# Patient Record
Sex: Female | Born: 1975 | Race: White | Hispanic: No | Smoking: Former smoker
Health system: Southern US, Community
[De-identification: ages and names within clinical notes are randomized; demographics above are authoritative.]

## PROBLEM LIST (undated history)

## (undated) DIAGNOSIS — E785 Hyperlipidemia, unspecified: Secondary | ICD-10-CM

## (undated) DIAGNOSIS — I469 Cardiac arrest, cause unspecified: Secondary | ICD-10-CM

## (undated) DIAGNOSIS — F1910 Other psychoactive substance abuse, uncomplicated: Secondary | ICD-10-CM

## (undated) DIAGNOSIS — F419 Anxiety disorder, unspecified: Secondary | ICD-10-CM

## (undated) DIAGNOSIS — F319 Bipolar disorder, unspecified: Secondary | ICD-10-CM

## (undated) DIAGNOSIS — M1990 Unspecified osteoarthritis, unspecified site: Secondary | ICD-10-CM

## (undated) DIAGNOSIS — F1490 Cocaine use, unspecified, uncomplicated: Secondary | ICD-10-CM

## (undated) DIAGNOSIS — G8929 Other chronic pain: Secondary | ICD-10-CM

## (undated) DIAGNOSIS — G459 Transient cerebral ischemic attack, unspecified: Secondary | ICD-10-CM

## (undated) DIAGNOSIS — F32A Depression, unspecified: Secondary | ICD-10-CM

## (undated) DIAGNOSIS — K219 Gastro-esophageal reflux disease without esophagitis: Secondary | ICD-10-CM

## (undated) DIAGNOSIS — I10 Essential (primary) hypertension: Secondary | ICD-10-CM

## (undated) DIAGNOSIS — M419 Scoliosis, unspecified: Secondary | ICD-10-CM

## (undated) DIAGNOSIS — A498 Other bacterial infections of unspecified site: Secondary | ICD-10-CM

## (undated) DIAGNOSIS — F909 Attention-deficit hyperactivity disorder, unspecified type: Secondary | ICD-10-CM

## (undated) DIAGNOSIS — F329 Major depressive disorder, single episode, unspecified: Secondary | ICD-10-CM

## (undated) DIAGNOSIS — G43909 Migraine, unspecified, not intractable, without status migrainosus: Secondary | ICD-10-CM

## (undated) DIAGNOSIS — Z951 Presence of aortocoronary bypass graft: Secondary | ICD-10-CM

## (undated) DIAGNOSIS — U071 COVID-19: Secondary | ICD-10-CM

## (undated) DIAGNOSIS — Z22322 Carrier or suspected carrier of Methicillin resistant Staphylococcus aureus: Secondary | ICD-10-CM

## (undated) DIAGNOSIS — F603 Borderline personality disorder: Secondary | ICD-10-CM

## (undated) DIAGNOSIS — I2510 Atherosclerotic heart disease of native coronary artery without angina pectoris: Secondary | ICD-10-CM

## (undated) DIAGNOSIS — R570 Cardiogenic shock: Secondary | ICD-10-CM

## (undated) HISTORY — DX: Other chronic pain: G89.29

## (undated) HISTORY — DX: Hyperlipidemia, unspecified: E78.5

## (undated) HISTORY — PX: WISDOM TOOTH EXTRACTION: SHX21

## (undated) HISTORY — DX: Carrier or suspected carrier of methicillin resistant Staphylococcus aureus: Z22.322

## (undated) HISTORY — PX: MULTIPLE TOOTH EXTRACTIONS: SHX2053

## (undated) HISTORY — DX: Other bacterial infections of unspecified site: A49.8

## (undated) HISTORY — DX: Cocaine use, unspecified, uncomplicated: F14.90

## (undated) HISTORY — PX: NOSE SURGERY: SHX723

---

## 2000-03-16 ENCOUNTER — Emergency Department (HOSPITAL_COMMUNITY): Admission: EM | Admit: 2000-03-16 | Discharge: 2000-03-16 | Payer: Self-pay

## 2004-02-29 ENCOUNTER — Emergency Department (HOSPITAL_COMMUNITY): Admission: AD | Admit: 2004-02-29 | Discharge: 2004-02-29 | Payer: Self-pay | Admitting: Family Medicine

## 2004-05-28 ENCOUNTER — Emergency Department (HOSPITAL_COMMUNITY): Admission: EM | Admit: 2004-05-28 | Discharge: 2004-05-28 | Payer: Self-pay | Admitting: Emergency Medicine

## 2005-09-05 ENCOUNTER — Emergency Department (HOSPITAL_COMMUNITY): Admission: EM | Admit: 2005-09-05 | Discharge: 2005-09-05 | Payer: Self-pay | Admitting: *Deleted

## 2006-02-24 ENCOUNTER — Emergency Department (HOSPITAL_COMMUNITY): Admission: EM | Admit: 2006-02-24 | Discharge: 2006-02-24 | Payer: Self-pay | Admitting: Emergency Medicine

## 2006-05-24 ENCOUNTER — Emergency Department (HOSPITAL_COMMUNITY): Admission: EM | Admit: 2006-05-24 | Discharge: 2006-05-24 | Payer: Self-pay | Admitting: Emergency Medicine

## 2007-04-08 ENCOUNTER — Emergency Department (HOSPITAL_COMMUNITY): Admission: EM | Admit: 2007-04-08 | Discharge: 2007-04-08 | Payer: Self-pay | Admitting: Emergency Medicine

## 2008-06-06 ENCOUNTER — Emergency Department (HOSPITAL_COMMUNITY): Admission: EM | Admit: 2008-06-06 | Discharge: 2008-06-06 | Payer: Self-pay | Admitting: Emergency Medicine

## 2008-06-30 ENCOUNTER — Emergency Department (HOSPITAL_COMMUNITY): Admission: EM | Admit: 2008-06-30 | Discharge: 2008-07-01 | Payer: Self-pay | Admitting: Emergency Medicine

## 2008-11-19 ENCOUNTER — Emergency Department (HOSPITAL_COMMUNITY): Admission: EM | Admit: 2008-11-19 | Discharge: 2008-11-19 | Payer: Self-pay | Admitting: Emergency Medicine

## 2010-01-02 ENCOUNTER — Emergency Department (HOSPITAL_COMMUNITY): Admission: EM | Admit: 2010-01-02 | Discharge: 2010-01-02 | Payer: Self-pay | Admitting: Emergency Medicine

## 2010-03-09 ENCOUNTER — Emergency Department (HOSPITAL_COMMUNITY): Admission: EM | Admit: 2010-03-09 | Discharge: 2010-03-09 | Payer: Self-pay | Admitting: Emergency Medicine

## 2010-08-08 ENCOUNTER — Emergency Department (HOSPITAL_COMMUNITY): Admission: EM | Admit: 2010-08-08 | Discharge: 2010-08-08 | Payer: Self-pay | Admitting: Emergency Medicine

## 2010-08-18 ENCOUNTER — Emergency Department (HOSPITAL_COMMUNITY): Admission: EM | Admit: 2010-08-18 | Discharge: 2010-08-18 | Payer: Self-pay | Admitting: Emergency Medicine

## 2011-02-27 LAB — POCT PREGNANCY, URINE: Preg Test, Ur: NEGATIVE

## 2011-02-27 LAB — URINALYSIS, ROUTINE W REFLEX MICROSCOPIC
Ketones, ur: 15 mg/dL — AB
Protein, ur: NEGATIVE mg/dL
Specific Gravity, Urine: 1.022 (ref 1.005–1.030)

## 2011-02-27 LAB — URINE MICROSCOPIC-ADD ON

## 2011-03-09 LAB — URINALYSIS, ROUTINE W REFLEX MICROSCOPIC
Nitrite: NEGATIVE
Urobilinogen, UA: 0.2 mg/dL (ref 0.0–1.0)

## 2011-08-22 ENCOUNTER — Emergency Department (HOSPITAL_COMMUNITY)
Admission: EM | Admit: 2011-08-22 | Discharge: 2011-08-22 | Disposition: A | Discharge status: Home / Self Care | Payer: Self-pay | Attending: Emergency Medicine | Admitting: Emergency Medicine

## 2011-08-22 DIAGNOSIS — M545 Low back pain, unspecified: Secondary | ICD-10-CM | POA: Insufficient documentation

## 2011-08-22 DIAGNOSIS — M1990 Unspecified osteoarthritis, unspecified site: Secondary | ICD-10-CM | POA: Insufficient documentation

## 2011-08-22 DIAGNOSIS — F319 Bipolar disorder, unspecified: Secondary | ICD-10-CM | POA: Insufficient documentation

## 2011-08-22 DIAGNOSIS — S335XXA Sprain of ligaments of lumbar spine, initial encounter: Secondary | ICD-10-CM | POA: Insufficient documentation

## 2011-08-22 DIAGNOSIS — X500XXA Overexertion from strenuous movement or load, initial encounter: Secondary | ICD-10-CM | POA: Insufficient documentation

## 2011-08-22 DIAGNOSIS — Z79899 Other long term (current) drug therapy: Secondary | ICD-10-CM | POA: Insufficient documentation

## 2011-10-12 ENCOUNTER — Emergency Department (HOSPITAL_COMMUNITY): Payer: Self-pay

## 2011-10-12 ENCOUNTER — Emergency Department (HOSPITAL_COMMUNITY)
Admission: EM | Admit: 2011-10-12 | Discharge: 2011-10-12 | Disposition: A | Discharge status: Home / Self Care | Payer: Self-pay | Attending: Emergency Medicine | Admitting: Emergency Medicine

## 2011-10-12 DIAGNOSIS — F319 Bipolar disorder, unspecified: Secondary | ICD-10-CM | POA: Insufficient documentation

## 2011-10-12 DIAGNOSIS — M79609 Pain in unspecified limb: Secondary | ICD-10-CM | POA: Insufficient documentation

## 2011-10-12 DIAGNOSIS — W268XXA Contact with other sharp object(s), not elsewhere classified, initial encounter: Secondary | ICD-10-CM | POA: Insufficient documentation

## 2011-10-12 DIAGNOSIS — W01119A Fall on same level from slipping, tripping and stumbling with subsequent striking against unspecified sharp object, initial encounter: Secondary | ICD-10-CM | POA: Insufficient documentation

## 2011-10-12 DIAGNOSIS — S51809A Unspecified open wound of unspecified forearm, initial encounter: Secondary | ICD-10-CM | POA: Insufficient documentation

## 2011-10-12 DIAGNOSIS — M1990 Unspecified osteoarthritis, unspecified site: Secondary | ICD-10-CM | POA: Insufficient documentation

## 2011-10-22 ENCOUNTER — Emergency Department (HOSPITAL_COMMUNITY)
Admission: EM | Admit: 2011-10-22 | Discharge: 2011-10-22 | Disposition: A | Discharge status: Home / Self Care | Payer: Self-pay | Attending: Emergency Medicine | Admitting: Emergency Medicine

## 2011-10-22 DIAGNOSIS — F341 Dysthymic disorder: Secondary | ICD-10-CM | POA: Insufficient documentation

## 2011-10-22 DIAGNOSIS — F17200 Nicotine dependence, unspecified, uncomplicated: Secondary | ICD-10-CM | POA: Insufficient documentation

## 2011-10-22 DIAGNOSIS — M545 Low back pain, unspecified: Secondary | ICD-10-CM | POA: Insufficient documentation

## 2011-10-22 DIAGNOSIS — Z79899 Other long term (current) drug therapy: Secondary | ICD-10-CM | POA: Insufficient documentation

## 2011-10-22 DIAGNOSIS — F319 Bipolar disorder, unspecified: Secondary | ICD-10-CM | POA: Insufficient documentation

## 2011-10-22 DIAGNOSIS — Z4802 Encounter for removal of sutures: Secondary | ICD-10-CM | POA: Insufficient documentation

## 2011-10-22 DIAGNOSIS — M549 Dorsalgia, unspecified: Secondary | ICD-10-CM

## 2011-10-22 HISTORY — DX: Bipolar disorder, unspecified: F31.9

## 2011-10-22 HISTORY — DX: Anxiety disorder, unspecified: F41.9

## 2011-10-22 HISTORY — DX: Major depressive disorder, single episode, unspecified: F32.9

## 2011-10-22 HISTORY — DX: Depression, unspecified: F32.A

## 2011-10-22 MED ORDER — CYCLOBENZAPRINE HCL 10 MG PO TABS: 5.0000 mg | ORAL_TABLET | Freq: Two times a day (BID) | ORAL | Status: AC | PRN

## 2011-10-22 MED ORDER — IBUPROFEN 200 MG PO TABS
400.0000 mg | ORAL_TABLET | Freq: Once | ORAL | Status: DC
  Filled 2011-10-22: qty 2
  Filled 2011-10-22: qty 1

## 2011-10-22 MED ORDER — CYCLOBENZAPRINE HCL 10 MG PO TABS
5.0000 mg | ORAL_TABLET | Freq: Once | ORAL | Status: AC
  Administered 2011-10-22: 5 mg via ORAL
  Filled 2011-10-22: qty 1

## 2011-10-22 NOTE — ED Notes (Signed)
Here for suture removal and back pain, sts fell 1 week ago Saturday and sutures on right forearm.

## 2011-10-22 NOTE — ED Provider Notes (Signed)
History     CSN: [id number] Arrival date & time: 10/22/2011  9:43 AM   First MD Initiated Contact with Patient 10/22/11 1123      Chief Complaint  Patient presents with  . Suture / Staple Removal  . Back Pain   Complains of low nonradiating back pain onset 1.5 weeks ago after falling down steps patient was seen here for same complaint and had right forearm sutured as result of fall. Prescribe Flexeril, which she has run out of. has also been taking Tylenol . She presents today with continued low back pain and requesting suture removal from right forearm. Back pain nonradiating worse with movement no other complaint (Consider location/radiation/quality/duration/timing/severity/associated sxs/prior treatment) HPI  Past Medical History  Diagnosis Date  . Depression   . Anxiety   . Bipolar 1 disorder     History reviewed. No pertinent past surgical history.  History reviewed. No pertinent family history.  History  Substance Use Topics  . Smoking status: Current Everyday Smoker -- 1.0 packs/day    Types: Cigarettes  . Smokeless tobacco: Not on file  . Alcohol Use: Yes     once a month    OB History    Grav Para Term Preterm Abortions TAB SAB Ect Mult Living                  Review of Systems  Constitutional: Negative.   HENT: Negative.   Respiratory: Negative.   Cardiovascular: Negative.   Gastrointestinal: Negative.   Musculoskeletal: Positive for back pain.  Skin:       Laceration right forearm  Neurological: Negative.   Hematological: Negative.   Psychiatric/Behavioral: Negative.     Allergies  Review of patient's allergies indicates no known allergies.  Home Medications   Current Outpatient Rx  Name Route Sig Dispense Refill  . ARIPIPRAZOLE 10 MG PO TABS Oral Take 10 mg by mouth daily.      [name] DIAZEPAM 5 MG PO TABS Oral Take 2.5 mg by mouth 2 (two) times daily as needed. For anxiety       BP 113/80  Pulse 66  Temp(Src) 97.8 F (36.6 C) (Oral)   Resp 14  SpO2 100%  LMP 10/15/2011  Physical Exam  Nursing note and vitals reviewed. Constitutional: She appears well-developed and well-nourished.  HENT:  Head: Normocephalic and atraumatic.  Eyes: Conjunctivae are normal. Pupils are equal, round, and reactive to light.  Neck: Normal range of motion. Neck supple. No tracheal deviation present. No thyromegaly present.  Cardiovascular: Normal rate and regular rhythm.   No murmur heard. Pulmonary/Chest: Effort normal and breath sounds normal.  Abdominal: Soft. Bowel sounds are normal. She exhibits no distension. There is no tenderness.  Musculoskeletal: Normal range of motion. She exhibits no edema and no tenderness.       Entire spine nontender  Neurological: She is alert. Coordination normal.       Gait normal  Skin: Skin is warm and dry. No rash noted.       Sutured laceration right volar forearm no surrounding redness tenderness or discharge from wound.  Psychiatric: She has a normal mood and affect.    ED Course  Procedures (including critical care time)  Labs Reviewed - No data to display No results found.   No diagnosis found.    MDM  No signs of infection. Plan prescription for Flexeril Tylenol or ibuprofen for pain . Narcotic pain prescription not written due to possible interaction benzodiazepine and alcohol and potential for  abuse. Sutures to remove be removed by nurse   Dx#1 back pain #2 well-healing laceration right forearm   Doug Sou, MD 10/22/11 1134

## 2012-05-17 ENCOUNTER — Emergency Department (HOSPITAL_COMMUNITY)
Admission: EM | Admit: 2012-05-17 | Discharge: 2012-05-17 | Disposition: A | Discharge status: Home / Self Care | Payer: Self-pay | Attending: Emergency Medicine | Admitting: Emergency Medicine

## 2012-05-17 ENCOUNTER — Encounter (HOSPITAL_COMMUNITY): Payer: Self-pay | Admitting: Emergency Medicine

## 2012-05-17 DIAGNOSIS — M545 Low back pain, unspecified: Secondary | ICD-10-CM | POA: Insufficient documentation

## 2012-05-17 DIAGNOSIS — M4120 Other idiopathic scoliosis, site unspecified: Secondary | ICD-10-CM | POA: Insufficient documentation

## 2012-05-17 DIAGNOSIS — F411 Generalized anxiety disorder: Secondary | ICD-10-CM | POA: Insufficient documentation

## 2012-05-17 DIAGNOSIS — F17200 Nicotine dependence, unspecified, uncomplicated: Secondary | ICD-10-CM | POA: Insufficient documentation

## 2012-05-17 DIAGNOSIS — F319 Bipolar disorder, unspecified: Secondary | ICD-10-CM | POA: Insufficient documentation

## 2012-05-17 HISTORY — DX: Scoliosis, unspecified: M41.9

## 2012-05-17 MED ORDER — HYDROCODONE-ACETAMINOPHEN 5-325 MG PO TABS: 1.0000 | ORAL_TABLET | ORAL | Status: AC | PRN

## 2012-05-17 MED ORDER — METHOCARBAMOL 500 MG PO TABS
1000.0000 mg | ORAL_TABLET | Freq: Once | ORAL | Status: AC
  Administered 2012-05-17: 1000 mg via ORAL
  Filled 2012-05-17: qty 2

## 2012-05-17 MED ORDER — IBUPROFEN 600 MG PO TABS: 600.0000 mg | ORAL_TABLET | Freq: Four times a day (QID) | ORAL | Status: AC | PRN

## 2012-05-17 MED ORDER — IBUPROFEN 200 MG PO TABS
600.0000 mg | ORAL_TABLET | Freq: Once | ORAL | Status: AC
  Administered 2012-05-17: 600 mg via ORAL
  Filled 2012-05-17: qty 3

## 2012-05-17 MED ORDER — HYDROCODONE-ACETAMINOPHEN 5-325 MG PO TABS
1.0000 | ORAL_TABLET | Freq: Once | ORAL | Status: AC
  Administered 2012-05-17: 1 via ORAL
  Filled 2012-05-17: qty 1

## 2012-05-17 MED ORDER — METHOCARBAMOL 500 MG PO TABS: 500.0000 mg | ORAL_TABLET | Freq: Two times a day (BID) | ORAL | Status: AC

## 2012-05-17 NOTE — ED Provider Notes (Signed)
History  This chart was scribed for Christina Racer, MD by Bennett Scrape. This patient was seen in room STRE7/STRE7 and the patient's care was started at 2:28PM.  CSN: [id number]  Arrival date & time 05/17/12  1427   First MD Initiated Contact with Patient 05/17/12 1448      Chief Complaint  Patient presents with  . Back Pain    Patient is a 36 y.o. female presenting with back pain. The history is provided by the patient. No language interpreter was used.  Back Pain  This is a recurrent problem. The current episode started more than 2 days ago. The problem occurs constantly. The problem has been gradually worsening. The pain is associated with no known injury. The pain is present in the lumbar spine. The quality of the pain is described as shooting. Pertinent negatives include no fever, no headaches and no weakness.    Christina Morrison is a 36 y.o. female with a h/o depression, anxiety, bipolar disorder and scoliosis who presents to the Emergency Department complaining of 4 days of gradual onset, gradually worsening, constant lower back pain that occasionally radiates shooting pains up her back that started after she cleaned her house. She denies injury as the cause. She reports having prior episodes of similar symptoms. She reports taking Aleve at home with no improvement in her symptoms. She denies urinary symptoms. She denies loss of bladder or bowels as associated symptoms. She is a current everyday smoker and rare alcohol user.   Past Medical History  Diagnosis Date  . Depression   . Anxiety   . Bipolar 1 disorder   . Scoliosis     History reviewed. No pertinent past surgical history.  No family history on file.  History  Substance Use Topics  . Smoking status: Current Everyday Smoker -- 1.0 packs/day    Types: Cigarettes  . Smokeless tobacco: Not on file  . Alcohol Use: No     once a month     Review of Systems  Constitutional: Negative for fever and chills.    Respiratory: Negative for cough and shortness of breath.   Gastrointestinal: Negative for nausea and vomiting.  Musculoskeletal: Positive for back pain.  Neurological: Negative for weakness and headaches.    Allergies  Review of patient's allergies indicates no known allergies.  Home Medications   Current Outpatient Rx  Name Route Sig Dispense Refill  . ARIPIPRAZOLE 10 MG PO TABS Oral Take 10 mg by mouth daily.      [name] HYDROCODONE-ACETAMINOPHEN 5-325 MG PO TABS Oral Take 1 tablet by mouth every 4 (four) hours as needed for pain. 10 tablet 0  . IBUPROFEN 600 MG PO TABS Oral Take 1 tablet (600 mg total) by mouth every 6 (six) hours as needed for pain. 30 tablet 0  . METHOCARBAMOL 500 MG PO TABS Oral Take 1 tablet (500 mg total) by mouth 2 (two) times daily. 20 tablet 0    Triage Vitals: BP 123/84  Pulse 96  Temp(Src) 98 F (36.7 C) (Oral)  Resp 18  SpO2 100%  Physical Exam  Nursing note and vitals reviewed. Constitutional: She is oriented to person, place, and time. She appears well-developed and well-nourished. No distress.  HENT:  Head: Normocephalic and atraumatic.  Eyes: EOM are normal.  Neck: Neck supple. No tracheal deviation present.  Cardiovascular: Normal rate.   Pulmonary/Chest: Effort normal. No respiratory distress.  Musculoskeletal: Normal range of motion. She exhibits tenderness (mild lumbar paraspinal tenderness).  No motor deficits, normal strength  Neurological: She is alert and oriented to person, place, and time.  Skin: Skin is warm and dry.  Psychiatric: She has a normal mood and affect. Her behavior is normal.    ED Course  Procedures (including critical care time)  DIAGNOSTIC STUDIES: Oxygen Saturation is 100% on room air, normal by my interpretation.    COORDINATION OF CARE: 3:00PM-Discussed treatment plan with pt and pt agreed to plan. 3:54PM-Pt rechecked and is feeling better. Discussed discharged plan with pt and pt agreed to  plan.   Labs Reviewed - No data to display No results found.   1. Lumbar back pain       MDM  I personally performed the services described in this documentation, which was scribed in my presence. The recorded information has been reviewed and considered.        Christina Racer, MD 05/17/12 1924

## 2012-05-17 NOTE — Discharge Instructions (Signed)
Back Pain, Adult Low back pain is very common. About 1 in 5 people have back pain.The cause of low back pain is rarely dangerous. The pain often gets better over time.About half of people with a sudden onset of back pain feel better in just 2 weeks. About 8 in 10 people feel better by 6 weeks.  CAUSES Some common causes of back pain include:  Strain of the muscles or ligaments supporting the spine.   Wear and tear (degeneration) of the spinal discs.   Arthritis.   Direct injury to the back.  DIAGNOSIS Most of the time, the direct cause of low back pain is not known.However, back pain can be treated effectively even when the exact cause of the pain is unknown.Answering your caregiver's questions about your overall health and symptoms is one of the most accurate ways to make sure the cause of your pain is not dangerous. If your caregiver needs more information, he or she may order lab work or imaging tests (X-rays or MRIs).However, even if imaging tests show changes in your back, this usually does not require surgery. HOME CARE INSTRUCTIONS For many people, back pain returns.Since low back pain is rarely dangerous, it is often a condition that people can learn to manageon their own.   Remain active. It is stressful on the back to sit or stand in one place. Do not sit, drive, or stand in one place for more than 30 minutes at a time. Take short walks on level surfaces as soon as pain allows.Try to increase the length of time you walk each day.   Do not stay in bed.Resting more than 1 or 2 days can delay your recovery.   Do not avoid exercise or work.Your body is made to move.It is not dangerous to be active, even though your back may hurt.Your back will likely heal faster if you return to being active before your pain is gone.   Pay attention to your body when you bend and lift. Many people have less discomfortwhen lifting if they bend their knees, keep the load close to their  bodies,and avoid twisting. Often, the most comfortable positions are those that put less stress on your recovering back.   Find a comfortable position to sleep. Use a firm mattress and lie on your side with your knees slightly bent. If you lie on your back, put a pillow under your knees.   Only take over-the-counter or prescription medicines as directed by your caregiver. Over-the-counter medicines to reduce pain and inflammation are often the most helpful.Your caregiver may prescribe muscle relaxant drugs.These medicines help dull your pain so you can more quickly return to your normal activities and healthy exercise.   Put ice on the injured area.   Put ice in a plastic bag.   Place a towel between your skin and the bag.   Leave the ice on for 15 to 20 minutes, 3 to 4 times a day for the first 2 to 3 days. After that, ice and heat may be alternated to reduce pain and spasms.   Ask your caregiver about trying back exercises and gentle massage. This may be of some benefit.   Avoid feeling anxious or stressed.Stress increases muscle tension and can worsen back pain.It is important to recognize when you are anxious or stressed and learn ways to manage it.Exercise is a great option.  SEEK MEDICAL CARE IF:  You have pain that is not relieved with rest or medicine.   You have   pain that does not improve in 1 week.   You have new symptoms.   You are generally not feeling well.  SEEK IMMEDIATE MEDICAL CARE IF:   You have pain that radiates from your back into your legs.   You develop new bowel or bladder control problems.   You have unusual weakness or numbness in your arms or legs.   You develop nausea or vomiting.   You develop abdominal pain.   You feel faint.  Document Released: 12/01/2005 Document Revised: 11/20/2011 Document Reviewed: 04/21/2011 [employer] Patient Information 2012 [employer]. 

## 2012-05-17 NOTE — ED Notes (Signed)
Onset 4 days ago lower back pain history of back pain. Denies any trauma.  Pain currently 9/10 achy sharp pain intermittent nausea.

## 2012-05-29 ENCOUNTER — Emergency Department (HOSPITAL_COMMUNITY)
Admission: EM | Admit: 2012-05-29 | Discharge: 2012-05-30 | Disposition: A | Discharge status: Home / Self Care | Payer: Self-pay | Attending: Emergency Medicine | Admitting: Emergency Medicine

## 2012-05-29 DIAGNOSIS — F319 Bipolar disorder, unspecified: Secondary | ICD-10-CM | POA: Insufficient documentation

## 2012-05-29 DIAGNOSIS — F411 Generalized anxiety disorder: Secondary | ICD-10-CM | POA: Insufficient documentation

## 2012-05-29 DIAGNOSIS — R51 Headache: Secondary | ICD-10-CM | POA: Insufficient documentation

## 2012-05-29 DIAGNOSIS — F17200 Nicotine dependence, unspecified, uncomplicated: Secondary | ICD-10-CM | POA: Insufficient documentation

## 2012-05-30 ENCOUNTER — Encounter (HOSPITAL_COMMUNITY): Payer: Self-pay | Admitting: Emergency Medicine

## 2012-05-30 MED ORDER — NAPROXEN 500 MG PO TABS: 500.0000 mg | ORAL_TABLET | Freq: Two times a day (BID) | ORAL | Status: DC

## 2012-05-30 MED ORDER — ONDANSETRON 4 MG PO TBDP
ORAL_TABLET | ORAL | Status: AC
  Administered 2012-05-30: 4 mg
  Filled 2012-05-30: qty 1

## 2012-05-30 MED ORDER — OXYCODONE-ACETAMINOPHEN 5-325 MG PO TABS
2.0000 | ORAL_TABLET | Freq: Once | ORAL | Status: AC
  Administered 2012-05-30: 2 via ORAL
  Filled 2012-05-30: qty 2

## 2012-05-30 MED ORDER — OXYCODONE-ACETAMINOPHEN 5-325 MG PO TABS: 1.0000 | ORAL_TABLET | ORAL | Status: DC | PRN

## 2012-05-30 NOTE — ED Provider Notes (Signed)
History     CSN: [id number]  Arrival date & time 05/29/12  2345   First MD Initiated Contact with Patient 05/30/12 0044      Chief Complaint  Patient presents with  . Headache    (Consider location/radiation/quality/duration/timing/severity/associated sxs/prior treatment) HPI Comments: Patient is a 36 year old female who presents with a headache that has been ongoing for 4 days. She states it is a pressure sensation in a bandlike distribution across her forehead and bitemporal area. The pain radiates across to the back of her head and down in her neck. She has no pain with range of motion of her neck, no fevers, no changes in vision, no numbness, weakness, ataxia. She does state that the headache is intermittent, is not responding to ibuprofen at home. She does not usually get headaches. She denies coughing, fever, shortness of breath, chest pain, belly pain, dysuria though she does note having diarrhea and myalgias yesterday which resolved. Currently her headache is moderate and seems to get worse when she stands up.  Patient is a 36 y.o. female presenting with headaches. The history is provided by the patient and the spouse.  Headache     Past Medical History  Diagnosis Date  . Depression   . Anxiety   . Bipolar 1 disorder   . Scoliosis     History reviewed. No pertinent past surgical history.  History reviewed. No pertinent family history.  History  Substance Use Topics  . Smoking status: Current Everyday Smoker -- 1.0 packs/day    Types: Cigarettes  . Smokeless tobacco: Not on file  . Alcohol Use: No     once a month    OB History    Grav Para Term Preterm Abortions TAB SAB Ect Mult Living                  Review of Systems  Neurological: Positive for headaches.  All other systems reviewed and are negative.    Allergies  Review of patient's allergies indicates no known allergies.  Home Medications   Current Outpatient Rx  Name Route Sig Dispense Refill   . IBUPROFEN 200 MG PO TABS Oral Take 400 mg by mouth every 6 (six) hours as needed. Pain or fever    . NAPROXEN 500 MG PO TABS Oral Take 1 tablet (500 mg total) by mouth 2 (two) times daily with a meal. 30 tablet 0  . OXYCODONE-ACETAMINOPHEN 5-325 MG PO TABS Oral Take 1 tablet by mouth every 4 (four) hours as needed for pain. May take 2 tablets PO q 6 hours for severe pain - Do not take with Tylenol as this tablet already contains tylenol 15 tablet 0    BP 138/80  Pulse 85  Temp 98.3 F (36.8 C) (Oral)  Resp 18  SpO2 100%  LMP 04/14/2012  Physical Exam  Nursing note and vitals reviewed. Constitutional: She appears well-developed and well-nourished. No distress.  HENT:  Head: Normocephalic and atraumatic.  Mouth/Throat: Oropharynx is clear and moist. No oropharyngeal exudate.  Eyes: Conjunctivae and EOM are normal. Pupils are equal, round, and reactive to light. Right eye exhibits no discharge. Left eye exhibits no discharge. No scleral icterus.  Neck: Normal range of motion. Neck supple. No JVD present. No thyromegaly present.  Cardiovascular: Normal rate, regular rhythm, normal heart sounds and intact distal pulses.  Exam reveals no gallop and no friction rub.   No murmur heard. Pulmonary/Chest: Effort normal and breath sounds normal. No respiratory distress. She has no wheezes.  She has no rales.  Abdominal: Soft. Bowel sounds are normal. She exhibits no distension and no mass. There is no tenderness.  Musculoskeletal: Normal range of motion. She exhibits no edema and no tenderness.  Lymphadenopathy:    She has no cervical adenopathy.  Neurological: She is alert. Coordination normal.       Neurologic exam:  Speech clear, pupils equal round reactive to light, extraocular movements intact  Normal peripheral visual fields Cranial nerves III through XII normal including no facial droop Follows commands, moves all extremities x4, normal strength to bilateral upper and lower  extremities at all major muscle groups including grip Sensation normal to light touch and pinprick Coordination intact, no limb ataxia, finger-nose-finger normal Rapid alternating movements normal No pronator drift Gait normal   Skin: Skin is warm and dry. No rash noted. No erythema.  Psychiatric: She has a normal mood and affect. Her behavior is normal.    ED Course  Procedures (including critical care time)  Labs Reviewed - No data to display No results found.   1. Headache       MDM  Physical exam is benign, patient has a headache consistent with tension headache, there is no focal neurologic deficits, no fever, no stiff neck, doubt significant secondary source of headache, likely primary tension headache. Patient has declined IV and intramuscular medications but has accepted oral medications, will give anti-inflammatory, Percocet, home with NSAIDs.  Discharge Prescriptions include:  Naprosyn Percocet         Vida Roller, MD 05/30/12 [phone]

## 2012-05-30 NOTE — ED Notes (Signed)
Pt denies any questions upon discharge, pt medicated from pain prior to discharge.

## 2012-05-30 NOTE — Discharge Instructions (Signed)
Headache:  You are having a headache. No specific cause was found today for your headache. It may have been a migraine or other cause of headache. Stress, anxiety, fatigue, and depression are common triggers for headaches. Your headache today does not appear to be life-threatening or require hospitalization, but often the exact cause of headaches is not determined in the emergency department. Therefore, followup with your doctor is very important to find out what may have caused your headache, and whether or not you need any further diagnostic testing or treatment. Sometimes headaches can appear benign but then more serious symptoms can develop which should prompt an immediate reevaluation by your doctor or the emergency department.  Seek immediate medical attention if:  You develop possible problems with medications prescribed. The medications don't resolve your headache, if it recurs, or if you have multiple episodes of vomiting or can't take fluids by mouth You have a change from the usual headache. If you developed a sudden severe headache or confusion, become poorly responsive or faint, developed a fever above 100.4 or problems breathing, have a change in speech, vision, swallowing or understanding, or developed new weakness, numbness, tingling, incoordination or have a seizure.  If you don't have a family doctor to follow up with, see the follow up list below - call this morning for a follow-up appointment in the next 1-2 days.  RESOURCE GUIDE  Dental Problems  Patients with Medicaid: [facility]                     [facility] [address]                                           [address] Phone:  [phone]                                                  Phone:  [phone]  If unable to pay or uninsured, contact:  [facility] or [facility] to become qualified for the adult dental clinic.  Chronic Pain Problems Contact [facility]  [phone] Patients need to be referred by their primary care doctor.  Insufficient Money for Medicine Contact [employer]:  call "211" or [facility] 271-5999.  No Primary Care Doctor Call [facility]  832-8000 Other agencies that provide inexpensive medical care    [facility]  832-8035    [facility]  832-7272    [facility]  [phone]    Women's Clinic  832-4777    [facility]  [phone]    [facility]  272-1050  Psychological Services [facility]  [phone] [facility]  [phone] [facility]   800 853-5163 (emergency services 641-4993)  Substance Abuse Resources [facility]  336-882-2125 [facility] 336-784-9470 [facility] 336-285-9073 [facility] 336-845-3988 Residential & Outpatient Substance Abuse Program  800-659-3381  Abuse/Neglect [place] Child Abuse Hotline (336) 641-3795 [place] Child Abuse Hotline 800-378-5315 (After Hours)  Emergency Shelter [facility] (336) 271-5985  [facility] (336) 275-9566 [facility] (704) 372-4663  MRSA Hotline #:     832-7006    [place] Resources  [facility] [place]     [employer]                          [facility] [address] [place]                       [address]      [address]  [place]                                                [place]                            [place] Phone:  [phone]                                   Phone:  [phone]                 Phone:  [phone]  [facility] Phone:  342-8316  [place] Child Abuse Hotline (336) 342-1394 (336) 342-3537 (After Hours)   

## 2012-05-30 NOTE — ED Notes (Addendum)
Patient complaining of headache; reports dizziness and nausea.  Took blood pressure before coming to ED -- 141/106.  Denies light sensitivity and blurred vision.  Patient denies weakness, shortness of breath, and chest pain.

## 2012-05-31 ENCOUNTER — Emergency Department (HOSPITAL_COMMUNITY)
Admission: EM | Admit: 2012-05-31 | Discharge: 2012-06-01 | Disposition: A | Discharge status: Home / Self Care | Payer: Self-pay | Attending: Emergency Medicine | Admitting: Emergency Medicine

## 2012-05-31 ENCOUNTER — Encounter (HOSPITAL_COMMUNITY): Payer: Self-pay | Admitting: Emergency Medicine

## 2012-05-31 DIAGNOSIS — F17200 Nicotine dependence, unspecified, uncomplicated: Secondary | ICD-10-CM | POA: Insufficient documentation

## 2012-05-31 DIAGNOSIS — F319 Bipolar disorder, unspecified: Secondary | ICD-10-CM | POA: Insufficient documentation

## 2012-05-31 DIAGNOSIS — M4120 Other idiopathic scoliosis, site unspecified: Secondary | ICD-10-CM | POA: Insufficient documentation

## 2012-05-31 DIAGNOSIS — G44209 Tension-type headache, unspecified, not intractable: Secondary | ICD-10-CM | POA: Insufficient documentation

## 2012-05-31 HISTORY — DX: Migraine, unspecified, not intractable, without status migrainosus: G43.909

## 2012-05-31 NOTE — ED Notes (Signed)
PT. REPORTS PERSISTENT HEADACHE SEEN HERE 2 DAYS AGO DIAGNOSED WITH " TENSION MIGRAINE" PRESCRIBED WITH PERCOCET AND NAPROXEN WITH NO RELIEF , PT. ALSO REPORTS NAUSEA/VOMITTING .

## 2012-06-01 MED ORDER — HYDROMORPHONE HCL PF 1 MG/ML IJ SOLN
1.0000 mg | Freq: Once | INTRAMUSCULAR | Status: AC
  Administered 2012-06-01: 1 mg via INTRAMUSCULAR
  Filled 2012-06-01: qty 1

## 2012-06-01 MED ORDER — DIAZEPAM 5 MG/ML IJ SOLN
5.0000 mg | Freq: Once | INTRAMUSCULAR | Status: AC
  Administered 2012-06-01: 5 mg via INTRAMUSCULAR
  Filled 2012-06-01: qty 2

## 2012-06-01 MED ORDER — OXYCODONE-ACETAMINOPHEN 5-325 MG PO TABS: 1.0000 | ORAL_TABLET | ORAL | Status: AC | PRN

## 2012-06-01 MED ORDER — PROMETHAZINE HCL 25 MG PO TABS: 25.0000 mg | ORAL_TABLET | Freq: Four times a day (QID) | ORAL | Status: DC | PRN

## 2012-06-01 NOTE — ED Provider Notes (Signed)
History     CSN: [id number]  Arrival date & time 05/31/12  2236   First MD Initiated Contact with Patient 06/01/12 0103      Chief Complaint  Patient presents with  . Migraine    (Consider location/radiation/quality/duration/timing/severity/associated sxs/prior treatment) HPI Comments: Patient returns tonight after having been seen several days ago for headache and having been diagnosed with tension migraine headache - she reports has been taking the medication without improvement in symptoms - she states that she was offered IM medications intially when seen and she refused but she would like to try this now - states no change in the pain, denies fever, chills, neck pain, reports occipital pain with photophobia and nausea without vomiting - denies numbness, tingling, loss of control of bowels or bladder.  Patient is a 36 y.o. female presenting with migraine. The history is provided by the patient. No language interpreter was used.  Migraine This is a recurrent problem. The current episode started in the past 7 days. The problem occurs constantly. The problem has been unchanged. Associated symptoms include headaches and nausea. Pertinent negatives include no abdominal pain, anorexia, arthralgias, change in bowel habit, chest pain, chills, congestion, coughing, diaphoresis, fatigue, joint swelling, myalgias, neck pain, numbness, rash, sore throat, swollen glands, urinary symptoms, vertigo, visual change, vomiting or weakness. The symptoms are aggravated by bending. She has tried nothing for the symptoms. The treatment provided no relief.    Past Medical History  Diagnosis Date  . Depression   . Anxiety   . Bipolar 1 disorder   . Scoliosis   . Migraine     History reviewed. No pertinent past surgical history.  No family history on file.  History  Substance Use Topics  . Smoking status: Current Everyday Smoker -- 1.0 packs/day    Types: Cigarettes  . Smokeless tobacco: Not on file   . Alcohol Use: No     once a month    OB History    Grav Para Term Preterm Abortions TAB SAB Ect Mult Living                  Review of Systems  Constitutional: Negative for chills, diaphoresis and fatigue.  HENT: Negative for congestion, sore throat and neck pain.   Respiratory: Negative for cough.   Cardiovascular: Negative for chest pain.  Gastrointestinal: Positive for nausea. Negative for vomiting, abdominal pain, anorexia and change in bowel habit.  Musculoskeletal: Negative for myalgias, joint swelling and arthralgias.  Skin: Negative for rash.  Neurological: Positive for headaches. Negative for vertigo, weakness and numbness.  All other systems reviewed and are negative.    Allergies  Review of patient's allergies indicates no known allergies.  Home Medications   Current Outpatient Rx  Name Route Sig Dispense Refill  . IBUPROFEN 200 MG PO TABS Oral Take 400 mg by mouth every 6 (six) hours as needed. Pain or fever    . NAPROXEN 500 MG PO TABS Oral Take 1 tablet (500 mg total) by mouth 2 (two) times daily with a meal. 30 tablet 0  . OXYCODONE-ACETAMINOPHEN 5-325 MG PO TABS Oral Take 1 tablet by mouth every 4 (four) hours as needed for pain. May take 2 tablets PO q 6 hours for severe pain - Do not take with Tylenol as this tablet already contains tylenol 15 tablet 0    BP 130/77  Temp 98.3 F (36.8 C) (Oral)  Resp 18  SpO2 100%  LMP 05/31/2012  Physical Exam  Nursing  note and vitals reviewed. Constitutional: She is oriented to person, place, and time. She appears well-developed and well-nourished. No distress.  HENT:  Head: Normocephalic and atraumatic.  Right Ear: External ear normal.  Left Ear: External ear normal.  Nose: Nose normal.  Mouth/Throat: Oropharynx is clear and moist. No oropharyngeal exudate.       Mild occipital scalp ttp  Eyes: Conjunctivae are normal. Pupils are equal, round, and reactive to light. No scleral icterus.  Neck: Normal range  of motion. Neck supple. No spinous process tenderness and no muscular tenderness present.  Cardiovascular: Normal rate, regular rhythm and normal heart sounds.  Exam reveals no gallop and no friction rub.   No murmur heard. Pulmonary/Chest: Breath sounds normal. No respiratory distress. She has no wheezes. She has no rales. She exhibits no tenderness.  Abdominal: Soft. Bowel sounds are normal. She exhibits no distension. There is no tenderness.  Musculoskeletal: Normal range of motion. She exhibits no edema and no tenderness.  Lymphadenopathy:    She has no cervical adenopathy.  Neurological: She is alert and oriented to person, place, and time. No cranial nerve deficit. She exhibits normal muscle tone. Coordination normal.       Normal coordination, gait, strength.  Skin: Skin is warm and dry. No rash noted. No erythema. No pallor.  Psychiatric: She has a normal mood and affect. Her behavior is normal. Judgment and thought content normal.    ED Course  Procedures (including critical care time)  Labs Reviewed - No data to display No results found.   Tension headache    MDM  Patient returns with worsening tension type headache - reports complete relief of pain after dilaudid and valium - will refill pain medication and give pheneregan as well.        Izola Price [place], [place] 06/01/12 [phone]

## 2012-06-01 NOTE — ED Notes (Signed)
Pt ambulated with a steady gait;VSS; A&Ox3; no signs of distress; respirations even and unlabored; skin warm and dry; no questions at this time.  

## 2012-06-01 NOTE — ED Provider Notes (Signed)
Medical screening examination/treatment/procedure(s) were performed by non-physician practitioner and as supervising physician I was immediately available for consultation/collaboration.    Vida Roller, MD 06/01/12 [phone]

## 2012-06-01 NOTE — Discharge Instructions (Signed)
Tension Headache (Muscle Contraction Headache) Tension headache is one of the most common causes of head pain. These headaches are usually felt as a pain over the top of your head and back of your neck. Stress, anxiety, and depression are common triggers for these headaches. Tension headaches are not life-threatening and will not lead to other types of headaches. Tension headaches can often be diagnosed by taking a history from the patient and a physical exam. Sometimes, further lab and x-ray studies are used to confirm the diagnosis. Your caregiver can advise you on how to get help solving problems that cause anxiety or stress. Antidepressants can be prescribed if depression is a problem. HOME CARE INSTRUCTIONS   If testing was done, call for your results. Remember, it is your responsibility to get the results of all testing. Do not assume everything is fine because you do not hear from your caregiver.   Only take over-the-counter or prescription medicines for pain, discomfort, or fever as directed by your caregiver.   Biofeedback, massage, or other relaxation techniques may be helpful.   Ice packs or heat to the head and neck can be used. Use these three to four times per day or as needed.   Physical therapy may be a useful addition to treatment.   If headaches continue, even with therapy, you may need to think about lifestyle changes.   Avoid excessive use of pain killers, as rebound headaches can occur.  SEEK MEDICAL CARE IF:   You develop problems with medications prescribed.   You do not respond or get no relief from medications.   You have a change from the usual headache.   You develop nausea (feeling sick to your stomach) or vomiting.  SEEK IMMEDIATE MEDICAL CARE IF:   Your headache becomes severe.   You have an unexplained oral temperature above 102 F (38.9 C).   You develop a stiff neck.   You have loss of vision.   You have muscular weakness.   You have loss of  muscular control.   You develop severe symptoms different from your first symptoms.   You start losing your balance or have trouble walking.   You feel faint or pass out.  MAKE SURE YOU:   Understand these instructions.   Will watch your condition.   Will get help right away if you are not doing well or get worse.  Document Released: 12/01/2005 Document Revised: 11/20/2011 Document Reviewed: 07/20/2008 [employer] Patient Information 2012 [employer]. 

## 2012-09-19 ENCOUNTER — Emergency Department (HOSPITAL_COMMUNITY): Payer: Self-pay

## 2012-09-19 ENCOUNTER — Encounter (HOSPITAL_COMMUNITY): Payer: Self-pay | Admitting: Emergency Medicine

## 2012-09-19 ENCOUNTER — Emergency Department (HOSPITAL_COMMUNITY)
Admission: EM | Admit: 2012-09-19 | Discharge: 2012-09-19 | Disposition: A | Discharge status: Home / Self Care | Payer: Self-pay | Attending: Emergency Medicine | Admitting: Emergency Medicine

## 2012-09-19 DIAGNOSIS — F17200 Nicotine dependence, unspecified, uncomplicated: Secondary | ICD-10-CM | POA: Insufficient documentation

## 2012-09-19 DIAGNOSIS — S62306A Unspecified fracture of fifth metacarpal bone, right hand, initial encounter for closed fracture: Secondary | ICD-10-CM

## 2012-09-19 DIAGNOSIS — S62309A Unspecified fracture of unspecified metacarpal bone, initial encounter for closed fracture: Secondary | ICD-10-CM | POA: Insufficient documentation

## 2012-09-19 DIAGNOSIS — W2209XA Striking against other stationary object, initial encounter: Secondary | ICD-10-CM | POA: Insufficient documentation

## 2012-09-19 MED ORDER — OXYCODONE-ACETAMINOPHEN 5-325 MG PO TABS: 2.0000 | ORAL_TABLET | Freq: Four times a day (QID) | ORAL | Status: DC | PRN

## 2012-09-19 MED ORDER — OXYCODONE-ACETAMINOPHEN 5-325 MG PO TABS
2.0000 | ORAL_TABLET | Freq: Once | ORAL | Status: AC
  Administered 2012-09-19: 2 via ORAL
  Filled 2012-09-19: qty 2

## 2012-09-19 NOTE — ED Provider Notes (Signed)
History  This chart was scribed for Hurman Horn, MD by Erskine Emery. This patient was seen in room TR04C/TR04C and the patient's care was started at 19:44.   CSN: [id number]  Arrival date & time 09/19/12  1731   First MD Initiated Contact with Patient 09/19/12 1944      Chief Complaint  Patient presents with  . Hand Pain    right    (Consider location/radiation/quality/duration/timing/severity/associated sxs/prior treatment) The history is provided by the patient. No language interpreter was used.  Christina Morrison is a 36 y.o. female who presents to the Emergency Department complaining of right hand pain and swelling since a punching a wall 5 days ago. Pt denies any associated pain in the collar bone, shoulder, or elbow. Pt is right handed and has no h/o issues with that hand. Pt is otherwise healthy.  Pt has no orthopedic surgeon.   Past Medical History  Diagnosis Date  . Depression   . Anxiety   . Bipolar 1 disorder   . Scoliosis   . Migraine     History reviewed. No pertinent past surgical history.  No family history on file.  History  Substance Use Topics  . Smoking status: Current Every Day Smoker -- 1.0 packs/day    Types: Cigarettes  . Smokeless tobacco: Not on file  . Alcohol Use: No     once a month    OB History    Grav Para Term Preterm Abortions TAB SAB Ect Mult Living                  Review of Systems  10 Systems reviewed and are negative for acute change except as noted in the HPI.   Allergies  Review of patient's allergies indicates no known allergies.  Home Medications   Current Outpatient Rx  Name Route Sig Dispense Refill  . OXYCODONE-ACETAMINOPHEN 5-325 MG PO TABS Oral Take 2 tablets by mouth every 6 (six) hours as needed for pain. 20 tablet 0    LMP 08/17/2012  Physical Exam  Nursing note and vitals reviewed. Constitutional:       Awake, alert, nontoxic appearance.  HENT:  Head: Atraumatic.  Eyes: Right eye exhibits no  discharge. Left eye exhibits no discharge.  Neck: Neck supple.  Pulmonary/Chest: Effort normal. She exhibits no tenderness.  Abdominal: Soft. There is no tenderness. There is no rebound.  Musculoskeletal: She exhibits no tenderness.       Right arm: no tenderness to elbow, shoulder, or wrist. Right hand: CR less than 2 seconds in all digits. Full extension and only slightly decreased flexion. Isloated tenderness over 5th metacarpal with localized swelling and bruising. No tenderness in wrist over anatomic snuff box or lunate. No rotational defect.  Neurological:       Mental status and motor strength appears baseline for patient and situation.  Skin: No rash noted.  Psychiatric: She has a normal mood and affect.    ED Course  Procedures (including critical care time) DIAGNOSTIC STUDIES:  COORDINATION OF CARE: 19:44--Patient / Family / Caregiver informed of clinical course, understand medical decision-making process, and agree with plan.   Labs Reviewed - No data to display Dg Hand Complete Right  09/19/2012  *RADIOLOGY REPORT*  Clinical Data: Hand injury.  RIGHT HAND - COMPLETE 3+ VIEW  Comparison: Wrist radiographs 06/30/2008.  Findings: There is an oblique fracture of the fifth metacarpal which involves most of the diaphysis.  This is minimally displaced and shows no definite extension into  the proximal or distal articulations.  No other acute fractures are seen.  IMPRESSION: Fifth metacarpal diaphyseal fracture without definite intra- articular extension.   Original Report Authenticated By: Gerrianne Scale, M.D.      1. Fracture of fifth metacarpal bone of right hand       MDM  Patient / Family / Caregiver informed of clinical course, understand medical decision-making process, and agree with plan. I personally performed the services described in this documentation, which was scribed in my presence. The recorded information has been reviewed and considered. I doubt any other  EMC precluding discharge at this time.   Hurman Horn, MD 09/20/12 [phone]

## 2012-09-19 NOTE — Progress Notes (Signed)
Orthopedic Tech Progress Note Patient Details:  Christina Morrison 1976-04-20 [id number]  Ortho Devices Type of Ortho Device: Ulna gutter splint Ortho Device/Splint Interventions: Ordered;Application   Jennye Moccasin 09/19/2012, 8:06 PM

## 2012-09-19 NOTE — ED Notes (Signed)
Pt reports punched a wall 5 days ago. Pt reports right hand was swollen the next day. Pt presents with slight swelling to right hand, good pulses. Bruising noted to right hand pink side.

## 2013-01-30 ENCOUNTER — Emergency Department (HOSPITAL_COMMUNITY): Payer: Self-pay

## 2013-01-30 ENCOUNTER — Emergency Department (HOSPITAL_COMMUNITY)
Admission: EM | Admit: 2013-01-30 | Discharge: 2013-01-30 | Disposition: A | Discharge status: Home / Self Care | Payer: Self-pay | Attending: Emergency Medicine | Admitting: Emergency Medicine

## 2013-01-30 ENCOUNTER — Encounter (HOSPITAL_COMMUNITY): Payer: Self-pay | Admitting: Emergency Medicine

## 2013-01-30 DIAGNOSIS — S61509A Unspecified open wound of unspecified wrist, initial encounter: Secondary | ICD-10-CM | POA: Insufficient documentation

## 2013-01-30 DIAGNOSIS — Z8679 Personal history of other diseases of the circulatory system: Secondary | ICD-10-CM | POA: Insufficient documentation

## 2013-01-30 DIAGNOSIS — Y9329 Activity, other involving ice and snow: Secondary | ICD-10-CM | POA: Insufficient documentation

## 2013-01-30 DIAGNOSIS — Y92009 Unspecified place in unspecified non-institutional (private) residence as the place of occurrence of the external cause: Secondary | ICD-10-CM | POA: Insufficient documentation

## 2013-01-30 DIAGNOSIS — Z8739 Personal history of other diseases of the musculoskeletal system and connective tissue: Secondary | ICD-10-CM | POA: Insufficient documentation

## 2013-01-30 DIAGNOSIS — W19XXXA Unspecified fall, initial encounter: Secondary | ICD-10-CM

## 2013-01-30 DIAGNOSIS — M549 Dorsalgia, unspecified: Secondary | ICD-10-CM

## 2013-01-30 DIAGNOSIS — W010XXA Fall on same level from slipping, tripping and stumbling without subsequent striking against object, initial encounter: Secondary | ICD-10-CM | POA: Insufficient documentation

## 2013-01-30 DIAGNOSIS — IMO0002 Reserved for concepts with insufficient information to code with codable children: Secondary | ICD-10-CM | POA: Insufficient documentation

## 2013-01-30 DIAGNOSIS — F17200 Nicotine dependence, unspecified, uncomplicated: Secondary | ICD-10-CM | POA: Insufficient documentation

## 2013-01-30 DIAGNOSIS — Z8659 Personal history of other mental and behavioral disorders: Secondary | ICD-10-CM | POA: Insufficient documentation

## 2013-01-30 MED ORDER — METHOCARBAMOL 500 MG PO TABS: 500.0000 mg | ORAL_TABLET | Freq: Two times a day (BID) | ORAL | Status: DC

## 2013-01-30 MED ORDER — HYDROCODONE-ACETAMINOPHEN 5-325 MG PO TABS: 2.0000 | ORAL_TABLET | Freq: Four times a day (QID) | ORAL | Status: DC | PRN

## 2013-01-30 MED ORDER — OXYCODONE-ACETAMINOPHEN 5-325 MG PO TABS
2.0000 | ORAL_TABLET | Freq: Once | ORAL | Status: AC
  Administered 2013-01-30: 2 via ORAL
  Filled 2013-01-30: qty 2

## 2013-01-30 NOTE — ED Provider Notes (Signed)
History     CSN: [id number]  Arrival date & time 01/30/13  4098   First MD Initiated Contact with Patient 01/30/13 [phone]      Chief Complaint  Patient presents with  . Abrasion    (Consider location/radiation/quality/duration/timing/severity/associated sxs/prior treatment) HPI Comments: This 37 year old female, who presents emergency department with chief complaint of low back pain. Patient states that she was moving a box, and slipped and fell on the ice in her driveway. Additionally she complains of an abrasion on the left anterior wrist. Bleeding is controlled. Patient states that she had tetanus shot about a year ago. She states that most of the pain is in her back, but also wanted to have the rest looked at. Nothing makes her symptoms better or worse. She has not tried anything to alleviate her symptoms. She denies fever, headache, chest pain, shortness of breath, nausea, vomiting, diarrhea, constipation, numbness and tingling of the extremities.  The history is provided by the patient. No language interpreter was used.    Past Medical History  Diagnosis Date  . Depression   . Anxiety   . Bipolar 1 disorder   . Scoliosis   . Migraine     History reviewed. No pertinent past surgical history.  No family history on file.  History  Substance Use Topics  . Smoking status: Current Every Day Smoker -- 1.00 packs/day    Types: Cigarettes  . Smokeless tobacco: Not on file  . Alcohol Use: No     Comment: once a month    OB History   Grav Para Term Preterm Abortions TAB SAB Ect Mult Living                  Review of Systems  All other systems reviewed and are negative.    Allergies  Review of patient's allergies indicates no known allergies.  Home Medications   Current Outpatient Rx  Name  Route  Sig  Dispense  Refill  . ibuprofen (ADVIL,MOTRIN) 200 MG tablet   Oral   Take 600 mg by mouth every 6 (six) hours as needed for pain.           BP 132/85  Pulse  100  Temp(Src) 98 F (36.7 C) (Oral)  Resp 18  SpO2 98%  Physical Exam  Nursing note and vitals reviewed. Constitutional: She is oriented to person, place, and time. She appears well-developed and well-nourished.  HENT:  Head: Normocephalic and atraumatic.  Eyes: Conjunctivae and EOM are normal. Pupils are equal, round, and reactive to light.  Neck: Normal range of motion. Neck supple.  Cardiovascular: Normal rate and regular rhythm.  Exam reveals no gallop and no friction rub.   No murmur heard. Pulmonary/Chest: Effort normal and breath sounds normal. No respiratory distress. She has no wheezes. She has no rales. She exhibits no tenderness.  Abdominal: Soft. Bowel sounds are normal. She exhibits no distension and no mass. There is no tenderness. There is no rebound and no guarding.  Musculoskeletal: Normal range of motion. She exhibits no edema and no tenderness.  Lumbar spine and lumbar paraspinal muscles mildly tender to palpation, range of motion is full, but painful. No gross abnormality or deformity.  Neurological: She is alert and oriented to person, place, and time.  Skin: Skin is warm and dry.  Several shallow lacerations to the left aspect of the anterior wrist  Psychiatric: She has a normal mood and affect. Her behavior is normal. Judgment and thought content normal.  ED Course  Procedures (including critical care time)  Dg Lumbar Spine Complete  01/30/2013  *RADIOLOGY REPORT*  Clinical Data: Fall  LUMBAR SPINE - COMPLETE 4+ VIEW  Comparison: 08/18/2010  Findings: No vertebral compression deformity.  Stable alignment. Stable disc height.  Mild vascular calcifications.  No definite acute fracture.  IUD projects over the pelvis.  IMPRESSION: No acute bony pathology.   Original Report Authenticated By: Jolaine Click, M.D.       1. Fall   2. Back pain   3. Laceration       MDM  37 year old female with back pain, and wrist abrasions/lacerations. Wrist lacerations are  suspicious for cutting behavior. Will give the patient pain medicine, obtain lumbar plain films, will repair the laceration slightly with Dermabond.  8:07 AM Patient's xray is negative.  Repaired the small wrist laceration with dermabond.  Patient's tetanus is up to date.  Will discharge with a couple norco and robaxin.  Patient understands and agrees with the plan.  She is stable and ready for discharge.        Roxy Horseman, PA-C 01/30/13 0811  Roxy Horseman, PA-C 01/30/13 [phone]

## 2013-01-30 NOTE — ED Notes (Signed)
Pt transported to xray 

## 2013-01-30 NOTE — ED Notes (Signed)
Pt was moving a box prior to arrival to ED, tripped and fell bracing self with left arm - abrasion noted to anterior aspect of left forearm, bleeding controlled - site open to air at present, clean and dry.  Last tetanus shot was about a year ago.

## 2013-01-30 NOTE — ED Notes (Signed)
Dermabond placed in room

## 2013-01-30 NOTE — ED Provider Notes (Signed)
Medical screening examination/treatment/procedure(s) were performed by non-physician practitioner and as supervising physician I was immediately available for consultation/collaboration.   Nirvi Boehler M Jantz Main, MD 01/30/13 0818 

## 2013-01-30 NOTE — ED Notes (Signed)
Resident at bedside performing suture care. Dermabond applied without difficulty. Pt to be discharged. Has no further questions at the time. Vital signs stable.

## 2013-02-12 ENCOUNTER — Encounter (HOSPITAL_COMMUNITY): Payer: Self-pay | Admitting: Nurse Practitioner

## 2013-02-12 ENCOUNTER — Emergency Department (HOSPITAL_COMMUNITY)
Admission: EM | Admit: 2013-02-12 | Discharge: 2013-02-12 | Disposition: A | Discharge status: Home / Self Care | Payer: Self-pay | Attending: Emergency Medicine | Admitting: Emergency Medicine

## 2013-02-12 DIAGNOSIS — K029 Dental caries, unspecified: Secondary | ICD-10-CM | POA: Insufficient documentation

## 2013-02-12 DIAGNOSIS — F17200 Nicotine dependence, unspecified, uncomplicated: Secondary | ICD-10-CM | POA: Insufficient documentation

## 2013-02-12 DIAGNOSIS — Z8739 Personal history of other diseases of the musculoskeletal system and connective tissue: Secondary | ICD-10-CM | POA: Insufficient documentation

## 2013-02-12 DIAGNOSIS — Z79899 Other long term (current) drug therapy: Secondary | ICD-10-CM | POA: Insufficient documentation

## 2013-02-12 DIAGNOSIS — R110 Nausea: Secondary | ICD-10-CM | POA: Insufficient documentation

## 2013-02-12 DIAGNOSIS — Z8659 Personal history of other mental and behavioral disorders: Secondary | ICD-10-CM | POA: Insufficient documentation

## 2013-02-12 DIAGNOSIS — Z8669 Personal history of other diseases of the nervous system and sense organs: Secondary | ICD-10-CM | POA: Insufficient documentation

## 2013-02-12 MED ORDER — PENICILLIN V POTASSIUM 500 MG PO TABS: 500.0000 mg | ORAL_TABLET | Freq: Four times a day (QID) | ORAL | Status: DC

## 2013-02-12 MED ORDER — PENICILLIN V POTASSIUM 250 MG PO TABS
500.0000 mg | ORAL_TABLET | Freq: Once | ORAL | Status: AC
  Administered 2013-02-12: 500 mg via ORAL
  Filled 2013-02-12: qty 2

## 2013-02-12 MED ORDER — OXYCODONE-ACETAMINOPHEN 5-325 MG PO TABS: 1.0000 | ORAL_TABLET | ORAL | Status: DC | PRN

## 2013-02-12 MED ORDER — OXYCODONE-ACETAMINOPHEN 5-325 MG PO TABS
1.0000 | ORAL_TABLET | Freq: Once | ORAL | Status: AC
  Administered 2013-02-12: 1 via ORAL
  Filled 2013-02-12: qty 1

## 2013-02-12 MED ORDER — ONDANSETRON 4 MG PO TBDP
8.0000 mg | ORAL_TABLET | Freq: Once | ORAL | Status: AC
  Administered 2013-02-12: 8 mg via ORAL
  Filled 2013-02-12: qty 2

## 2013-02-12 MED ORDER — PROMETHAZINE HCL 25 MG PO TABS: 25.0000 mg | ORAL_TABLET | Freq: Four times a day (QID) | ORAL | Status: DC | PRN

## 2013-02-12 NOTE — ED Provider Notes (Signed)
History     CSN: [id number]  Arrival date & time 02/12/13  1007   First MD Initiated Contact with Patient 02/12/13 1009      Chief Complaint  Patient presents with  . Dental Pain    (Consider location/radiation/quality/duration/timing/severity/associated sxs/prior treatment) Patient is a 37 y.o. female presenting with tooth pain. The history is provided by the patient.  Dental PainThe primary symptoms include mouth pain. Primary symptoms do not include fever. The symptoms began 2 days ago. The symptoms are worsening. The symptoms occur constantly.  Additional symptoms do not include: facial swelling and trouble swallowing. Associated symptoms comments: Upper molar pain that has been broken for a long time and increasingly painful for the past 2 days. No facial swelling or fever. .    Past Medical History  Diagnosis Date  . Depression   . Anxiety   . Bipolar 1 disorder   . Scoliosis   . Migraine     History reviewed. No pertinent past surgical history.  History reviewed. No pertinent family history.  History  Substance Use Topics  . Smoking status: Current Every Day Smoker -- 1.00 packs/day    Types: Cigarettes  . Smokeless tobacco: Not on file  . Alcohol Use: No     Comment: once a month    OB History   Grav Para Term Preterm Abortions TAB SAB Ect Mult Living                  Review of Systems  Constitutional: Negative for fever.  HENT: Positive for dental problem. Negative for facial swelling and trouble swallowing.   Gastrointestinal: Positive for nausea. Negative for abdominal pain.  Musculoskeletal: Negative for myalgias.    Allergies  Review of patient's allergies indicates no known allergies.  Home Medications   Current Outpatient Rx  Name  Route  Sig  Dispense  Refill  . HYDROcodone-acetaminophen (NORCO/VICODIN) 5-325 MG per tablet   Oral   Take 2 tablets by mouth every 6 (six) hours as needed for pain.   10 tablet   0   . ibuprofen  (ADVIL,MOTRIN) 200 MG tablet   Oral   Take 600 mg by mouth every 6 (six) hours as needed for pain.         . methocarbamol (ROBAXIN) 500 MG tablet   Oral   Take 1 tablet (500 mg total) by mouth 2 (two) times daily.   20 tablet   0     BP 142/95  Pulse 89  Temp(Src) 97.1 F (36.2 C) (Oral)  Resp 18  SpO2 100%  LMP 01/29/2013  Physical Exam  Constitutional: She is oriented to person, place, and time. She appears well-developed and well-nourished. No distress.  HENT:  Generally good dentition with upper left first molar broken with central decay.   Neck: Normal range of motion.  Pulmonary/Chest: Effort normal.  Abdominal: There is no tenderness.  Lymphadenopathy:    She has no cervical adenopathy.  Neurological: She is alert and oriented to person, place, and time.  Skin: Skin is warm and dry.    ED Course  Procedures (including critical care time)  Labs Reviewed - No data to display No results found.   No diagnosis found.  1. Dental pain  MDM  Will treat for possibility of dental infection causing increased pain.        Arnoldo Hooker, PA-C 02/12/13 1034

## 2013-02-12 NOTE — ED Provider Notes (Signed)
Medical screening examination/treatment/procedure(s) were performed by non-physician practitioner and as supervising physician I was immediately available for consultation/collaboration.   Laray Anger, DO 02/12/13 2053

## 2013-02-12 NOTE — ED Notes (Signed)
Pt reports L upper toothache for past days, unable to afford dental care.

## 2013-03-05 ENCOUNTER — Emergency Department (HOSPITAL_COMMUNITY): Payer: Self-pay

## 2013-03-05 ENCOUNTER — Emergency Department (HOSPITAL_COMMUNITY)
Admission: EM | Admit: 2013-03-05 | Discharge: 2013-03-05 | Disposition: A | Discharge status: Home / Self Care | Payer: Self-pay | Attending: Emergency Medicine | Admitting: Emergency Medicine

## 2013-03-05 ENCOUNTER — Encounter (HOSPITAL_COMMUNITY): Payer: Self-pay | Admitting: Adult Health

## 2013-03-05 DIAGNOSIS — Z8739 Personal history of other diseases of the musculoskeletal system and connective tissue: Secondary | ICD-10-CM | POA: Insufficient documentation

## 2013-03-05 DIAGNOSIS — G43909 Migraine, unspecified, not intractable, without status migrainosus: Secondary | ICD-10-CM | POA: Insufficient documentation

## 2013-03-05 DIAGNOSIS — F17200 Nicotine dependence, unspecified, uncomplicated: Secondary | ICD-10-CM | POA: Insufficient documentation

## 2013-03-05 DIAGNOSIS — Z8659 Personal history of other mental and behavioral disorders: Secondary | ICD-10-CM | POA: Insufficient documentation

## 2013-03-05 LAB — BASIC METABOLIC PANEL
BUN: 8 mg/dL (ref 6–23)
Calcium: 9.4 mg/dL (ref 8.4–10.5)
Creatinine, Ser: 0.59 mg/dL (ref 0.50–1.10)
GFR calc Af Amer: >90 mL/min (ref >90)

## 2013-03-05 LAB — ETHANOL: Alcohol, Ethyl (B): 164 mg/dL — ABNORMAL HIGH (ref 0–11)

## 2013-03-05 MED ORDER — IBUPROFEN 800 MG PO TABS
800.0000 mg | ORAL_TABLET | Freq: Once | ORAL | Status: AC
  Administered 2013-03-05: 800 mg via ORAL
  Filled 2013-03-05: qty 1

## 2013-03-05 MED ORDER — DIPHENHYDRAMINE HCL 25 MG PO CAPS: 25.0000 mg | ORAL_CAPSULE | Freq: Four times a day (QID) | ORAL | Status: DC | PRN

## 2013-03-05 MED ORDER — PROCHLORPERAZINE MALEATE 10 MG PO TABS: 10.0000 mg | ORAL_TABLET | Freq: Two times a day (BID) | ORAL | Status: DC | PRN

## 2013-03-05 MED ORDER — DIPHENHYDRAMINE HCL 25 MG PO CAPS
25.0000 mg | ORAL_CAPSULE | Freq: Once | ORAL | Status: AC
  Administered 2013-03-05: 25 mg via ORAL
  Filled 2013-03-05 (×2): qty 1

## 2013-03-05 MED ORDER — IBUPROFEN 400 MG PO TABS: 400.0000 mg | ORAL_TABLET | Freq: Four times a day (QID) | ORAL | Status: DC | PRN

## 2013-03-05 MED ORDER — HALOPERIDOL 5 MG PO TABS
5.0000 mg | ORAL_TABLET | Freq: Once | ORAL | Status: AC
  Administered 2013-03-05: 5 mg via ORAL
  Filled 2013-03-05: qty 1

## 2013-03-05 MED ORDER — PROCHLORPERAZINE MALEATE 10 MG PO TABS
10.0000 mg | ORAL_TABLET | Freq: Once | ORAL | Status: AC
  Administered 2013-03-05: 10 mg via ORAL
  Filled 2013-03-05: qty 1

## 2013-03-05 NOTE — ED Provider Notes (Signed)
History     CSN: [id number]  Arrival date & time 03/05/13  0055   First MD Initiated Contact with Patient 03/05/13 0107      Chief Complaint  Patient presents with  . Headache    (Consider location/radiation/quality/duration/timing/severity/associated sxs/prior treatment) HPI Christina Morrison is a 37 y.o. female presenting with headache. Patient has a history of migraines, but she's only had them a couple times before, she also has a pertinent medical history of anxiety, depression, bipolar 1. Patient says the headache started gradually, he came on yesterday, it is behind the left eye feels like her "left eye is going to pop out", her pain is severe, she has nausea is had mild vomiting, photophobia and phonophobia. Patient said yesterday she felt like her left side of her face was drooping, she says she has some paresthesias around her left eye and left aspect of her cheek.  She denies any other paresthesias, denies weakness or ataxia, denies any falls. She denies any antecedent trauma. Denies any chest pain, shortness of breath, diarrhea. No abdominal pain, no shortness of breath, no recent illnesses. No ankle swelling, no arthralgias or myalgias. Patient says she is under an increased amount of stress but will not specify what from, she did recently move. She denies any suicidal or homicidal intentions. She says that she can handle the stress right now. Patient denies any alcohol or illicit drug use.   Past Medical History  Diagnosis Date  . Depression   . Anxiety   . Bipolar 1 disorder   . Scoliosis   . Migraine     History reviewed. No pertinent past surgical history.  History reviewed. No pertinent family history.  History  Substance Use Topics  . Smoking status: Current Every Day Smoker -- 1.00 packs/day    Types: Cigarettes  . Smokeless tobacco: Not on file  . Alcohol Use: No     Comment: once a month    OB History   Grav Para Term Preterm Abortions TAB SAB Ect Mult Living                   Review of Systems At least 10pt or greater review of systems completed and are negative except where specified in the HPI.  Allergies  Review of patient's allergies indicates no known allergies.  Home Medications   Current Outpatient Rx  Name  Route  Sig  Dispense  Refill  . HYDROcodone-acetaminophen (NORCO/VICODIN) 5-325 MG per tablet   Oral   Take 2 tablets by mouth every 6 (six) hours as needed for pain.   10 tablet   0   . ibuprofen (ADVIL,MOTRIN) 200 MG tablet   Oral   Take 600 mg by mouth every 6 (six) hours as needed for pain.         . methocarbamol (ROBAXIN) 500 MG tablet   Oral   Take 1 tablet (500 mg total) by mouth 2 (two) times daily.   20 tablet   0   . oxyCODONE-acetaminophen (PERCOCET/ROXICET) 5-325 MG per tablet   Oral   Take 1 tablet by mouth every 4 (four) hours as needed for pain.   15 tablet   0   . penicillin v potassium (VEETID) 500 MG tablet   Oral   Take 1 tablet (500 mg total) by mouth 4 (four) times daily.   40 tablet   0   . promethazine (PHENERGAN) 25 MG tablet   Oral   Take 1 tablet (25 mg total)  by mouth every 6 (six) hours as needed for nausea.   10 tablet   0     BP 149/97  Pulse 104  Temp(Src) 98.7 F (37.1 C) (Oral)  Resp 16  SpO2 99%  Physical Exam  Nursing notes reviewed.  Electronic medical record reviewed. VITAL SIGNS:   Filed Vitals:   03/05/13 0104  BP: 149/97  Pulse: 104  Temp: 98.7 F (37.1 C)  TempSrc: Oral  Resp: 16  SpO2: 99%   CONSTITUTIONAL: Awake, oriented, appears non-toxic HENT: Atraumatic, normocephalic, oral mucosa pink and moist, airway patent. Nares patent without drainage. External ears normal, TMs clear bilaterally-no vesicles in the ears on the tip of the nose or in the ear canals. EYES: Conjunctiva clear, EOMI, PERRLA NECK: Trachea midline, non-tender, supple CARDIOVASCULAR: Normal heart rate, Normal rhythm, No murmurs, rubs, gallops PULMONARY/CHEST: Clear to  auscultation, no rhonchi, wheezes, or rales. Symmetrical breath sounds. Non-tender. ABDOMINAL: Non-distended, soft, non-tender - no rebound or guarding.  BS normal. NEUROLOGIC:Facial sensation equal to light touch bilaterally.  Good muscle bulk in the masseter muscle and good lateral movement of the jaw.  Facial expressions equal and good strength with smile/frown and puffed cheeks.  Hearing grossly intact to finger rub test.  Uvula, tongue are midline with no deviation. Symmetrical palate elevation.  Trapezius and SCM muscles are 5/5 strength bilaterally.  No dysmetria with rapid alternating movements, finger to nose testing or heel to shin testing. 2+ reflexes bilaterally patellar, biceps and brachial radialis. No clonus.  Strength is 5/5 in upper extremity flexors and extensors bilaterally. No gross sensory deficits. EXTREMITIES: No clubbing, cyanosis, or edema SKIN: Warm, Dry, No erythema, No rash  ED Course  Procedures (including critical care time)  Labs Reviewed  ETHANOL - Abnormal; Notable for the following:    Alcohol, Ethyl (B) 164 (*)    All other components within normal limits  BASIC METABOLIC PANEL   Ct Head Wo Contrast  03/05/2013  *RADIOLOGY REPORT*  Clinical Data: 37 year old female with severe headache.  CT HEAD WITHOUT CONTRAST  Technique:  Contiguous axial images were obtained from the base of the skull through the vertex without contrast.  Comparison: 08/08/2010 CT  Findings: No intracranial abnormalities are identified, including mass lesion or mass effect, hydrocephalus, extra-axial fluid collection, midline shift, hemorrhage, or acute infarction.  The visualized bony calvarium is unremarkable.  IMPRESSION: Unremarkable noncontrast head CT   Original Report Authenticated By: Harmon Pier, M.D.      1. Migraine headache       MDM  Christina Morrison is a 37 y.o. female  presenting with migraine spectrum headache most likely, did not come on suddenly, no focal neurologic  deficits, normal mentation and level of consciousness-subarachnoid hemorrhage is very low my differential diagnosis at this point, but given that she's had a change in her headache pattern will obtain CT of the head. Will treat patient with migraine cocktail. Patient is moderately slurring her words, she smells like she's been drinking alcohol-will check an alcohol level as well.  ETOH positive, suggests cluster H/A or migraine.  Pt responded well to medication in the ED.  Pt asked multiple times for pain medicine - I gave her medicine that had worked in the ED for migraine HA.  Doubt SAH, CNS infection.  Suspect cluster/migraine HA.  Discussed smoking cessation, EtOH cessation.  Pt understands - she says smoking (during her ER stay) made her HA worse.  LP not indicated at this time.   I explained the diagnosis  and have given explicit precautions to return to the ER including worsening headache or any other new or worsening symptoms. The patient understands and accepts the medical plan as it's been dictated and I have answered their questions. Discharge instructions concerning home care and prescriptions have been given.  The patient is STABLE and is discharged to home in good condition.          Jones Skene, MD 03/05/13 1710

## 2013-03-05 NOTE — ED Notes (Signed)
MD at bedside. 

## 2013-03-05 NOTE — ED Notes (Signed)
Presents with headache that began yesterday associated with left sided facial droop per pt and left sided facial numbness. Pain is behind left eye and described as "it feels like my eye is going to pop out" pt sensitive to light and sound. Reports nausea. Alert and oriented and answers all questions appropriately. No droop or drift.

## 2013-03-05 NOTE — ED Notes (Signed)
Patient transported to CT 

## 2013-03-05 NOTE — ED Notes (Signed)
Pt. Walked out to have a cigarette. Pt. 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 She verbalized understanding.

## 2013-04-29 ENCOUNTER — Encounter (HOSPITAL_COMMUNITY): Payer: Self-pay | Admitting: *Deleted

## 2013-04-29 DIAGNOSIS — Z8739 Personal history of other diseases of the musculoskeletal system and connective tissue: Secondary | ICD-10-CM | POA: Insufficient documentation

## 2013-04-29 DIAGNOSIS — G43909 Migraine, unspecified, not intractable, without status migrainosus: Secondary | ICD-10-CM | POA: Insufficient documentation

## 2013-04-29 DIAGNOSIS — Z8659 Personal history of other mental and behavioral disorders: Secondary | ICD-10-CM | POA: Insufficient documentation

## 2013-04-29 DIAGNOSIS — H53149 Visual discomfort, unspecified: Secondary | ICD-10-CM | POA: Insufficient documentation

## 2013-04-29 DIAGNOSIS — F17200 Nicotine dependence, unspecified, uncomplicated: Secondary | ICD-10-CM | POA: Insufficient documentation

## 2013-04-29 DIAGNOSIS — R110 Nausea: Secondary | ICD-10-CM | POA: Insufficient documentation

## 2013-04-29 MED ORDER — OXYCODONE-ACETAMINOPHEN 5-325 MG PO TABS
1.0000 | ORAL_TABLET | Freq: Once | ORAL | Status: AC
  Administered 2013-04-29: 1 via ORAL
  Filled 2013-04-29: qty 1

## 2013-04-29 MED ORDER — ONDANSETRON 4 MG PO TBDP
4.0000 mg | ORAL_TABLET | Freq: Once | ORAL | Status: AC
  Administered 2013-04-29: 4 mg via ORAL
  Filled 2013-04-29: qty 1

## 2013-04-29 NOTE — ED Notes (Signed)
Pt complaining of nausea, pt still complaining of 8/10 HA

## 2013-04-29 NOTE — ED Notes (Addendum)
Pt states that she has a hx of tension HA. Pt states that his HA has been going on for 2 days and getting worse. Pt states that she feels like a band is wrapped around her head. Pt nauseated. Pt states that she tried Ibuprofen with no relief. Pt alert and oriented, able to follow commands and move all extremities. Pt took ibuprofen at 14:00 today.

## 2013-04-30 ENCOUNTER — Emergency Department (HOSPITAL_COMMUNITY)
Admission: EM | Admit: 2013-04-30 | Discharge: 2013-04-30 | Disposition: A | Discharge status: Home / Self Care | Payer: Self-pay | Attending: Emergency Medicine | Admitting: Emergency Medicine

## 2013-04-30 DIAGNOSIS — G43909 Migraine, unspecified, not intractable, without status migrainosus: Secondary | ICD-10-CM

## 2013-04-30 MED ORDER — CYCLOBENZAPRINE HCL 10 MG PO TABS: 10.0000 mg | ORAL_TABLET | Freq: Three times a day (TID) | ORAL | Status: DC | PRN

## 2013-04-30 MED ORDER — NAPROXEN 500 MG PO TABS: 500.0000 mg | ORAL_TABLET | Freq: Two times a day (BID) | ORAL | Status: DC

## 2013-04-30 MED ORDER — ONDANSETRON 4 MG PO TBDP
4.0000 mg | ORAL_TABLET | Freq: Once | ORAL | Status: AC
Start: 2013-04-30 — End: 2013-04-30
  Administered 2013-04-30: 4 mg via ORAL
  Filled 2013-04-30: qty 1

## 2013-04-30 MED ORDER — HYDROMORPHONE HCL PF 2 MG/ML IJ SOLN: 2.0000 mg | Freq: Once | INTRAMUSCULAR | Status: DC

## 2013-04-30 MED ORDER — HYDROMORPHONE HCL PF 2 MG/ML IJ SOLN
2.0000 mg | Freq: Once | INTRAMUSCULAR | Status: AC
  Administered 2013-04-30: 2 mg via INTRAMUSCULAR
  Filled 2013-04-30: qty 1

## 2013-04-30 NOTE — ED Notes (Signed)
Pt stated that she has a tension headache x 2 days. Nausea and Vomiting due to pain. Pt does not take anything at home for pain. Sensitivity to light and sound. Complaining of mild dizziness. No other neurological deficits.

## 2013-04-30 NOTE — ED Provider Notes (Signed)
Medical screening examination/treatment/procedure(s) were performed by non-physician practitioner and as supervising physician I was immediately available for consultation/collaboration.   Gwyneth Sprout, MD 04/30/13 [phone]

## 2013-04-30 NOTE — ED Provider Notes (Signed)
History     CSN: [id number]  Arrival date & time 04/29/13  2035   First MD Initiated Contact with Patient 04/30/13 0147      Chief Complaint  Patient presents with  . Headache    (Consider location/radiation/quality/duration/timing/severity/associated sxs/prior treatment) Patient is a 37 y.o. female presenting with headaches. The history is provided by the patient.  Headache Pain location:  Frontal Quality:  Dull Radiates to:  Does not radiate Severity currently:  7/10 Onset quality:  Gradual Duration:  2 days Timing:  Intermittent Progression:  Waxing and waning Chronicity:  Recurrent Similar to prior headaches: yes   Context: activity, bright light, emotional stress and loud noise   Relieved by:  Nothing Worsened by:  Light, sound and activity Ineffective treatments:  NSAIDs and acetaminophen Associated symptoms: nausea and photophobia   Associated symptoms: no abdominal pain, no back pain, no blurred vision, no diarrhea, no dizziness, no ear pain, no pain, no fatigue, no fever, no focal weakness, no hearing loss, no loss of balance, no myalgias, no near-syncope, no neck pain, no neck stiffness, no numbness, no paresthesias, no sinus pressure, no sore throat, no syncope, no URI, no vomiting and no weakness   Risk factors: no family hx of SAH     Past Medical History  Diagnosis Date  . Depression   . Anxiety   . Bipolar 1 disorder   . Scoliosis   . Migraine     History reviewed. No pertinent past surgical history.  History reviewed. No pertinent family history.  History  Substance Use Topics  . Smoking status: Current Every Day Smoker -- 1.00 packs/day    Types: Cigarettes  . Smokeless tobacco: Not on file  . Alcohol Use: No     Comment: once a month    OB History   Grav Para Term Preterm Abortions TAB SAB Ect Mult Living                  Review of Systems  Constitutional: Negative for fever and fatigue.  HENT: Negative for hearing loss, ear pain,  sore throat, neck pain, neck stiffness and sinus pressure.   Eyes: Positive for photophobia. Negative for blurred vision and pain.  Cardiovascular: Negative for syncope and near-syncope.  Gastrointestinal: Positive for nausea. Negative for vomiting, abdominal pain and diarrhea.  Musculoskeletal: Negative for myalgias and back pain.  Neurological: Positive for headaches. Negative for dizziness, focal weakness, numbness, paresthesias and loss of balance.  All other systems reviewed and are negative.    Allergies  Review of patient's allergies indicates no known allergies.  Home Medications   Current Outpatient Rx  Name  Route  Sig  Dispense  Refill  . ibuprofen (ADVIL,MOTRIN) 200 MG tablet   Oral   Take 600 mg by mouth every 6 (six) hours as needed for pain or headache.            BP 122/92  Pulse 84  Temp(Src) 97.9 F (36.6 C) (Oral)  Resp 16  SpO2 99%  Physical Exam  Nursing note and vitals reviewed. Constitutional: She is oriented to person, place, and time. She appears well-developed and well-nourished. She appears distressed.  HENT:  Head: Normocephalic and atraumatic.  Eyes: Conjunctivae and EOM are normal. Pupils are equal, round, and reactive to light. No scleral icterus.  Neck: Normal range of motion.  Neck supple with no nuchal rigidity, full pain free ROM. No carotid bruit  Cardiovascular: Normal rate, regular rhythm, normal heart sounds and intact distal  pulses.   Pulmonary/Chest: Effort normal and breath sounds normal. No respiratory distress.  Musculoskeletal: Normal range of motion.  Neurological: She is alert and oriented to person, place, and time. She has normal strength.  CN III-XII intact, good coordination, normal gait, strength 5/5 bilaterally, intact distal sensation.  Skin: Skin is warm and dry.  No rash, non diaphoretic    ED Course  Procedures (including critical care time)  Labs Reviewed - No data to display No results found.   No  diagnosis found.    MDM  Pt HA treated and improved while in ED.  Presentation is like pts typical HA and non concerning for [facility], ICH, Meningitis, or temporal arteritis. Pt is afebrile with no focal neuro deficits, nuchal rigidity, or change in vision. Pt is to follow up with PCP to discuss prophylactic medication. Pt verbalizes understanding and is agreeable with plan to dc.          Jaci Carrel, [place] 04/30/13 [phone]

## 2014-02-14 ENCOUNTER — Emergency Department (HOSPITAL_COMMUNITY)
Admission: EM | Admit: 2014-02-14 | Discharge: 2014-02-14 | Disposition: A | Discharge status: Home / Self Care | Payer: Self-pay | Attending: Emergency Medicine | Admitting: Emergency Medicine

## 2014-02-14 ENCOUNTER — Encounter (HOSPITAL_COMMUNITY): Payer: Self-pay | Admitting: Emergency Medicine

## 2014-02-14 DIAGNOSIS — S61209A Unspecified open wound of unspecified finger without damage to nail, initial encounter: Secondary | ICD-10-CM | POA: Insufficient documentation

## 2014-02-14 DIAGNOSIS — W268XXA Contact with other sharp object(s), not elsewhere classified, initial encounter: Secondary | ICD-10-CM | POA: Insufficient documentation

## 2014-02-14 DIAGNOSIS — Y929 Unspecified place or not applicable: Secondary | ICD-10-CM | POA: Insufficient documentation

## 2014-02-14 DIAGNOSIS — Y9389 Activity, other specified: Secondary | ICD-10-CM | POA: Insufficient documentation

## 2014-02-14 DIAGNOSIS — S61218A Laceration without foreign body of other finger without damage to nail, initial encounter: Secondary | ICD-10-CM

## 2014-02-14 DIAGNOSIS — F17200 Nicotine dependence, unspecified, uncomplicated: Secondary | ICD-10-CM | POA: Insufficient documentation

## 2014-02-14 DIAGNOSIS — Z8739 Personal history of other diseases of the musculoskeletal system and connective tissue: Secondary | ICD-10-CM | POA: Insufficient documentation

## 2014-02-14 DIAGNOSIS — Z8679 Personal history of other diseases of the circulatory system: Secondary | ICD-10-CM | POA: Insufficient documentation

## 2014-02-14 DIAGNOSIS — Z8659 Personal history of other mental and behavioral disorders: Secondary | ICD-10-CM | POA: Insufficient documentation

## 2014-02-14 MED ORDER — IBUPROFEN 200 MG PO TABS
600.0000 mg | ORAL_TABLET | Freq: Once | ORAL | Status: AC
  Administered 2014-02-14: 600 mg via ORAL
  Filled 2014-02-14: qty 3

## 2014-02-14 MED ORDER — TETANUS-DIPHTH-ACELL PERTUSSIS 5-2.5-18.5 LF-MCG/0.5 IM SUSP
0.5000 mL | Freq: Once | INTRAMUSCULAR | Status: DC
Start: 2014-02-14 — End: 2014-02-14

## 2014-02-14 MED ORDER — BUPIVACAINE HCL (PF) 0.5 % IJ SOLN
10.0000 mL | Freq: Once | INTRAMUSCULAR | Status: AC
  Administered 2014-02-14: 10 mL
  Filled 2014-02-14: qty 30

## 2014-02-14 NOTE — Discharge Instructions (Signed)
Keep the sutures clean and dry.  Your sutures should be removed in 10-14 days.  This can be done through your primary care physician or urgent care
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
## 2014-02-14 NOTE — ED Notes (Signed)
Pt went to the restroom but declined to provide a urine sample.
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
## 2014-02-14 NOTE — ED Provider Notes (Signed)
Medical screening examination/treatment/procedure(s) were performed by non-physician practitioner and as supervising physician I was immediately available for consultation/collaboration.   EKG Interpretation None        Junius ArgyleForrest S Devlynn Knoff, MD 02/14/14 [id number] [id number]

## 2014-10-04 ENCOUNTER — Emergency Department (HOSPITAL_COMMUNITY)
Admission: EM | Admit: 2014-10-04 | Discharge: 2014-10-04 | Disposition: A | Discharge status: Home / Self Care | Payer: Self-pay | Attending: Emergency Medicine | Admitting: Emergency Medicine

## 2014-10-04 ENCOUNTER — Encounter (HOSPITAL_COMMUNITY): Payer: Self-pay | Admitting: Emergency Medicine

## 2014-10-04 DIAGNOSIS — M5440 Lumbago with sciatica, unspecified side: Secondary | ICD-10-CM

## 2014-10-04 DIAGNOSIS — Z8669 Personal history of other diseases of the nervous system and sense organs: Secondary | ICD-10-CM | POA: Insufficient documentation

## 2014-10-04 DIAGNOSIS — M419 Scoliosis, unspecified: Secondary | ICD-10-CM | POA: Insufficient documentation

## 2014-10-04 DIAGNOSIS — M545 Low back pain, unspecified: Secondary | ICD-10-CM

## 2014-10-04 DIAGNOSIS — Z720 Tobacco use: Secondary | ICD-10-CM | POA: Insufficient documentation

## 2014-10-04 DIAGNOSIS — M542 Cervicalgia: Secondary | ICD-10-CM | POA: Insufficient documentation

## 2014-10-04 DIAGNOSIS — Z8659 Personal history of other mental and behavioral disorders: Secondary | ICD-10-CM | POA: Insufficient documentation

## 2014-10-04 MED ORDER — IBUPROFEN 600 MG PO TABS: 600.0000 mg | ORAL_TABLET | Freq: Three times a day (TID) | ORAL | Status: DC

## 2014-10-04 MED ORDER — HYDROCODONE-ACETAMINOPHEN 5-325 MG PO TABS: 1.0000 | ORAL_TABLET | ORAL | Status: DC | PRN

## 2014-10-04 NOTE — Discharge Instructions (Signed)
Call for a follow up appointment with a Family or Primary Care Provider.  Call a back specialist for further evaluation of your chronic back and neck pain. Return if Symptoms worsen.   Take medication as prescribed.  Do not operate heavy machinery, drink alcohol while taking narcotic pain medication.   Emergency Department Resource Guide 1) Find a Doctor and Pay Out of Pocket Although you won't have to find out who is covered by your insurance plan, it is a good idea to ask around and get recommendations. You will then need to call the office and see if the doctor you have chosen will accept you as a new patient and what types of options they offer for patients who are self-pay. Some doctors offer discounts or will set up payment plans for their patients who do not have insurance, but you will need to ask so you aren't surprised when you get to your appointment.  2) Contact Your Local Health Department Not all health departments have doctors that can see patients for sick visits, but many do, so it is worth a call to see if yours does. If you don't know where your local health department is, you can check in your phone book. The CDC also has a tool to help you locate your state's health department, and many state websites also have listings of all of their local health departments.  3) Find a Walk-in Clinic If your illness is not likely to be very severe or complicated, you may want to try a walk in clinic. These are popping up all over the country in pharmacies, drugstores, and shopping centers. They're usually staffed by nurse practitioners or physician assistants that have been trained to treat common illnesses and complaints. They're usually fairly quick and inexpensive. However, if you have serious medical issues or chronic medical problems, these are probably not your best option.  No Primary Care Doctor: - Call Health Connect at  [phone] - they can help you locate a primary care doctor that   accepts your insurance, provides certain services, etc. - Physician Referral Service- [phone]  Chronic Pain Problems: Organization         Address  Phone   Notes  [facility]  [phone] Patients need to be referred by their primary care doctor.   Medication Assistance: Organization         Address  Phone   Notes  [facility] [address] [place], [place] [address] [phone] --Must be a resident of [facility] -- Must have NO insurance coverage whatsoever (no Medicaid/ Medicare, etc.) -- The pt. MUST have a primary care doctor that directs their care regularly and follows them in the community   [facility]ist  [phone]   [employer]  [phone]    Agencies that provide inexpensive medical care: Organization         Address  Phone   Notes  [facility]  [phone]   [facility]    [phone]   [facility] [address] [place], [place] [address] [phone]   [facility] [address], [place] [phone]   [facility]    [phone]   [facility]    [phone]   [facility]  [address], [place] Phone:  [phone], Fax:  [phone] Hours of Operation:  9 am - 6 pm, M-F.  Also accepts Medicaid/Medicare and self-pay.  °[facility] ° [address], [place] Phone: [phone], Fax: [phone]. Hours of Operation:  8:30 am - 5:30 pm, M-F.  Also accepts Medicaid and self-pay.  °[facility] [place] [address], [place] Phone: [phone]) [phone]   °[facility] [address], [place], [place] ([phone], Ext. 123 Mondays & Thursdays: 7-9 AM.  First 15 patients are seen on a first come, first serve basis. °  ° °Medicaid-accepting [place] Providers: ° °Organization          Address  Phone   Notes  °[facility] [address], [place] [phone] Also accepts self-pay patients.  °[facility] [address], [place] ° ([phone]) [phone]   °[facility] [address], [place] [phone]) [phone]   °[facility] [address] [place] [address], [place] ([phone]) [phone]   °Veita Bland [address], [place]  ° ([phone]) [phone] Only accepts [place] Access Medicaid patients after they have their name applied to their card.  ° °Self-Pay (no insurance) in [place]: ° °Organization         Address  Phone   Notes  °Sickle Cell Patients, [facility] [address], [place] ([phone]) [phone]   °[facility] [address], [place] ([phone]) [phone]   °[facility] [place] ° [address], [place] [phone]) [phone]   °[facility]/Dr. Osei-Bonsu ° [address] [place] [address], [place] or [address], [place] [phone]) [phone] Phone number for both [place] and [place] locations is the same.  °[facility] [address], [place] ([phone]) [phone]   °[facility] [address] [place] [address], [place] or [address] (336) [phone] °(336) [phone]   °[facility] [address], [place] (336) [phone], phone; ([phone], fax Sees patients 1st and 3rd Saturday of every month.  Must not qualify for public or private insurance (i.e. Medicaid, Medicare, [place] Health Choice, Veterans' Benefits) • Household income should be no more than 200% of the poverty level •The clinic cannot treat you if you are pregnant or think you are pregnant • Sexually transmitted diseases are not treated at the clinic.  ° ° °Dental Care: °Organization         Address  Phone  Notes  °[place] [facility] [address],  [place] ([phone]) [phone] Accepts children up to age 21 who are enrolled in Medicaid or [place] Health Choice; pregnant women with a Medicaid card; and children who have applied for Medicaid or [place] Health Choice, but were declined, whose parents can pay a reduced fee at time of service.  °[place] [facility] [place]  [address], [place] ([phone]) [phone] Accepts children up to age 21 who are enrolled in Medicaid or [place] Health Choice; pregnant women with a Medicaid card; and children who have applied for Medicaid or  Health Choice, but were declined, whose parents can pay a reduced fee at time of service.  °[facility] ° [address], [place] ([phone]) [phone] Patients are seen by appointment only. Walk-ins are not accepted. [facility] will see patients 18 years   of age and older. °Monday - Tuesday (8am-5pm) °Most Wednesdays (8:30-5pm) °$30 per visit, cash only  °[facility] ° [address], [place] [phone]) [phone] Patients are seen by appointment only. Walk-ins are not accepted. [facility] will see patients 18 years of age and older. °One Wednesday Evening (Monthly: Volunteer Based).  $30 per visit, cash only  °[facility]  ([phone]) [phone] for adults; Children under age 4, call Graduate Pediatric Dentistry at [phone]. Children aged 4-14, please call [phone]) [phone] to request a pediatric application. ° Dental services are provided in all areas of dental care including fillings, crowns and bridges, complete and partial dentures, implants, gum treatment, root canals, and extractions. Preventive care is also provided. Treatment is provided to both adults and children. °Patients are selected via a lottery and there is often a waiting list. °  °[facility] [address], °[place] ° ([phone]) [phone] [email or web address] °  °[facility] [address], [place], [place]  (336)[phone], Ext. 123 Second and Fourth Thursday of each month, opens at 6:30 AM; Clinic ends at 9 AM.  Patients are seen on a first-come first-served basis, and a limited number are seen during each clinic.  ° °[facility] ° [address], [place], [place] ([phone]) [phone]   Eligibility Requirements °You must have lived in [place], [place], or [place] counties for at least the last three months. °  You cannot be eligible for state or federal sponsored healthcare insurance, including Veterans Administration, Medicaid, or Medicare. °  You generally cannot be eligible for healthcare insurance through your employer.  °  How to apply: °Eligibility screenings are held every Tuesday and Wednesday afternoon from 1:00 pm until 4:00 pm. You do not need an appointment for the interview!  °[facility] [address], [place], [place] [phone]   °[facility]  [phone]   °[facility]  [phone]   °[facility]  336-570-6415   ° °Behavioral Health Resources in the Community: °Intensive Outpatient Programs °Organization         Address  Phone  Notes  °[facility] [address], [place], [place] [phone]   °[facility] [address], [place], [place] 336-832-9800   °[facility] [address], [place], [place] ° 336-882-2125   °[facility] [address],  °[place], [place] 1-800-853-5163 or 336-641-4981   °Substance Abuse Resources °Organization         Address  Phone  Notes  °[facility]  336-882-2125   °[facility]  336-784-9470   °[facility]  336-285-9073   °[facility]  336-845-3988   °Residential & Outpatient Substance Abuse Program  1-800-659-3381   °Psychological Services °Organization         Address  Phone  Notes  °[facility]  336- 832-9600   °[facility]  336- 378-7881    °[facility] [address], [place] 1-800-853-5163 or 336-641-4981   ° °Mobile Crisis Teams °Organization         Address  Phone  Notes  °[facility], Mobile Crisis Care Unit  1-877-626-1772   °Assertive °[facility] ° [address] [place], [place] 336-834-9664   °Sharon DeEsch [address] °[place]  336-554-5454   ° °Self-Help/Support Groups °Organization           Address  Phone             Notes  [place] [facility] [place] - variety of support groups  [place] Call for more information  Narcotics Anonymous (NA), [facility] [address], [employer] [place]  2 meetings at this location   Special educational needs teacher         Address  Phone  Notes  [facility] [place],    [place]  1-(224)335-7018   [facility]  [address] [place] [address], [place], [place]   [place], [place] [phone] Admissions: 8am-3pm M-F  Incentives Substance [place] [address]    [place], [place] [phone]   [facility] [address] [place], [place], [place]   [facility] [address]  [place], [place]   [facility] - Intensive Outpatient [address], [place], [place]   [facility] ([place] [address]  [place], [place] 1-360-145-2736 or 585-331-6832   [facility] ([facility]) [address] [place], [place] Accepts Medicaid  [facility] [address]  [place] 1-226-409-9984 Substance Abuse/Addiction Treatment   [facility]         Address  Phone  Notes  [facility]  7097276150   Domenic Schwab, PhD [address] [place]lis [place], [place]   (731) 107-6134 or (505) [phone]   [place], [place] ([phone]   [facility] [address], [place], [place] 540-398-4394 Insurance/Medicaid/sponsorship through [facility] and Families [address] [place]                                    [place], [place] [phone] [place] [address], [place] [phone]    Dr. Adele Schilder  [phone]   [facility] Dept. 1) [address] [place] 2) [place] 3)  [address], [place] [phone]  [phone]   [place] [phone] or [phone] (After Hours)

## 2014-10-04 NOTE — ED Provider Notes (Signed)
CSN: [id number]     Arrival date & time 10/04/14  [id number] History   This chart was scribed for non-physician practitioner, Clabe SealLauren M Mayce Noyes, PA-C, working with Derwood KaplanAnkit Nanavati, MD by Charline BillsEssence Howell, ED Scribe. This patient was seen in room TR05C/TR05C and the patient's care was started at 9:17 AM.   Chief Complaint  Patient presents with  . Back Pain   HPI Comments: Christina Morrison is a 38 y.o. Female, with a h/o scoliosis, who presents to the Emergency Department complaining of gradually worsening lower back pain that radiates into buttocks 3 days ago. She also reports neck pain onset 3 days ago. She denies injury. Pain is exacerbated with moving and lifting. Pt reports h/o similar pain. She has not seen a back specialist in the past. Pt has tried Vicodin without relief.   The history is provided by the patient. No language interpreter was used.   Past Medical History  Diagnosis Date  . Depression   . Anxiety   . Bipolar 1 disorder   . Scoliosis   . Migraine    History reviewed. No pertinent past surgical history. History reviewed. No pertinent family history. History  Substance Use Topics  . Smoking status: Current Every Day Smoker -- 1.00 packs/day    Types: Cigarettes  . Smokeless tobacco: Not on file  . Alcohol Use: No     Comment: once a month   OB History   Grav Para Term Preterm Abortions TAB SAB Ect Mult Living                 Review of Systems  Constitutional: Negative for fever and chills.  Gastrointestinal: Negative for nausea, abdominal pain and diarrhea.  Genitourinary: Negative for dysuria, urgency and flank pain.  Musculoskeletal: Positive for back pain and neck pain.  Skin: Negative for color change and rash.  Neurological: Negative for weakness and numbness.  All other systems reviewed and are negative.  Allergies  Review of patient's allergies indicates no known allergies.  Home Medications   Prior to Admission medications   Medication Sig Start Date End  Date Taking? Authorizing Provider  ibuprofen (ADVIL,MOTRIN) 200 MG tablet Take 600 mg by mouth every 6 (six) hours as needed for pain or headache.     Historical Provider, MD   Triage Vitals: BP 119/80  Pulse 87  Temp(Src) 98.3 F (36.8 C)  Resp 18  SpO2 98% Physical Exam  Nursing note and vitals reviewed. Constitutional: She is oriented to person, place, and time. She appears well-developed and well-nourished. No distress.  HENT:  Head: Normocephalic and atraumatic.  Eyes: Conjunctivae and EOM are normal.  Neck: Normal range of motion. Neck supple. No spinous process tenderness and no muscular tenderness present. Normal range of motion present.  Cardiovascular: Normal rate and regular rhythm.   Pulses:      Radial pulses are 2+ on the right side, and 2+ on the left side.  Pulmonary/Chest: Effort normal. No respiratory distress.  Musculoskeletal: Normal range of motion.       Lumbar back: She exhibits tenderness.       Back:  No midline C-spine, T-spine, or L-spine tenderness with no step-offs, crepitus, or deformities noted. Normal and equal grip strength bilaterally. Normal sensation and strength to bilateral lower extremities.   Neurological: She is alert and oriented to person, place, and time.  Skin: Skin is warm and dry.  Psychiatric: She has a normal mood and affect. Her behavior is normal.   ED Course  Procedures (including critical care time) DIAGNOSTIC STUDIES: Oxygen Saturation is 98% on RA, normal by my interpretation.    COORDINATION OF CARE: 9:20 AM-Discussed treatment plan which includes medication for pain with pt at bedside and pt agreed to plan.   Labs Review Labs Reviewed - No data to display  Imaging Review No results found.   EKG Interpretation None      MDM   Final diagnoses:  Low back pain with radiation, unspecified laterality  Neck pain   Patient with back pain, and neck pain, history of chronic back and neck pain.  No neurological  deficits and normal neuro exam. No loss of bowel or bladder control.  No concern for cauda equina.  No fever, night sweats, weight loss, h/o cancer, IVDU.  RICE protocol and pain medicine indicated and discussed with patient.  Meds given in ED:  Medications - No data to display  New Prescriptions   HYDROCODONE-ACETAMINOPHEN (NORCO/VICODIN) 5-325 MG PER TABLET    Take 1 tablet by mouth every 4 (four) hours as needed for moderate pain or severe pain.   IBUPROFEN (ADVIL,MOTRIN) 600 MG TABLET    Take 1 tablet (600 mg total) by mouth 3 (three) times daily.   I personally performed the services described in this documentation, which was scribed in my presence. The recorded information has been reviewed and is accurate.    Mellody DrownLauren Shelley Cocke, PA-C 10/04/14 [phone]

## 2014-10-04 NOTE — ED Notes (Signed)
Pt complaining of back pain radiating into buttock, legs and upward into cervical spine. sts hx of same but not this bad. sts taking tramadol and Vicodin without relief.

## 2014-10-05 NOTE — ED Provider Notes (Signed)
Medical screening examination/treatment/procedure(s) were performed by non-physician practitioner and as supervising physician I was immediately available for consultation/collaboration.   EKG Interpretation None       Derwood KaplanAnkit Amier Hoyt, MD 10/05/14 [phone]

## 2015-01-22 ENCOUNTER — Encounter (HOSPITAL_COMMUNITY): Payer: Self-pay | Admitting: Emergency Medicine

## 2015-01-22 ENCOUNTER — Emergency Department (HOSPITAL_COMMUNITY)
Admission: EM | Admit: 2015-01-22 | Discharge: 2015-01-22 | Disposition: A | Discharge status: Home / Self Care | Payer: Self-pay | Attending: Emergency Medicine | Admitting: Emergency Medicine

## 2015-01-22 ENCOUNTER — Emergency Department (HOSPITAL_COMMUNITY): Payer: Self-pay

## 2015-01-22 DIAGNOSIS — Z79899 Other long term (current) drug therapy: Secondary | ICD-10-CM | POA: Insufficient documentation

## 2015-01-22 DIAGNOSIS — Z8739 Personal history of other diseases of the musculoskeletal system and connective tissue: Secondary | ICD-10-CM | POA: Insufficient documentation

## 2015-01-22 DIAGNOSIS — Z8679 Personal history of other diseases of the circulatory system: Secondary | ICD-10-CM | POA: Insufficient documentation

## 2015-01-22 DIAGNOSIS — F419 Anxiety disorder, unspecified: Secondary | ICD-10-CM | POA: Insufficient documentation

## 2015-01-22 DIAGNOSIS — Z3202 Encounter for pregnancy test, result negative: Secondary | ICD-10-CM | POA: Insufficient documentation

## 2015-01-22 DIAGNOSIS — R0602 Shortness of breath: Secondary | ICD-10-CM | POA: Insufficient documentation

## 2015-01-22 DIAGNOSIS — R079 Chest pain, unspecified: Secondary | ICD-10-CM | POA: Insufficient documentation

## 2015-01-22 DIAGNOSIS — R05 Cough: Secondary | ICD-10-CM | POA: Insufficient documentation

## 2015-01-22 DIAGNOSIS — Z791 Long term (current) use of non-steroidal anti-inflammatories (NSAID): Secondary | ICD-10-CM | POA: Insufficient documentation

## 2015-01-22 DIAGNOSIS — R110 Nausea: Secondary | ICD-10-CM | POA: Insufficient documentation

## 2015-01-22 DIAGNOSIS — Z720 Tobacco use: Secondary | ICD-10-CM | POA: Insufficient documentation

## 2015-01-22 LAB — URINALYSIS, ROUTINE W REFLEX MICROSCOPIC
Bilirubin Urine: NEGATIVE
Glucose, UA: NEGATIVE mg/dL
Hgb urine dipstick: NEGATIVE
Ketones, ur: NEGATIVE mg/dL
NITRITE: NEGATIVE
PH: 7 (ref 5.0–8.0)
PROTEIN: NEGATIVE mg/dL
SPECIFIC GRAVITY, URINE: 1.025 (ref 1.005–1.030)
Urobilinogen, UA: 1 mg/dL (ref 0.0–1.0)

## 2015-01-22 LAB — TROPONIN I
Troponin I: <0.03 ng/mL (ref <0.031)
Troponin I: <0.03 ng/mL (ref <0.031)

## 2015-01-22 LAB — CBC WITH DIFFERENTIAL/PLATELET
Basophils Absolute: 0 10*3/uL (ref 0.0–0.1)
Basophils Relative: 0 % (ref 0–1)
Eosinophils Absolute: 0.1 10*3/uL (ref 0.0–0.7)
Eosinophils Relative: 1 % (ref 0–5)
HCT: 36.8 % (ref 36.0–46.0)
HEMOGLOBIN: 12.7 g/dL (ref 12.0–15.0)
LYMPHS PCT: 24 % (ref 12–46)
Lymphs Abs: 1.4 10*3/uL (ref 0.7–4.0)
MCH: 31.7 pg (ref 26.0–34.0)
MCHC: 34.5 g/dL (ref 30.0–36.0)
MCV: 91.8 fL (ref 78.0–100.0)
MONO ABS: 0.4 10*3/uL (ref 0.1–1.0)
Monocytes Relative: 7 % (ref 3–12)
Neutro Abs: 4.1 10*3/uL (ref 1.7–7.7)
Neutrophils Relative %: 68 % (ref 43–77)
Platelets: 205 10*3/uL (ref 150–400)
RBC: 4.01 MIL/uL (ref 3.87–5.11)
RDW: 12.1 % (ref 11.5–15.5)
WBC: 6 10*3/uL (ref 4.0–10.5)

## 2015-01-22 LAB — COMPREHENSIVE METABOLIC PANEL
ALBUMIN: 3.7 g/dL (ref 3.5–5.2)
ALK PHOS: 88 U/L (ref 39–117)
ALT: 24 U/L (ref 0–35)
AST: 22 U/L (ref 0–37)
Anion gap: 7 (ref 5–15)
BUN: 11 mg/dL (ref 6–23)
CO2: 22 mmol/L (ref 19–32)
Calcium: 8.5 mg/dL (ref 8.4–10.5)
Chloride: 106 mmol/L (ref 96–112)
Creatinine, Ser: 0.71 mg/dL (ref 0.50–1.10)
GFR calc Af Amer: >90 mL/min (ref >90)
GFR calc non Af Amer: >90 mL/min (ref >90)
Glucose, Bld: 96 mg/dL (ref 70–99)
Potassium: 4.3 mmol/L (ref 3.5–5.1)
Sodium: 135 mmol/L (ref 135–145)
TOTAL PROTEIN: 6.1 g/dL (ref 6.0–8.3)
Total Bilirubin: 0.6 mg/dL (ref 0.3–1.2)

## 2015-01-22 LAB — URINE MICROSCOPIC-ADD ON

## 2015-01-22 LAB — POC URINE PREG, ED: Preg Test, Ur: NEGATIVE

## 2015-01-22 MED ORDER — OXYCODONE-ACETAMINOPHEN 5-325 MG PO TABS
1.0000 | ORAL_TABLET | Freq: Once | ORAL | Status: AC
  Administered 2015-01-22: 1 via ORAL
  Filled 2015-01-22: qty 1

## 2015-01-22 MED ORDER — SODIUM CHLORIDE 0.9 % IV SOLN
INTRAVENOUS | Status: DC
  Administered 2015-01-22: 20 mL/h via INTRAVENOUS

## 2015-01-22 MED ORDER — LORAZEPAM 1 MG PO TABS
1.0000 mg | ORAL_TABLET | Freq: Once | ORAL | Status: AC
  Administered 2015-01-22: 1 mg via ORAL
  Filled 2015-01-22: qty 1

## 2015-01-22 MED ORDER — CEPHALEXIN 500 MG PO CAPS: 500.0000 mg | ORAL_CAPSULE | Freq: Four times a day (QID) | ORAL | Status: DC

## 2015-01-22 NOTE — Discharge Instructions (Signed)

## 2015-01-22 NOTE — ED Notes (Signed)
Per EMS: c/o chest pressure started around 1200 today, radiating to left arm and neck, with sob and nausea.  Hx of anxiety and bipolar.  Pain 9/10, given 2 ntg and 324 asa, pain is now 2/10.  12 lead unremarkable per EMS>

## 2015-01-22 NOTE — ED Notes (Signed)
Patient asking for pain medicine. MD made aware. Patient given water to give urine sample.

## 2015-01-22 NOTE — ED Notes (Signed)
MD Gentry at the bedside.  

## 2015-01-22 NOTE — ED Notes (Signed)
Patient returned from X-ray 

## 2015-01-22 NOTE — ED Provider Notes (Signed)
CSN: [id number]     Arrival date & time 01/22/15  1419 History   First MD Initiated Contact with Patient 01/22/15 1457     Chief Complaint  Patient presents with  . Chest Pain  . Shortness of Breath     (Consider location/radiation/quality/duration/timing/severity/associated sxs/prior Treatment) Patient is a 39 y.o. female presenting with chest pain.  Chest Pain Pain location:  Substernal area Pain quality: pressure   Pain radiates to:  L arm Pain severity:  Moderate Onset quality:  Sudden Duration:  4 hours Timing:  Constant Progression:  Unchanged Chronicity:  New Context: at rest and stress (emotional)   Relieved by:  Nothing Worsened by:  Exertion and deep breathing Associated symptoms: anxiety, cough (chronically, no change) and nausea   Associated symptoms: no abdominal pain, no fatigue, no fever, not vomiting and no weakness     Past Medical History  Diagnosis Date  . Depression   . Anxiety   . Bipolar 1 disorder   . Scoliosis   . Migraine    History reviewed. No pertinent past surgical history. Family History  Problem Relation Age of Onset  . Mental retardation Mother   . Hypertension Mother   . Hyperlipidemia Mother   . Heart disease Mother   . Depression Mother   . Hypertension Father   . Hyperlipidemia Father   . Heart disease Father   . Mental retardation Father   . Diabetes Father    History  Substance Use Topics  . Smoking status: Current Every Day Smoker -- 1.00 packs/day    Types: Cigarettes  . Smokeless tobacco: Not on file  . Alcohol Use: No     Comment: once a month   OB History    No data available     Review of Systems  Constitutional: Negative for fever and fatigue.  Respiratory: Positive for cough (chronically, no change).   Cardiovascular: Positive for chest pain.  Gastrointestinal: Positive for nausea. Negative for vomiting and abdominal pain.  Neurological: Negative for weakness.  All other systems reviewed and are  negative.     Allergies  Review of patient's allergies indicates no known allergies.  Home Medications   Prior to Admission medications   Medication Sig Start Date End Date Taking? Authorizing Provider  guaiFENesin (MUCINEX) 600 MG 12 hr tablet Take 600 mg by mouth 2 (two) times daily as needed for to loosen phlegm.   Yes Historical Provider, MD  loratadine (CLARITIN) 10 MG tablet Take 10 mg by mouth daily as needed for allergies.   Yes Historical Provider, MD  cephALEXin (KEFLEX) 500 MG capsule Take 1 capsule (500 mg total) by mouth 4 (four) times daily. Patient not taking: Reported on 01/24/2015 01/22/15   Mirian Mo, MD  HYDROcodone-acetaminophen (NORCO/VICODIN) 5-325 MG per tablet Take 1 tablet by mouth every 4 (four) hours as needed for moderate pain or severe pain. Patient not taking: Reported on 01/24/2015 10/04/14   Mellody Drown, PA-C  ibuprofen (ADVIL,MOTRIN) 600 MG tablet Take 1 tablet (600 mg total) by mouth 3 (three) times daily. 10/04/14   Lauren Parker, PA-C   BP 91/56 mmHg  Pulse 80  Temp(Src) 98.9 F (37.2 C) (Oral)  Resp 19  Ht  (1.549 m)  Wt 138 lb (62.596 kg)  BMI 26.09 kg/m2  SpO2 100% Physical Exam  Constitutional: She is oriented to person, place, and time. She appears well-developed and well-nourished.  HENT:  Head: Normocephalic and atraumatic.  Right Ear: External ear normal.  Left Ear:  External ear normal.  Eyes: Conjunctivae and EOM are normal. Pupils are equal, round, and reactive to light.  Neck: Normal range of motion. Neck supple.  Cardiovascular: Normal rate, regular rhythm, normal heart sounds and intact distal pulses.   Pulmonary/Chest: Effort normal and breath sounds normal.  Abdominal: Soft. Bowel sounds are normal. There is no tenderness.  Musculoskeletal: Normal range of motion.  Neurological: She is alert and oriented to person, place, and time.  Skin: Skin is warm and dry.  Vitals reviewed.   ED Course  Procedures (including  critical care time) Labs Review Labs Reviewed  URINALYSIS, ROUTINE W REFLEX MICROSCOPIC - Abnormal; Notable for the following:    APPearance HAZY (*)    Leukocytes, UA SMALL (*)    All other components within normal limits  URINE MICROSCOPIC-ADD ON - Abnormal; Notable for the following:    Squamous Epithelial / LPF MANY (*)    Bacteria, UA FEW (*)    All other components within normal limits  CBC WITH DIFFERENTIAL/PLATELET  COMPREHENSIVE METABOLIC PANEL  TROPONIN I  TROPONIN I  POC URINE PREG, ED    Imaging Review Dg Chest 2 View  01/22/2015   CLINICAL DATA:  Mid chest pain radiating into the throat for 1 day.  EXAM: CHEST  2 VIEW  COMPARISON:  Single view of the chest 08/08/2010  FINDINGS: The lungs are clear. Heart size is normal. No pneumothorax or pleural effusion. Convex left lumbar scoliosis is incompletely visualized.  IMPRESSION: No acute disease.   Electronically Signed   By: Drusilla Kannerhomas  Dalessio M.D.   On: 01/22/2015 15:20     EKG Interpretation   Date/Time:  Monday January 22 2015 14:21:25 EST Ventricular Rate:  66 PR Interval:  118 QRS Duration: 84 QT Interval:  396 QTC Calculation: 415 R Axis:   70 Text Interpretation:  Normal sinus rhythm Normal ECG No old tracing to  compare Confirmed by Mirian MoGentry, Matthew [phone]([id number]) on 01/22/2015 2:58:29 PM      MDM   Final diagnoses:  Chest pain, unspecified chest pain type  Anxiety    39 y.o. female with pertinent PMH of bipolar 1, anxiety presents with chest pain as above.  Symptoms atypical.  Physical exam benign.  Wu unremarkable, including delta troponin.  Likely anxiety, however pt was given strict return precautions, voiced understanding, and will fu with cardiology.  I have reviewed all laboratory and imaging studies if ordered as above  1. Chest pain, unspecified chest pain type   2. Chest pain   3. Anxiety         Mirian MoMatthew Gentry, MD 01/24/15 [phone]

## 2015-01-24 ENCOUNTER — Ambulatory Visit: Payer: Self-pay | Attending: Internal Medicine | Admitting: Internal Medicine

## 2015-01-24 ENCOUNTER — Encounter: Payer: Self-pay | Admitting: Internal Medicine

## 2015-01-24 VITALS — BP 115/82 | HR 86 | Temp 98.6°F | Resp 16 | Ht 61.0 in | Wt 147.0 lb

## 2015-01-24 DIAGNOSIS — G8929 Other chronic pain: Secondary | ICD-10-CM | POA: Insufficient documentation

## 2015-01-24 DIAGNOSIS — M545 Low back pain, unspecified: Secondary | ICD-10-CM

## 2015-01-24 DIAGNOSIS — Z720 Tobacco use: Secondary | ICD-10-CM

## 2015-01-24 DIAGNOSIS — F418 Other specified anxiety disorders: Secondary | ICD-10-CM

## 2015-01-24 DIAGNOSIS — Z23 Encounter for immunization: Secondary | ICD-10-CM

## 2015-01-24 DIAGNOSIS — F329 Major depressive disorder, single episode, unspecified: Secondary | ICD-10-CM | POA: Insufficient documentation

## 2015-01-24 DIAGNOSIS — F17210 Nicotine dependence, cigarettes, uncomplicated: Secondary | ICD-10-CM | POA: Insufficient documentation

## 2015-01-24 DIAGNOSIS — Z833 Family history of diabetes mellitus: Secondary | ICD-10-CM

## 2015-01-24 DIAGNOSIS — Z975 Presence of (intrauterine) contraceptive device: Secondary | ICD-10-CM

## 2015-01-24 DIAGNOSIS — F419 Anxiety disorder, unspecified: Secondary | ICD-10-CM

## 2015-01-24 DIAGNOSIS — Z139 Encounter for screening, unspecified: Secondary | ICD-10-CM

## 2015-01-24 DIAGNOSIS — F17200 Nicotine dependence, unspecified, uncomplicated: Secondary | ICD-10-CM

## 2015-01-24 DIAGNOSIS — F32A Depression, unspecified: Secondary | ICD-10-CM

## 2015-01-24 DIAGNOSIS — Z791 Long term (current) use of non-steroidal anti-inflammatories (NSAID): Secondary | ICD-10-CM | POA: Insufficient documentation

## 2015-01-24 DIAGNOSIS — R0981 Nasal congestion: Secondary | ICD-10-CM

## 2015-01-24 LAB — CBC WITH DIFFERENTIAL/PLATELET
BASOS PCT: 0 % (ref 0–1)
Basophils Absolute: 0 10*3/uL (ref 0.0–0.1)
EOS PCT: 1 % (ref 0–5)
Eosinophils Absolute: 0.1 10*3/uL (ref 0.0–0.7)
HCT: 37.8 % (ref 36.0–46.0)
Hemoglobin: 12.7 g/dL (ref 12.0–15.0)
Lymphocytes Relative: 25 % (ref 12–46)
Lymphs Abs: 1.7 10*3/uL (ref 0.7–4.0)
MCH: 31.1 pg (ref 26.0–34.0)
MCHC: 33.6 g/dL (ref 30.0–36.0)
MCV: 92.6 fL (ref 78.0–100.0)
MONO ABS: 0.4 10*3/uL (ref 0.1–1.0)
MONOS PCT: 6 % (ref 3–12)
MPV: 9.5 fL (ref 8.6–12.4)
NEUTROS PCT: 68 % (ref 43–77)
Neutro Abs: 4.5 10*3/uL (ref 1.7–7.7)
Platelets: 249 10*3/uL (ref 150–400)
RBC: 4.08 MIL/uL (ref 3.87–5.11)
RDW: 13 % (ref 11.5–15.5)
WBC: 6.6 10*3/uL (ref 4.0–10.5)

## 2015-01-24 LAB — COMPLETE METABOLIC PANEL WITH GFR
ALBUMIN: 3.9 g/dL (ref 3.5–5.2)
ALK PHOS: 83 U/L (ref 39–117)
ALT: 25 U/L (ref 0–35)
AST: 20 U/L (ref 0–37)
BUN: 8 mg/dL (ref 6–23)
CALCIUM: 9 mg/dL (ref 8.4–10.5)
CHLORIDE: 106 meq/L (ref 96–112)
CO2: 25 meq/L (ref 19–32)
Creat: 0.67 mg/dL (ref 0.50–1.10)
GLUCOSE: 88 mg/dL (ref 70–99)
POTASSIUM: 4.3 meq/L (ref 3.5–5.3)
Sodium: 141 mEq/L (ref 135–145)
TOTAL PROTEIN: 6.3 g/dL (ref 6.0–8.3)
Total Bilirubin: 0.4 mg/dL (ref 0.2–1.2)

## 2015-01-24 MED ORDER — BUPROPION HCL ER (SR) 150 MG PO TB12: 150.0000 mg | ORAL_TABLET | Freq: Two times a day (BID) | ORAL | Status: DC

## 2015-01-24 MED ORDER — FLUTICASONE PROPIONATE 50 MCG/ACT NA SUSP: 2.0000 | Freq: Every day | NASAL | Status: DC

## 2015-01-24 MED ORDER — LORATADINE 10 MG PO TABS: 10.0000 mg | ORAL_TABLET | Freq: Every day | ORAL | Status: DC | PRN

## 2015-01-24 MED ORDER — TRAMADOL HCL 50 MG PO TABS: 50.0000 mg | ORAL_TABLET | Freq: Three times a day (TID) | ORAL | Status: DC | PRN

## 2015-01-24 MED ORDER — GABAPENTIN 300 MG PO CAPS: 300.0000 mg | ORAL_CAPSULE | Freq: Three times a day (TID) | ORAL | Status: DC

## 2015-01-24 NOTE — Progress Notes (Signed)
Patient here to establish care Patient has history of lower back pain Pap smear several years old -normal as far as patient knows Patient declines flu shot Patient has h/o of bipolar 1, depression, anxiety and [facility] but has quit going  Because the Depakote made her gain wait Patient asking for medication to help control anxiety

## 2015-01-24 NOTE — Progress Notes (Signed)
Patient Demographics  Christina Morrison, is a 39 y.o. female  SWF:[id number]  [id number]  DOB - 1976-10-14  CC:  Chief Complaint  Patient presents with  . Establish Care       HPI: Christina Morrison is a 39 y.o. female here today to establish medical care.patient has history ofanxiety/depression, as per patient she was following up with the psychiatrist in the past and has tried different medications including Depakote which as per patient she stopped taking it because she gained weight but recently have been having symptoms of anxiety as well as depression, she is requesting some medication to help her with the symptoms, she also smokes cigarettes, advised patient to quit smoking, she's willing to try Wellbutrin which will help her to quit smoking and help her with her depression symptoms, she denies any SI or HI, she's also complaining of chronic lower back pain, as per patient she takes ibuprofen which does not help her with the symptoms. She also has lot of environmental allergy symptoms and is requesting prescription for Claritin. Patient has No headache, No chest pain, No abdominal pain - No Nausea, No new weakness tingling or numbness, No Cough - SOB.  No Known Allergies Past Medical History  Diagnosis Date  . Depression   . Anxiety   . Bipolar 1 disorder   . Scoliosis   . Migraine    Current Outpatient Prescriptions on File Prior to Visit  Medication Sig Dispense Refill  . guaiFENesin (MUCINEX) 600 MG 12 hr tablet Take 600 mg by mouth 2 (two) times daily as needed for to loosen phlegm.    [name] ibuprofen (ADVIL,MOTRIN) 600 MG tablet Take 1 tablet (600 mg total) by mouth 3 (three) times daily. 30 tablet 0  . cephALEXin (KEFLEX) 500 MG capsule Take 1 capsule (500 mg total) by mouth 4 (four) times daily. (Patient not taking: Reported on 01/24/2015) 28 capsule 0  . HYDROcodone-acetaminophen (NORCO/VICODIN) 5-325 MG per tablet Take 1 tablet by mouth every 4 (four) hours as needed for  moderate pain or severe pain. (Patient not taking: Reported on 01/24/2015) 10 tablet 0   No current facility-administered medications on file prior to visit.   Family History  Problem Relation Age of Onset  . Mental retardation Mother   . Hypertension Mother   . Hyperlipidemia Mother   . Heart disease Mother   . Depression Mother   . Hypertension Father   . Hyperlipidemia Father   . Heart disease Father   . Mental retardation Father   . Diabetes Father   . Cancer Maternal Aunt   . Stroke Maternal Grandmother    History   Social History  . Marital Status: Divorced    Spouse Name: N/A  . Number of Children: N/A  . Years of Education: N/A   Occupational History  . Not on file.   Social History Main Topics  . Smoking status: Current Every Day Smoker -- 1.00 packs/day    Types: Cigarettes  . Smokeless tobacco: Not on file  . Alcohol Use: No     Comment: once a month  . Drug Use: No  . Sexual Activity: Not on file   Other Topics Concern  . Not on file   Social History Narrative    Review of Systems: Constitutional: Negative for fever, chills, diaphoresis, activity change, appetite change and fatigue. HENT: Negative for ear pain, nosebleeds, congestion, facial swelling, rhinorrhea, neck pain, neck stiffness and ear discharge.  Eyes: Negative for pain, discharge, redness,  itching and visual disturbance. Respiratory: Negative for cough, choking, chest tightness, shortness of breath, wheezing and stridor.  Cardiovascular: Negative for chest pain, palpitations and leg swelling. Gastrointestinal: Negative for abdominal distention. Genitourinary: Negative for dysuria, urgency, frequency, hematuria, flank pain, decreased urine volume, difficulty urinating and dyspareunia.  Musculoskeletal: Negative for back pain, joint swelling, arthralgia and gait problem. Neurological: Negative for dizziness, tremors, seizures, syncope, facial asymmetry, speech difficulty, weakness,  light-headedness, numbness and headaches.  Hematological: Negative for adenopathy. Does not bruise/bleed easily. Psychiatric/Behavioral: Negative for hallucinations, behavioral problems, confusion, dysphoric mood, decreased concentration and agitation.    Objective:   Filed Vitals:   01/24/15 1452  BP: 115/82  Pulse: 86  Temp: 98.6 F (37 C)  Resp: 16    Physical Exam: Constitutional: Patient appears well-developed and well-nourished. No distress. HENT: Normocephalic, atraumatic, External right and left ear normal. Oropharynx is clear and moist.  Eyes: Conjunctivae and EOM are normal. PERRLA, no scleral icterus. Neck: Normal ROM. Neck supple. No JVD. No tracheal deviation. No thyromegaly. CVS: RRR, S1/S2 +, no murmurs, no gallops, no carotid bruit.  Pulmonary: Effort and breath sounds normal, no stridor, rhonchi, wheezes, rales.  Abdominal: Soft. BS +, no distension, tenderness, rebound or guarding.  Musculoskeletal: Normal range of motion. No edema and no tenderness.  Lymphadenopathy: No lymphadenopathy noted, cervical, inguinal or axillary Neuro: Alert. Normal reflexes, muscle tone coordination. No cranial nerve deficit. Skin: Skin is warm and dry. No rash noted. Not diaphoretic. No erythema. No pallor. Psychiatric: Normal mood and affect. Behavior, judgment, thought content normal.  Lab Results  Component Value Date   WBC 6.0 01/22/2015   HGB 12.7 01/22/2015   HCT 36.8 01/22/2015   MCV 91.8 01/22/2015   PLT 205 01/22/2015   Lab Results  Component Value Date   CREATININE 0.71 01/22/2015   BUN 11 01/22/2015   NA 135 01/22/2015   K 4.3 01/22/2015   CL 106 01/22/2015   CO2 22 01/22/2015    No results found for: HGBA1C Lipid Panel  No results found for: CHOL, TRIG, HDL, CHOLHDL, VLDL, LDLCALC     Assessment and plan:   1. Chronic lower back pain Advised patient to apply heating pad. - traMADol (ULTRAM) 50 MG tablet; Take 1 tablet (50 mg total) by mouth every 8  (eight) hours as needed for moderate pain.  Dispense: 30 tablet; Refill: 0 - gabapentin (NEURONTIN) 300 MG capsule; Take 1 capsule (300 mg total) by mouth 3 (three) times daily.  Dispense: 90 capsule; Refill: 3  2. Family history of diabetes mellitus (DM)  - Hemoglobin A1c  3. Stuffy nose  - loratadine (CLARITIN) 10 MG tablet; Take 1 tablet (10 mg total) by mouth daily as needed for allergies.  Dispense: 30 tablet; Refill: 3  4. IUD (intrauterine device) in place Patient has IUD for several years has not followed up with GYN. - Ambulatory referral to Gynecology  5. Anxiety and depression  - buPROPion (WELLBUTRIN SR) 150 MG 12 hr tablet; Take 1 tablet (150 mg total) by mouth 2 (two) times daily.  Dispense: 60 tablet; Refill: 3  6. Tobacco use disorder  - buPROPion (WELLBUTRIN SR) 150 MG 12 hr tablet; Take 1 tablet (150 mg total) by mouth 2 (two) times daily.  Dispense: 60 tablet; Refill: 3  7. Screening Ordered baseline blood work. - CBC with Differential/Platelet - COMPLETE METABOLIC PANEL WITH GFR - TSH - Vit D  25 hydroxy (rtn osteoporosis monitoring)   Health Maintenance  -Pap Smear: referred to  GYN  -Vaccinations:   Pneumovax today   Return in about 3 months (around 04/24/2015) for back pain, anxiety.   Doris CheadleADVANI, Rhylin Venters, MD

## 2015-01-24 NOTE — Progress Notes (Deleted)
Patient Demographics  Christina Morrison, is a 39 y.o. female  ZOX:[id number]  [id number]  DOB - 1976-11-05  CC:  Chief Complaint  Patient presents with  . Establish Care       HPI: Christina Morrison is a 39 y.o. female here today to establish medical care. Patient has No headache, No chest pain, No abdominal pain - No Nausea, No new weakness tingling or numbness, No Cough - SOB.  No Known Allergies Past Medical History  Diagnosis Date  . Depression   . Anxiety   . Bipolar 1 disorder   . Scoliosis   . Migraine    Current Outpatient Prescriptions on File Prior to Visit  Medication Sig Dispense Refill  . guaiFENesin (MUCINEX) 600 MG 12 hr tablet Take 600 mg by mouth 2 (two) times daily as needed for to loosen phlegm.    [name] ibuprofen (ADVIL,MOTRIN) 600 MG tablet Take 1 tablet (600 mg total) by mouth 3 (three) times daily. 30 tablet 0  . cephALEXin (KEFLEX) 500 MG capsule Take 1 capsule (500 mg total) by mouth 4 (four) times daily. (Patient not taking: Reported on 01/24/2015) 28 capsule 0  . HYDROcodone-acetaminophen (NORCO/VICODIN) 5-325 MG per tablet Take 1 tablet by mouth every 4 (four) hours as needed for moderate pain or severe pain. (Patient not taking: Reported on 01/24/2015) 10 tablet 0   No current facility-administered medications on file prior to visit.   Family History  Problem Relation Age of Onset  . Mental retardation Mother   . Hypertension Mother   . Hyperlipidemia Mother   . Heart disease Mother   . Depression Mother   . Hypertension Father   . Hyperlipidemia Father   . Heart disease Father   . Mental retardation Father   . Diabetes Father   . Cancer Maternal Aunt   . Stroke Maternal Grandmother    History   Social History  . Marital Status: Divorced    Spouse Name: N/A  . Number of Children: N/A  . Years of Education: N/A   Occupational History  . Not on file.   Social History Main Topics  . Smoking status: Current Every Day Smoker -- 1.00  packs/day    Types: Cigarettes  . Smokeless tobacco: Not on file  . Alcohol Use: No     Comment: once a month  . Drug Use: No  . Sexual Activity: Not on file   Other Topics Concern  . Not on file   Social History Narrative    Review of Systems: Constitutional: Negative for fever, chills, diaphoresis, activity change, appetite change and fatigue. HENT: Negative for ear pain, nosebleeds, congestion, facial swelling, rhinorrhea, neck pain, neck stiffness and ear discharge.  Eyes: Negative for pain, discharge, redness, itching and visual disturbance. Respiratory: Negative for cough, choking, chest tightness, shortness of breath, wheezing and stridor.  Cardiovascular: Negative for chest pain, palpitations and leg swelling. Gastrointestinal: Negative for abdominal distention. Genitourinary: Negative for dysuria, urgency, frequency, hematuria, flank pain, decreased urine volume, difficulty urinating and dyspareunia.  Musculoskeletal: Negative for back pain, joint swelling, arthralgia and gait problem. Neurological: Negative for dizziness, tremors, seizures, syncope, facial asymmetry, speech difficulty, weakness, light-headedness, numbness and headaches.  Hematological: Negative for adenopathy. Does not bruise/bleed easily. Psychiatric/Behavioral: Negative for hallucinations, behavioral problems, confusion, dysphoric mood, decreased concentration and agitation.    Objective:   Filed Vitals:   01/24/15 1452  BP: 115/82  Pulse: 86  Temp: 98.6 F (37 C)  Resp: 16  Physical Exam: Constitutional: Patient appears well-developed and well-nourished. No distress. HENT: Normocephalic, atraumatic, External right and left ear normal. Oropharynx is clear and moist.  Eyes: Conjunctivae and EOM are normal. PERRLA, no scleral icterus. Neck: Normal ROM. Neck supple. No JVD. No tracheal deviation. No thyromegaly. CVS: RRR, S1/S2 +, no murmurs, no gallops, no carotid bruit.  Pulmonary: Effort and  breath sounds normal, no stridor, rhonchi, wheezes, rales.  Abdominal: Soft. BS +, no distension, tenderness, rebound or guarding.  Musculoskeletal: Normal range of motion. No edema and no tenderness.  Neuro: Alert. Normal reflexes, muscle tone coordination. No cranial nerve deficit. Skin: Skin is warm and dry. No rash noted. Not diaphoretic. No erythema. No pallor. Psychiatric: Normal mood and affect. Behavior, judgment, thought content normal.  Lab Results  Component Value Date   WBC 6.0 01/22/2015   HGB 12.7 01/22/2015   HCT 36.8 01/22/2015   MCV 91.8 01/22/2015   PLT 205 01/22/2015   Lab Results  Component Value Date   CREATININE 0.71 01/22/2015   BUN 11 01/22/2015   NA 135 01/22/2015   K 4.3 01/22/2015   CL 106 01/22/2015   CO2 22 01/22/2015    No results found for: HGBA1C Lipid Panel  No results found for: CHOL, TRIG, HDL, CHOLHDL, VLDL, LDLCALC     Assessment and plan:   1. Chronic lower back pain *** - traMADol (ULTRAM) 50 MG tablet; Take 1 tablet (50 mg total) by mouth every 8 (eight) hours as needed for moderate pain.  Dispense: 30 tablet; Refill: 0 - gabapentin (NEURONTIN) 300 MG capsule; Take 1 capsule (300 mg total) by mouth 3 (three) times daily.  Dispense: 90 capsule; Refill: 3  2. Family history of diabetes mellitus (DM) ***  3. Stuffy nose *** - loratadine (CLARITIN) 10 MG tablet; Take 1 tablet (10 mg total) by mouth daily as needed for allergies.  Dispense: 30 tablet; Refill: 3  4. IUD (intrauterine device) in place *** - Ambulatory referral to Gynecology  5. Anxiety and depression *** - buPROPion (WELLBUTRIN SR) 150 MG 12 hr tablet; Take 1 tablet (150 mg total) by mouth 2 (two) times daily.  Dispense: 60 tablet; Refill: 3  6. Tobacco use disorder *** - buPROPion (WELLBUTRIN SR) 150 MG 12 hr tablet; Take 1 tablet (150 mg total) by mouth 2 (two) times daily.  Dispense: 60 tablet; Refill: 3       Health Maintenance  -Pap Smear: referred to  GYN  -Vaccinations:  -pneumova today     Return in about 3 months (around 04/24/2015) for back pain.    The patient was given clear instructions to go to ER or return to medical center if symptoms don't improve, worsen or new problems develop. The patient verbalized understanding. The patient was told to call to get lab results if they haven't heard anything in the next week.     Doris CheadleADVANI, Ioannis Schuh, MD

## 2015-01-25 ENCOUNTER — Telehealth: Payer: Self-pay

## 2015-01-25 ENCOUNTER — Encounter: Payer: Self-pay | Admitting: Obstetrics & Gynecology

## 2015-01-25 LAB — HEMOGLOBIN A1C
Hgb A1c MFr Bld: 4.9 % (ref <5.7)
Mean Plasma Glucose: 94 mg/dL (ref <117)

## 2015-01-25 LAB — TSH: TSH: 0.568 u[IU]/mL (ref 0.350–4.500)

## 2015-01-25 LAB — VITAMIN D 25 HYDROXY (VIT D DEFICIENCY, FRACTURES): Vit D, 25-Hydroxy: 9 ng/mL — ABNORMAL LOW (ref 30–100)

## 2015-01-25 MED ORDER — VITAMIN D (ERGOCALCIFEROL) 1.25 MG (50000 UNIT) PO CAPS: 50000.0000 [IU] | ORAL_CAPSULE | ORAL | Status: DC

## 2015-01-25 NOTE — Telephone Encounter (Signed)
Patient is aware of her lab results [facility]  

## 2015-01-25 NOTE — Telephone Encounter (Signed)
-----   Message from Doris Cheadleeepak Advani, MD sent at 01/25/2015  9:19 AM EST ----- Blood work reviewed, noticed low vitamin D, call patient advise to start ergocalciferol 50,000 units once a week for the duration of  12 weeks.

## 2015-02-28 ENCOUNTER — Telehealth: Payer: Self-pay | Admitting: General Practice

## 2015-02-28 NOTE — Telephone Encounter (Signed)
Patient requesting med refill for traMADol (ULTRAM) 50 MG tablet  Please assist

## 2015-03-01 ENCOUNTER — Emergency Department (HOSPITAL_COMMUNITY)
Admission: EM | Admit: 2015-03-01 | Discharge: 2015-03-01 | Discharge status: Against Medical Advice | Payer: Self-pay | Attending: Emergency Medicine | Admitting: Emergency Medicine

## 2015-03-01 ENCOUNTER — Ambulatory Visit: Payer: Self-pay | Admitting: Obstetrics & Gynecology

## 2015-03-01 ENCOUNTER — Telehealth: Payer: Self-pay | Admitting: Internal Medicine

## 2015-03-01 ENCOUNTER — Encounter (HOSPITAL_COMMUNITY): Payer: Self-pay | Admitting: Emergency Medicine

## 2015-03-01 DIAGNOSIS — R079 Chest pain, unspecified: Secondary | ICD-10-CM | POA: Insufficient documentation

## 2015-03-01 DIAGNOSIS — Z720 Tobacco use: Secondary | ICD-10-CM | POA: Insufficient documentation

## 2015-03-01 DIAGNOSIS — F419 Anxiety disorder, unspecified: Secondary | ICD-10-CM | POA: Insufficient documentation

## 2015-03-01 LAB — BASIC METABOLIC PANEL
Anion gap: 11 (ref 5–15)
BUN: 8 mg/dL (ref 6–23)
CALCIUM: 9 mg/dL (ref 8.4–10.5)
CHLORIDE: 106 mmol/L (ref 96–112)
CO2: 21 mmol/L (ref 19–32)
Creatinine, Ser: 0.92 mg/dL (ref 0.50–1.10)
GFR calc non Af Amer: 78 mL/min — ABNORMAL LOW (ref >90)
Glucose, Bld: 104 mg/dL — ABNORMAL HIGH (ref 70–99)
POTASSIUM: 4.1 mmol/L (ref 3.5–5.1)
Sodium: 138 mmol/L (ref 135–145)

## 2015-03-01 LAB — CBC
HCT: 38 % (ref 36.0–46.0)
Hemoglobin: 13.2 g/dL (ref 12.0–15.0)
MCH: 31 pg (ref 26.0–34.0)
MCHC: 34.7 g/dL (ref 30.0–36.0)
MCV: 89.2 fL (ref 78.0–100.0)
Platelets: 246 K/uL (ref 150–400)
RBC: 4.26 MIL/uL (ref 3.87–5.11)
RDW: 12 % (ref 11.5–15.5)
WBC: 7.2 K/uL (ref 4.0–10.5)

## 2015-03-01 LAB — I-STAT TROPONIN, ED: Troponin i, poc: 0.01 ng/mL (ref 0.00–0.08)

## 2015-03-01 NOTE — Telephone Encounter (Signed)
Patient is calling to request a med refill for Tramadol, please f/u with pt.

## 2015-03-01 NOTE — ED Notes (Signed)
Pt arrives crying, red faced, c/o chest pain and stressors at home. Attributes pain from anxiety. EKG done at triage. VSS.

## 2015-03-20 ENCOUNTER — Inpatient Hospital Stay (HOSPITAL_COMMUNITY)
Admission: EM | Admit: 2015-03-20 | Discharge: 2015-03-23 | DRG: 249 | Disposition: A | Discharge status: Home / Self Care | Payer: Self-pay | Attending: Cardiovascular Disease | Admitting: Cardiovascular Disease

## 2015-03-20 ENCOUNTER — Encounter (HOSPITAL_COMMUNITY): Payer: Self-pay | Admitting: *Deleted

## 2015-03-20 ENCOUNTER — Emergency Department (HOSPITAL_COMMUNITY): Payer: Self-pay

## 2015-03-20 DIAGNOSIS — F319 Bipolar disorder, unspecified: Secondary | ICD-10-CM | POA: Diagnosis present

## 2015-03-20 DIAGNOSIS — F329 Major depressive disorder, single episode, unspecified: Secondary | ICD-10-CM | POA: Diagnosis present

## 2015-03-20 DIAGNOSIS — I249 Acute ischemic heart disease, unspecified: Secondary | ICD-10-CM | POA: Diagnosis present

## 2015-03-20 DIAGNOSIS — Z79899 Other long term (current) drug therapy: Secondary | ICD-10-CM

## 2015-03-20 DIAGNOSIS — I214 Non-ST elevation (NSTEMI) myocardial infarction: Principal | ICD-10-CM | POA: Diagnosis present

## 2015-03-20 DIAGNOSIS — R112 Nausea with vomiting, unspecified: Secondary | ICD-10-CM | POA: Diagnosis present

## 2015-03-20 DIAGNOSIS — Z955 Presence of coronary angioplasty implant and graft: Secondary | ICD-10-CM

## 2015-03-20 DIAGNOSIS — Z791 Long term (current) use of non-steroidal anti-inflammatories (NSAID): Secondary | ICD-10-CM

## 2015-03-20 DIAGNOSIS — M419 Scoliosis, unspecified: Secondary | ICD-10-CM | POA: Diagnosis present

## 2015-03-20 DIAGNOSIS — G8929 Other chronic pain: Secondary | ICD-10-CM | POA: Diagnosis present

## 2015-03-20 DIAGNOSIS — F419 Anxiety disorder, unspecified: Secondary | ICD-10-CM | POA: Diagnosis present

## 2015-03-20 DIAGNOSIS — I200 Unstable angina: Secondary | ICD-10-CM

## 2015-03-20 DIAGNOSIS — F1410 Cocaine abuse, uncomplicated: Secondary | ICD-10-CM

## 2015-03-20 DIAGNOSIS — M545 Low back pain: Secondary | ICD-10-CM

## 2015-03-20 DIAGNOSIS — Z975 Presence of (intrauterine) contraceptive device: Secondary | ICD-10-CM

## 2015-03-20 DIAGNOSIS — Z9861 Coronary angioplasty status: Secondary | ICD-10-CM

## 2015-03-20 DIAGNOSIS — F17210 Nicotine dependence, cigarettes, uncomplicated: Secondary | ICD-10-CM | POA: Diagnosis present

## 2015-03-20 DIAGNOSIS — I2510 Atherosclerotic heart disease of native coronary artery without angina pectoris: Secondary | ICD-10-CM | POA: Diagnosis present

## 2015-03-20 DIAGNOSIS — Z8249 Family history of ischemic heart disease and other diseases of the circulatory system: Secondary | ICD-10-CM

## 2015-03-20 DIAGNOSIS — F17200 Nicotine dependence, unspecified, uncomplicated: Secondary | ICD-10-CM | POA: Diagnosis present

## 2015-03-20 HISTORY — DX: Unspecified osteoarthritis, unspecified site: M19.90

## 2015-03-20 HISTORY — DX: Other psychoactive substance abuse, uncomplicated: F19.10

## 2015-03-20 HISTORY — DX: Atherosclerotic heart disease of native coronary artery without angina pectoris: I25.10

## 2015-03-20 HISTORY — DX: Transient cerebral ischemic attack, unspecified: G45.9

## 2015-03-20 LAB — CBC WITH DIFFERENTIAL/PLATELET
BASOS PCT: 0 % (ref 0–1)
Basophils Absolute: 0 10*3/uL (ref 0.0–0.1)
EOS PCT: 0 % (ref 0–5)
Eosinophils Absolute: 0 10*3/uL (ref 0.0–0.7)
HCT: 39.9 % (ref 36.0–46.0)
HEMOGLOBIN: 14 g/dL (ref 12.0–15.0)
Lymphocytes Relative: 16 % (ref 12–46)
Lymphs Abs: 1.5 10*3/uL (ref 0.7–4.0)
MCH: 31 pg (ref 26.0–34.0)
MCHC: 35.1 g/dL (ref 30.0–36.0)
MCV: 88.3 fL (ref 78.0–100.0)
MONO ABS: 0.5 10*3/uL (ref 0.1–1.0)
MONOS PCT: 5 % (ref 3–12)
Neutro Abs: 7.2 10*3/uL (ref 1.7–7.7)
Neutrophils Relative %: 78 % — ABNORMAL HIGH (ref 43–77)
Platelets: 253 10*3/uL (ref 150–400)
RBC: 4.52 MIL/uL (ref 3.87–5.11)
RDW: 12 % (ref 11.5–15.5)
WBC: 9.2 10*3/uL (ref 4.0–10.5)

## 2015-03-20 LAB — COMPREHENSIVE METABOLIC PANEL
ALK PHOS: 114 U/L (ref 39–117)
ALT: 35 U/L (ref 0–35)
ANION GAP: 10 (ref 5–15)
AST: 32 U/L (ref 0–37)
Albumin: 4.5 g/dL (ref 3.5–5.2)
BILIRUBIN TOTAL: 0.5 mg/dL (ref 0.3–1.2)
BUN: 9 mg/dL (ref 6–23)
CHLORIDE: 107 mmol/L (ref 96–112)
CO2: 17 mmol/L — AB (ref 19–32)
Calcium: 8.8 mg/dL (ref 8.4–10.5)
Creatinine, Ser: 0.67 mg/dL (ref 0.50–1.10)
GLUCOSE: 109 mg/dL — AB (ref 70–99)
POTASSIUM: 4.5 mmol/L (ref 3.5–5.1)
SODIUM: 134 mmol/L — AB (ref 135–145)
Total Protein: 7.6 g/dL (ref 6.0–8.3)

## 2015-03-20 LAB — I-STAT TROPONIN, ED
Troponin i, poc: 0.01 ng/mL (ref 0.00–0.08)
Troponin i, poc: 0.3 ng/mL (ref 0.00–0.08)

## 2015-03-20 LAB — D-DIMER, QUANTITATIVE (NOT AT ARMC)

## 2015-03-20 LAB — LIPASE, BLOOD: LIPASE: 25 U/L (ref 11–59)

## 2015-03-20 LAB — TROPONIN I: Troponin I: 0.62 ng/mL (ref <0.031)

## 2015-03-20 LAB — PROTIME-INR
INR: 1.05 (ref 0.00–1.49)
Prothrombin Time: 13.8 seconds (ref 11.6–15.2)

## 2015-03-20 LAB — APTT: aPTT: 149 seconds — ABNORMAL HIGH (ref 24–37)

## 2015-03-20 MED ORDER — GI COCKTAIL ~~LOC~~
30.0000 mL | Freq: Once | ORAL | Status: AC
  Administered 2015-03-20: 30 mL via ORAL
  Filled 2015-03-20: qty 30

## 2015-03-20 MED ORDER — ACETAMINOPHEN 325 MG PO TABS: 650.0000 mg | ORAL_TABLET | ORAL | Status: DC | PRN

## 2015-03-20 MED ORDER — NITROGLYCERIN 0.4 MG SL SUBL
0.4000 mg | SUBLINGUAL_TABLET | SUBLINGUAL | Status: DC | PRN
  Administered 2015-03-22 (×3): 0.4 mg via SUBLINGUAL
  Filled 2015-03-20 (×2): qty 1

## 2015-03-20 MED ORDER — ASPIRIN 81 MG PO CHEW
324.0000 mg | CHEWABLE_TABLET | Freq: Once | ORAL | Status: AC
Start: 2015-03-20 — End: 2015-03-20
  Administered 2015-03-20: 324 mg via ORAL
  Filled 2015-03-20: qty 4

## 2015-03-20 MED ORDER — ALPRAZOLAM 0.25 MG PO TABS
0.2500 mg | ORAL_TABLET | Freq: Two times a day (BID) | ORAL | Status: DC | PRN
  Administered 2015-03-21 – 2015-03-23 (×5): 0.25 mg via ORAL
  Filled 2015-03-20 (×5): qty 1

## 2015-03-20 MED ORDER — ATORVASTATIN CALCIUM 80 MG PO TABS
80.0000 mg | ORAL_TABLET | Freq: Every day | ORAL | Status: DC
  Administered 2015-03-21 – 2015-03-23 (×4): 80 mg via ORAL
  Filled 2015-03-20 (×4): qty 1

## 2015-03-20 MED ORDER — HEPARIN (PORCINE) IN NACL 100-0.45 UNIT/ML-% IJ SOLN
700.0000 [IU]/h | INTRAMUSCULAR | Status: DC
  Administered 2015-03-20: 700 [IU]/h via INTRAVENOUS
  Filled 2015-03-20: qty 250

## 2015-03-20 MED ORDER — METOPROLOL TARTRATE 25 MG PO TABS
25.0000 mg | ORAL_TABLET | Freq: Two times a day (BID) | ORAL | Status: DC
  Administered 2015-03-21 – 2015-03-22 (×3): 25 mg via ORAL
  Filled 2015-03-20 (×3): qty 1

## 2015-03-20 MED ORDER — LORAZEPAM 1 MG PO TABS
1.0000 mg | ORAL_TABLET | Freq: Once | ORAL | Status: AC
  Administered 2015-03-20: 1 mg via ORAL
  Filled 2015-03-20: qty 1

## 2015-03-20 MED ORDER — ACETAMINOPHEN 325 MG PO TABS: 650.0000 mg | ORAL_TABLET | Freq: Once | ORAL | Status: DC

## 2015-03-20 MED ORDER — MORPHINE SULFATE 4 MG/ML IJ SOLN
4.0000 mg | Freq: Once | INTRAMUSCULAR | Status: AC
Start: 2015-03-20 — End: 2015-03-20
  Administered 2015-03-20: 4 mg via INTRAVENOUS
  Filled 2015-03-20: qty 1

## 2015-03-20 MED ORDER — HEPARIN BOLUS VIA INFUSION
3700.0000 [IU] | Freq: Once | INTRAVENOUS | Status: AC
  Administered 2015-03-20: 3700 [IU] via INTRAVENOUS
  Filled 2015-03-20: qty 3700

## 2015-03-20 MED ORDER — ONDANSETRON HCL 4 MG/2ML IJ SOLN: 4.0000 mg | Freq: Four times a day (QID) | INTRAMUSCULAR | Status: DC | PRN

## 2015-03-20 MED ORDER — SODIUM CHLORIDE 0.9 % IV SOLN
INTRAVENOUS | Status: DC
Start: 2015-03-21 — End: 2015-03-21
  Administered 2015-03-21: 01:00:00 via INTRAVENOUS

## 2015-03-20 MED ORDER — ASPIRIN EC 81 MG PO TBEC
81.0000 mg | DELAYED_RELEASE_TABLET | Freq: Every day | ORAL | Status: DC
  Administered 2015-03-22 – 2015-03-23 (×2): 81 mg via ORAL
  Filled 2015-03-20 (×3): qty 1

## 2015-03-20 NOTE — ED Provider Notes (Signed)
CSN: [id number]     Arrival date & time 03/20/15  1449 History   First MD Initiated Contact with Patient 03/20/15 1507     Chief Complaint  Patient presents with  . Chest Pain     (Consider location/radiation/quality/duration/timing/severity/associated sxs/prior Treatment) HPI Christina Morrison is a 39 y.o. female with hx of Depression, anxiety, migraines, chronic back pain, presents to ED with complaint of chest pain. Pt statse this pain started at 11am and woke her up from sleep. States pain is retrosternal, sharp, "severe," radiating into the jaw and into bilateral arms. States this is the 5th time she has had this pain in the last 2 moths. She has been seen here twice for the same in the last 2 moths, first time she was worked up and instructed to follow up with wellness center. States she did, but states "they didn't do anything." Second time pt came here she did not want to wait and ended up leaving before being seen. Pt states today she reports feeling "clammy" with this pain, has shortness of breath, reports nausea and 1 episode of vomiting. Mother states "we are here to find an answer for her pain and we will not take 'i dont know' as an answer." Mother requests admission. Pt states pain has been constant since 11am this morning. Denies acid reflux symptoms. No cough or congestion. No fever. She appears anxious but denies that being the cause of her pain. I reviewed the chart, when pt followed up with [facility], pt did not mention to them she had any chest pain. Pt requesting pain medications.   Past Medical History  Diagnosis Date  . Depression   . Anxiety   . Bipolar 1 disorder   . Scoliosis   . Migraine   . Osteoarthritis    History reviewed. No pertinent past surgical history. Family History  Problem Relation Age of Onset  . Mental retardation Mother   . Hypertension Mother   . Hyperlipidemia Mother   . Heart disease Mother   . Depression Mother   . Hypertension Father    . Hyperlipidemia Father   . Heart disease Father   . Mental retardation Father   . Diabetes Father   . Cancer Maternal Aunt   . Stroke Maternal Grandmother    History  Substance Use Topics  . Smoking status: Current Every Day Smoker -- 1.00 packs/day    Types: Cigarettes  . Smokeless tobacco: Not on file  . Alcohol Use: No     Comment: once a month   OB History    No data available     Review of Systems  Constitutional: Positive for diaphoresis. Negative for fever and chills.  HENT: Negative.   Respiratory: Positive for chest tightness and shortness of breath. Negative for cough and wheezing.   Cardiovascular: Positive for chest pain. Negative for palpitations and leg swelling.  Gastrointestinal: Positive for nausea and vomiting. Negative for abdominal pain and diarrhea.  Genitourinary: Negative for dysuria, flank pain and pelvic pain.  Musculoskeletal: Negative for myalgias, arthralgias, neck pain and neck stiffness.  Skin: Negative for rash.  Neurological: Positive for dizziness and light-headedness. Negative for weakness and headaches.  All other systems reviewed and are negative.     Allergies  Review of patient's allergies indicates no known allergies.  Home Medications   Prior to Admission medications   Medication Sig Start Date End Date Taking? Authorizing Provider  buPROPion (WELLBUTRIN SR) 150 MG 12 hr tablet Take 1 tablet (  150 mg total) by mouth 2 (two) times daily. 01/24/15  Yes Deepak Advani, MD  fluticasone (FLONASE) 50 MCG/ACT nasal spray Place 2 sprays into both nostrils daily. 01/24/15  Yes Doris Cheadleeepak Advani, MD  gabapentin (NEURONTIN) 300 MG capsule Take 1 capsule (300 mg total) by mouth 3 (three) times daily. 01/24/15  Yes Doris Cheadleeepak Advani, MD  guaiFENesin (MUCINEX) 600 MG 12 hr tablet Take 600 mg by mouth 2 (two) times daily as needed for to loosen phlegm.   Yes Historical Provider, MD  ibuprofen (ADVIL,MOTRIN) 600 MG tablet Take 1 tablet (600 mg total) by  mouth 3 (three) times daily. 10/04/14  Yes Mellody DrownLauren Parker, PA-C  loratadine (CLARITIN) 10 MG tablet Take 1 tablet (10 mg total) by mouth daily as needed for allergies. 01/24/15  Yes Doris Cheadleeepak Advani, MD  Sertraline HCl (ZOLOFT PO) Take 1 tablet by mouth daily.   Yes Historical Provider, MD  HYDROcodone-acetaminophen (NORCO/VICODIN) 5-325 MG per tablet Take 1 tablet by mouth every 4 (four) hours as needed for moderate pain or severe pain. Patient not taking: Reported on 01/24/2015 10/04/14   Mellody DrownLauren Parker, PA-C   BP 153/102 mmHg  Pulse 79  Temp(Src) 98.6 F (37 C) (Oral)  Resp 18  Ht 5\' 1"  (1.549 m)  Wt 140 lb (63.504 kg)  BMI 26.47 kg/m2  SpO2 100%  LMP 02/27/2015 Physical Exam  Constitutional: She is oriented to person, place, and time. She appears well-developed and well-nourished. No distress.  HENT:  Head: Normocephalic.  Eyes: Conjunctivae are normal.  Neck: Neck supple.  Cardiovascular: Normal rate, regular rhythm and normal heart sounds.   Pulmonary/Chest: Effort normal and breath sounds normal. No respiratory distress. She has no wheezes. She has no rales. She exhibits no tenderness.  Abdominal: Soft. Bowel sounds are normal. She exhibits no distension. There is no tenderness. There is no rebound.  Musculoskeletal: She exhibits no edema.  Neurological: She is alert and oriented to person, place, and time.  Skin: Skin is warm and dry.  Psychiatric: She has a normal mood and affect. Her behavior is normal.  Nursing note and vitals reviewed.   ED Course  Procedures (including critical care time) Labs Review Labs Reviewed  CBC WITH DIFFERENTIAL/PLATELET - Abnormal; Notable for the following:    Neutrophils Relative % 78 (*)    All other components within normal limits  COMPREHENSIVE METABOLIC PANEL - Abnormal; Notable for the following:    Sodium 134 (*)    CO2 17 (*)    Glucose, Bld 109 (*)    All other components within normal limits  TROPONIN I - Abnormal; Notable for the  following:    Troponin I 0.62 (*)    All other components within normal limits  APTT - Abnormal; Notable for the following:    aPTT 149 (*)    All other components within normal limits  I-STAT TROPOININ, ED - Abnormal; Notable for the following:    Troponin i, poc 0.30 (*)    All other components within normal limits  LIPASE, BLOOD  D-DIMER, QUANTITATIVE  PROTIME-INR  HEPARIN LEVEL (UNFRACTIONATED)  CBC  URINE RAPID DRUG SCREEN (HOSP PERFORMED)  Rosezena SensorI-STAT TROPOININ, ED    Imaging Review Dg Chest 2 View  03/20/2015   CLINICAL DATA:  Acute chest pain.  EXAM: CHEST  2 VIEW  COMPARISON:  January 22, 2015.  FINDINGS: The heart size and mediastinal contours are within normal limits. Both lungs are clear. No pneumothorax or pleural effusion is noted. The visualized skeletal structures are  unremarkable.  IMPRESSION: No active cardiopulmonary disease.   Electronically Signed   By: Lupita Raider, M.D.   On: 03/20/2015 15:53     EKG Interpretation   Date/Time:  Tuesday March 20 2015 14:50:28 EDT Ventricular Rate:  73 PR Interval:  108 QRS Duration: 88 QT Interval:  382 QTC Calculation: 421 R Axis:   57 Text Interpretation:  Sinus rhythm Short PR interval No significant change  since last tracing Reconfirmed by WARD,  DO, KRISTEN [phone]) on 03/20/2015  2:57:52 PM      MDM   Final diagnoses:  NSTEMI (non-ST elevated myocardial infarction)    Patient is here with constant chest pain and started 11:00. Pain is sharp. Worsened with breathing. Reports shortness of breath. Reports dizziness diaphoresis. Reports nausea, one episode of vomiting. She [facility]. She has not any prior cardiac workup except for the labs, EKG, chest x-ray here in emergency department. Today's EKG is normal. Patient appears to be hypertensive, she is tearful, requesting pain medications. She does have family history of heart disease, father died from heart  attack at early age. Patient is a smoker. No history of hypertension, diabetes, high cholesterol. Patient's heart scores 1, low suspicion for coronary disease. I suspect her symptoms are most likely from acid reflux versus anxiety. Will get labs, including troponin, chest x-ray, d-dimer to rule out PE. Ativan, GI cocktail, aspirin ordered.  4:54 PM Pt feeling better after ativan and GI cocktail. Labs all normal. CXR normal. Discussed with cardiology, will get a follow up with in a week, will call tomorrow.   8:00 PM Troponin elevated, started heparin, ordered sublingual nitroglycerin. Patient just received formula and some morphine for her pain. We will contact cardiology for admission.  8:47 PM Spoke with cardiology, they will come by and see patient. Patient is refusing nitroglycerin. Stating that she will get a headache, offered her Tylenol which she refused as well. Cardiology consult pending.  Filed Vitals:   03/20/15 2100 03/20/15 2130 03/20/15 2215 03/20/15 2245  BP: 105/72 123/83 112/69 107/63  Pulse: 85 80 73 75  Temp:      TempSrc:      Resp: Height:      Weight:      SpO2: 94% 97% 99% 98%     Jaynie Crumble, PA-C 03/20/15 2323  Gilda Crease, MD 03/20/15 2325

## 2015-03-20 NOTE — Progress Notes (Signed)
ANTICOAGULATION CONSULT NOTE - Initial Consult  Pharmacy Consult for heparin Indication: chest pain/ACS  No Known Allergies  Patient Measurements: Height: 5\' 1"  (154.9 cm) Weight: 140 lb (63.504 kg) IBW/kg (Calculated) : 47.8 Heparin Dosing Weight: 60.8 kg  Vital Signs: Temp: 98.6 F (37 C) (04/05 1452) Temp Source: Oral (04/05 1452) BP: 124/72 mmHg (04/05 1945) Pulse Rate: 87 (04/05 1945)  Labs:  Recent Labs  03/20/15 1501 03/20/15 1827  HGB 14.0  --   HCT 39.9  --   PLT 253  --   CREATININE 0.67  --   TROPONINI  --  0.62*    Estimated Creatinine Clearance: 81.4 mL/min (by C-G formula based on Cr of 0.67).   Medical History: Past Medical History  Diagnosis Date  . Depression   . Anxiety   . Bipolar 1 disorder   . Scoliosis   . Migraine   . Osteoarthritis     Medications:   (Not in a hospital admission)  Assessment: [age] yoF who presents with retrosternal chest pain with radiation to arms and jaw. Pharmacy consulted to dose heparin for ACS. No anticoagulant PTA. EKG normal, troponins 0.30 and 0.62, H/H wnl, platelets 253, CrCl ~80-85 ml/min  Goal of Therapy:  Heparin level 0.3-0.7 units/ml Monitor platelets by anticoagulation protocol: Yes   Plan:  Heparin 3700 unit IV bolus, then 700 unit/hr 6-hr HL (0200) Daily HL and CBC Monitor for signs of bleeding  Conception ChancyShah, Anilah Huck D 03/20/2015,8:00 PM

## 2015-03-20 NOTE — ED Notes (Signed)
Pt states that she does not want nitro for chest pain, pain 8/10

## 2015-03-20 NOTE — ED Notes (Signed)
PA in room

## 2015-03-20 NOTE — ED Notes (Signed)
Elevated troponin of .30 reported to [place]-PA

## 2015-03-20 NOTE — H&P (Signed)
Cardiology History and Physical  PCP: Lorayne Marek, MD Cardiologist: None  History of Present Illness (and review of medical records): Christina Morrison is a 39 y.o. female who presents for evaluation of chest pain.  She has no prior hx of MI or known CAD or cardiomyopathy.  She has hx of tobacco abuse, family history of CAD ( Father passed of MI in 46s) bipolar disorder, migraines, and chronic pain.  She reports midsternal chest pain that began today at rest around 1130am.  Pain was described as heavy, pressure like, aching pain.  Pain radiated to both arms, throat and jaw.  This was associated with nausea and vomiting, along with shortness of breath and feeling clammy.  Pain was rated 12/10.  The took a zoloft and advil with no relief.  She denies any other alleviating or aggravating factors.  As pain did not go away, she called EMS and was brought to ED for further evaluation.  She had no ST elevation on ekg.  She was given ASA, Gi cocktail, NTG, and morphine with some relief.  She had initial POC troponin of 0.01, followed by 0.30. Serum trop was 0.62.  She was started on Heparin gtt and cardiology was called.  Of note patient reports similar episodes in past 2 months.  She was initially seen in ED here 01/22/15 with negative ecg and troponins.  She returned on 03/01/15, but left before being evaluated.  She had another similar episode of chest pain last week Wed, but did not report to ED.  Previous diagnostic testing for coronary artery disease includes: none. Previous history of cardiac disease includes None. Coronary artery disease risk factors include: family history of premature cardiovascular disease and smoking/ tobacco exposure.  Patient denies history of cardiomyopathy, CHF, coronary artery disease, coronary artery stent, previous M.I. and valvular disease.  Review of Systems A comprehensive review of systems was negative except for: Constitutional: positive for malaise Neurological: positive  for headaches Behavioral/Psych: positive for anxiety, bipolar, depression and tobacco use Further review of systems was otherwise negative other than stated in HPI.  Patient Active Problem List   Diagnosis Date Noted  . ACS (acute coronary syndrome) 03/20/2015  . Chronic lower back pain 01/24/2015  . Family history of diabetes mellitus (DM) 01/24/2015  . Anxiety and depression 01/24/2015  . Tobacco use disorder 01/24/2015  . IUD (intrauterine device) in place 01/24/2015   Past Medical History  Diagnosis Date  . Depression   . Anxiety   . Bipolar 1 disorder   . Scoliosis   . Migraine   . Osteoarthritis     History reviewed. No pertinent past surgical history.   (Not in a hospital admission) No Known Allergies  History  Substance Use Topics  . Smoking status: Current Every Day Smoker -- 1.00 packs/day    Types: Cigarettes  . Smokeless tobacco: Not on file  . Alcohol Use: No     Comment: once a month    Family History  Problem Relation Age of Onset  . Mental retardation Mother   . Hypertension Mother   . Hyperlipidemia Mother   . Heart disease Mother   . Depression Mother   . Hypertension Father   . Hyperlipidemia Father   . Heart disease Father   . Mental retardation Father   . Diabetes Father   . Cancer Maternal Aunt   . Stroke Maternal Grandmother      Objective:  Patient Vitals for the past 8 hrs:  BP Temp Temp src  Pulse Resp SpO2 Height Weight  03/20/15 2030 111/68 mmHg - - 88 18 100 % - -  03/20/15 2016 116/70 mmHg - - 86 17 99 % - -  03/20/15 1945 124/72 mmHg - - 87 19 99 % - -  03/20/15 1930 112/75 mmHg - - - 25 - - -  03/20/15 1915 119/70 mmHg - - - 16 - - -  03/20/15 1845 122/73 mmHg - - 87 18 100 % - -  03/20/15 1830 112/62 mmHg - - 85 18 98 % - -  03/20/15 1800 109/70 mmHg - - 75 13 99 % - -  03/20/15 1745 122/74 mmHg - - 78 13 100 % - -  03/20/15 1730 98/64 mmHg - - 71 17 100 % - -  03/20/15 1700 123/81 mmHg - - 74 15 100 % - -  03/20/15 1645  134/81 mmHg - - 78 18 100 % - -  03/20/15 1630 - - - 93 - 100 % - -  03/20/15 1615 166/90 mmHg - - - 19 - - -  03/20/15 1455 - - - - - 100 % - -  03/20/15 1452 (!) 153/102 mmHg 98.6 F (37 C) Oral 79 18 99 % _0  (1.549 m) 63.504 kg (140 lb)   General appearance: alert, cooperative, appears stated age and mild distress Head: Normocephalic, without obvious abnormality, atraumatic Eyes: conjunctivae/corneas clear. PERRL, EOM's intact. Neck: no carotid bruit, no JVD and supple, Lungs: clear to auscultation bilaterally Chest wall: no tenderness Heart: regular rate and rhythm, S1, S2 normal, no murmur, click, rub or gallop Abdomen: soft, non-tender; bowel sounds normal Extremities: extremities normal, atraumatic, no cyanosis or edema Pulses: 2+ and symmetric Neurologic: Grossly normal  Results for orders placed or performed during the hospital encounter of 03/20/15 (from the past 48 hour(s))  CBC with Differential     Status: Abnormal   Collection Time: 03/20/15  3:01 PM  Result Value Ref Range   WBC 9.2 4.0 - 10.5 K/uL   RBC 4.52 3.87 - 5.11 MIL/uL   Hemoglobin 14.0 12.0 - 15.0 g/dL   HCT 39.9 36.0 - 46.0 %   MCV 88.3 78.0 - 100.0 fL   MCH 31.0 26.0 - 34.0 pg   MCHC 35.1 30.0 - 36.0 g/dL   RDW 12.0 11.5 - 15.5 %   Platelets 253 150 - 400 K/uL   Neutrophils Relative % 78 (H) 43 - 77 %   Neutro Abs 7.2 1.7 - 7.7 K/uL   Lymphocytes Relative 16 12 - 46 %   Lymphs Abs 1.5 0.7 - 4.0 K/uL   Monocytes Relative 5 3 - 12 %   Monocytes Absolute 0.5 0.1 - 1.0 K/uL   Eosinophils Relative 0 0 - 5 %   Eosinophils Absolute 0.0 0.0 - 0.7 K/uL   Basophils Relative 0 0 - 1 %   Basophils Absolute 0.0 0.0 - 0.1 K/uL  Comprehensive metabolic panel     Status: Abnormal   Collection Time: 03/20/15  3:01 PM  Result Value Ref Range   Sodium 134 (L) 135 - 145 mmol/L   Potassium 4.5 3.5 - 5.1 mmol/L   Chloride 107 96 - 112 mmol/L   CO2 17 (L) 19 - 32 mmol/L   Glucose, Bld 109 (H) 70 - 99 mg/dL    BUN 9 6 - 23 mg/dL   Creatinine, Ser 0.67 0.50 - 1.10 mg/dL   Calcium 8.8 8.4 - 10.5 mg/dL   Total Protein 7.6 6.0 -  8.3 g/dL   Albumin 4.5 3.5 - 5.2 g/dL   AST 32 0 - 37 U/L   ALT 35 0 - 35 U/L   Alkaline Phosphatase 114 39 - 117 U/L   Total Bilirubin 0.5 0.3 - 1.2 mg/dL   GFR calc non Af Amer >90 >90 mL/min   GFR calc Af Amer >90 >90 mL/min    Comment: (NOTE) The eGFR has been calculated using the CKD EPI equation. This calculation has not been validated in all clinical situations. eGFR's persistently <90 mL/min signify possible Chronic Kidney Disease.    Anion gap 10 5 - 15  Lipase, blood     Status: None   Collection Time: 03/20/15  3:01 PM  Result Value Ref Range   Lipase 25 11 - 59 U/L  D-dimer, quantitative     Status: None   Collection Time: 03/20/15  3:01 PM  Result Value Ref Range   D-Dimer, Quant <0.27 0.00 - 0.48 ug/mL-FEU    Comment:        AT THE INHOUSE ESTABLISHED CUTOFF VALUE OF 0.48 ug/mL FEU, THIS ASSAY HAS BEEN DOCUMENTED IN THE LITERATURE TO HAVE A SENSITIVITY AND NEGATIVE PREDICTIVE VALUE OF AT LEAST 98 TO 99%.  THE TEST RESULT SHOULD BE CORRELATED WITH AN ASSESSMENT OF THE CLINICAL PROBABILITY OF DVT / VTE.   I-Stat Troponin, ED (not at [facility])     Status: None   Collection Time: 03/20/15  3:36 PM  Result Value Ref Range   Troponin i, poc 0.01 0.00 - 0.08 ng/mL   Comment 3            Comment: Due to the release kinetics of cTnI, a negative result within the first hours of the onset of symptoms does not rule out myocardial infarction with certainty. If myocardial infarction is still suspected, repeat the test at appropriate intervals.   I-Stat Troponin, ED (not at [facility])     Status: Abnormal   Collection Time: 03/20/15  6:03 PM  Result Value Ref Range   Troponin i, poc 0.30 (HH) 0.00 - 0.08 ng/mL   Comment NOTIFIED PHYSICIAN    Comment 3            Comment: Due to the release kinetics of cTnI, a negative result within the first hours of the  onset of symptoms does not rule out myocardial infarction with certainty. If myocardial infarction is still suspected, repeat the test at appropriate intervals.   Troponin I     Status: Abnormal   Collection Time: 03/20/15  6:27 PM  Result Value Ref Range   Troponin I 0.62 (HH) <0.031 ng/mL    Comment:        POSSIBLE MYOCARDIAL ISCHEMIA. SERIAL TESTING RECOMMENDED. REPEATED TO VERIFY CRITICAL RESULT CALLED TO, READ BACK BY AND VERIFIED WITH: E BRADICK,RN 1948 03/20/15 WBOND    Dg Chest 2 View  03/20/2015   CLINICAL DATA:  Acute chest pain.  EXAM: CHEST  2 VIEW  COMPARISON:  January 22, 2015.  FINDINGS: The heart size and mediastinal contours are within normal limits. Both lungs are clear. No pneumothorax or pleural effusion is noted. The visualized skeletal structures are unremarkable.  IMPRESSION: No active cardiopulmonary disease.   Electronically Signed   By: Marijo Conception, M.D.   On: 03/20/2015 15:53    ECG:   HR 73, sinus rhythm, short PR 108, no acute ischemic changes. No changes from prior ecgs.  Assessment: 52F hx of tobacco use and FHx of premature  CAD presents with midsternal chest pain lasting at least 2hrs prior to arrival, with no acute EKG changes, and troponins of 0.01, 0.30. 0.62 in ED.  Will admit to inpatient for further evaluation and management of suspect ACS/NSTEMI  ACS/NSTEMI Tobacco abuse Bipolar d/o Chronic Back Pain  Plan: 1. Cardiology Admission  2. Continuous monitoring on Telemetry. 3. Repeat ekg on admit, prn chest pain or arrythmia 4. Trend cardiac biomarkers, check lipids, hgba1c, tsh. Check UDS. 5. Medical management to include ASA,Heparin gtt, BB, Statin, Morphine prn 6. NPO, Gentle IVFs 7. Likely plan for further invasive evaluation with cardiac catheterization.  Patient agreeable to proceed.

## 2015-03-20 NOTE — ED Notes (Signed)
PA at the bedside.

## 2015-03-20 NOTE — ED Notes (Signed)
PA at bedside.

## 2015-03-20 NOTE — ED Notes (Signed)
Per EMs- pt presents c/o centralized chest pain radiating to both arms and jaw beginning at 11am.  Pt states she has has this pain before but gets worse everytime it happens.  EKG unremarkable, 4mg  Zofran given en route.  BP-170/102 P-98 R-18.  Pt a x 4, NAD.

## 2015-03-20 NOTE — ED Notes (Signed)
Pt refused Nitro again, PA went into room and explained use of Nitro, pt still refusing nitro

## 2015-03-21 ENCOUNTER — Encounter (HOSPITAL_COMMUNITY): Payer: Self-pay | Admitting: General Practice

## 2015-03-21 ENCOUNTER — Ambulatory Visit: Payer: Self-pay | Admitting: Obstetrics & Gynecology

## 2015-03-21 ENCOUNTER — Other Ambulatory Visit (HOSPITAL_COMMUNITY): Payer: Self-pay

## 2015-03-21 ENCOUNTER — Encounter (HOSPITAL_COMMUNITY)
Admission: EM | Disposition: A | Discharge status: Home / Self Care | Payer: Medicaid Other | Source: Home / Self Care | Attending: Cardiovascular Disease

## 2015-03-21 DIAGNOSIS — I2510 Atherosclerotic heart disease of native coronary artery without angina pectoris: Secondary | ICD-10-CM

## 2015-03-21 HISTORY — PX: CARDIAC CATHETERIZATION: SHX172

## 2015-03-21 HISTORY — PX: CORONARY STENT PLACEMENT: SHX1402

## 2015-03-21 HISTORY — PX: LEFT HEART CATHETERIZATION WITH CORONARY ANGIOGRAM: SHX5451

## 2015-03-21 LAB — LIPID PANEL
Cholesterol: 182 mg/dL (ref 0–200)
HDL: 40 mg/dL (ref >39)
LDL Cholesterol: 111 mg/dL — ABNORMAL HIGH (ref 0–99)
Total CHOL/HDL Ratio: 4.6 RATIO
Triglycerides: 157 mg/dL — ABNORMAL HIGH (ref <150)
VLDL: 31 mg/dL (ref 0–40)

## 2015-03-21 LAB — BASIC METABOLIC PANEL
ANION GAP: 6 (ref 5–15)
BUN: 8 mg/dL (ref 6–23)
CHLORIDE: 108 mmol/L (ref 96–112)
CO2: 24 mmol/L (ref 19–32)
Calcium: 8.4 mg/dL (ref 8.4–10.5)
Creatinine, Ser: 0.72 mg/dL (ref 0.50–1.10)
GFR calc non Af Amer: >90 mL/min (ref >90)
Glucose, Bld: 86 mg/dL (ref 70–99)
POTASSIUM: 3.9 mmol/L (ref 3.5–5.1)
Sodium: 138 mmol/L (ref 135–145)

## 2015-03-21 LAB — CBC
HCT: 37.8 % (ref 36.0–46.0)
Hemoglobin: 13 g/dL (ref 12.0–15.0)
MCH: 31 pg (ref 26.0–34.0)
MCHC: 34.4 g/dL (ref 30.0–36.0)
MCV: 90 fL (ref 78.0–100.0)
PLATELETS: 228 10*3/uL (ref 150–400)
RBC: 4.2 MIL/uL (ref 3.87–5.11)
RDW: 12.4 % (ref 11.5–15.5)
WBC: 6.6 10*3/uL (ref 4.0–10.5)

## 2015-03-21 LAB — RAPID URINE DRUG SCREEN, HOSP PERFORMED
Amphetamines: NOT DETECTED
Barbiturates: NOT DETECTED
Benzodiazepines: NOT DETECTED
Cocaine: POSITIVE — AB
Opiates: NOT DETECTED
TETRAHYDROCANNABINOL: NOT DETECTED

## 2015-03-21 LAB — PREGNANCY, URINE: Preg Test, Ur: NEGATIVE

## 2015-03-21 LAB — PROTIME-INR
INR: 1 (ref 0.00–1.49)
PROTHROMBIN TIME: 13.3 s (ref 11.6–15.2)

## 2015-03-21 LAB — TROPONIN I
TROPONIN I: 3.03 ng/mL — AB (ref <0.031)
Troponin I: 3.24 ng/mL (ref <0.031)
Troponin I: 1.86 ng/mL (ref <0.031)

## 2015-03-21 LAB — POCT ACTIVATED CLOTTING TIME: ACTIVATED CLOTTING TIME: 472 s

## 2015-03-21 LAB — APTT: aPTT: 35 seconds (ref 24–37)

## 2015-03-21 LAB — TSH: TSH: 2.944 u[IU]/mL (ref 0.350–4.500)

## 2015-03-21 SURGERY — LEFT HEART CATHETERIZATION WITH CORONARY ANGIOGRAM

## 2015-03-21 MED ORDER — SODIUM CHLORIDE 0.9 % IJ SOLN: 3.0000 mL | INTRAMUSCULAR | Status: DC | PRN

## 2015-03-21 MED ORDER — SODIUM CHLORIDE 0.9 % IV SOLN: 1.0000 mL/kg/h | INTRAVENOUS | Status: AC

## 2015-03-21 MED ORDER — SODIUM CHLORIDE 0.9 % IV SOLN: 250.0000 mL | INTRAVENOUS | Status: DC | PRN

## 2015-03-21 MED ORDER — ASPIRIN 81 MG PO CHEW: 81.0000 mg | CHEWABLE_TABLET | ORAL | Status: DC

## 2015-03-21 MED ORDER — FENTANYL CITRATE 0.05 MG/ML IJ SOLN
INTRAMUSCULAR | Status: AC
  Filled 2015-03-21: qty 2

## 2015-03-21 MED ORDER — CLOPIDOGREL BISULFATE 75 MG PO TABS
75.0000 mg | ORAL_TABLET | Freq: Every day | ORAL | Status: DC
  Administered 2015-03-22 – 2015-03-23 (×2): 75 mg via ORAL
  Filled 2015-03-21 (×3): qty 1

## 2015-03-21 MED ORDER — SODIUM CHLORIDE 0.9 % IV BOLUS (SEPSIS)
250.0000 mL | Freq: Once | INTRAVENOUS | Status: AC
  Administered 2015-03-21: 250 mL via INTRAVENOUS

## 2015-03-21 MED ORDER — SODIUM CHLORIDE 0.9 % IJ SOLN
3.0000 mL | Freq: Two times a day (BID) | INTRAMUSCULAR | Status: DC
  Administered 2015-03-21: 22:00:00 3 mL via INTRAVENOUS

## 2015-03-21 MED ORDER — HEPARIN SODIUM (PORCINE) 1000 UNIT/ML IJ SOLN
INTRAMUSCULAR | Status: AC
  Filled 2015-03-21: qty 1

## 2015-03-21 MED ORDER — VERAPAMIL HCL 2.5 MG/ML IV SOLN
INTRAVENOUS | Status: AC
  Filled 2015-03-21: qty 2

## 2015-03-21 MED ORDER — ONDANSETRON HCL 4 MG/2ML IJ SOLN
INTRAMUSCULAR | Status: AC
  Filled 2015-03-21: qty 2

## 2015-03-21 MED ORDER — CLOPIDOGREL BISULFATE 300 MG PO TABS
ORAL_TABLET | ORAL | Status: AC
  Filled 2015-03-21: qty 1

## 2015-03-21 MED ORDER — NITROGLYCERIN 1 MG/10 ML FOR IR/CATH LAB
INTRA_ARTERIAL | Status: AC
  Filled 2015-03-21: qty 10

## 2015-03-21 MED ORDER — HEPARIN (PORCINE) IN NACL 2-0.9 UNIT/ML-% IJ SOLN
INTRAMUSCULAR | Status: AC
  Filled 2015-03-21: qty 1000

## 2015-03-21 MED ORDER — MORPHINE SULFATE 2 MG/ML IJ SOLN
2.0000 mg | Freq: Once | INTRAMUSCULAR | Status: AC
  Administered 2015-03-21: 2 mg via INTRAVENOUS
  Filled 2015-03-21: qty 1

## 2015-03-21 MED ORDER — MIDAZOLAM HCL 2 MG/2ML IJ SOLN
INTRAMUSCULAR | Status: AC
  Filled 2015-03-21: qty 2

## 2015-03-21 MED ORDER — SODIUM CHLORIDE 0.9 % IJ SOLN: 3.0000 mL | Freq: Two times a day (BID) | INTRAMUSCULAR | Status: DC

## 2015-03-21 MED ORDER — OXYCODONE-ACETAMINOPHEN 5-325 MG PO TABS
1.0000 | ORAL_TABLET | ORAL | Status: DC | PRN
  Administered 2015-03-21 – 2015-03-23 (×9): 2 via ORAL
  Filled 2015-03-21 (×9): qty 2

## 2015-03-21 MED ORDER — BIVALIRUDIN 250 MG IV SOLR
INTRAVENOUS | Status: AC
  Filled 2015-03-21: qty 250

## 2015-03-21 MED ORDER — LIDOCAINE HCL (PF) 1 % IJ SOLN
INTRAMUSCULAR | Status: AC
  Filled 2015-03-21: qty 30

## 2015-03-21 NOTE — ED Notes (Addendum)
Pt has unplugged herself from monitor and is ambulating in hallway, informed pt that she is on bed rest and needs to stay in bed and on the monitor. Pt states that she has been in the room too long and she unplugged herself and wants to walk around.

## 2015-03-21 NOTE — Progress Notes (Signed)
Report called to Laura,RN by Dorothyann GibbsNeely.RN. Rt radial site remains soft with no active bleeding or hematoma noted. VSS. Patient transported to 6C04 at this time.

## 2015-03-21 NOTE — ED Notes (Signed)
Spoke with Cardiology concerning pt low BP; new orders placed; Md notified of chest pain still 7/10, no orders for pain meds given at this time; Pt will be first case for cath lab this am;

## 2015-03-21 NOTE — Interval H&P Note (Signed)
History and Physical Interval Note:  03/21/2015 9:57 AM  Christina Morrison  has presented today for surgery, with the diagnosis of non stemi  The various methods of treatment have been discussed with the patient and family. After consideration of risks, benefits and other options for treatment, the patient has consented to  Procedure(s): LEFT HEART CATHETERIZATION WITH CORONARY ANGIOGRAM (N/A) as a surgical intervention .  The patient's history has been reviewed, patient examined, no change in status, stable for surgery.  I have reviewed the patient's chart and labs.  Questions were answered to the patient's satisfaction.    Cath Lab Visit (complete for each Cath Lab visit)  Clinical Evaluation Leading to the Procedure:   ACS: Yes.    Non-ACS:    Anginal Classification: CCS IV  Anti-ischemic medical therapy: No Therapy  Non-Invasive Test Results: No non-invasive testing performed  Prior CABG: No previous CABG       Tonny BollmanMichael Laydon Martis

## 2015-03-21 NOTE — ED Notes (Signed)
Spoke with admitting MD, pt requesting pain medicine

## 2015-03-21 NOTE — Progress Notes (Signed)
Report received from Laura,RN. Rt Radial Site WNL. Patient resting at this time. Will continue to monitor.

## 2015-03-21 NOTE — Progress Notes (Signed)
UR COMPLETED  

## 2015-03-21 NOTE — ED Notes (Signed)
Lab called with critical troponin-- 3.24-- will notify cath lab.

## 2015-03-21 NOTE — CV Procedure (Signed)
Cardiac Catheterization Procedure Note  Name: Christina Morrison MRN: [id number] DOB: 1976/10/31  Procedure: Left Heart Cath, Selective Coronary Angiography, LV angiography, stenting of the first diagonal  Indication: NSTEMI. 39 year old woman who presented with non-ST elevation MI. She has had ongoing chest pain and her troponin is elevated. She tested positive for cocaine. She presents for cardiac catheterization and possible PCI.  Procedural Details:  The right wrist was prepped, draped, and anesthetized with 1% lidocaine. Using the modified Seldinger technique, a 5/6 French Slender sheath was introduced into the right radial artery. 3 mg of verapamil was administered through the sheath, weight-based unfractionated heparin was administered intravenously. Standard Judkins catheters were used for selective coronary angiography and left ventriculography. Catheter exchanges were performed over an exchange length guidewire.  PROCEDURAL FINDINGS Hemodynamics: AO 96/60 with a mean of 74 LV 96/20   Coronary angiography: Coronary dominance: right  Left mainstem: Patent with 20% ostial stenosis. The vessel divides into the LAD and left circumflex.  Left anterior descending (LAD): The LAD is patent and it wraps around the LV apex. The vessel has mild diffuse disease. There is segmental plaquing in the proximal and mid LAD with 20-30% stenosis. After administration of intracoronary nitroglycerin, LAD is a much larger vessel. There is 30-40% stenosis present in the mid LAD at the origin of a large septal perforator branch. There is also 30-40% stenosis in the proximal LAD at the origin of the first diagonal. The first diagonal branch has 40-50% ostial stenosis and 90% proximal vessel stenosis.  Left circumflex (LCx): The left circumflex is diffusely diseased. The vessel has a long segment 50% proximal left circumflex stenosis. The bifurcation of the mid left circumflex and first obtuse marginal branch  also has 50% stenosis. The OM branches are patent.  Right coronary artery (RCA): The right coronary artery is dominant. The mid RCA has 40-50% stenosis. The remainder of the vessel is smooth. The PDA and PLA branches are patent.  Left ventriculography: Left ventricular systolic function is vigorous, LVEF is estimated at 65-70%, there is no significant mitral regurgitation   PCI Note:  Following the diagnostic procedure, the decision was made to proceed with PCI of the diagonal. There was severe focal 90% stenosis worse after IC NTG. Otherwise there was diffuse plaquing but no tight stenosis. The patient was loaded with Plavix 600 mg. Weight-based bivalirudin was given for anticoagulation. Once a therapeutic ACT was achieved, a 5 [place] EBU 3.0 guide catheter was inserted.  A cougar coronary guidewire was used to cross the lesion.   The lesion was then primarily stented with a 2.75 x 12 mm Multilink Vision BMS.  The stent was postdilated with a 3.0 mm noncompliant balloon.  Following PCI, there was 0% residual stenosis and TIMI-3 flow. Final angiography confirmed an excellent result. The patient tolerated the procedure well. There were no immediate procedural complications. A TR band was used for radial hemostasis. The patient was transferred to the post catheterization recovery area for further monitoring.  PCI Data: Vessel - Diagonal 1 Percent Stenosis (pre)  90 TIMI-flow 3 Stent 2.75x12 mm Vision BMS Percent Stenosis (post) 0 TIMI-flow (post) 3  Contrast: 160  Estimated Blood Loss: minimal  Final Conclusions:   1. Severe single-vessel coronary artery disease with successful PCI using a bare-metal stent platform 2. Moderate diffuse coronary artery disease as outlined above 3. Vigorous LV systolic function   Recommendations:  Dual antiplatelet therapy with aspirin and Plavix for a minimum of 30 days, but favor one  year if tolerated since this patient presented with non-ST elevation MI. She  will need aggressive risk reduction. Substance abuse needs to be addressed.  [name] MD, [facility] 03/21/2015, 10:05 AM

## 2015-03-21 NOTE — Progress Notes (Signed)
TR BAND REMOVAL  LOCATION:    right radial  DEFLATED PER PROTOCOL:    Yes.    TIME BAND OFF / DRESSING APPLIED:    1600   SITE UPON ARRIVAL:    Level 0  SITE AFTER BAND REMOVAL:    Level 0   CIRCULATION SENSATION AND MOVEMENT:    Within Normal Limits   Yes.    COMMENTS:   Checked at 1630 with no change in assessment. Dressing dry and intact

## 2015-03-22 ENCOUNTER — Encounter (HOSPITAL_COMMUNITY): Payer: Self-pay | Admitting: Cardiovascular Disease

## 2015-03-22 DIAGNOSIS — I249 Acute ischemic heart disease, unspecified: Secondary | ICD-10-CM

## 2015-03-22 LAB — HEMOGLOBIN A1C
Hgb A1c MFr Bld: 5.2 % (ref 4.8–5.6)
Mean Plasma Glucose: 103 mg/dL

## 2015-03-22 LAB — CBC
HCT: 34 % — ABNORMAL LOW (ref 36.0–46.0)
Hemoglobin: 11.5 g/dL — ABNORMAL LOW (ref 12.0–15.0)
MCH: 30.7 pg (ref 26.0–34.0)
MCHC: 33.8 g/dL (ref 30.0–36.0)
MCV: 90.7 fL (ref 78.0–100.0)
Platelets: 183 10*3/uL (ref 150–400)
RBC: 3.75 MIL/uL — ABNORMAL LOW (ref 3.87–5.11)
RDW: 12.4 % (ref 11.5–15.5)
WBC: 6.2 10*3/uL (ref 4.0–10.5)

## 2015-03-22 LAB — LIPID PANEL
CHOL/HDL RATIO: 4.1 ratio
CHOLESTEROL: 154 mg/dL (ref 0–200)
HDL: 38 mg/dL — AB (ref >39)
LDL Cholesterol: 92 mg/dL (ref 0–99)
Triglycerides: 119 mg/dL (ref <150)
VLDL: 24 mg/dL (ref 0–40)

## 2015-03-22 LAB — BASIC METABOLIC PANEL WITH GFR
Anion gap: 4 — ABNORMAL LOW (ref 5–15)
BUN: 5 mg/dL — ABNORMAL LOW (ref 6–23)
CO2: 25 mmol/L (ref 19–32)
Calcium: 8.4 mg/dL (ref 8.4–10.5)
Chloride: 109 mmol/L (ref 96–112)
Creatinine, Ser: 0.7 mg/dL (ref 0.50–1.10)
GFR calc Af Amer: >90 mL/min
GFR calc non Af Amer: >90 mL/min
Glucose, Bld: 84 mg/dL (ref 70–99)
Potassium: 4.1 mmol/L (ref 3.5–5.1)
Sodium: 138 mmol/L (ref 135–145)

## 2015-03-22 LAB — TROPONIN I
Troponin I: 1.22 ng/mL
Troponin I: 1.34 ng/mL (ref <0.031)
Troponin I: 1.44 ng/mL (ref <0.031)

## 2015-03-22 MED ORDER — DILTIAZEM HCL ER COATED BEADS 120 MG PO TB24
120.0000 mg | ORAL_TABLET | Freq: Every day | ORAL | Status: DC
  Administered 2015-03-22 – 2015-03-23 (×2): 120 mg via ORAL
  Filled 2015-03-22 (×2): qty 1

## 2015-03-22 MED ORDER — ISOSORBIDE MONONITRATE ER 30 MG PO TB24
30.0000 mg | ORAL_TABLET | Freq: Every day | ORAL | Status: DC
  Administered 2015-03-22 – 2015-03-23 (×2): 30 mg via ORAL
  Filled 2015-03-22 (×2): qty 1

## 2015-03-22 MED FILL — Sodium Chloride IV Soln 0.9%: INTRAVENOUS | Qty: 50 | Status: AC

## 2015-03-22 NOTE — Progress Notes (Signed)
Patient has left the unit twice after being told not to do so.  In each case security was notified.  In each case she returned to room on her own with 15 minutes.

## 2015-03-22 NOTE — Progress Notes (Signed)
At 0035 pt complained of 9/10 chest pain upon waking. No radiation, sob or nausea. BP 107/55. EKG performed(NSR, no ST changes seen)  and 2 sl ntg given at 0040 and 0045. Pt stated pain now at a 2/10. BP 88/54. Dr. Shirlee LatchMclean notified of above and trop x 3 ordered. First trop at 0138 is 1.22 (previously 1.86). Pt then got oob against advice to walk around the halls.  Pt stated no chest pain or sob when returned to bed, but asking to stay an extra night in the hospital to "just rest."   BP is presently 101/62 and no c/o any chest pain since previous episode at 0035.

## 2015-03-22 NOTE — Progress Notes (Addendum)
CARDIAC REHAB PHASE I   Pt declined to walk this morning, states she is tired and had chest pain this morning and "does not want to have chest pain." Reviewed MI/stent education including anti-platelet therapy, NTG use, activity restrictions, exercise guidelines, tobacco cessation, and diet. Pt stated she "snuck out to smoke last night." Gave pt fake cigarette.  Pt verbalizes readiness to quit smoking, expressed interest in nicotine patch. [facility] but does not have [facility]. Gave pt financial assistance form. Will sent referral to [place]. Encouraged pt to walk with RN this morning, pt agrees.   [phone]   Joylene GrapesMonge, Elbridge Magowan C, RN, BSN 03/22/2015 9:14 AM

## 2015-03-22 NOTE — Progress Notes (Signed)
Subjective:  Had recurrent chest pain last night similar to admission chest pain. required 2 NTG for relief   Objective:   Vital Signs : Filed Vitals:   03/22/15 0046 03/22/15 0055 03/22/15 0500 03/22/15 0809  BP: 100/59 88/54 101/62 111/77  Pulse:   61 72  Temp:   97.7 F (36.5 C) 98.2 F (36.8 C)  TempSrc:   Oral Oral  Resp: 21 16 20 18   Height:      Weight:      SpO2:   100% 100%    Intake/Output from previous day:  Intake/Output Summary (Last 24 hours) at 03/22/15 0945 Last data filed at 03/22/15 0500  Gross per 24 hour  Intake 1512.67 ml  Output      0 ml  Net 1512.67 ml   Net:  I/O since admission:  Wt Readings from Last 3 Encounters:  03/22/15 141 lb 1.5 oz (64 kg)  01/24/15 147 lb (66.679 kg)  01/22/15 138 lb (62.596 kg)    Medications: . acetaminophen  650 mg Oral Once  . aspirin EC  81 mg Oral Daily  . atorvastatin  80 mg Oral q1800  . clopidogrel  75 mg Oral Q breakfast  . metoprolol tartrate  25 mg Oral BID  . sodium chloride  3 mL Intravenous Q12H  . sodium chloride  3 mL Intravenous Q12H       Physical Exam:   General appearance: alert, cooperative and no distress Neck: no adenopathy, no carotid bruit, no JVD, supple, symmetrical, trachea midline and thyroid not enlarged, symmetric, no tenderness/mass/nodules Lungs: clear to auscultation bilaterally Heart: regular rate and rhythm Abdomen: soft, non-tender; bowel sounds normal; no masses,  no organomegaly Extremities: no edema, redness or tenderness in the calves or thighs Pulses: 2+ and symmetric; R radial site stable Skin: Skin color, texture, turgor normal. No rashes or lesions Neurologic: Grossly normal   Rate: 64  Rhythm: normal sinus rhythm  ECG (independently read by me): NSR at 71  Lab Results:  BMP Latest Ref Rng 03/22/2015 03/21/2015 03/20/2015  Glucose 70 - 99 mg/dL 84 86 696(E109(H)  BUN 6 - 23 mg/dL 5(L) 8 9  Creatinine 9.520.50 - 1.10 mg/dL 8.410.70 3.240.72 4.010.67  Sodium 135 - 145  mmol/L 138 138 134(L)  Potassium 3.5 - 5.1 mmol/L 4.1 3.9 4.5  Chloride 96 - 112 mmol/L 109 108 107  CO2 19 - 32 mmol/L 25 24 17(L)  Calcium 8.4 - 10.5 mg/dL 8.4 8.4 8.8     CBC Latest Ref Rng 03/22/2015 03/21/2015 03/20/2015  WBC 4.0 - 10.5 K/uL 6.2 6.6 9.2  Hemoglobin 12.0 - 15.0 g/dL 11.5(L) 13.0 14.0  Hematocrit 36.0 - 46.0 % 34.0(L) 37.8 39.9  Platelets 150 - 400 K/uL 183 228 253      Recent Labs  03/21/15 1442 03/22/15 0138  TROPONINI 1.86* 1.22*    Hepatic Function Panel  Recent Labs  03/20/15 1501  PROT 7.6  ALBUMIN 4.5  AST 32  ALT 35  ALKPHOS 114  BILITOT 0.5    Recent Labs  03/21/15 0755  INR 1.00   BNP (last 3 results) No results for input(s): BNP in the last 8760 hours.  ProBNP (last 3 results) No results for input(s): PROBNP in the last 8760 hours.   Lipid Panel     Component Value Date/Time   CHOL 154 03/22/2015 0145   TRIG 119 03/22/2015 0145   HDL 38* 03/22/2015 0145   CHOLHDL 4.1 03/22/2015 0145   VLDL 24  03/22/2015 0145   LDLCALC 92 03/22/2015 0145      Imaging:  Dg Chest 2 View  03/20/2015   CLINICAL DATA:  Acute chest pain.  EXAM: CHEST  2 VIEW  COMPARISON:  January 22, 2015.  FINDINGS: The heart size and mediastinal contours are within normal limits. Both lungs are clear. No pneumothorax or pleural effusion is noted. The visualized skeletal structures are unremarkable.  IMPRESSION: No active cardiopulmonary disease.   Electronically Signed   By: Lupita Raider, M.D.   On: 03/20/2015 15:53    Cath/PCI PROCEDURAL FINDINGS Hemodynamics: AO 96/60 with a mean of 74 LV 96/20  Coronary angiography: Coronary dominance: right  Left mainstem: Patent with 20% ostial stenosis. The vessel divides into the LAD and left circumflex.  Left anterior descending (LAD): The LAD is patent and it wraps around the LV apex. The vessel has mild diffuse disease. There is segmental plaquing in the proximal and mid LAD with 20-30% stenosis.  After administration of intracoronary nitroglycerin, LAD is a much larger vessel. There is 30-40% stenosis present in the mid LAD at the origin of a large septal perforator branch. There is also 30-40% stenosis in the proximal LAD at the origin of the first diagonal. The first diagonal branch has 40-50% ostial stenosis and 90% proximal vessel stenosis.  Left circumflex (LCx): The left circumflex is diffusely diseased. The vessel has a long segment 50% proximal left circumflex stenosis. The bifurcation of the mid left circumflex and first obtuse marginal branch also has 50% stenosis. The OM branches are patent.  Right coronary artery (RCA): The right coronary artery is dominant. The mid RCA has 40-50% stenosis. The remainder of the vessel is smooth. The PDA and PLA branches are patent.  Left ventriculography: Left ventricular systolic function is vigorous, LVEF is estimated at 65-70%, there is no significant mitral regurgitation   PCI Note: Following the diagnostic procedure, the decision was made to proceed with PCI of the diagonal. There was severe focal 90% stenosis worse after IC NTG. Otherwise there was diffuse plaquing but no tight stenosis. The patient was loaded with Plavix 600 mg. Weight-based bivalirudin was given for anticoagulation. Once a therapeutic ACT was achieved, a 5 [place] EBU 3.0 guide catheter was inserted. A cougar coronary guidewire was used to cross the lesion. The lesion was then primarily stented with a 2.75 x 12 mm Multilink Vision BMS. The stent was postdilated with a 3.0 mm noncompliant balloon. Following PCI, there was 0% residual stenosis and TIMI-3 flow. Final angiography confirmed an excellent result. The patient tolerated the procedure well. There were no immediate procedural complications. A TR band was used for radial hemostasis. The patient was transferred to the post catheterization recovery area for further monitoring.  PCI Data: Vessel - Diagonal 1 Percent  Stenosis (pre) 90 TIMI-flow 3 Stent 2.75x12 mm Vision BMS Percent Stenosis (post) 0 TIMI-flow (post) 3  Contrast: 160  Estimated Blood Loss: minimal  Final Conclusions:  1. Severe single-vessel coronary artery disease with successful PCI using a bare-metal stent platform 2. Moderate diffuse coronary artery disease as outlined above 3. Vigorous LV systolic function   Recommendations:  Dual antiplatelet therapy with aspirin and Plavix for a minimum of 30 days, but favor one year if tolerated since this patient presented with non-ST elevation MI. She will need aggressive risk reduction. Substance abuse needs to be addressed.    Assessment/Plan:   Active Problems:   ACS (acute coronary syndrome)  1. NSTEMI  2. H/o cocaine  use 3. Bipolar disorder  With recurrent chest pain last night and concomitant CAD will add imdur 30 mg; will start cardizem 120 mg daily and dc  metoprolol  With potential for spasm in etiology.  Aggressive statin; consider outpatient initiation of low dose ACE-I to improve endothelial function. Will keep today.    Lennette Bihari, MD, [facility] 03/22/2015, 9:45 AM

## 2015-03-23 ENCOUNTER — Encounter (HOSPITAL_COMMUNITY): Payer: Self-pay | Admitting: Nurse Practitioner

## 2015-03-23 ENCOUNTER — Telehealth: Payer: Self-pay | Admitting: Cardiovascular Disease

## 2015-03-23 DIAGNOSIS — Z9861 Coronary angioplasty status: Secondary | ICD-10-CM

## 2015-03-23 DIAGNOSIS — F1410 Cocaine abuse, uncomplicated: Secondary | ICD-10-CM

## 2015-03-23 DIAGNOSIS — I2510 Atherosclerotic heart disease of native coronary artery without angina pectoris: Secondary | ICD-10-CM

## 2015-03-23 DIAGNOSIS — I214 Non-ST elevation (NSTEMI) myocardial infarction: Secondary | ICD-10-CM

## 2015-03-23 LAB — CBC
HEMATOCRIT: 33.2 % — AB (ref 36.0–46.0)
Hemoglobin: 11.3 g/dL — ABNORMAL LOW (ref 12.0–15.0)
MCH: 31 pg (ref 26.0–34.0)
MCHC: 34 g/dL (ref 30.0–36.0)
MCV: 91 fL (ref 78.0–100.0)
Platelets: 188 10*3/uL (ref 150–400)
RBC: 3.65 MIL/uL — ABNORMAL LOW (ref 3.87–5.11)
RDW: 12.2 % (ref 11.5–15.5)
WBC: 6.6 10*3/uL (ref 4.0–10.5)

## 2015-03-23 MED ORDER — NICOTINE 14 MG/24HR TD PT24: 14.0000 mg | MEDICATED_PATCH | Freq: Every day | TRANSDERMAL | Status: DC

## 2015-03-23 MED ORDER — NITROGLYCERIN 0.4 MG SL SUBL: 0.4000 mg | SUBLINGUAL_TABLET | SUBLINGUAL | Status: DC | PRN

## 2015-03-23 MED ORDER — CLOPIDOGREL BISULFATE 75 MG PO TABS: 75.0000 mg | ORAL_TABLET | Freq: Every day | ORAL | Status: DC

## 2015-03-23 MED ORDER — ISOSORBIDE MONONITRATE ER 30 MG PO TB24: 30.0000 mg | ORAL_TABLET | Freq: Every day | ORAL | Status: DC

## 2015-03-23 MED ORDER — ALPRAZOLAM 0.25 MG PO TABS: 0.2500 mg | ORAL_TABLET | Freq: Two times a day (BID) | ORAL | Status: DC | PRN

## 2015-03-23 MED ORDER — PRAVASTATIN SODIUM 40 MG PO TABS: 40.0000 mg | ORAL_TABLET | Freq: Every day | ORAL | Status: DC

## 2015-03-23 MED ORDER — DILTIAZEM HCL ER COATED BEADS 120 MG PO TB24: 120.0000 mg | ORAL_TABLET | Freq: Every day | ORAL | Status: DC

## 2015-03-23 MED ORDER — ASPIRIN 81 MG PO TBEC: 81.0000 mg | DELAYED_RELEASE_TABLET | Freq: Every day | ORAL | Status: AC

## 2015-03-23 NOTE — Progress Notes (Signed)
0805 Cardiac Rehab Pt denies any questions related to education information provided yesterday. She states that she has been walking in the hall and outside. Pt denies any chest pain. Beatrix FettersHughes, Tyliah Schlereth G, RN 03/23/2015 8:15 AM

## 2015-03-23 NOTE — Telephone Encounter (Signed)
7 day TOC fu -appt 03-27-15 with Thayer Ohmhris

## 2015-03-23 NOTE — Progress Notes (Signed)
Patient Name: Christina Morrison Date of Encounter: 03/23/2015     Principal Problem:   NSTEMI (non-ST elevated myocardial infarction) Active Problems:   ACS (acute coronary syndrome)   CAD (coronary artery disease)   Tobacco use disorder   Cocaine abuse   Chronic lower back pain   Anxiety and depression    SUBJECTIVE  No c/p overnight.  Has been leaving the floor.  Eager to go home.  CURRENT MEDS . acetaminophen  650 mg Oral Once  . aspirin EC  81 mg Oral Daily  . atorvastatin  80 mg Oral q1800  . clopidogrel  75 mg Oral Q breakfast  . diltiazem  120 mg Oral Daily  . isosorbide mononitrate  30 mg Oral Daily  . sodium chloride  3 mL Intravenous Q12H  . sodium chloride  3 mL Intravenous Q12H    OBJECTIVE  Filed Vitals:   03/22/15 1830 03/22/15 1930 03/22/15 2315 03/23/15 0407  BP: 122/88 114/67 105/81 82/33  Pulse:  79 72 69  Temp:   98.1 F (36.7 C) 98.1 F (36.7 C)  TempSrc:  Oral Oral Oral  Resp: 17     Height:      Weight:   151 lb 14.4 oz (68.901 kg)   SpO2:  100% 100% 98%    Intake/Output Summary (Last 24 hours) at 03/23/15 0754 Last data filed at 03/22/15 2230  Gross per 24 hour  Intake    980 ml  Output      0 ml  Net    980 ml   Filed Weights   03/20/15 1452 03/22/15 0045 03/22/15 2315  Weight: 140 lb (63.504 kg) 141 lb 1.5 oz (64 kg) 151 lb 14.4 oz (68.901 kg)    PHYSICAL EXAM  General: Pleasant, NAD. Neuro: Alert and oriented X 3. Moves all extremities spontaneously. Psych: Flat affect. HEENT:  Normal  Neck: Supple without bruits or JVD. Lungs:  Resp regular and unlabored, CTA. Heart: RRR no s3, s4, or murmurs. Abdomen: Soft, non-tender, non-distended, BS + x 4.  Extremities: No clubbing, cyanosis or edema. DP/PT/Radials 2+ and equal bilaterally.  R wrist cath site w/o bleeding/bruit/hematoma.  Accessory Clinical Findings  CBC  Recent Labs  03/20/15 1501  03/22/15 0145 03/23/15 0311  WBC 9.2  < > 6.2 6.6  NEUTROABS 7.2  --   --    --   HGB 14.0  < > 11.5* 11.3*  HCT 39.9  < > 34.0* 33.2*  MCV 88.3  < > 90.7 91.0  PLT 253  < > 183 188  < > = values in this interval not displayed. Basic Metabolic Panel  Recent Labs  03/21/15 0630 03/22/15 0145  NA 138 138  K 3.9 4.1  CL 108 109  CO2 24 25  GLUCOSE 86 84  BUN 8 5*  CREATININE 0.72 0.70  CALCIUM 8.4 8.4   Liver Function Tests  Recent Labs  03/20/15 1501  AST 32  ALT 35  ALKPHOS 114  BILITOT 0.5  PROT 7.6  ALBUMIN 4.5    Recent Labs  03/20/15 1501  LIPASE 25   Cardiac Enzymes  Recent Labs  03/22/15 0138 03/22/15 0807 03/22/15 1259  TROPONINI 1.22* 1.34* 1.44*   D-Dimer  Recent Labs  03/20/15 1501  DDIMER <0.27   Hemoglobin A1C  Recent Labs  03/21/15 0630  HGBA1C 5.2   Fasting Lipid Panel  Recent Labs  03/22/15 0145  CHOL 154  HDL 38*  LDLCALC 92  TRIG 409  CHOLHDL 4.1   Thyroid Function Tests  Recent Labs  03/21/15 0630  TSH 2.944    TELE  rsr  ECG  Rsr, 71, no acute st/t changes.  Radiology/Studies  Dg Chest 2 View  03/20/2015   CLINICAL DATA:  Acute chest pain.  EXAM: CHEST  2 VIEW  COMPARISON:  January 22, 2015.  FINDINGS: The heart size and mediastinal contours are within normal limits. Both lungs are clear. No pneumothorax or pleural effusion is noted. The visualized skeletal structures are unremarkable.  IMPRESSION: No active cardiopulmonary disease.   Electronically Signed   By: Lupita RaiderJames  Green Jr, M.D.   On: 03/20/2015 15:53    ASSESSMENT AND PLAN  1.  NSTEMI/ACS/CAD:  S/p cath and PCI/BMS to the D1.  Residual, moderate, 3VD.  Cont asa, plavix (preferably up to 1 yr), statin.  No bb in setting of cocaine usage.  CCB and nitrate added yesterday 2/2 concern for possible vasospasm in setting of cocaine.  2.  HL:  LDL 92.  LFT's wnl.  Cont statin.  She doesn't think she'll be able to afford atorvastatin.  Will switch to pravachol 40 on discharge.  3.  Polysubstance abuse (tob/cocaine):  Complete  cessation advised.  Currently smoking 1 ppd.  Says she only occasionally uses cocaine.  4.  Bipolar d/o/Anxiety:  Prev on xanax @ home for anxiety.  She has been receiving it here.  She does not have a PCP but has an orange card.  I will provide a Rx for 1 week but she will need to see primary care beyond that.  5.  Chronic LBP:  She was not on pain meds @ home but is now requesting a Rx @ d/c.  I advised that she will have to establish primary care f/u for this.  I will not provide narcotics to a narcotic abuser.  Signed, Nicolasa Duckinghristopher Berge NP   I have seen and examined the patient along with Nicolasa Duckinghristopher Berge NP .  I have reviewed the chart, notes and new data.  I agree with NP's note.  Key new complaints: a variety of musculoskeletal complaints, asking for pain meds Key examination changes: no overt CHF, arrhythmia or access site problems   PLAN: Ultimately, her prognosis rests heavily on compliance with meds, which in turn probably depends on her access to psychiatric care and social support. Enlist care manager help. Reviewed critical need for compliance with DAPT for minimum of 30 days, preferably 6-12 months.  Thurmon FairMihai Sydny Schnitzler, MD, [facility] [phone] 03/23/2015, 8:22 AM

## 2015-03-23 NOTE — Discharge Instructions (Signed)

## 2015-03-23 NOTE — Discharge Summary (Signed)
Discharge Summary   Patient ID: Christina Morrison,  MRN: [id number], DOB/AGE: [birth year] 39 y.o.  Admit date: 03/20/2015 Discharge date: 03/23/2015  Primary Care Provider: Doris CheadleADVANI, DEEPAK Primary Cardiologist: Judie PetitM. Excell Seltzerooper, MD   Discharge Diagnoses Principal Problem:   NSTEMI (non-ST elevated myocardial infarction) Active Problems:   ACS (acute coronary syndrome)   CAD (coronary artery disease)   Tobacco use disorder   Cocaine abuse   Chronic lower back pain   Anxiety and depression  Allergies No Known Allergies  Procedures  Cardiac Catheterization and Percutaneous Coronary Intervention 4.6.2016  Hemodynamics: AO 96/60 with a mean of 74 LV 96/20              Coronary angiography: Coronary dominance: right  Left mainstem: Patent with 20% ostial stenosis. The vessel divides into the LAD and left circumflex. Left anterior descending (LAD): The LAD is patent and it wraps around the LV apex. The vessel has mild diffuse disease. There is segmental plaquing in the proximal and mid LAD with 20-30% stenosis. After administration of intracoronary nitroglycerin, LAD is a much larger vessel. There is 30-40% stenosis present in the mid LAD at the origin of a large septal perforator branch. There is also 30-40% stenosis in the proximal LAD at the origin of the first diagonal. The first diagonal branch has 40-50% ostial stenosis and 90% proximal vessel stenosis.   **The first Diagonal was successfully stented using a 2.75 x 12 mm Vision bare metal stent.**  Left circumflex (LCx): The left circumflex is diffusely diseased. The vessel has a long segment 50% proximal left circumflex stenosis. The bifurcation of the mid left circumflex and first obtuse marginal branch also has 50% stenosis. The OM branches are patent. Right coronary artery (RCA): The right coronary artery is dominant. The mid RCA has 40-50% stenosis. The remainder of the vessel is smooth. The PDA and PLA branches are patent.  Left  ventriculography: Left ventricular systolic function is vigorous, LVEF is estimated at 65-70%, there is no significant mitral regurgitation  _____________   History of Present Illness  39 y/o female with a h/o polysubstance abuse and bipolar disorder.  She also has a family history of CAD.  She was in her usual state of health until the morning of 03/20/2015, when she developed severe substernal chest pain.  She called EMS and [facility] where she was treated with ASA, NTG, and morphine.  Troponin elevated to 0.62 while in the ED and she was placed on heparin and admitted to cardiology for further evaluation.  Hospital Course  Pt ruled in for NSTEMI, eventually peaking her troponin at 3.24.  Urine drug screen was positive for cocaine.  Decision was made to pursue diagnostic catheterization on 03/21/2015, and this revealed moderate multivessel CAD with severe disease in the proximal portion of the first Diagonal branch of the LAD.  This was felt to be the culprit vessel and was subsequently stented using a 2.75 x 12 mm Vision bare metal stent.  Patient tolerated procedure well but post-procedure continued to have intermittent chest pain.  ECG was non-acute.  She was placed on calcium channel blocker and nitrate therapy in the setting of recent cocaine usage and the possibility of coronary vasospasm.  Beta blocker therapy was discontinued for the same reason.  Since addition of CCB/ntg, she has had no further chest pain and has been ambulating without difficulty.  She has been counseled on the importance of complete tobacco and cocaine cessation.  She will be  discharged home today in good condition with follow-up arranged for early next week.  Discharge Vitals Blood pressure 136/66, pulse 80, temperature 98.1 F (36.7 C), temperature source Oral, resp. rate 18, height  (1.549 m), weight 151 lb 14.4 oz (68.901 kg), last menstrual period 02/27/2015, SpO2 98 %.  Filed Weights   03/20/15 1452  03/22/15 0045 03/22/15 2315  Weight: 140 lb (63.504 kg) 141 lb 1.5 oz (64 kg) 151 lb 14.4 oz (68.901 kg)    Labs  CBC  Recent Labs  03/20/15 1501  03/22/15 0145 03/23/15 0311  WBC 9.2  < > 6.2 6.6  NEUTROABS 7.2  --   --   --   HGB 14.0  < > 11.5* 11.3*  HCT 39.9  < > 34.0* 33.2*  MCV 88.3  < > 90.7 91.0  PLT 253  < > 183 188  < > = values in this interval not displayed. Basic Metabolic Panel  Recent Labs  03/21/15 0630 03/22/15 0145  NA 138 138  K 3.9 4.1  CL 108 109  CO2 24 25  GLUCOSE 86 84  BUN 8 5*  CREATININE 0.72 0.70  CALCIUM 8.4 8.4   Liver Function Tests  Recent Labs  03/20/15 1501  AST 32  ALT 35  ALKPHOS 114  BILITOT 0.5  PROT 7.6  ALBUMIN 4.5    Recent Labs  03/20/15 1501  LIPASE 25   Cardiac Enzymes  Recent Labs  03/22/15 0138 03/22/15 0807 03/22/15 1259  TROPONINI 1.22* 1.34* 1.44*   BNP Invalid input(s): POCBNP D-Dimer  Recent Labs  03/20/15 1501  DDIMER <0.27   Hemoglobin A1C  Recent Labs  03/21/15 0630  HGBA1C 5.2   Fasting Lipid Panel  Recent Labs  03/22/15 0145  CHOL 154  HDL 38*  LDLCALC 92  TRIG 161  CHOLHDL 4.1   Thyroid Function Tests  Recent Labs  03/21/15 0630  TSH 2.944    Disposition  Pt is being discharged home today in good condition.  Follow-up Plans & Appointments      Follow-up Information    Follow up with Nicolasa Ducking, NP On 03/27/2015.   Specialty:  Nurse Practitioner   Why:  10:30 AM   Contact information:   [address] [place] [address] [phone]       Follow up with Primary Care.   Why:  obtain and follow-up within 2-3 wks.      Discharge Medications    Medication List    STOP taking these medications        ibuprofen 600 MG tablet  Commonly known as:  ADVIL,MOTRIN     loratadine 10 MG tablet  Commonly known as:  CLARITIN      TAKE these medications        ALPRAZolam 0.25 MG tablet  Commonly known as:  XANAX    Take 1 tablet (0.25 mg total) by mouth 2 (two) times daily as needed for anxiety.     aspirin 81 MG EC tablet  Take 1 tablet (81 mg total) by mouth daily.     clopidogrel 75 MG tablet  Commonly known as:  PLAVIX  Take 1 tablet (75 mg total) by mouth daily with breakfast.     diltiazem 120 MG 24 hr tablet  Commonly known as:  CARDIZEM LA  Take 1 tablet (120 mg total) by mouth daily.     isosorbide mononitrate 30 MG 24 hr tablet  Commonly known as:  IMDUR  Take 1 tablet (30 mg total) by mouth daily.     nicotine 14 mg/24hr patch  Commonly known as:  NICODERM CQ  Place 1 patch (14 mg total) onto the skin daily.     nitroGLYCERIN 0.4 MG SL tablet  Commonly known as:  NITROSTAT  Place 1 tablet (0.4 mg total) under the tongue every 5 (five) minutes as needed for chest pain.     pravastatin 40 MG tablet  Commonly known as:  PRAVACHOL  Take 1 tablet (40 mg total) by mouth daily.       Outstanding Labs/Studies  Follow-up lipids/lft's in 6-8 wks.  Duration of Discharge Encounter   Greater than 30 minutes including physician time.  Signed, Nicolasa Ducking NP 03/23/2015, 8:48 AM

## 2015-03-26 NOTE — Telephone Encounter (Signed)
lmtcb

## 2015-03-27 ENCOUNTER — Encounter: Payer: Self-pay | Admitting: Nurse Practitioner

## 2015-03-27 ENCOUNTER — Ambulatory Visit (INDEPENDENT_AMBULATORY_CARE_PROVIDER_SITE_OTHER): Payer: No Typology Code available for payment source | Admitting: Nurse Practitioner

## 2015-03-27 ENCOUNTER — Ambulatory Visit: Payer: No Typology Code available for payment source | Attending: Internal Medicine

## 2015-03-27 VITALS — BP 100/64 | HR 98 | Ht 61.0 in | Wt 148.6 lb

## 2015-03-27 DIAGNOSIS — I214 Non-ST elevation (NSTEMI) myocardial infarction: Secondary | ICD-10-CM

## 2015-03-27 DIAGNOSIS — I249 Acute ischemic heart disease, unspecified: Secondary | ICD-10-CM

## 2015-03-27 DIAGNOSIS — I222 Subsequent non-ST elevation (NSTEMI) myocardial infarction: Secondary | ICD-10-CM

## 2015-03-27 DIAGNOSIS — F1910 Other psychoactive substance abuse, uncomplicated: Secondary | ICD-10-CM

## 2015-03-27 DIAGNOSIS — E785 Hyperlipidemia, unspecified: Secondary | ICD-10-CM

## 2015-03-27 DIAGNOSIS — I2510 Atherosclerotic heart disease of native coronary artery without angina pectoris: Secondary | ICD-10-CM

## 2015-03-27 MED ORDER — ALPRAZOLAM 0.25 MG PO TABS: 0.2500 mg | ORAL_TABLET | Freq: Three times a day (TID) | ORAL | Status: DC | PRN

## 2015-03-27 MED ORDER — BUPROPION HCL ER (SR) 150 MG PO TB12: 150.0000 mg | ORAL_TABLET | Freq: Two times a day (BID) | ORAL | Status: DC

## 2015-03-27 NOTE — Telephone Encounter (Signed)
Pt was seen today.

## 2015-03-27 NOTE — Progress Notes (Signed)
Patient Name: Christina CascoKelly Fung Date of Encounter: 03/27/2015  Primary Care Provider:  Doris CheadleADVANI, DEEPAK, MD Primary Cardiologist:  Judie PetitM. Excell Seltzerooper, MD   Chief Complaint  39 y/o female s/p recent NSTEMI who presents for f/u.  Past Medical History   Past Medical History  Diagnosis Date  . Depression   . Anxiety   . Bipolar 1 disorder   . Scoliosis   . Migraine   . Osteoarthritis   . Coronary artery disease     a. 03/2015 NSTEMI/PCI: LM 20ost, LAD 30-40p/m, D1 40-50ost, 90p (2.75x12 Vision BMS), LCX 50p/m, RCA 40-3741m, EF 65-70% (UDS +for cocaine).  [name]. TIA (transient ischemic attack)   . Polysubstance abuse     a. tobacco/cocaine   Past Surgical History  Procedure Laterality Date  . Coronary stent placement  03/21/2015    first diagonal  . Left heart catheterization with coronary angiogram N/A 03/21/2015    Procedure: LEFT HEART CATHETERIZATION WITH CORONARY ANGIOGRAM;  Surgeon: Tonny BollmanMichael Cooper, MD;  [facility];  Service: Cardiovascular;  Laterality: N/A;  . Cardiac catheterization  03/21/2015    Procedure: CORONARY STENT INTERVENTION;  Surgeon: Tonny BollmanMichael Cooper, MD;  [facility];  Service: Cardiovascular;;  diag bms 2.75 x 12 vision    Allergies  No Known Allergies  HPI  39 year old female with the above complex problem list. She is status post recent non-ST segment elevation myocardial infarction in the setting of cocaine usage. She was [facility] to [facility] and underwent diagnostic cardiac catheterization revealing severe proximal first diagonal disease. This was successfully stented using a vision bare-metal stent. She tolerated the procedure well and was discharged home in good condition. Since discharge, she has been feeling well without chest pain or dyspnea. She has had some fatigue but has slowly been increasing her activity. She has had a fair amount of anxiety in the setting of prior history of anxiety as well as bipolar disorder. She is in the process of obtaining  a primary care provider at [facility] but will follow-up for at least another week. She has not used cocaine since discharge. She does continue to smoke 2 cigarettes per day and says she is trying to quit. She denies PND, orthopnea, dizziness, syncope, edema, or early satiety.  Home Medications  Prior to Admission medications   Medication Sig Start Date End Date Taking? Authorizing Provider  ALPRAZolam (XANAX) 0.25 MG tablet Take 1 tablet (0.25 mg total) by mouth 2 (two) times daily as needed for anxiety. 03/23/15  Yes Ok Anishristopher R Domanik [place], NP  aspirin EC 81 MG EC tablet Take 1 tablet (81 mg total) by mouth daily. 03/23/15  Yes Ok Anishristopher R Azyriah Nevins, NP  clopidogrel (PLAVIX) 75 MG tablet Take 1 tablet (75 mg total) by mouth daily with breakfast. 03/23/15  Yes Ok Anishristopher R Usama Harkless, NP  diltiazem (CARDIZEM LA) 120 MG 24 hr tablet Take 1 tablet (120 mg total) by mouth daily. 03/23/15  Yes Ok Anishristopher R Bani Gianfrancesco, NP  isosorbide mononitrate (IMDUR) 30 MG 24 hr tablet Take 1 tablet (30 mg total) by mouth daily. 03/23/15  Yes Ok Anishristopher R Ronold [place]grove, NP  nicotine (NICODERM CQ) 14 mg/24hr patch Place 1 patch (14 mg total) onto the skin daily. 03/23/15  Yes Ok Anishristopher R Tomie Elko, NP  nitroGLYCERIN (NITROSTAT) 0.4 MG SL tablet Place 1 tablet (0.4 mg total) under the tongue every 5 (five) minutes as needed for chest pain. 03/23/15  Yes Ok Anishristopher R Verlan Grotz, NP  pravastatin (PRAVACHOL) 40 MG tablet Take 1 tablet (40  mg total) by mouth daily. 03/23/15  Yes Ok Anis, NP    Review of Systems  Overall doing well. Denies chest pain or dyspnea. She has had some fatigue.  All other systems reviewed and are otherwise negative except as noted above.  Physical Exam  VS:  BP 100/64 mmHg  Pulse 98  Ht  (1.549 m)  Wt 148 lb 9.6 oz (67.405 kg)  BMI 28.09 kg/m2  SpO2 99%  LMP 02/27/2015 , BMI Body mass index is 28.09 kg/(m^2). GEN: Well nourished, well developed, in no acute distress. HEENT: normal. Neck: Supple,  no JVD, carotid bruits, or masses. Cardiac: RRR, no murmurs, rubs, or gallops. No clubbing, cyanosis, edema.  Radials/DP/PT 2+ and equal bilaterally. Right wrist without bleeding, bruit, or hematoma. Respiratory:  Respirations regular and unlabored, clear to auscultation bilaterally. GI: Soft, nontender, nondistended, BS + x 4. MS: no deformity or atrophy. Skin: warm and dry, no rash. Neuro:  Strength and sensation are intact. Psych: Normal affect.  Accessory Clinical Findings  ECG - regular sinus rhythm, 90, no acute ST or T changes.  Assessment & Plan  1.  Non-ST segment elevation myocardial infarction, subsequent episode of care/CAD: Patient is status post recent admission for non-STEMI with catheterization revealing severe diagonal disease. This was successfully stented. LV function was normal. Urine drug screen was positive for cocaine. She remains on aspirin, Plavix, statin, nitrate, and calcium channel blocker therapy. She is not on a beta blocker secondary to prior cocaine usage. She does not think she will be partaking in cardiac rehabilitation.  2. Hyperlipidemia: LDL 92 on admission. She currently is on Pravachol 40 mg. She is not on a higher potency statin secondary to cost. She will need follow-up lipids and LFTs in approximately 8 weeks.  3. Polysubstance abuse: She continues to smoke 2 cigarettes per day. She has not used cocaine since discharge. Complete cessation advised. She cannot afford nicotine patches and is already on Wellbutrin. I recommended that she choose a day on her calendar and identify it as her quit date.  4. Depression/anxiety/bipolar disorder: She reports a fair amount of anxiety since her hospitalization. She is on Wellbutrin but only just resumed it last week. We did provide her with a prescription for Xanax for 7 days at discharge and have recommended primary care follow-up. She is working on obtaining a primary care provider. I have provided her with a  prescription for Xanax 0.25 mg 1 by mouth 3 times a day when necessary #21 with 0 refills. Any additional refills should be obtained from her primary care provider in the future.  5. Disposition: Follow-up with Dr. Excell Seltzer in 3 months or sooner if necessary.  Nicolasa Ducking, NP 03/27/2015, 11:03 AM

## 2015-03-27 NOTE — Patient Instructions (Signed)
Medication Instructions:  Given a prescription today for Xanax till patient sees Primary Care Doctor  Labwork: None  Testing/Procedures: None  Follow-Up: 3 months with Dr. Excell Seltzerooper  Any Other Special Instructions Will Be Listed Below (If Applicable).  Please try and quit smoking.

## 2015-03-28 ENCOUNTER — Ambulatory Visit: Payer: Self-pay | Admitting: Obstetrics & Gynecology

## 2015-03-28 ENCOUNTER — Other Ambulatory Visit (HOSPITAL_COMMUNITY)
Admission: RE | Admit: 2015-03-28 | Discharge: 2015-03-28 | Disposition: A | Discharge status: Home / Self Care | Payer: MEDICAID | Source: Ambulatory Visit | Attending: Obstetrics & Gynecology | Admitting: Obstetrics & Gynecology

## 2015-03-28 ENCOUNTER — Encounter: Payer: Self-pay | Admitting: Obstetrics & Gynecology

## 2015-03-28 ENCOUNTER — Ambulatory Visit (INDEPENDENT_AMBULATORY_CARE_PROVIDER_SITE_OTHER): Payer: Medicaid Other | Admitting: Obstetrics & Gynecology

## 2015-03-28 VITALS — BP 127/77 | HR 94 | Temp 97.4°F | Ht 61.0 in | Wt 149.5 lb

## 2015-03-28 DIAGNOSIS — Z975 Presence of (intrauterine) contraceptive device: Secondary | ICD-10-CM

## 2015-03-28 DIAGNOSIS — Z124 Encounter for screening for malignant neoplasm of cervix: Secondary | ICD-10-CM

## 2015-03-28 DIAGNOSIS — Z30433 Encounter for removal and reinsertion of intrauterine contraceptive device: Secondary | ICD-10-CM

## 2015-03-28 DIAGNOSIS — Z01419 Encounter for gynecological examination (general) (routine) without abnormal findings: Secondary | ICD-10-CM | POA: Diagnosis not present

## 2015-03-28 DIAGNOSIS — Z1151 Encounter for screening for human papillomavirus (HPV): Secondary | ICD-10-CM | POA: Diagnosis not present

## 2015-03-28 DIAGNOSIS — Z01411 Encounter for gynecological examination (general) (routine) with abnormal findings: Secondary | ICD-10-CM | POA: Insufficient documentation

## 2015-03-28 MED ORDER — PARAGARD INTRAUTERINE COPPER IU IUD
1.0000 | INTRAUTERINE_SYSTEM | Freq: Once | INTRAUTERINE | Status: AC
  Administered 2015-03-28: 1 via INTRAUTERINE

## 2015-03-28 NOTE — Progress Notes (Signed)
GYNECOLOGY CLINIC ANNUAL PREVENTATIVE CARE ENCOUNTER NOTE  Subjective:     Christina Morrison is a 39 y.o. [id number] female here for a routine annual gynecologic exam.  Current complaints: recent MI in the setting of cocaine use for which she was hospitalized and underwent cardiac catheterization and stenting on 03/21/15.  No current cardiac symptoms.  Also reports having an Paragard IUD in place for over 17 years; wants it removed and replaced today.  No other GYN concerns. Accompanied by her sister.   Gynecologic History Patient's last menstrual period was 03/26/2015. Contraception: IUD in place for 17 years Last Pap: 17 years ago. Results were: normal   Obstetric History OB History  Gravida Para Term Preterm AB SAB TAB Ectopic Multiple Living  0 0 0 0 0 0 2    # Outcome Date GA Lbr Len/2nd Weight Sex Delivery Anes PTL Lv  2 Term           1 Term               Past Medical History  Diagnosis Date  . Depression   . Anxiety   . Bipolar 1 disorder   . Scoliosis   . Migraine   . Osteoarthritis   . Coronary artery disease     a. 03/2015 NSTEMI/PCI: LM 20ost, LAD 30-40p/m, D1 40-50ost, 90p (2.75x12 Vision BMS), LCX 50p/m, RCA 40-42m, EF 65-70% (UDS +for cocaine).  [name] TIA (transient ischemic attack)   . Polysubstance abuse     a. tobacco/cocaine    Past Surgical History  Procedure Laterality Date  . Coronary stent placement  03/21/2015    first diagonal  . Left heart catheterization with coronary angiogram N/A 03/21/2015    Procedure: LEFT HEART CATHETERIZATION WITH CORONARY ANGIOGRAM;  Surgeon: [name], MD;  Location: [facility] CATH LAB;  Service: Cardiovascular;  Laterality: N/A;  . Cardiac catheterization  03/21/2015    Procedure: CORONARY STENT INTERVENTION;  Surgeon: [name], MD;  Location[facility] CATH LAB;  Service: Cardiovascular;;  diag bms 2.75 x 12 vision    Outpatient Encounter Prescriptions as of 03/28/2015  Medication Sig  . ALPRAZolam (XANAX) 0.25 MG tablet  Take 1 tablet (0.25 mg total) by mouth 3 (three) times daily as needed for anxiety.  [name] aspirin EC 81 MG EC tablet Take 1 tablet (81 mg total) by mouth daily.  [name] buPROPion (WELLBUTRIN SR) 150 MG 12 hr tablet Take 1 tablet (150 mg total) by mouth 2 (two) times daily.  . clopidogrel (PLAVIX) 75 MG tablet Take 1 tablet (75 mg total) by mouth daily with breakfast.  . diltiazem (CARDIZEM LA) 120 MG 24 hr tablet Take 1 tablet (120 mg total) by mouth daily.  . isosorbide mononitrate (IMDUR) 30 MG 24 hr tablet Take 1 tablet (30 mg total) by mouth daily.  . nitroGLYCERIN (NITROSTAT) 0.4 MG SL tablet Place 1 tablet (0.4 mg total) under the tongue every 5 (five) minutes as needed for chest pain.  . pravastatin (PRAVACHOL) 40 MG tablet Take 1 tablet (40 mg total) by mouth daily.  . nicotine (NICODERM CQ) 14 mg/24hr patch Place 1 patch (14 mg total) onto the skin daily. (Patient not taking: Reported on 03/28/2015)  . [DISCONTINUED] ALPRAZolam (XANAX) 0.25 MG tablet Take 1 tablet (0.25 mg total) by mouth 2 (two) times daily as needed for anxiety.  . [EXPIRED] PARAGARD INTRAUTERINE COPPER IUD 1 Device     No Known Allergies  History   Social History  .  Marital Status: Divorced    Spouse Name: N/A  . Number of Children: N/A  . Years of Education: N/A   Occupational History  . Not on file.   Social History Main Topics  . Smoking status: Current Every Day Smoker -- 1.00 packs/day    Types: Cigarettes  . Smokeless tobacco: Never Used  . Alcohol Use: No     Comment: once a month  . Drug Use: Yes    Special: Cocaine  . Sexual Activity: Not on file   Other Topics Concern  . Not on file   Social History Narrative    Family History  Problem Relation Age of Onset  . Mental retardation Mother   . Hypertension Mother   . Hyperlipidemia Mother   . Heart disease Mother   . Depression Mother   . Hypertension Father   . Hyperlipidemia Father   . Heart disease Father   . Mental retardation Father     . Diabetes Father   . Cancer Maternal Aunt   . Stroke Maternal Grandmother     The following portions of the patient's history were reviewed and updated as appropriate: allergies, current medications, past family history, past medical history, past social history, past surgical history and problem list.  Review of Systems Pertinent items are noted in HPI.   Objective:   BP 127/77 mmHg  Pulse 94  Temp(Src) 97.4 F (36.3 C) (Oral)  Ht 5\' 1"  (1.549 m)  Wt 149 lb 8 oz (67.813 kg)  BMI 28.26 kg/m2  LMP 03/26/2015 GENERAL: Well-developed, well-nourished female in no acute distress.  HEENT: Normocephalic, atraumatic. Sclerae anicteric.  NECK: Supple. Normal thyroid.  LUNGS: Clear to auscultation bilaterally.  HEART: Regular rate and rhythm. BREASTS: Symmetric in size. No masses, skin changes, nipple drainage, or lymphadenopathy. ABDOMEN: Soft, nontender, nondistended. No organomegaly. PELVIC: Normal external female genitalia. Vagina is pink and rugated.  Bloody discharge. Normal cervix contour. Pap smear obtained. Uterus is normal in size. No adnexal mass or tenderness.  EXTREMITIES: No cyanosis, clubbing, or edema, 2+ distal pulses.   IUD Removal and Reinsertion  Patient identified, informed consent performed, consent signed.   Discussed risks of irregular bleeding, cramping, infection, malpositioning or misplacement of the IUD outside the uterus which may require further procedures. Also advised to use backup contraception for one week as the risk of pregnancy is higher during the transition period of removing an IUD and replacing it with another one. Time out was performed. Speculum placed in the vagina. The strings of the IUD were grasped and pulled using ring forceps. The IUD was successfully removed in its entirety. The cervix was cleaned with Betadine x 2 and grasped anteriorly with a single tooth tenaculum.  The new Paragard IUD insertion apparatus was inserted into the uterus;   the IUD was then placed per manufacturer's recommendations. Strings trimmed to 3 cm. Tenaculum was removed, good hemostasis noted. Patient tolerated procedure well. Patient was given post-procedure instructions.  She was reminded to have backup contraception for one week during this transition period between IUDs.  Patient was also asked to check IUD strings periodically and follow up in 4 weeks for IUD check.   Assessment:   Annual gynecologic examination   Plan:   Pap done, will follow up results and manage accordingly. IUD check in 4 weeks Routine preventative health maintenance measures emphasized   Jaynie CollinsUGONNA  Sharren Schnurr, MD, FACOG Attending Obstetrician & Gynecologist Center for [facility], [facility]

## 2015-03-29 NOTE — Patient Instructions (Signed)

## 2015-03-30 LAB — CYTOLOGY - PAP

## 2015-04-03 NOTE — Progress Notes (Signed)
Quick Note:  Normal pap smear and negative high-risk HPV on 03/28/15. Continue cervical cancer screening as recommended.   Pap also showed Actinomyces; this is commonly seen in patient with IUDs in place. Patient is asymptomatic, no treatment indicated at this point. If she shows signs or symptoms of pelvic infection (pelvic mass or pain, uterine tenderness), antibiotics (PCN) should be administered followed by removal of the IUD. Will evaluate patient at follow up visit.   Christina CollinsUGONNA [name], MD, FACOG Attending Obstetrician & Gynecologist Center for [facility], [facility] ______

## 2015-04-09 ENCOUNTER — Telehealth: Payer: Self-pay | Admitting: Cardiovascular Disease

## 2015-04-09 ENCOUNTER — Other Ambulatory Visit: Payer: Self-pay | Admitting: *Deleted

## 2015-04-09 MED ORDER — ALPRAZOLAM 0.25 MG PO TABS: 0.2500 mg | ORAL_TABLET | Freq: Three times a day (TID) | ORAL | Status: DC | PRN

## 2015-04-09 NOTE — Telephone Encounter (Signed)
I contacted the [facility] and verified that they do not fill Xanax.  [facility]. Pt aware.

## 2015-04-09 NOTE — Telephone Encounter (Signed)
Patient was given an rx for xanax by Ward Givenshris Berge. She tells me that her appointment with her pcp is not until 04/16/15 and would like to see if this could be refilled until then. Please advise. Thanks, MI

## 2015-04-09 NOTE — Telephone Encounter (Signed)
I spoke with Ward Givenshris Berge NP and he will authorize another Rx for Xanax. Xanax 0.25mg  take one tablet by mouth three times a day as needed for anxiety #21 and no refills.  Per Thayer OhmChris this is the last time we will fill this medication.

## 2015-04-09 NOTE — Telephone Encounter (Signed)
New message      Pt want xanax called in to [facility] [address]  [facility] does not carry it.  Please call when it has been called in

## 2015-04-11 ENCOUNTER — Observation Stay (HOSPITAL_COMMUNITY)
Admission: EM | Admit: 2015-04-11 | Discharge: 2015-04-12 | Disposition: A | Discharge status: Home / Self Care | Payer: Self-pay | Attending: Cardiology | Admitting: Cardiology

## 2015-04-11 ENCOUNTER — Encounter (HOSPITAL_COMMUNITY): Payer: Self-pay | Admitting: Family Medicine

## 2015-04-11 ENCOUNTER — Emergency Department (HOSPITAL_COMMUNITY): Payer: Medicaid Other

## 2015-04-11 ENCOUNTER — Encounter (HOSPITAL_COMMUNITY)
Admission: EM | Disposition: A | Discharge status: Home / Self Care | Payer: Self-pay | Source: Home / Self Care | Attending: Emergency Medicine

## 2015-04-11 DIAGNOSIS — E785 Hyperlipidemia, unspecified: Secondary | ICD-10-CM | POA: Insufficient documentation

## 2015-04-11 DIAGNOSIS — Z9861 Coronary angioplasty status: Secondary | ICD-10-CM

## 2015-04-11 DIAGNOSIS — I200 Unstable angina: Secondary | ICD-10-CM | POA: Diagnosis present

## 2015-04-11 DIAGNOSIS — I2510 Atherosclerotic heart disease of native coronary artery without angina pectoris: Secondary | ICD-10-CM

## 2015-04-11 DIAGNOSIS — M1990 Unspecified osteoarthritis, unspecified site: Secondary | ICD-10-CM | POA: Insufficient documentation

## 2015-04-11 DIAGNOSIS — Z7982 Long term (current) use of aspirin: Secondary | ICD-10-CM | POA: Insufficient documentation

## 2015-04-11 DIAGNOSIS — I214 Non-ST elevation (NSTEMI) myocardial infarction: Secondary | ICD-10-CM

## 2015-04-11 DIAGNOSIS — I252 Old myocardial infarction: Secondary | ICD-10-CM | POA: Insufficient documentation

## 2015-04-11 DIAGNOSIS — Z8673 Personal history of transient ischemic attack (TIA), and cerebral infarction without residual deficits: Secondary | ICD-10-CM | POA: Insufficient documentation

## 2015-04-11 DIAGNOSIS — F1410 Cocaine abuse, uncomplicated: Secondary | ICD-10-CM | POA: Insufficient documentation

## 2015-04-11 DIAGNOSIS — G8929 Other chronic pain: Secondary | ICD-10-CM | POA: Insufficient documentation

## 2015-04-11 DIAGNOSIS — F17210 Nicotine dependence, cigarettes, uncomplicated: Secondary | ICD-10-CM | POA: Insufficient documentation

## 2015-04-11 DIAGNOSIS — F319 Bipolar disorder, unspecified: Secondary | ICD-10-CM | POA: Insufficient documentation

## 2015-04-11 DIAGNOSIS — F419 Anxiety disorder, unspecified: Secondary | ICD-10-CM | POA: Insufficient documentation

## 2015-04-11 DIAGNOSIS — M419 Scoliosis, unspecified: Secondary | ICD-10-CM | POA: Insufficient documentation

## 2015-04-11 DIAGNOSIS — I25111 Atherosclerotic heart disease of native coronary artery with angina pectoris with documented spasm: Principal | ICD-10-CM | POA: Insufficient documentation

## 2015-04-11 DIAGNOSIS — M545 Low back pain: Secondary | ICD-10-CM | POA: Insufficient documentation

## 2015-04-11 DIAGNOSIS — Z79899 Other long term (current) drug therapy: Secondary | ICD-10-CM | POA: Insufficient documentation

## 2015-04-11 DIAGNOSIS — R079 Chest pain, unspecified: Secondary | ICD-10-CM

## 2015-04-11 DIAGNOSIS — Z955 Presence of coronary angioplasty implant and graft: Secondary | ICD-10-CM | POA: Insufficient documentation

## 2015-04-11 DIAGNOSIS — R110 Nausea: Secondary | ICD-10-CM | POA: Insufficient documentation

## 2015-04-11 HISTORY — PX: LEFT HEART CATHETERIZATION WITH CORONARY ANGIOGRAM: SHX5451

## 2015-04-11 LAB — URINALYSIS, ROUTINE W REFLEX MICROSCOPIC
Bilirubin Urine: NEGATIVE
GLUCOSE, UA: NEGATIVE mg/dL
KETONES UR: NEGATIVE mg/dL
Nitrite: POSITIVE — AB
PROTEIN: NEGATIVE mg/dL
Specific Gravity, Urine: 1.023 (ref 1.005–1.030)
Urobilinogen, UA: 0.2 mg/dL (ref 0.0–1.0)
pH: 5.5 (ref 5.0–8.0)

## 2015-04-11 LAB — I-STAT TROPONIN, ED: TROPONIN I, POC: 0.01 ng/mL (ref 0.00–0.08)

## 2015-04-11 LAB — CBC WITH DIFFERENTIAL/PLATELET
Basophils Absolute: 0 10*3/uL (ref 0.0–0.1)
Basophils Relative: 0 % (ref 0–1)
EOS ABS: 0.1 10*3/uL (ref 0.0–0.7)
EOS PCT: 2 % (ref 0–5)
HCT: 33.9 % — ABNORMAL LOW (ref 36.0–46.0)
HEMOGLOBIN: 11.7 g/dL — AB (ref 12.0–15.0)
LYMPHS ABS: 1.3 10*3/uL (ref 0.7–4.0)
Lymphocytes Relative: 32 % (ref 12–46)
MCH: 31 pg (ref 26.0–34.0)
MCHC: 34.5 g/dL (ref 30.0–36.0)
MCV: 89.9 fL (ref 78.0–100.0)
MONOS PCT: 4 % (ref 3–12)
Monocytes Absolute: 0.2 10*3/uL (ref 0.1–1.0)
Neutro Abs: 2.6 10*3/uL (ref 1.7–7.7)
Neutrophils Relative %: 62 % (ref 43–77)
Platelets: 196 10*3/uL (ref 150–400)
RBC: 3.77 MIL/uL — AB (ref 3.87–5.11)
RDW: 12.4 % (ref 11.5–15.5)
WBC: 4.1 10*3/uL (ref 4.0–10.5)

## 2015-04-11 LAB — URINE MICROSCOPIC-ADD ON

## 2015-04-11 LAB — CBC
HEMATOCRIT: 34.5 % — AB (ref 36.0–46.0)
HEMOGLOBIN: 11.9 g/dL — AB (ref 12.0–15.0)
MCH: 31.2 pg (ref 26.0–34.0)
MCHC: 34.5 g/dL (ref 30.0–36.0)
MCV: 90.6 fL (ref 78.0–100.0)
PLATELETS: 182 10*3/uL (ref 150–400)
RBC: 3.81 MIL/uL — AB (ref 3.87–5.11)
RDW: 12.4 % (ref 11.5–15.5)
WBC: 4.9 10*3/uL (ref 4.0–10.5)

## 2015-04-11 LAB — PLATELET INHIBITION P2Y12: PLATELET FUNCTION P2Y12: 261 [PRU] (ref 194–418)

## 2015-04-11 LAB — PROTIME-INR
INR: 1.1 (ref 0.00–1.49)
Prothrombin Time: 14.4 seconds (ref 11.6–15.2)

## 2015-04-11 LAB — COMPREHENSIVE METABOLIC PANEL
ALK PHOS: 100 U/L (ref 39–117)
ALT: 24 U/L (ref 0–35)
AST: 20 U/L (ref 0–37)
Albumin: 3.6 g/dL (ref 3.5–5.2)
Anion gap: 10 (ref 5–15)
BILIRUBIN TOTAL: 0.4 mg/dL (ref 0.3–1.2)
BUN: 6 mg/dL (ref 6–23)
CHLORIDE: 106 mmol/L (ref 96–112)
CO2: 21 mmol/L (ref 19–32)
Calcium: 8.6 mg/dL (ref 8.4–10.5)
Creatinine, Ser: 0.72 mg/dL (ref 0.50–1.10)
GFR calc non Af Amer: >90 mL/min (ref >90)
Glucose, Bld: 133 mg/dL — ABNORMAL HIGH (ref 70–99)
POTASSIUM: 3.4 mmol/L — AB (ref 3.5–5.1)
SODIUM: 137 mmol/L (ref 135–145)
TOTAL PROTEIN: 6.3 g/dL (ref 6.0–8.3)

## 2015-04-11 LAB — APTT: aPTT: 39 seconds — ABNORMAL HIGH (ref 24–37)

## 2015-04-11 LAB — BASIC METABOLIC PANEL
Anion gap: 9 (ref 5–15)
BUN: 11 mg/dL (ref 6–23)
CHLORIDE: 111 mmol/L (ref 96–112)
CO2: 21 mmol/L (ref 19–32)
CREATININE: 0.66 mg/dL (ref 0.50–1.10)
Calcium: 8.5 mg/dL (ref 8.4–10.5)
GFR calc Af Amer: >90 mL/min (ref >90)
GFR calc non Af Amer: >90 mL/min (ref >90)
Glucose, Bld: 92 mg/dL (ref 70–99)
POTASSIUM: 3.9 mmol/L (ref 3.5–5.1)
Sodium: 141 mmol/L (ref 135–145)

## 2015-04-11 LAB — RAPID URINE DRUG SCREEN, HOSP PERFORMED
AMPHETAMINES: NOT DETECTED
BARBITURATES: NOT DETECTED
BENZODIAZEPINES: POSITIVE — AB
Cocaine: NOT DETECTED
Opiates: NOT DETECTED
Tetrahydrocannabinol: NOT DETECTED

## 2015-04-11 LAB — MAGNESIUM: MAGNESIUM: 1.7 mg/dL (ref 1.5–2.5)

## 2015-04-11 LAB — TSH: TSH: 1.129 u[IU]/mL (ref 0.350–4.500)

## 2015-04-11 LAB — POC URINE PREG, ED: Preg Test, Ur: NEGATIVE

## 2015-04-11 SURGERY — LEFT HEART CATHETERIZATION WITH CORONARY ANGIOGRAM

## 2015-04-11 MED ORDER — NITROGLYCERIN 0.4 MG SL SUBL: 0.4000 mg | SUBLINGUAL_TABLET | SUBLINGUAL | Status: DC | PRN

## 2015-04-11 MED ORDER — BUPROPION HCL ER (SR) 150 MG PO TB12
150.0000 mg | ORAL_TABLET | Freq: Two times a day (BID) | ORAL | Status: DC
  Administered 2015-04-11 – 2015-04-12 (×2): 150 mg via ORAL
  Filled 2015-04-11 (×3): qty 1

## 2015-04-11 MED ORDER — MIDAZOLAM HCL 2 MG/2ML IJ SOLN
INTRAMUSCULAR | Status: AC
  Filled 2015-04-11: qty 2

## 2015-04-11 MED ORDER — DILTIAZEM HCL ER COATED BEADS 120 MG PO TB24
120.0000 mg | ORAL_TABLET | Freq: Every day | ORAL | Status: DC
  Administered 2015-04-11 – 2015-04-12 (×2): 120 mg via ORAL
  Filled 2015-04-11 (×2): qty 1

## 2015-04-11 MED ORDER — ASPIRIN 81 MG PO CHEW: 81.0000 mg | CHEWABLE_TABLET | ORAL | Status: DC

## 2015-04-11 MED ORDER — HEPARIN (PORCINE) IN NACL 2-0.9 UNIT/ML-% IJ SOLN
INTRAMUSCULAR | Status: AC
  Filled 2015-04-11: qty 1500

## 2015-04-11 MED ORDER — ASPIRIN EC 81 MG PO TBEC
81.0000 mg | DELAYED_RELEASE_TABLET | Freq: Every day | ORAL | Status: DC
  Administered 2015-04-12: 81 mg via ORAL
  Filled 2015-04-11: qty 1

## 2015-04-11 MED ORDER — FENTANYL CITRATE (PF) 100 MCG/2ML IJ SOLN
INTRAMUSCULAR | Status: AC
  Filled 2015-04-11: qty 2

## 2015-04-11 MED ORDER — NITROGLYCERIN 2 % TD OINT
0.5000 [in_us] | TOPICAL_OINTMENT | Freq: Four times a day (QID) | TRANSDERMAL | Status: DC
  Administered 2015-04-11: 0.5 [in_us] via TOPICAL
  Filled 2015-04-11: qty 1

## 2015-04-11 MED ORDER — ASPIRIN EC 81 MG PO TBEC: 81.0000 mg | DELAYED_RELEASE_TABLET | Freq: Every day | ORAL | Status: DC

## 2015-04-11 MED ORDER — SODIUM CHLORIDE 0.9 % IJ SOLN
3.0000 mL | Freq: Two times a day (BID) | INTRAMUSCULAR | Status: DC
  Administered 2015-04-11 – 2015-04-12 (×3): 3 mL via INTRAVENOUS

## 2015-04-11 MED ORDER — SODIUM CHLORIDE 0.9 % IV SOLN
INTRAVENOUS | Status: DC
  Administered 2015-04-11: 12:00:00 via INTRAVENOUS

## 2015-04-11 MED ORDER — LIDOCAINE HCL (PF) 1 % IJ SOLN
INTRAMUSCULAR | Status: AC
  Filled 2015-04-11: qty 30

## 2015-04-11 MED ORDER — OXYCODONE-ACETAMINOPHEN 5-325 MG PO TABS
1.0000 | ORAL_TABLET | ORAL | Status: DC | PRN
  Administered 2015-04-11: 1 via ORAL
  Filled 2015-04-11: qty 1

## 2015-04-11 MED ORDER — HEPARIN SODIUM (PORCINE) 1000 UNIT/ML IJ SOLN
INTRAMUSCULAR | Status: AC
  Filled 2015-04-11: qty 1

## 2015-04-11 MED ORDER — PRAVASTATIN SODIUM 40 MG PO TABS
40.0000 mg | ORAL_TABLET | Freq: Every day | ORAL | Status: DC
  Administered 2015-04-11: 40 mg via ORAL
  Filled 2015-04-11 (×2): qty 1

## 2015-04-11 MED ORDER — ISOSORBIDE MONONITRATE ER 30 MG PO TB24
30.0000 mg | ORAL_TABLET | Freq: Every day | ORAL | Status: DC
  Administered 2015-04-11 – 2015-04-12 (×2): 30 mg via ORAL
  Filled 2015-04-11 (×3): qty 1

## 2015-04-11 MED ORDER — ALPRAZOLAM 0.25 MG PO TABS
0.2500 mg | ORAL_TABLET | Freq: Three times a day (TID) | ORAL | Status: DC | PRN
  Administered 2015-04-11 – 2015-04-12 (×3): 0.25 mg via ORAL
  Filled 2015-04-11 (×3): qty 1

## 2015-04-11 MED ORDER — MORPHINE SULFATE 2 MG/ML IJ SOLN: 2.0000 mg | INTRAMUSCULAR | Status: DC | PRN

## 2015-04-11 MED ORDER — SODIUM CHLORIDE 0.9 % IJ SOLN: 3.0000 mL | INTRAMUSCULAR | Status: DC | PRN

## 2015-04-11 MED ORDER — SODIUM CHLORIDE 0.9 % IV SOLN: INTRAVENOUS | Status: AC

## 2015-04-11 MED ORDER — ACETAMINOPHEN 325 MG PO TABS
650.0000 mg | ORAL_TABLET | ORAL | Status: DC | PRN
  Administered 2015-04-12: 650 mg via ORAL
  Filled 2015-04-11: qty 2

## 2015-04-11 MED ORDER — SODIUM CHLORIDE 0.9 % IV SOLN: 250.0000 mL | INTRAVENOUS | Status: DC | PRN

## 2015-04-11 MED ORDER — OXYCODONE-ACETAMINOPHEN 5-325 MG PO TABS
1.0000 | ORAL_TABLET | ORAL | Status: DC | PRN
  Administered 2015-04-11 – 2015-04-12 (×5): 2 via ORAL
  Filled 2015-04-11 (×5): qty 2

## 2015-04-11 MED ORDER — ONDANSETRON HCL 4 MG/2ML IJ SOLN
4.0000 mg | Freq: Once | INTRAMUSCULAR | Status: AC
Start: 2015-04-11 — End: 2015-04-11
  Administered 2015-04-11: 4 mg via INTRAVENOUS
  Filled 2015-04-11: qty 2

## 2015-04-11 MED ORDER — CLOPIDOGREL BISULFATE 75 MG PO TABS
75.0000 mg | ORAL_TABLET | Freq: Every day | ORAL | Status: DC
  Administered 2015-04-12: 75 mg via ORAL
  Filled 2015-04-11 (×2): qty 1

## 2015-04-11 MED ORDER — VERAPAMIL HCL 2.5 MG/ML IV SOLN
INTRAVENOUS | Status: AC
  Filled 2015-04-11: qty 2

## 2015-04-11 MED ORDER — CLOPIDOGREL BISULFATE 75 MG PO TABS
75.0000 mg | ORAL_TABLET | Freq: Once | ORAL | Status: AC
  Administered 2015-04-11: 75 mg via ORAL
  Filled 2015-04-11: qty 1

## 2015-04-11 MED ORDER — ONDANSETRON HCL 4 MG/2ML IJ SOLN
4.0000 mg | Freq: Four times a day (QID) | INTRAMUSCULAR | Status: DC | PRN
  Administered 2015-04-12: 4 mg via INTRAVENOUS
  Filled 2015-04-11: qty 2

## 2015-04-11 NOTE — ED Notes (Signed)
Pt back from x-ray.

## 2015-04-11 NOTE — H&P (Addendum)
Admit date: 04/11/2015 Referring Physician: Dr. Donnald Garre Primary Cardiologist: Dr.  Antony Contras complaint/reason for admission:Chest pain  HPI: Christina Morrison is a 39 y.o. female with a history of bipolar disorder, coronary artery disease, and recent NSTEMI, who presents to the ED complaining of substernal chest pain that woke her up at 5 am this morning. Patient reports she woke up at 5 AM this morning with 10 out of 10 substernal chest pain. She reports she took 2 nitroglycerin and her pain eased off and she fell back to sleep. She reports she woke up again at 7 AM with 10 out of 10 chest pain again and she called EMS. Patient reports receiving 324 mg of aspirin and 2 nitroglycerin by EMS prior to arrival. She reports her pain is currently down to an 8 out of 10 that she describes as dull and pressure. She is complaining of her pain in her substernal chest that sometimes radiates up into her throat and into her mid back. She reports pain feels similar to her recent NSTEMI, but this time her pain does not go into her jaw. She denies exertional pain, but reports her pain can fluctuate in intensity at rest. She reports shortness of breath when her pain worsens, but none currently. She did not take her morning medications this am, including her plavix. The patient denies personal or family history of DVTs or PEs. The patient denies personal or family history of blood clotting disorders such as factor V Leiden, protein C or S deficiency. The patient is not on exogenous estrogens. The patient denies fevers, cough, wheezing, vomiting, abdominal pain, or rashes. The patient complains of some dysuria starting yesterday, but denies other urinary symptoms. She currently is nauseated and just received Zofran.  She denies any cocaine use recently and said the cocaine she did 2 weeks ago was for a bachelorette party.    PMH:    Past Medical History  Diagnosis Date  . Depression   . Anxiety   . Bipolar 1 disorder   .  Scoliosis   . Migraine   . Osteoarthritis   . Coronary artery disease     a. 03/2015 NSTEMI/PCI: LM 20ost, LAD 30-40p/m, D1 40-50ost, 90p (2.75x12 Vision BMS), LCX 50p/m, RCA 40-8m, EF 65-70% (UDS +for cocaine).  [name] TIA (transient ischemic attack)   . Polysubstance abuse     a. tobacco/cocaine    PSH:    Past Surgical History  Procedure Laterality Date  . Coronary stent placement  03/21/2015    first diagonal  . Left heart catheterization with coronary angiogram N/A 03/21/2015    Procedure: LEFT HEART CATHETERIZATION WITH CORONARY ANGIOGRAM;  Surgeon: [name], MD;  Location[facility] CATH LAB;  Service: Cardiovascular;  Laterality: N/A;  . Cardiac catheterization  03/21/2015    Procedure: CORONARY STENT INTERVENTION;  Surgeon: [name], MD;  Location: [facility] CATH LAB;  Service: Cardiovascular;;  diag bms 2.75 x 12 vision    ALLERGIES:   Review of patient's allergies indicates no known allergies.  Prior to Admit Meds:   (Not in a hospital admission) Family HX:    Family History  Problem Relation Age of Onset  . Mental retardation Mother   . Hypertension Mother   . Hyperlipidemia Mother   . Heart disease Mother   . Depression Mother   . Hypertension Father   . Hyperlipidemia Father   . Heart disease Father   . Mental retardation Father   . Diabetes Father   . Cancer Maternal  Aunt   . Stroke Maternal Grandmother    Social HX:    History   Social History  . Marital Status: Divorced    Spouse Name: N/A  . Number of Children: N/A  . Years of Education: N/A   Occupational History  . Not on file.   Social History Main Topics  . Smoking status: Current Every Day Smoker -- 1.00 packs/day    Types: Cigarettes  . Smokeless tobacco: Never Used  . Alcohol Use: No     Comment: once a month  . Drug Use: Yes    Special: Cocaine  . Sexual Activity: Not on file   Other Topics Concern  . Not on file   Social History Narrative     ROS:  All 11 ROS were addressed and are  negative except what is stated in the HPI  PHYSICAL EXAM Filed Vitals:   04/11/15 1100  BP: 111/74  Pulse: 72  Temp:   Resp: 20   General: Well developed, well nourished, in no acute distress Head: Eyes PERRLA, No xanthomas.   Normal cephalic and atramatic  Lungs:   Clear bilaterally to auscultation and percussion. Heart:   HRRR S1 S2 Pulses are 2+ & equal.            No carotid bruit. No JVD.  No abdominal bruits. No femoral bruits. Abdomen: Bowel sounds are positive, abdomen soft and non-tender without masses  Extremities:   No clubbing, cyanosis or edema.  DP +1 Neuro: Alert and oriented X 3. Psych:  Good affect, responds appropriately   Labs:   Lab Results  Component Value Date   WBC 4.9 04/11/2015   HGB 11.9* 04/11/2015   HCT 34.5* 04/11/2015   MCV 90.6 04/11/2015   PLT 182 04/11/2015    Recent Labs Lab 04/11/15 0910  NA 141  K 3.9  CL 111  CO2 21  BUN 11  CREATININE 0.66  CALCIUM 8.5  GLUCOSE 92   Lab Results  Component Value Date   TROPONINI 1.44* 03/22/2015   No results found for: PTT Lab Results  Component Value Date   INR 1.00 03/21/2015   INR 1.05 03/20/2015     Lab Results  Component Value Date   CHOL 154 03/22/2015   CHOL 182 03/21/2015   Lab Results  Component Value Date   HDL 38* 03/22/2015   HDL 40 03/21/2015   Lab Results  Component Value Date   LDLCALC 92 03/22/2015   LDLCALC 111* 03/21/2015   Lab Results  Component Value Date   TRIG 119 03/22/2015   TRIG 157* 03/21/2015   Lab Results  Component Value Date   CHOLHDL 4.1 03/22/2015   CHOLHDL 4.6 03/21/2015   No results found for: LDLDIRECT    Radiology:  No results found.  EKG:  NSR with no ST changes  ASSESSMENT/PLAN:  1.  Acute chest pain c/w unstable angina.  EKG is nonischemic and initial troponin is normal.  Her CP is identical to pain 2 weeks ago at time of her NSTEMI.  She looks uncomfortable in the bed and is nauseated.  At this time would recommend taking  to the cath lab for a relook since she had a PCI 2 weeks ago to assess patency of stent.  Continue ASA/plavix/statin/long acting nitrate.  Hold on BB given history of cocaine use in the past.  Check 2D echo to assess LVF and RWMA's 2.  ASCAD with cath 4/6 with 20% ostial LM, 20-30% prox to  mid LAD, 30-40% mid LAD, 40-50% ostial and 90% prox diag s/p PCI, 50% long prox LCx and 40-50% mid RCA.  Continue ASA/plavix/statin.   3.  Polysubstance abuse - she denies any recent cocaine use. 4.  Bipolar d/o 5.  Depression 6.  Dyslipidemia on statin therapy 7.  Chronic back pain on narcotics at home.    Quintella ReichertURNER,Kaiyu Mirabal R, MD  04/11/2015  11:15 AM

## 2015-04-11 NOTE — ED Provider Notes (Signed)
CSN: [id number]     Arrival date & time 04/11/15  0825 History   First MD Initiated Contact with Patient 04/11/15 0930     Chief Complaint  Patient presents with  . Chest Pain   Christina Morrison is a 39 y.o. female with a history of bipolar disorder, coronary artery disease, and recent NSTEMI, who presents to the ED complaining of substernal chest pain that woke her up at 5 am this morning. She had a recent NSTEMI with stent placement on 03/21/15 by Dr. [name]. Patient reports she woke up at 5 AM this morning with 10 out of 10 substernal chest pain. She reports she took 2 nitroglycerin and her pain eased off and she fell back to sleep. She reports she woke up again at 7 AM with 10 out of 10 chest pain again and she called EMS. Patient reports receiving 324 mg of aspirin and 2 nitroglycerin by EMS prior to arrival. She reports her pain is currently down to an 8 out of 10 that she describes as dull and pressure. She is complaining of her pain in her substernal chest that sometimes radiates up into her throat and into her mid back. She reports pain feels similar to her recent NSTEMI, but this time her pain does not go into her jaw. She denies exertional pain, but reports her pain can fluctuate in intensity at rest. She reports shortness of breath when her pain worsens, but none currently. She did not take her morning medications this am, including her plavix. The patient denies personal or family history of DVTs or PEs. The patient denies personal or family history of blood clotting disorders such as factor V Leiden, protein C or S deficiency. The patient is not on exogenous estrogens. The patient denies fevers, cough, wheezing, vomiting, abdominal pain, or rashes. The patient complains of some dysuria starting yesterday, but denies other urinary symptoms.   (Consider location/radiation/quality/duration/timing/severity/associated sxs/prior Treatment) HPI  Past Medical History  Diagnosis Date  .  Depression   . Anxiety   . Bipolar 1 disorder   . Scoliosis   . Migraine   . Osteoarthritis   . Coronary artery disease     a. 03/2015 NSTEMI/PCI: LM 20ost, LAD 30-40p/m, D1 40-50ost, 90p (2.75x12 Vision BMS), LCX 50p/m, RCA 40-44m, EF 65-70% (UDS +for cocaine).  [name] TIA (transient ischemic attack)   . Polysubstance abuse     a. tobacco/cocaine   Past Surgical History  Procedure Laterality Date  . Coronary stent placement  03/21/2015    first diagonal  . Left heart catheterization with coronary angiogram N/A 03/21/2015    Procedure: LEFT HEART CATHETERIZATION WITH CORONARY ANGIOGRAM;  Surgeon: [name], MD;  Location: [facility] CATH LAB;  Service: Cardiovascular;  Laterality: N/A;  . Cardiac catheterization  03/21/2015    Procedure: CORONARY STENT INTERVENTION;  Surgeon: [name], MD;  Location: [facility] CATH LAB;  Service: Cardiovascular;;  diag bms 2.75 x 12 vision   Family History  Problem Relation Age of Onset  . Mental retardation Mother   . Hypertension Mother   . Hyperlipidemia Mother   . Heart disease Mother   . Depression Mother   . Hypertension Father   . Hyperlipidemia Father   . Heart disease Father   . Mental retardation Father   . Diabetes Father   . Cancer Maternal Aunt   . Stroke Maternal Grandmother    History  Substance Use Topics  . Smoking status: Current Every Day Smoker -- 1.00 packs/day  Types: Cigarettes  . Smokeless tobacco: Never Used  . Alcohol Use: No     Comment: once a month   OB History    Gravida Para Term Preterm AB TAB SAB Ectopic Multiple Living   0 0 0 0 0 0 2     Review of Systems  Constitutional: Negative for fever and chills.  HENT: Negative for congestion and sore throat.   Eyes: Negative for visual disturbance.  Respiratory: Positive for chest tightness and shortness of breath. Negative for cough and wheezing.   Cardiovascular: Positive for chest pain. Negative for palpitations and leg swelling.  Gastrointestinal:  Positive for nausea. Negative for vomiting, abdominal pain, diarrhea and blood in stool.  Genitourinary: Positive for dysuria. Negative for urgency, frequency, hematuria, flank pain, vaginal bleeding, vaginal discharge and difficulty urinating.  Musculoskeletal: Positive for back pain. Negative for joint swelling, gait problem and neck pain.  Skin: Negative for rash.  Neurological: Negative for dizziness, weakness, light-headedness, numbness and headaches.      Allergies  Review of patient's allergies indicates no known allergies.  Home Medications   Prior to Admission medications   Medication Sig Start Date End Date Taking? Authorizing Provider  ALPRAZolam (XANAX) 0.25 MG tablet Take 1 tablet (0.25 mg total) by mouth 3 (three) times daily as needed for anxiety. 04/09/15  Yes Ok Anis, NP  aspirin EC 81 MG EC tablet Take 1 tablet (81 mg total) by mouth daily. 03/23/15  Yes Ok Anis, NP  buPROPion [facility] SR) 150 MG 12 hr tablet Take 1 tablet (150 mg total) by mouth 2 (two) times daily. 03/27/15  Yes Ok Anis, NP  clopidogrel (PLAVIX) 75 MG tablet Take 1 tablet (75 mg total) by mouth daily with breakfast. 03/23/15  Yes Ok Anis, NP  diltiazem (CARDIZEM LA) 120 MG 24 hr tablet Take 1 tablet (120 mg total) by mouth daily. 03/23/15  Yes Ok Anis, NP  fluticasone (FLONASE) 50 MCG/ACT nasal spray Place 2 sprays into both nostrils daily as needed for allergies or rhinitis.   Yes Historical Provider, MD  isosorbide mononitrate (IMDUR) 30 MG 24 hr tablet Take 1 tablet (30 mg total) by mouth daily. 03/23/15  Yes Ok Anis, NP  nitroGLYCERIN (NITROSTAT) 0.4 MG SL tablet Place 1 tablet (0.4 mg total) under the tongue every 5 (five) minutes as needed for chest pain. 03/23/15  Yes Ok Anis, NP  oxyCODONE-acetaminophen (PERCOCET/ROXICET) 5-325 MG per tablet Take 1 tablet by mouth every 4 (four) hours as needed for moderate pain or severe  pain (Back and knee pain).   Yes Historical Provider, MD  pravastatin (PRAVACHOL) 40 MG tablet Take 1 tablet (40 mg total) by mouth daily. 03/23/15  Yes Ok Anis, NP  nicotine (NICODERM CQ) 14 mg/24hr patch Place 1 patch (14 mg total) onto the skin daily. Patient not taking: Reported on 03/28/2015 03/23/15   Ok Anis, NP   BP 116/79 mmHg  Pulse 73  Temp(Src) 98.7 F (37.1 C) (Oral)  Resp 19  SpO2 100%  LMP 03/26/2015 Physical Exam  Constitutional: She is oriented to person, place, and time. She appears well-developed and well-nourished. No distress.  HENT:  Head: Normocephalic and atraumatic.  Mouth/Throat: Oropharynx is clear and moist. No oropharyngeal exudate.  Eyes: Conjunctivae are normal. Pupils are equal, round, and reactive to light. Right eye exhibits no discharge. Left eye exhibits no discharge.  Neck: Neck supple. No JVD present. No tracheal deviation present.  Cardiovascular: Normal rate, regular rhythm, normal heart sounds and intact distal pulses.  Exam reveals no gallop and no friction rub.   No murmur heard. Bilateral radial, posterior tibialis and dorsalis pedis pulses are intact.   Pulmonary/Chest: Effort normal and breath sounds normal. No respiratory distress. She has no wheezes. She has no rales. She exhibits no tenderness.  Abdominal: Soft. She exhibits no distension. There is no tenderness.  Musculoskeletal: She exhibits no edema or tenderness.  No lower extremity edema or tenderness.   Lymphadenopathy:    She has no cervical adenopathy.  Neurological: She is alert and oriented to person, place, and time. Coordination normal.  Skin: Skin is warm and dry. No rash noted. She is not diaphoretic. No erythema. No pallor.  Psychiatric: She has a normal mood and affect. Her behavior is normal.  Nursing note and vitals reviewed.   ED Course  Procedures (including critical care time) Labs Review Labs Reviewed  CBC - Abnormal; Notable for the  following:    RBC 3.81 (*)    Hemoglobin 11.9 (*)    HCT 34.5 (*)    All other components within normal limits  BASIC METABOLIC PANEL  I-STAT TROPOININ, ED  POC URINE PREG, ED    Imaging Review Dg Chest 2 View  04/11/2015   CLINICAL DATA:  Chest pressure  EXAM: CHEST  2 VIEW  COMPARISON:  March 20, 2015  FINDINGS: Lungs are clear. Heart size and pulmonary vascularity are normal. No adenopathy. No pneumothorax. No bone lesions.  IMPRESSION: No edema or consolidation.   Electronically Signed   By: Bretta BangWilliam  Woodruff III M.D.   On: 04/11/2015 10:52     EKG Interpretation   Date/Time:  Wednesday April 11 2015 08:33:05 EDT Ventricular Rate:  75 PR Interval:  152 QRS Duration: 90 QT Interval:  395 QTC Calculation: 441 R Axis:   64 Text Interpretation:  Sinus rhythm normal Confirmed by Donnald GarrePfeiffer, MD, Lebron ConnersMarcy  [phone]) on 04/11/2015 8:36:10 AM      Filed Vitals:   04/11/15 0841 04/11/15 0845 04/11/15 0900 04/11/15 0930  BP: 112/77 118/75 115/66 116/79  Pulse: 80 77 78 73  Temp: 98.7 F (37.1 C)     TempSrc: Oral     Resp: 18 17 19    SpO2: 100% 100% 100% 100%     MDM   Meds given in ED:  Medications - No data to display  New Prescriptions   No medications on file    Final diagnoses:  Chest pain, unspecified chest pain type   This is a 39 y.o. female with a history of bipolar disorder, coronary artery disease, and recent NSTEMI, who presents to the ED complaining of substernal chest pain that woke her up at 5 am this morning. She had a recent NSTEMI with stent placement on 03/21/15 by Dr. Tonny BollmanMichael Cooper. Patient reports she woke up at 5 AM this morning with 10 out of 10 substernal chest pain. Patient given her daily dose of plavix and nitro paste applied. Blood work and CXR pending.   10:15 Initial troponin negative. Consulted Trish with cardiology. Will do UDS. Patient was cocaine positive last visit. If cocaine positive cardiology will just consult and not admit.   CBC shows  a hemoglobin of 11.9. BMP is within normal limits. Chest x-ray is negative for acute cardiopulmonary disease.  UDS is negative for cocaine. Cardiology evaluated the patient and the patient was admitted by cardiology to telemetry.  Urinalysis came back nitrite positive after admission. Urine  culture ordered. Patient had been complaining of dysuria since yesterday. Patient admitted.   This patient was discussed with Dr. Donnald Garre who agrees with assessment and plan.      Everlene Farrier, PA-C 04/11/15 1456  Arby Barrette, MD 04/14/15 [phone]

## 2015-04-11 NOTE — Interval H&P Note (Signed)
History and Physical Interval Note:  04/11/2015 1:24 PM  Christina Morrison  has presented today for cardiac cath with the diagnosis of chest pain/unstable angina, CAD. The various methods of treatment have been discussed with the patient and family. After consideration of risks, benefits and other options for treatment, the patient has consented to  Procedure(s): LEFT HEART CATHETERIZATION WITH CORONARY ANGIOGRAM (N/A) as a surgical intervention .  The patient's history has been reviewed, patient examined, no change in status, stable for surgery.  I have reviewed the patient's chart and labs.  Questions were answered to the patient's satisfaction.    Cath Lab Visit (complete for each Cath Lab visit)  Clinical Evaluation Leading to the Procedure:   ACS: No.  Non-ACS:    Anginal Classification: CCS III  Anti-ischemic medical therapy: Maximal Therapy (2 or more classes of medications)  Non-Invasive Test Results: No non-invasive testing performed  Prior CABG: No previous CABG         MCALHANY,CHRISTOPHER

## 2015-04-11 NOTE — ED Notes (Signed)
Pt having chest pain that started at 5 am. sts intermittent. sts took 2 nitro with some relief. sts recent NSTEMI. sts 324 ASA. 20 in left hand. sts SOB with pain.

## 2015-04-11 NOTE — CV Procedure (Signed)
Cardiac Catheterization Operative Report  Christina Morrison [id number] 4/27/20161:38 PM Georgann Housekeeper, MD  Procedure Performed:  1. Left Heart Catheterization 2. Selective Coronary Angiography 3. Left ventricular angiogram  Operator: Verne Carrow, MD  Arterial access site:  Right radial artery.   Indication: 39 yo female with history of CAD with recent admission for NSTEMI 03/20/15 at which time she underwent cardiac cath per Dr. Excell Seltzer. She was found to have diffuse moderate CAD with severe stenosis in the Diagonal treated with a bare metal stent x 1. She is now presenting to the ED with recurrent chest pain. Troponin is negative.                                  Procedure Details: The risks, benefits, complications, treatment options, and expected outcomes were discussed with the patient. The patient and/or family concurred with the proposed plan, giving informed consent. The patient was brought to the cath lab after IV hydration was begun and oral premedication was given. The patient was further sedated with Versed and Fentanyl. The right wrist was assessed with a modified Allens test which was positive. The right wrist was prepped and draped in a sterile fashion. 1% lidocaine was used for local anesthesia. Using the modified Seldinger access technique, a 5 French sheath was placed in the right radial artery. 3 mg Verapamil was given through the sheath. 3500 units IV heparin was given. Standard diagnostic catheters were used to perform selective coronary angiography. I ultimately engaged the left main with a EBU 3.0 guiding catheter to better engage the vessel and for better visualization. IC NTG was given. A pigtail catheter was used to perform a left ventricular angiogram. The sheath was removed from the right radial artery and a Terumo hemostasis band was applied at the arteriotomy site on the right wrist.   There were no immediate complications. The patient was taken to the  recovery area in stable condition.   EBL: minimal  Hemodynamic Findings: Central aortic pressure: 132/72 Left ventricular pressure: 127/2/20  Angiographic Findings:  Left main: Normal caliber vessel that divides into the LAD and left circumflex.. Ostial 20% stenosis.   Left Anterior Descending Artery: Large caliber vessel that courses to the apex. There is a focal 20% mid stenosis at the takeoff of the first diagonal branch and a 30-40% stenosis in the mid LAD at the takeoff of the septal perforating branch. The first diagonal branch is a moderate caliber vessel with ostial 30% stenosis and a patent stent in the mid vessel with no restenosis.   Circumflex Artery: Moderate caliber vessel with small caliber obtuse marginal branch. The proximal vessel has diffuse 50% stenosis. The mid AV groove Circumflex has a 50% stenosis beyond the bifurcation of the first obtuse marginal branch. The first obtuse marginal branch has an ostial 50% stenosis. Of note, the vessel was noted to have diffuse vasospasm throughout that responded to IC NTG. The disease in the is vessel is unchanged from last cath and does not appear to be flow limiting.   Right Coronary Artery: Moderate caliber dominant vessel. The mid RCA has 40% stenosis. The distal vessel has a focal 30% stenosis.  The PDA and PLA branches are patent. 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   Left Ventricular Angiogram: LVEF=65%.   Impression: 1. Double vessel CAD with  patent stent in the diagonal, moderate disease in the Circumflex that is unchanged from the prior cath 3 weeks ago.  2. Coronary vasospasm noted during the catheterization 3. Normal LV systolic function  Recommendations: Continue medical management. Complete smoking cessation recommended with evidence of coronary vasospasm. Continue calcium channel blocker and long acting nitrate.          Complications:  None. The patient tolerated the procedure well.

## 2015-04-11 NOTE — ED Notes (Addendum)
Pt states this pain feels just like her last MI, "I can feel it in my throat just like last time and it ended up going into my jaw." Pt received 2 additional nitro en route, not noted in previous triage note. So pt has had total of 4 nitro.

## 2015-04-11 NOTE — ED Notes (Signed)
Pt monitored by pulse ox, bp cuff, and 12-lead. 

## 2015-04-12 DIAGNOSIS — R072 Precordial pain: Secondary | ICD-10-CM

## 2015-04-12 DIAGNOSIS — R079 Chest pain, unspecified: Secondary | ICD-10-CM

## 2015-04-12 LAB — CBC
HCT: 32.2 % — ABNORMAL LOW (ref 36.0–46.0)
Hemoglobin: 10.9 g/dL — ABNORMAL LOW (ref 12.0–15.0)
MCH: 30.7 pg (ref 26.0–34.0)
MCHC: 33.9 g/dL (ref 30.0–36.0)
MCV: 90.7 fL (ref 78.0–100.0)
PLATELETS: 184 10*3/uL (ref 150–400)
RBC: 3.55 MIL/uL — ABNORMAL LOW (ref 3.87–5.11)
RDW: 12.5 % (ref 11.5–15.5)
WBC: 4.7 10*3/uL (ref 4.0–10.5)

## 2015-04-12 LAB — HEMOGLOBIN A1C
HEMOGLOBIN A1C: 5.2 % (ref 4.8–5.6)
MEAN PLASMA GLUCOSE: 103 mg/dL

## 2015-04-12 LAB — BASIC METABOLIC PANEL
Anion gap: 8 (ref 5–15)
BUN: 11 mg/dL (ref 6–23)
CO2: 25 mmol/L (ref 19–32)
CREATININE: 0.72 mg/dL (ref 0.50–1.10)
Calcium: 8.8 mg/dL (ref 8.4–10.5)
Chloride: 106 mmol/L (ref 96–112)
GFR calc non Af Amer: >90 mL/min (ref >90)
Glucose, Bld: 92 mg/dL (ref 70–99)
POTASSIUM: 3.9 mmol/L (ref 3.5–5.1)
Sodium: 139 mmol/L (ref 135–145)

## 2015-04-12 MED ORDER — OXYCODONE-ACETAMINOPHEN 5-325 MG PO TABS: 1.0000 | ORAL_TABLET | Freq: Four times a day (QID) | ORAL | Status: DC | PRN

## 2015-04-12 MED ORDER — CEPHALEXIN 500 MG PO CAPS: 500.0000 mg | ORAL_CAPSULE | Freq: Three times a day (TID) | ORAL | Status: DC

## 2015-04-12 NOTE — Progress Notes (Signed)
  Echocardiogram 2D Echocardiogram has been performed.  Leta JunglingCooper, Yunuen Mordan M 04/12/2015, 9:55 AM

## 2015-04-12 NOTE — Progress Notes (Signed)
Pt discharge education and instructions completed with pt at bedside with family. All voices understanding and denies any questions. Pt IV and telemetry removed; cath site remains level 0, clean, dry and intact; adhesive bandaid applied to site. Pt handed her prescription for percocet and to pick up electronically sent prescription from preferred pharmacy on file. Pt discharge home with significant other to transport her home. Pt transported off unit via wheelchair with belongings and significant other at side. Arabella MerlesP. Amo Robinette Esters RN.

## 2015-04-12 NOTE — Progress Notes (Signed)
CARDIAC REHAB PHASE I   PRE:  Rate/Rhythm: 81 SR  BP:  Sitting: 103/68        SaO2: 99 RA  MODE:  Ambulation: 350 ft   POST:  Rate/Rhythm: 87 SR  BP:  Sitting: 107/71         SaO2: 99 RA  Pt agreeable to walk but states she "has a migraine."  Pt states she had chest pain earlier this morning. Pt ambulated 350 ft on RA, independent, steady gait. Pt states she "feels a little short of breath" during ambulation, denies CP, dizziness. VSS. Reviewed anti-platelet therapy, tobacco cessation, activity restrictions, ntg, exercise, heart healthy diet, phase 2 cardiac rehab. Pt verbalized understanding, states she has no questions. Pt not very receptive to education, states " I know everything I have to do, we don't need to go over it again."  Pt states she was "still smoking a little" prior to this hospitalization. When counseled on tobacco cessation pt states "yes I know for the millionth time."  Pt still agreeable to prior referral to phase 2 cardiac rehab.    [phone]     Joylene GrapesMonge, Kristien Salatino C, RN, BSN 04/12/2015 11:22 AM

## 2015-04-12 NOTE — Progress Notes (Signed)
Patient Profile: 39 y/o female with history of CAD with recent admission for NSTEMI 03/20/15 at which time she underwent cardiac cath per Dr. Excell Seltzer. She was found to have diffuse moderate CAD with severe stenosis in the diagonal treated with a bare metal stent x 1. She presented back 04/11/15 with recurrent CP. Admitted for re-look LHC.   Subjective: Feels ok. She had recurrent CP earlier this am that has resolved. Her main complaint now is a migraine HA.   Objective: Vital signs in last 24 hours: Temp:  [97.7 F (36.5 C)-97.9 F (36.6 C)] 97.9 F (36.6 C) (04/28 0356) Pulse Rate:  [69-86] 75 (04/28 1011) Resp:  [11-27] 20 (04/28 0356) BP: (92-140)/(64-87) 105/68 mmHg (04/28 1011) SpO2:  [99 %-100 %] 100 % (04/28 0356) Last BM Date: 04/11/15  Intake/Output from previous day:   Intake/Output this shift: Total I/O In: 120 [P.O.:120] Out: -   Medications Current Facility-Administered Medications  Medication Dose Route Frequency Provider Last Rate Last Dose  . 0.9 %  sodium chloride infusion  250 mL Intravenous PRN Quintella Reichert, MD      . acetaminophen (TYLENOL) tablet 650 mg  650 mg Oral Q4H PRN Quintella Reichert, MD   650 mg at 04/12/15 0716  . ALPRAZolam Prudy Feeler) tablet 0.25 mg  0.25 mg Oral TID PRN Quintella Reichert, MD   0.25 mg at 04/12/15 0905  . aspirin EC tablet 81 mg  81 mg Oral Daily Quintella Reichert, MD   81 mg at 04/12/15 1008  . buPROPion (WELLBUTRIN SR) 12 hr tablet 150 mg  150 mg Oral BID Quintella Reichert, MD   150 mg at 04/12/15 1007  . clopidogrel (PLAVIX) tablet 75 mg  75 mg Oral Q breakfast Quintella Reichert, MD   75 mg at 04/12/15 0716  . diltiazem (CARDIZEM LA) 24 hr tablet 120 mg  120 mg Oral Daily Quintella Reichert, MD   120 mg at 04/12/15 1007  . isosorbide mononitrate (IMDUR) 24 hr tablet 30 mg  30 mg Oral Daily Quintella Reichert, MD   30 mg at 04/12/15 1008  . morphine 2 MG/ML injection 2 mg  2 mg Intravenous Q1H PRN Kathleene Hazel, MD      . nitroGLYCERIN  (NITROSTAT) SL tablet 0.4 mg  0.4 mg Sublingual Q5 min PRN Quintella Reichert, MD      . ondansetron (ZOFRAN) injection 4 mg  4 mg Intravenous Q6H PRN Quintella Reichert, MD   4 mg at 04/12/15 0905  . oxyCODONE-acetaminophen (PERCOCET/ROXICET) 5-325 MG per tablet 1-2 tablet  1-2 tablet Oral Q4H PRN Kathleene Hazel, MD   2 tablet at 04/12/15 0905  . pravastatin (PRAVACHOL) tablet 40 mg  40 mg Oral q1800 Quintella Reichert, MD   40 mg at 04/11/15 1717  . sodium chloride 0.9 % injection 3 mL  3 mL Intravenous Q12H Quintella Reichert, MD   3 mL at 04/12/15 0905  . sodium chloride 0.9 % injection 3 mL  3 mL Intravenous PRN Quintella Reichert, MD        PE: General appearance: alert, cooperative and no distress Neck: no carotid bruit and no JVD Lungs: clear to auscultation bilaterally Heart: regular rate and rhythm, S1, S2 normal, no murmur, click, rub or gallop Extremities: no LEE Pulses: 2+ and symmetric Skin: warm and dry Neurologic: Grossly normal  Lab Results:   Recent Labs  04/11/15 0910 04/11/15 1654 04/12/15 0341  WBC 4.9 4.1  4.7  HGB 11.9* 11.7* 10.9*  HCT 34.5* 33.9* 32.2*  PLT 182 196 184   BMET  Recent Labs  04/11/15 0910 04/11/15 1654 04/12/15 0341  NA 141 137 139  K 3.9 3.4* 3.9  CL 111 106 106  CO2 GLUCOSE 92 133* 92  BUN CREATININE 0.66 0.72 0.72  CALCIUM 8.5 8.6 8.8   PT/INR  Recent Labs  04/11/15 1654  LABPROT 14.4  INR 1.10   Cardiac Panel (last 3 results) No results for input(s): CKTOTAL, CKMB, TROPONINI, RELINDX in the last 72 hours.  Studies/Results: LHC 04/11/15  Hemodynamic Findings: Central aortic pressure: 132/72 Left ventricular pressure: 127/2/20  Angiographic Findings:  Left main: Normal caliber vessel that divides into the LAD and left circumflex.. Ostial 20% stenosis.   Left Anterior Descending Artery: Large caliber vessel that courses to the apex. There is a focal 20% mid stenosis at the takeoff of the first diagonal  branch and a 30-40% stenosis in the mid LAD at the takeoff of the septal perforating branch. The first diagonal branch is a moderate caliber vessel with ostial 30% stenosis and a patent stent in the mid vessel with no restenosis.   Circumflex Artery: Moderate caliber vessel with small caliber obtuse marginal branch. The proximal vessel has diffuse 50% stenosis. The mid AV groove Circumflex has a 50% stenosis beyond the bifurcation of the first obtuse marginal branch. The first obtuse marginal branch has an ostial 50% stenosis. Of note, the vessel was noted to have diffuse vasospasm throughout that responded to IC NTG. The disease in the is vessel is unchanged from last cath and does not appear to be flow limiting.   Right Coronary Artery: Moderate caliber dominant vessel. The mid RCA has 40% stenosis. The distal vessel has a focal 30% stenosis. The PDA and PLA branches are patent. 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   Left Ventricular Angiogram: LVEF=65%.    Assessment/Plan  Active Problems:   NSTEMI (non-ST elevated myocardial infarction)   CAD (coronary artery disease)   Cocaine abuse   Hyperlipemia   Chest pain   Unstable angina   1. CAD: s/p recent NSTEMI 03/20/15 treated with BMS to severe diagonal stenosis. Re-look cath yesterday showed patent stent in the diagonal, moderate disease in the Circumflex that is unchanged from the prior cath 3 weeks ago. Coronary vasospasm was noted during the catheterization. Continuation of CCB and LA Nitrate recommended, on 120 mg of cardizem and 30 mg of Imdur. BP remains stable. Also continue DPAT with ASA + Plavix given recent BMS. Continue statin therapy with pravastatin. EF is normal at 65%.  2. Post LHC: Renal function is WNL.  Right radial access site is stable.   3. Chronic Pain: Patient notes h/o chronic LBP and long h/o migraine HA. She is requesting  refill of pain and anxiety meds. Does not have a PCP. Rx will need to be approved by Dr. Excell Seltzer. Patient understands if not approved by MD, Rx will not be written. Her CCB may help reduce recurrence/frequency of HA.   4. Dispo: home today with OP f/u.    LOS: 1 day    Christina Morrison 04/12/2015 10:15 AM  Patient seen, examined. Available data reviewed. Agree with findings, assessment, and plan as outlined by Christina Lis, PA-C. Patient independently interviewed and examined. Heart is regular rate and rhythm.  Lungs are clear. There is no peripheral edema. Right radial site is clear. Cardiac catheterization results reviewed. The patient is stable for discharge. I have reviewed her medical therapy which should be continued to include isosorbide and Cardizem. She has an appointment with primary care Monday, May 2. I am okay to right her when necessary oxycodone for her migraine headache and back pain until she sees primary care. Would write Percocet 5/325 mg, 1-2 every 6 hours as needed for pain, #20, 0 refills. A prescription for alprazolam was called in 4/25 per nursing note. Will not recommend any further alprazolam. All benzo and narcotics from here on out will need to go through primary care.  Tonny BollmanMichael Robynne Roat, M.D. 04/12/2015 1:08 PM

## 2015-04-12 NOTE — Discharge Summary (Signed)
Physician Discharge Summary  Patient ID: Christina Morrison MRN: [id number] DOB/AGE: September 11, 1976 39 y.o.   Primary Cardiologist: Dr. Excell Seltzer  Admit date: 04/11/2015 Discharge date: 04/12/2015  Admission Diagnoses: Chest Pain with Moderate Risk For Cardiac Etiology  Discharge Diagnoses:  Active Problems:   CAD (coronary artery disease)   Cocaine abuse   Hyperlipemia   Chest pain   Unstable angina   Discharged Condition: stable  Hospital Course: the patient is a 40 y/o female with a PMH that includes CAD, h/o cocaine abuse and HLD. She is followed by Dr. Excell Seltzer. She is status post recent non-ST segment elevation myocardial infarction in the setting of cocaine usage 03/20/15. She was admitted to [facility] and underwent diagnostic cardiac catheterization revealing severe proximal first diagonal disease. This was successfully stented using a vision bare-metal stent. She tolerated the procedure well and was discharged home in good condition. She was seen in clinic for post-hospital f/u on 03/27/15 and was stable w/o chest pain.  She presented back to [facility], 3 weeks after her MI, on 04/12/15 with complaints of recurrent CP. She noted resting substernal chest pressure/tightness very similar to her angina associated with her recent MI. She denied recurrent cocaine use. EKG on arrival was nonischemic and initial troponin was normal.However, given her recent history and symptomatolgy, the decision was made to take back to the cath lab for a re-look to ensure patency of previously placed stent. The procedure was performed by Dr. Clifton James via the right radial artery. She was found to have double vessel CAD with patent stent in the diagonal, moderate disease in the Circumflex that is unchanged from the prior cath 3 weeks ago. EF was normal at 65%. Coronary vasospasm was noted during the catheterization. Continuation of CCB and LA Nitrate recommended. She was continued on 120 mg of cardizem and 30 mg of Imdur. DAPT  with ASA and Plavix as well as her statin were continued. She tolerated the procedure well and left the cath lab in stable condition. She had no post cath complications and no further pain. She was last seen and examined by Dr. Excell Seltzer who determined she was stable for discharge. F/U arranged with Dr. Excell Seltzer.     Consults: None  Significant Diagnostic Studies:   LHC 04/11/15 Hemodynamic Findings: Central aortic pressure: 132/72 Left ventricular pressure: 127/2/20  Angiographic Findings:  Left main: Normal caliber vessel that divides into the LAD and left circumflex.. Ostial 20% stenosis.   Left Anterior Descending Artery: Large caliber vessel that courses to the apex. There is a focal 20% mid stenosis at the takeoff of the first diagonal branch and a 30-40% stenosis in the mid LAD at the takeoff of the septal perforating branch. The first diagonal branch is a moderate caliber vessel with ostial 30% stenosis and a patent stent in the mid vessel with no restenosis.   Circumflex Artery: Moderate caliber vessel with small caliber obtuse marginal branch. The proximal vessel has diffuse 50% stenosis. The mid AV groove Circumflex has a 50% stenosis beyond the bifurcation of the first obtuse marginal branch. The first obtuse marginal branch has an ostial 50% stenosis. Of note, the vessel was noted to have diffuse vasospasm throughout that responded to IC NTG. The disease in the is vessel is unchanged from last cath and does not appear to be flow limiting.   Right Coronary Artery: Moderate caliber dominant vessel. The mid RCA has 40% stenosis. The distal vessel has a focal 30% stenosis. The PDA and PLA branches are patent.  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   Left Ventricular Angiogram: LVEF=65%.    Treatments: See Hospital Course  Discharge Exam: Blood pressure 105/68, pulse 75, temperature 97.9 F (36.6  C), temperature source Oral, resp. rate 20, last menstrual period 03/26/2015, SpO2 100 %.   Disposition: 01-Home or Self Care     Medication List    TAKE these medications        ALPRAZolam 0.25 MG tablet  Commonly known as:  XANAX  Take 1 tablet (0.25 mg total) by mouth 3 (three) times daily as needed for anxiety.     aspirin 81 MG EC tablet  Take 1 tablet (81 mg total) by mouth daily.     buPROPion 150 MG 12 hr tablet  Commonly known as:  WELLBUTRIN SR  Take 1 tablet (150 mg total) by mouth 2 (two) times daily.     clopidogrel 75 MG tablet  Commonly known as:  PLAVIX  Take 1 tablet (75 mg total) by mouth daily with breakfast.     diltiazem 120 MG 24 hr tablet  Commonly known as:  CARDIZEM LA  Take 1 tablet (120 mg total) by mouth daily.     fluticasone 50 MCG/ACT nasal spray  Commonly known as:  FLONASE  Place 2 sprays into both nostrils daily as needed for allergies or rhinitis.     isosorbide mononitrate 30 MG 24 hr tablet  Commonly known as:  IMDUR  Take 1 tablet (30 mg total) by mouth daily.     nicotine 14 mg/24hr patch  Commonly known as:  NICODERM CQ  Place 1 patch (14 mg total) onto the skin daily.     nitroGLYCERIN 0.4 MG SL tablet  Commonly known as:  NITROSTAT  Place 1 tablet (0.4 mg total) under the tongue every 5 (five) minutes as needed for chest pain.     oxyCODONE-acetaminophen 5-325 MG per tablet  Commonly known as:  PERCOCET/ROXICET  Take 1-2 tablets by mouth every 6 (six) hours as needed for severe pain (Back and knee pain).     pravastatin 40 MG tablet  Commonly known as:  PRAVACHOL  Take 1 tablet (40 mg total) by mouth daily.       TIME SPENT ON DISCHARGE, INCLUDING PHYSICIAN TIME: >30 MINUTES  Signed: SIMMONS, BRITTAINY 04/12/2015, 1:21 PM

## 2015-04-12 NOTE — Care Management Note (Signed)
    Page 1 of 1   04/12/2015     2:40:29 PM CARE MANAGEMENT NOTE 04/12/2015  Patient:  Christina Morrison,Christina Morrison   Account Number:  [id number]  Date Initiated:  04/12/2015  Documentation initiated by:  Donn PieriniWEBSTER,Ronalee Scheunemann  Subjective/Objective Assessment:   Pt admitted with c/p     Action/Plan:   PTA Pt  lived at home   Anticipated DC Date:  04/12/2015   Anticipated DC Plan:  HOME/SELF CARE  In-house referral  Financial Counselor      DC Planning Services  CM consult      Choice offered to / List presented to:             Status of service:  Completed, signed off Medicare Important Message given?   (If response is "NO", the following Medicare IM given date fields will be blank) Date Medicare IM given:   Medicare IM given by:   Date Additional Medicare IM given:   Additional Medicare IM given by:    Discharge Disposition:  HOME/SELF CARE  Per UR Regulation:  Reviewed for med. necessity/level of care/duration of stay  If discussed at Long Length of Stay Meetings, dates discussed:    Comments:  04/12/15- 1230- Donn PieriniKristi Cariana Karge RN, BSN [phone] Requested to see pt regarding potential medication needs- spoke with pt at bedside- who states that she does not need medication assistance but rather info on medicaid application process- [facility]- who has already spoken with pt and spouse- referred them to DSS to apply for Medicaid- [facility]- and was reminded to go to DSS to apply. pt [facility]- she also has an upcoming appointment with [facility]. No further CM needs identified.

## 2015-04-12 NOTE — Progress Notes (Signed)
UR Completed. Diontae Route, RN, BSN.  [phone] 

## 2015-04-25 ENCOUNTER — Ambulatory Visit (INDEPENDENT_AMBULATORY_CARE_PROVIDER_SITE_OTHER): Payer: Medicaid Other | Admitting: Obstetrics & Gynecology

## 2015-04-25 ENCOUNTER — Encounter: Payer: Self-pay | Admitting: Obstetrics & Gynecology

## 2015-04-25 VITALS — BP 84/64 | HR 88 | Temp 97.9°F | Wt 146.4 lb

## 2015-04-25 DIAGNOSIS — N760 Acute vaginitis: Secondary | ICD-10-CM

## 2015-04-25 DIAGNOSIS — Z30431 Encounter for routine checking of intrauterine contraceptive device: Secondary | ICD-10-CM | POA: Diagnosis present

## 2015-04-25 DIAGNOSIS — B9689 Other specified bacterial agents as the cause of diseases classified elsewhere: Secondary | ICD-10-CM

## 2015-04-25 DIAGNOSIS — B373 Candidiasis of vulva and vagina: Secondary | ICD-10-CM

## 2015-04-25 DIAGNOSIS — Z975 Presence of (intrauterine) contraceptive device: Secondary | ICD-10-CM

## 2015-04-25 DIAGNOSIS — A499 Bacterial infection, unspecified: Secondary | ICD-10-CM

## 2015-04-25 DIAGNOSIS — B3731 Acute candidiasis of vulva and vagina: Secondary | ICD-10-CM

## 2015-04-25 MED ORDER — FLUCONAZOLE 150 MG PO TABS: 150.0000 mg | ORAL_TABLET | Freq: Once | ORAL | Status: DC

## 2015-04-25 NOTE — Progress Notes (Signed)
CLINIC ENCOUNTER NOTE  History:  39 y.o. [id number] here today for today for IUD string check; Paragard IUD was placed 04/03/15. No complaints about the IUD, no concerning side effects. Just reports occasional spotting which is a normal and expected side-effect.  Also reported vulvar itching for a few days, recently took antibiotics.   Past Medical History  Diagnosis Date  . Depression   . Anxiety   . Bipolar 1 disorder   . Scoliosis   . Migraine   . Osteoarthritis   . Coronary artery disease     a. 03/2015 NSTEMI/PCI: LM 20ost, LAD 30-40p/m, D1 40-50ost, 90p (2.75x12 Vision BMS), LCX 50p/m, RCA 40-3066m, EF 65-70% (UDS +for cocaine).  [name]. TIA (transient ischemic attack)   . Polysubstance abuse     a. tobacco/cocaine    Past Surgical History  Procedure Laterality Date  . Coronary stent placement  03/21/2015    first diagonal  . Left heart catheterization with coronary angiogram N/A 03/21/2015    Procedure: LEFT HEART CATHETERIZATION WITH CORONARY ANGIOGRAM;  Surgeon: Tonny BollmanMichael Cooper, MD;  [facility];  Service: Cardiovascular;  Laterality: N/A;  . Cardiac catheterization  03/21/2015    Procedure: CORONARY STENT INTERVENTION;  Surgeon: Tonny BollmanMichael Cooper, MD;  [facility];  Service: Cardiovascular;;  diag bms 2.75 x 12 vision  . Left heart catheterization with coronary angiogram N/A 04/11/2015    Procedure: LEFT HEART CATHETERIZATION WITH CORONARY ANGIOGRAM;  Surgeon: Kathleene Hazelhristopher D McAlhany, MD;  [facility];  Service: Cardiovascular;  Laterality: N/A;    The following portions of the patient's history were reviewed and updated as appropriate: allergies, current medications, past family history, past medical history, past social history, past surgical history and problem list.   Health Maintenance:  Normal pap and negative HRHPV on 04/03/2015.  Review of Systems:  Pertinent items are noted in HPI. Comprehensive review of systems was otherwise  negative.  Objective:  Physical Exam BP 84/64 mmHg  Pulse 88  Temp(Src) 97.9 F (36.6 C)  Wt 146 lb 6.4 oz (66.407 kg)  LMP 03/26/2015 CONSTITUTIONAL: Well-developed, well-nourished female in no acute distress.  HENT: Normocephalic, atraumatic, External right and left ear normal. Oropharynx is clear and moist EYES: Conjunctivae and EOM are normal. Pupils are equal, round, and reactive to light. No scleral icterus.  NECK: Normal range of motion, supple, no masses. Normal thyroid.  SKIN: Skin is warm and dry. No rash noted. Not diaphoretic. No erythema. No pallor. NEUROLGIC: Alert and oriented to person, place, and time. Normal reflexes, muscle tone coordination. No cranial nerve deficit noted. PSYCHIATRIC: Normal mood and affect. Normal behavior. Normal judgment and thought content. CARDIOVASCULAR: Normal heart rate noted, regular rhythm RESPIRATORY: Clear to auscultation bilaterally. Effort and breath sounds normal, no problems with respiration noted. ABDOMEN: Soft, normal bowel sounds, no distention noted. No tenderness, rebound or guarding.  PELVIC: Normal appearing external genitalia; normal appearing vaginal mucosa and cervix. IUD strings visualized, about 3 cm in length outside cervix. Scant blood noted in vaginal vault. Wet prep obtained MUSCULOSKELETAL: Normal range of motion. No tenderness. No cyanosis, clubbing, or edema. 2+ distal pulses.   Assessment & Plan:  Normal IUD check. Patient to keep IUD in place for up to ten years; can come in for removal if she desires pregnancy within the next five years. Diflucan presumptively prescribed for yeast infection; will follow up wet prep results and manage accordingly. Routine preventative health maintenance measures emphasized.   Jaynie CollinsUGONNA Danna Sewell, MD, FACOG Attending Obstetrician &  Therapist, nutritionalGynecologist Center for [facility], [facility]

## 2015-04-25 NOTE — Patient Instructions (Signed)
Return to clinic for any scheduled appointments or for any gynecologic concerns as needed.   

## 2015-04-25 NOTE — Progress Notes (Signed)
Patient ID: Christina Morrison, female   DOB: 1976-02-28, 39 y.o.   MRN: [id number] Pt feels like she is getting a yeast infection.  Pt recently had a heart attack, stent placed.

## 2015-04-26 ENCOUNTER — Telehealth: Payer: Self-pay | Admitting: *Deleted

## 2015-04-26 LAB — WET PREP, GENITAL
TRICH WET PREP: NONE SEEN
Yeast Wet Prep HPF POC: NONE SEEN

## 2015-04-26 MED ORDER — METRONIDAZOLE 500 MG PO TABS: 500.0000 mg | ORAL_TABLET | Freq: Two times a day (BID) | ORAL | Status: DC

## 2015-04-26 NOTE — Telephone Encounter (Addendum)
-----   Message from Tereso NewcomerUgonna A Anyanwu, MD sent at 04/26/2015 11:49 AM EDT ----- Mellody DrownWet prep showed clue cells consistent with BV, Metronidazole e-prescribed. Please call to inform patient of results and advise her to pick up prescription.  5/12  1215  Called pt and informed her of test results and treatment required.  After answering her questions, she voiced understanding.   Kahleah Crass RNC

## 2015-04-26 NOTE — Addendum Note (Signed)
Addended by: Jaynie CollinsANYANWU, UGONNA A on: 04/26/2015 11:43 AM   Modules accepted: Orders

## 2015-04-27 ENCOUNTER — Telehealth: Payer: Self-pay | Admitting: Cardiovascular Disease

## 2015-04-27 MED ORDER — ISOSORBIDE MONONITRATE ER 60 MG PO TB24: 60.0000 mg | ORAL_TABLET | Freq: Every day | ORAL | Status: DC

## 2015-04-27 NOTE — Telephone Encounter (Signed)
New message    Patient calling   Patient calling   Pt c/o of Chest Pain: STAT if CP now or developed within 24 hours  1. Are you having CP right now? No   2. Are you experiencing any other symptoms (ex. SOB, nausea, vomiting, sweating)? No   3. How long have you been experiencing CP? 3 night . Never during the day   4. Is your CP continuous or coming and going? Comes and goes   5. Have you taken Nitroglycerin? 5/11 0- 4 pills /  5/12- 4 pills every 2 hrs /  Last night - 1 pills  ?

## 2015-04-27 NOTE — Telephone Encounter (Signed)
Spoke with pt , she states has been having chest pain, the pain starts in her arms then chest pressure to neck and jaw pain. The pain is like when she had the MI. Pt had a stent placement on 03/20/15. Pt was sen in the ED with C/O of chest pain on 04/12/15.  Pt states the chest pain occurs during the night; it wakes her up. This has been occuring  for the last 3 days. On 5/11 and 5/12 pt took NTG every two house for a total of 4 tablets SL each night. Last night she did not have chest pain until this morning at 5:00 AM pt took one NTG SL and a baby aspirin. Pt denies any pain now. Pt denies using cocaine. Pt states when she went to the ER on 04/11/15, labs was done for cocaine used it was negative.

## 2015-04-27 NOTE — Telephone Encounter (Signed)
Ward Givenshris Berge NP aware of pt's symptoms, and recommends for pt to take an extra 60 mg of Imdur today and increased the Imdur dose to 60 mg once a day starting tomorrow. Pt is aware that if the pain does not go away by the weekend she is to go to the ER. Pt verbalized understanding. [facility] Im[facility] 60 [facility] . Pt is aware.

## 2015-05-18 ENCOUNTER — Telehealth: Payer: Self-pay | Admitting: Cardiovascular Disease

## 2015-05-18 MED ORDER — RANOLAZINE ER 500 MG PO TB12: 500.0000 mg | ORAL_TABLET | Freq: Two times a day (BID) | ORAL | Status: DC

## 2015-05-18 NOTE — Telephone Encounter (Signed)
New message      Pt c/o medication issue:  1. Name of Medication: isosorbide 2. How are you currently taking this medication (dosage and times per day)? 60mg  daily 3. Are you having a reaction (difficulty breathing--STAT)?  no  4. What is your medication issue? Dr Excell Seltzerooper increased rx from 30mg  to 60mg  daily.  When she is asleep, she gets chest tightness, throat tightness, etc.  Sometimes it will stop on its own or she will have to take a nitro.  She is ok now, only when she is asleep. Please advise

## 2015-05-18 NOTE — Telephone Encounter (Signed)
I spoke with the pt and she states that about every 2 weeks since her MI she starts to develop symptoms similar to her heart attack.  The pt did increase her Imdur to 60mg  daily as instructed on 04/27/15 and for 1 week she did not have any symptoms.  Gradually over the past week the pt has started to develop chest and throat tightness and arm heaviness during the night.  Initially her symptoms were waking her from sleep 1-2 times a night but now the pt is waking up 3 times a night. She takes 1 NTG and this relieves her symptoms and she is able to go back to sleep for about 2 hours before her symptoms awaken her again.  During the day the pt only notices SOB when walking.  The pt checked her vitals while on the phone and BP was 101/38, pulse 89.

## 2015-05-18 NOTE — Telephone Encounter (Signed)
Discussed pt with Dr Excell Seltzerooper and he recommended that the pt start Ranexa 500mg  take one by mouth twice a day and STOP smoking. If the pt's symptoms change or worsen over the weekend then he would like the pt to go to the ER.  I spoke with the pt and made her aware of medication and Rx sent to pharmacy.  The pt said she is only smoking a few times per day and we discussed the benefits of smoking cessation.  The pt agreed to seek treatment in the ER if she had further worsening of symptoms.

## 2015-05-19 ENCOUNTER — Other Ambulatory Visit: Payer: Self-pay | Admitting: Cardiology

## 2015-05-19 MED ORDER — RANOLAZINE ER 500 MG PO TB12: 500.0000 mg | ORAL_TABLET | Freq: Two times a day (BID) | ORAL | Status: DC

## 2015-05-19 NOTE — Progress Notes (Signed)
  Patient called reporting that Ranexa was sent to the wrong pharmacy. 500 mg BID was e-prescribed to [facility] [place].  Christina Morrison

## 2015-05-21 ENCOUNTER — Telehealth: Payer: Self-pay | Admitting: Cardiovascular Disease

## 2015-05-21 NOTE — Telephone Encounter (Signed)
New Message       Pt calling stating that a medication that was called in for her on Friday is too expensive and she wants to know if there is a generic and she thinks she need medication for acid reflux. Please call back and advise.

## 2015-05-21 NOTE — Telephone Encounter (Signed)
I spoke with the pt and she was unable to pick up Ranexa over the weekend due to cost.  The pt is working with the [facility] pharmacy to see if they can get the pt's medication at a lower cost.  The pt said she took her mother in laws reflux medicine (unsure of name) last night and she did not have any symptoms during the night.  I made the pt aware that I have placed a 4 week supply of Ranexa at the front desk and she will pick this up in the morning.  I asked her to hold off on taking any OTC reflux medicine and see if her symptoms improved after being on Ranexa for 1 week.  The pt agreed with plan and will touch base with our office next week.

## 2015-06-04 ENCOUNTER — Telehealth: Payer: Self-pay | Admitting: Cardiovascular Disease

## 2015-06-04 NOTE — Telephone Encounter (Signed)
New Message        Pt calling stating that Renexa does work but she needs to see about getting help in regards to paying for the medication. Please call back and advise.

## 2015-06-04 NOTE — Telephone Encounter (Signed)
Paperwork printed out for [employer] Patient Assistance Program.  Dr Excell Seltzer completed the physician portion of these forms. I spoke with the pt and placed them at the front desk for pick-up.  Currently we do not have any samples of this medication available.

## 2015-06-09 ENCOUNTER — Emergency Department (HOSPITAL_COMMUNITY): Payer: Medicaid Other

## 2015-06-09 ENCOUNTER — Encounter (HOSPITAL_COMMUNITY): Payer: Self-pay | Admitting: Emergency Medicine

## 2015-06-09 ENCOUNTER — Observation Stay (HOSPITAL_COMMUNITY)
Admission: EM | Admit: 2015-06-09 | Discharge: 2015-06-11 | Disposition: A | Discharge status: Home / Self Care | Payer: Medicaid Other | Attending: Internal Medicine | Admitting: Internal Medicine

## 2015-06-09 DIAGNOSIS — K7460 Unspecified cirrhosis of liver: Secondary | ICD-10-CM | POA: Diagnosis not present

## 2015-06-09 DIAGNOSIS — F418 Other specified anxiety disorders: Secondary | ICD-10-CM

## 2015-06-09 DIAGNOSIS — K449 Diaphragmatic hernia without obstruction or gangrene: Secondary | ICD-10-CM | POA: Insufficient documentation

## 2015-06-09 DIAGNOSIS — I2510 Atherosclerotic heart disease of native coronary artery without angina pectoris: Secondary | ICD-10-CM | POA: Diagnosis not present

## 2015-06-09 DIAGNOSIS — Z8673 Personal history of transient ischemic attack (TIA), and cerebral infarction without residual deficits: Secondary | ICD-10-CM | POA: Insufficient documentation

## 2015-06-09 DIAGNOSIS — R079 Chest pain, unspecified: Secondary | ICD-10-CM | POA: Diagnosis present

## 2015-06-09 DIAGNOSIS — R1011 Right upper quadrant pain: Secondary | ICD-10-CM

## 2015-06-09 DIAGNOSIS — F17210 Nicotine dependence, cigarettes, uncomplicated: Secondary | ICD-10-CM | POA: Insufficient documentation

## 2015-06-09 DIAGNOSIS — Z7982 Long term (current) use of aspirin: Secondary | ICD-10-CM | POA: Insufficient documentation

## 2015-06-09 DIAGNOSIS — E785 Hyperlipidemia, unspecified: Secondary | ICD-10-CM | POA: Diagnosis not present

## 2015-06-09 DIAGNOSIS — Z833 Family history of diabetes mellitus: Secondary | ICD-10-CM

## 2015-06-09 DIAGNOSIS — Z955 Presence of coronary angioplasty implant and graft: Secondary | ICD-10-CM | POA: Insufficient documentation

## 2015-06-09 DIAGNOSIS — Z7902 Long term (current) use of antithrombotics/antiplatelets: Secondary | ICD-10-CM | POA: Diagnosis not present

## 2015-06-09 DIAGNOSIS — F419 Anxiety disorder, unspecified: Secondary | ICD-10-CM | POA: Diagnosis not present

## 2015-06-09 DIAGNOSIS — I252 Old myocardial infarction: Secondary | ICD-10-CM | POA: Diagnosis not present

## 2015-06-09 DIAGNOSIS — K2970 Gastritis, unspecified, without bleeding: Secondary | ICD-10-CM | POA: Diagnosis not present

## 2015-06-09 DIAGNOSIS — F329 Major depressive disorder, single episode, unspecified: Secondary | ICD-10-CM | POA: Diagnosis present

## 2015-06-09 DIAGNOSIS — F17200 Nicotine dependence, unspecified, uncomplicated: Secondary | ICD-10-CM | POA: Diagnosis present

## 2015-06-09 DIAGNOSIS — R110 Nausea: Secondary | ICD-10-CM

## 2015-06-09 DIAGNOSIS — K219 Gastro-esophageal reflux disease without esophagitis: Secondary | ICD-10-CM | POA: Diagnosis not present

## 2015-06-09 DIAGNOSIS — F32A Depression, unspecified: Secondary | ICD-10-CM | POA: Diagnosis present

## 2015-06-09 DIAGNOSIS — Z9861 Coronary angioplasty status: Secondary | ICD-10-CM

## 2015-06-09 LAB — BASIC METABOLIC PANEL
ANION GAP: 9 (ref 5–15)
BUN: 9 mg/dL (ref 6–20)
CO2: 24 mmol/L (ref 22–32)
CREATININE: 0.77 mg/dL (ref 0.44–1.00)
Calcium: 9 mg/dL (ref 8.9–10.3)
Chloride: 104 mmol/L (ref 101–111)
GLUCOSE: 86 mg/dL (ref 65–99)
Potassium: 4.4 mmol/L (ref 3.5–5.1)
SODIUM: 137 mmol/L (ref 135–145)

## 2015-06-09 LAB — RAPID URINE DRUG SCREEN, HOSP PERFORMED
Amphetamines: NOT DETECTED
BARBITURATES: NOT DETECTED
Benzodiazepines: POSITIVE — AB
Cocaine: NOT DETECTED
OPIATES: POSITIVE — AB
Tetrahydrocannabinol: NOT DETECTED

## 2015-06-09 LAB — CBC
HCT: 38.7 % (ref 36.0–46.0)
HEMOGLOBIN: 13.5 g/dL (ref 12.0–15.0)
MCH: 30.9 pg (ref 26.0–34.0)
MCHC: 34.9 g/dL (ref 30.0–36.0)
MCV: 88.6 fL (ref 78.0–100.0)
Platelets: 165 10*3/uL (ref 150–400)
RBC: 4.37 MIL/uL (ref 3.87–5.11)
RDW: 11.7 % (ref 11.5–15.5)
WBC: 4.9 10*3/uL (ref 4.0–10.5)

## 2015-06-09 LAB — I-STAT TROPONIN, ED: TROPONIN I, POC: 0.01 ng/mL (ref 0.00–0.08)

## 2015-06-09 LAB — POC URINE PREG, ED: Preg Test, Ur: NEGATIVE

## 2015-06-09 LAB — BRAIN NATRIURETIC PEPTIDE: B NATRIURETIC PEPTIDE 5: 32.4 pg/mL (ref 0.0–100.0)

## 2015-06-09 MED ORDER — PRAVASTATIN SODIUM 40 MG PO TABS
40.0000 mg | ORAL_TABLET | Freq: Every day | ORAL | Status: DC
  Administered 2015-06-10 – 2015-06-11 (×2): 40 mg via ORAL
  Filled 2015-06-09 (×2): qty 1

## 2015-06-09 MED ORDER — DILTIAZEM HCL ER COATED BEADS 120 MG PO TB24
120.0000 mg | ORAL_TABLET | Freq: Every day | ORAL | Status: DC
  Filled 2015-06-09 (×2): qty 1

## 2015-06-09 MED ORDER — ISOSORBIDE MONONITRATE ER 60 MG PO TB24: 60.0000 mg | ORAL_TABLET | Freq: Every day | ORAL | Status: DC

## 2015-06-09 MED ORDER — GI COCKTAIL ~~LOC~~
30.0000 mL | Freq: Once | ORAL | Status: AC
  Administered 2015-06-09: 30 mL via ORAL
  Filled 2015-06-09: qty 30

## 2015-06-09 MED ORDER — BUPROPION HCL ER (SR) 150 MG PO TB12
150.0000 mg | ORAL_TABLET | Freq: Every day | ORAL | Status: DC
  Administered 2015-06-10 (×2): 150 mg via ORAL
  Filled 2015-06-09 (×2): qty 1

## 2015-06-09 MED ORDER — ONDANSETRON HCL 4 MG/2ML IJ SOLN
4.0000 mg | Freq: Once | INTRAMUSCULAR | Status: AC
  Administered 2015-06-09: 4 mg via INTRAVENOUS
  Filled 2015-06-09: qty 2

## 2015-06-09 MED ORDER — FLUTICASONE PROPIONATE 50 MCG/ACT NA SUSP
2.0000 | Freq: Every day | NASAL | Status: DC | PRN
Start: 2015-06-09 — End: 2015-06-11
  Filled 2015-06-09: qty 16

## 2015-06-09 MED ORDER — HYDROMORPHONE HCL 1 MG/ML IJ SOLN
0.5000 mg | INTRAMUSCULAR | Status: DC | PRN
  Administered 2015-06-09 – 2015-06-10 (×7): 1 mg via INTRAVENOUS
  Filled 2015-06-09 (×8): qty 1

## 2015-06-09 MED ORDER — KETOROLAC TROMETHAMINE 30 MG/ML IJ SOLN
30.0000 mg | Freq: Once | INTRAMUSCULAR | Status: AC
Start: 2015-06-09 — End: 2015-06-09
  Administered 2015-06-09: 30 mg via INTRAVENOUS
  Filled 2015-06-09: qty 1

## 2015-06-09 MED ORDER — CLOPIDOGREL BISULFATE 75 MG PO TABS
75.0000 mg | ORAL_TABLET | Freq: Every day | ORAL | Status: DC
  Administered 2015-06-10 – 2015-06-11 (×2): 75 mg via ORAL
  Filled 2015-06-09 (×2): qty 1

## 2015-06-09 MED ORDER — MORPHINE SULFATE 4 MG/ML IJ SOLN
4.0000 mg | Freq: Once | INTRAMUSCULAR | Status: AC
  Administered 2015-06-09: 4 mg via INTRAVENOUS
  Filled 2015-06-09: qty 1

## 2015-06-09 MED ORDER — BUPROPION HCL ER (SR) 150 MG PO TB12
300.0000 mg | ORAL_TABLET | Freq: Every day | ORAL | Status: DC
  Administered 2015-06-10 – 2015-06-11 (×2): 300 mg via ORAL
  Filled 2015-06-09 (×2): qty 2

## 2015-06-09 MED ORDER — HEPARIN SODIUM (PORCINE) 5000 UNIT/ML IJ SOLN
5000.0000 [IU] | Freq: Three times a day (TID) | INTRAMUSCULAR | Status: DC
  Administered 2015-06-10 (×4): 5000 [IU] via SUBCUTANEOUS
  Filled 2015-06-09 (×2): qty 1

## 2015-06-09 MED ORDER — ONDANSETRON HCL 4 MG/2ML IJ SOLN
4.0000 mg | Freq: Four times a day (QID) | INTRAMUSCULAR | Status: DC | PRN
  Administered 2015-06-10: 4 mg via INTRAVENOUS
  Filled 2015-06-09: qty 2

## 2015-06-09 MED ORDER — NITROGLYCERIN IN D5W 200-5 MCG/ML-% IV SOLN
5.0000 ug/min | INTRAVENOUS | Status: DC
  Administered 2015-06-10: 10 ug/min via INTRAVENOUS
  Filled 2015-06-09: qty 250

## 2015-06-09 MED ORDER — ACETAMINOPHEN 325 MG PO TABS
650.0000 mg | ORAL_TABLET | ORAL | Status: DC | PRN
  Administered 2015-06-10: 650 mg via ORAL
  Filled 2015-06-09: qty 2

## 2015-06-09 MED ORDER — RANOLAZINE ER 500 MG PO TB12
500.0000 mg | ORAL_TABLET | Freq: Two times a day (BID) | ORAL | Status: DC
  Administered 2015-06-10 – 2015-06-11 (×4): 500 mg via ORAL
  Filled 2015-06-09 (×4): qty 1

## 2015-06-09 MED ORDER — BUPROPION HCL ER (SR) 150 MG PO TB12: 150.0000 mg | ORAL_TABLET | Freq: Two times a day (BID) | ORAL | Status: DC

## 2015-06-09 MED ORDER — SODIUM CHLORIDE 0.9 % IV SOLN: INTRAVENOUS | Status: DC

## 2015-06-09 MED ORDER — ALPRAZOLAM 0.25 MG PO TABS
0.2500 mg | ORAL_TABLET | Freq: Three times a day (TID) | ORAL | Status: DC | PRN
  Administered 2015-06-10 (×2): 0.25 mg via ORAL
  Filled 2015-06-09 (×2): qty 1

## 2015-06-09 MED ORDER — ASPIRIN EC 81 MG PO TBEC
81.0000 mg | DELAYED_RELEASE_TABLET | Freq: Every day | ORAL | Status: DC
  Administered 2015-06-10 – 2015-06-11 (×2): 81 mg via ORAL
  Filled 2015-06-09 (×2): qty 1

## 2015-06-09 NOTE — ED Notes (Signed)
Pt is from home, pt presents with chest pain that started at 0200 this morning, pt reports she took a nitroglycerin at 0200 and 0300 and had some relief, was able to rest. Pt reports intermittent pain throughout the day, concerned because it keeps coming back. Hx of an MI in April. EMS adm 324 aspirin.

## 2015-06-09 NOTE — ED Provider Notes (Signed)
CSN: [id number]     Arrival date & time 06/09/15  1956 History   First MD Initiated Contact with Patient 06/09/15 1957     Chief Complaint  Patient presents with  . Chest Pain     (Consider location/radiation/quality/duration/timing/severity/associated sxs/prior Treatment) HPI Comments: Patient is a 39 year old female with a past medical history of CAD, previous TIA, bipolar 1 disorder and previous MI in April 2016 who presents with chest pain that started around 2am today. Symptoms started sudden and woke her from sleep. The chest pain is located in her central chest and radiates to her left jaw. The pain is a severe squeezing and pressure. She reports associated nausea and SOB. The pain has been intermittent since the onset and lasting between 20 min and 1 hour. Patient took nitro when the pain first started which seemed to alleviate the pain. She received 324mg  ASA en route via EMS.    Past Medical History  Diagnosis Date  . Depression   . Anxiety   . Bipolar 1 disorder   . Scoliosis   . Migraine   . Osteoarthritis   . Coronary artery disease     a. 03/2015 NSTEMI/PCI: LM 20ost, LAD 30-40p/m, D1 40-50ost, 90p (2.75x12 Vision BMS), LCX 50p/m, RCA 40-15m, EF 65-70% (UDS +for cocaine).  [name] TIA (transient ischemic attack)   . Polysubstance abuse     a. tobacco/cocaine   Past Surgical History  Procedure Laterality Date  . Coronary stent placement  03/21/2015    first diagonal  . Left heart catheterization with coronary angiogram N/A 03/21/2015    Procedure: LEFT HEART CATHETERIZATION WITH CORONARY ANGIOGRAM;  Surgeon: [name], MD;  Location: [facility] CATH LAB;  Service: Cardiovascular;  Laterality: N/A;  . Cardiac catheterization  03/21/2015    Procedure: CORONARY STENT INTERVENTION;  Surgeon: [name], MD;  Location: [facility] CATH LAB;  Service: Cardiovascular;;  diag bms 2.75 x 12 vision  . Left heart catheterization with coronary angiogram N/A 04/11/2015    Procedure: LEFT HEART  CATHETERIZATION WITH CORONARY ANGIOGRAM;  Surgeon: Kathleene Hazel, MD;  Location: [facility] CATH LAB;  Service: Cardiovascular;  Laterality: N/A;   Family History  Problem Relation Age of Onset  . Mental retardation Mother   . Hypertension Mother   . Hyperlipidemia Mother   . Heart disease Mother   . Depression Mother   . Hypertension Father   . Hyperlipidemia Father   . Heart disease Father   . Mental retardation Father   . Diabetes Father   . Cancer Maternal Aunt   . Stroke Maternal Grandmother    History  Substance Use Topics  . Smoking status: Current Every Day Smoker -- 1.00 packs/day    Types: Cigarettes  . Smokeless tobacco: Never Used  . Alcohol Use: No     Comment: once a month   OB History    Gravida Para Term Preterm AB TAB SAB Ectopic Multiple Living   2 2 2  0 0 0 0 0 0 2     Review of Systems  Constitutional: Negative for fever, chills and fatigue.  HENT: Negative for trouble swallowing.   Eyes: Negative for visual disturbance.  Respiratory: Negative for shortness of breath.   Cardiovascular: Positive for chest pain. Negative for palpitations.  Gastrointestinal: Positive for nausea. Negative for vomiting, abdominal pain and diarrhea.  Genitourinary: Negative for dysuria and difficulty urinating.  Musculoskeletal: Negative for arthralgias and neck pain.  Skin: Negative for color change.  Neurological: Negative for dizziness  and weakness.  Psychiatric/Behavioral: Negative for dysphoric mood.      Allergies  Review of patient's allergies indicates no known allergies.  Home Medications   Prior to Admission medications   Medication Sig Start Date End Date Taking? Authorizing Provider  ALPRAZolam (XANAX) 0.25 MG tablet Take 1 tablet (0.25 mg total) by mouth 3 (three) times daily as needed for anxiety. 04/09/15   Ok Anis, NP  aspirin EC 81 MG EC tablet Take 1 tablet (81 mg total) by mouth daily. 03/23/15   Ok Anis, NP  buPROPion  [facility] SR) 150 MG 12 hr tablet Take 1 tablet (150 mg total) by mouth 2 (two) times daily. 03/27/15   Ok Anis, NP  clopidogrel (PLAVIX) 75 MG tablet Take 1 tablet (75 mg total) by mouth daily with breakfast. 03/23/15   Ok Anis, NP  diltiazem (CARDIZEM LA) 120 MG 24 hr tablet Take 1 tablet (120 mg total) by mouth daily. 03/23/15   Ok Anis, NP  fluconazole (DIFLUCAN) 150 MG tablet Take 1 tablet (150 mg total) by mouth once. Can take additional dose three days later if symptoms persist 04/25/15   Tereso Newcomer, MD  fluticasone (FLONASE) 50 MCG/ACT nasal spray Place 2 sprays into both nostrils daily as needed for allergies or rhinitis.    Historical Provider, MD  isosorbide mononitrate (IMDUR) 60 MG 24 hr tablet Take 1 tablet (60 mg total) by mouth daily. 04/27/15   Ok Anis, NP  metroNIDAZOLE (FLAGYL) 500 MG tablet Take 1 tablet (500 mg total) by mouth 2 (two) times daily. 04/26/15   Tereso Newcomer, MD  nitroGLYCERIN (NITROSTAT) 0.4 MG SL tablet Place 1 tablet (0.4 mg total) under the tongue every 5 (five) minutes as needed for chest pain. 03/23/15   Ok Anis, NP  oxyCODONE-acetaminophen (PERCOCET/ROXICET) 5-325 MG per tablet Take 1-2 tablets by mouth every 6 (six) hours as needed for severe pain (Back and knee pain). 04/12/15   Brittainy Sherlynn Carbon, PA-C  pravastatin (PRAVACHOL) 40 MG tablet Take 1 tablet (40 mg total) by mouth daily. 03/23/15   Ok Anis, NP  ranolazine (RANEXA) 500 MG 12 hr tablet Take 1 tablet (500 mg total) by mouth 2 (two) times daily. 05/19/15   Brittainy M Simmons, PA-C   BP 106/65 mmHg  Pulse 69  Temp(Src) 97.9 F (36.6 C) (Oral)  Resp 18  SpO2 98% Physical Exam  Constitutional: She is oriented to person, place, and time. She appears well-developed and well-nourished. No distress.  HENT:  Head: Normocephalic and atraumatic.  Eyes: Conjunctivae and EOM are normal.  Neck: Normal range of motion.  Cardiovascular:  Normal rate and regular rhythm.  Exam reveals no gallop and no friction rub.   No murmur heard. No lower extremity edema or calf tenderness to palpation.   Pulmonary/Chest: Effort normal and breath sounds normal. She has no wheezes. She has no rales. She exhibits no tenderness.  Abdominal: Soft. She exhibits no distension. There is no tenderness. There is no rebound.  Musculoskeletal: Normal range of motion.  Neurological: She is alert and oriented to person, place, and time. Coordination normal.  Speech is goal-oriented. Moves limbs without ataxia.   Skin: Skin is warm and dry.  Psychiatric: She has a normal mood and affect. Her behavior is normal.  Nursing note and vitals reviewed.   ED Course  Procedures (including critical care time) Labs Review Labs Reviewed  URINE RAPID DRUG SCREEN, HOSP PERFORMED - Abnormal; Notable  for the following:    Opiates POSITIVE (*)    Benzodiazepines POSITIVE (*)    All other components within normal limits  MRSA PCR SCREENING  CBC  BASIC METABOLIC PANEL  BRAIN NATRIURETIC PEPTIDE  TROPONIN I  CBC WITH DIFFERENTIAL/PLATELET  TROPONIN I  TROPONIN I  COMPREHENSIVE METABOLIC PANEL  LIPASE, BLOOD  PROTIME-INR  I-STAT TROPOININ, ED  POC URINE PREG, ED    Imaging Review Dg Chest 2 View  06/09/2015   CLINICAL DATA:  Chest pain  EXAM: CHEST  2 VIEW  COMPARISON:  04/11/2015  FINDINGS: Lungs are clear.  No pleural effusion or pneumothorax.  The heart is normal in size.  Visualized osseous structures are within normal limits.  IMPRESSION: Normal chest radiographs.   Electronically Signed   By: Charline Bills M.D.   On: 06/09/2015 20:35     EKG Interpretation   Date/Time:  Saturday June 09 2015 19:58:15 EDT Ventricular Rate:  75 PR Interval:  129 QRS Duration: 84 QT Interval:  388 QTC Calculation: 433 R Axis:   67 Text Interpretation:  Sinus rhythm Abnormal R-wave progression, early  transition No significant change since last tracing  Confirmed by Gwendolyn Grant   MD, BLAIR ([id number]) on 06/09/2015 8:03:51 PM      MDM   Final diagnoses:  Chest pain, unspecified chest pain type    8:18 PM Labs and chest xray pending. EKG shows no acute ischemic changes at this time. Vitals stable and patient afebrile.   I personally reviewed the patient's notes from previous visits which show previous MI in April 2016. Patient will be admitted to cycle troponin.   [address] [place], PA-C 06/10/15 0533  Elwin Mocha, MD 06/10/15 [phone]

## 2015-06-10 ENCOUNTER — Observation Stay (HOSPITAL_COMMUNITY): Payer: Medicaid Other

## 2015-06-10 ENCOUNTER — Encounter (HOSPITAL_COMMUNITY): Payer: Self-pay | Admitting: Cardiology

## 2015-06-10 DIAGNOSIS — K219 Gastro-esophageal reflux disease without esophagitis: Secondary | ICD-10-CM | POA: Diagnosis not present

## 2015-06-10 DIAGNOSIS — K2970 Gastritis, unspecified, without bleeding: Secondary | ICD-10-CM | POA: Diagnosis not present

## 2015-06-10 DIAGNOSIS — R1011 Right upper quadrant pain: Secondary | ICD-10-CM

## 2015-06-10 DIAGNOSIS — R079 Chest pain, unspecified: Secondary | ICD-10-CM | POA: Diagnosis not present

## 2015-06-10 DIAGNOSIS — Z955 Presence of coronary angioplasty implant and graft: Secondary | ICD-10-CM | POA: Diagnosis not present

## 2015-06-10 LAB — TROPONIN I
TROPONIN I: 0.04 ng/mL — AB (ref <0.031)
Troponin I: 0.06 ng/mL — ABNORMAL HIGH (ref <0.031)
Troponin I: <0.03 ng/mL (ref <0.031)

## 2015-06-10 LAB — CBC WITH DIFFERENTIAL/PLATELET
BASOS PCT: 0 % (ref 0–1)
Basophils Absolute: 0 10*3/uL (ref 0.0–0.1)
Eosinophils Absolute: 0.1 10*3/uL (ref 0.0–0.7)
Eosinophils Relative: 1 % (ref 0–5)
HCT: 38.6 % (ref 36.0–46.0)
HEMOGLOBIN: 13.2 g/dL (ref 12.0–15.0)
LYMPHS PCT: 28 % (ref 12–46)
Lymphs Abs: 1.2 10*3/uL (ref 0.7–4.0)
MCH: 30.4 pg (ref 26.0–34.0)
MCHC: 34.2 g/dL (ref 30.0–36.0)
MCV: 88.9 fL (ref 78.0–100.0)
MONO ABS: 0.3 10*3/uL (ref 0.1–1.0)
Monocytes Relative: 8 % (ref 3–12)
Neutro Abs: 2.6 10*3/uL (ref 1.7–7.7)
Neutrophils Relative %: 63 % (ref 43–77)
PLATELETS: 151 10*3/uL (ref 150–400)
RBC: 4.34 MIL/uL (ref 3.87–5.11)
RDW: 11.8 % (ref 11.5–15.5)
WBC: 4.1 10*3/uL (ref 4.0–10.5)

## 2015-06-10 LAB — PROTIME-INR
INR: 1.15 (ref 0.00–1.49)
Prothrombin Time: 14.9 seconds (ref 11.6–15.2)

## 2015-06-10 LAB — COMPREHENSIVE METABOLIC PANEL
ALBUMIN: 3.7 g/dL (ref 3.5–5.0)
ALT: 16 U/L (ref 14–54)
AST: 15 U/L (ref 15–41)
Alkaline Phosphatase: 78 U/L (ref 38–126)
Anion gap: 7 (ref 5–15)
BILIRUBIN TOTAL: 0.6 mg/dL (ref 0.3–1.2)
BUN: 10 mg/dL (ref 6–20)
CALCIUM: 8.8 mg/dL — AB (ref 8.9–10.3)
CO2: 27 mmol/L (ref 22–32)
Chloride: 104 mmol/L (ref 101–111)
Creatinine, Ser: 0.82 mg/dL (ref 0.44–1.00)
Glucose, Bld: 114 mg/dL — ABNORMAL HIGH (ref 65–99)
POTASSIUM: 3.8 mmol/L (ref 3.5–5.1)
Sodium: 138 mmol/L (ref 135–145)
Total Protein: 6.4 g/dL — ABNORMAL LOW (ref 6.5–8.1)

## 2015-06-10 LAB — MRSA PCR SCREENING: MRSA by PCR: NEGATIVE

## 2015-06-10 LAB — LIPASE, BLOOD: LIPASE: 20 U/L — AB (ref 22–51)

## 2015-06-10 MED ORDER — KETOROLAC TROMETHAMINE 30 MG/ML IJ SOLN
30.0000 mg | Freq: Once | INTRAMUSCULAR | Status: AC
  Administered 2015-06-10: 30 mg via INTRAVENOUS
  Filled 2015-06-10: qty 1

## 2015-06-10 MED ORDER — AMLODIPINE BESYLATE 5 MG PO TABS
5.0000 mg | ORAL_TABLET | Freq: Every day | ORAL | Status: DC
  Administered 2015-06-10 – 2015-06-11 (×2): 5 mg via ORAL
  Filled 2015-06-10 (×2): qty 1

## 2015-06-10 MED ORDER — ONDANSETRON HCL 4 MG/2ML IJ SOLN
4.0000 mg | Freq: Once | INTRAMUSCULAR | Status: AC
  Administered 2015-06-10: 4 mg via INTRAVENOUS
  Filled 2015-06-10: qty 2

## 2015-06-10 MED ORDER — SODIUM CHLORIDE 0.9 % IV SOLN: INTRAVENOUS | Status: DC

## 2015-06-10 MED ORDER — ISOSORBIDE MONONITRATE ER 60 MG PO TB24
90.0000 mg | ORAL_TABLET | Freq: Every day | ORAL | Status: DC
  Administered 2015-06-10 – 2015-06-11 (×2): 90 mg via ORAL
  Filled 2015-06-10 (×4): qty 1

## 2015-06-10 NOTE — Progress Notes (Addendum)
[facility] PROGRESS NOTE  Christina Morrison ZOX:[id number] DOB: 06/07/1976 DOA: 06/09/2015 PCP: Georgann Housekeeper, MD  Assessment/Plan:  Principal Problem:   Chronic recurrent chest pain: EKG without ischemic changes and cardiac markers not significantly elevated. Cardiology recommends switching diltiazem to Norvasc and titrate as blood pressure allows. Right upper quadrant ultrasound, LFTs, lipase unremarkable.  Cardiology recommends GI evaluation which is reasonable for her chronic substernal chest pain, wakes her up at night and improved with sitting forward. D/w Enid Baas, [place] for EGD. He will see Monday. Will make NPO after midnight Active Problems:   Anxiety and depression   Tobacco use disorder   CAD (coronary artery disease)   Hyperlipemia  Code Status:  full Family Communication:  mother at bedside Disposition Plan:    Consultants:  [facility]  [facility]  Procedures:     Antibiotics:    HPI/Subjective: Had a headache earlier. Improved now. Describes frequent chest pain that brings her to tears. Feels like previous MI. Worse with lying flat. Has accelerated recently. Agrees to GI consult.  Objective: Filed Vitals:   06/10/15 0500  BP: 101/65  Pulse: 65  Temp: 97.7 F (36.5 C)  Resp: 18    Intake/Output Summary (Last 24 hours) at 06/10/15 1515 Last data filed at 06/10/15 1322  Gross per 24 hour  Intake    240 ml  Output      0 ml  Net    240 ml   Filed Weights   06/10/15 0500  Weight: 66.633 kg (146 lb 14.4 oz)    Exam:   General:  Appears comfortable. Cooperative and pleasant.  Cardiovascular: Regular rate rhythm without murmurs gallops rubs  Respiratory: Clear to auscultation bilaterally without wheezes rhonchi or rales   Abdomen: Soft nontender nondistended  Ext: No clubbing cyanosis or edema  Basic Metabolic Panel:  Recent Labs Lab 06/09/15 2010 06/10/15 0448  NA 137 138  K 4.4 3.8  CL 104 104  CO2 24 27  GLUCOSE 86 114*   BUN 9 10  CREATININE 0.77 0.82  CALCIUM 9.0 8.8*   Liver Function Tests:  Recent Labs Lab 06/10/15 0448  AST 15  ALT 16  ALKPHOS 78  BILITOT 0.6  PROT 6.4*  ALBUMIN 3.7    Recent Labs Lab 06/10/15 0448  LIPASE 20*   No results for input(s): AMMONIA in the last 168 hours. CBC:  Recent Labs Lab 06/09/15 2010 06/10/15 0448  WBC 4.9 4.1  NEUTROABS  --  2.6  HGB 13.5 13.2  HCT 38.7 38.6  MCV 88.6 88.9  PLT 165 151   Cardiac Enzymes:  Recent Labs Lab 06/09/15 2340 06/10/15 0437 06/10/15 1119  TROPONINI <0.03 0.06* 0.04*   BNP (last 3 results)  Recent Labs  06/09/15 2010  BNP 32.4    ProBNP (last 3 results) No results for input(s): PROBNP in the last 8760 hours.  CBG: No results for input(s): GLUCAP in the last 168 hours.  Recent Results (from the past 240 hour(s))  MRSA PCR Screening     Status: None   Collection Time: 06/09/15 10:59 PM  Result Value Ref Range Status   MRSA by PCR NEGATIVE NEGATIVE Final    Comment:        The GeneXpert MRSA Assay (FDA approved for NASAL specimens only), is one component of a comprehensive MRSA colonization surveillance program. It is not intended to diagnose MRSA infection nor to guide or monitor treatment for MRSA infections.      Studies: Dg Chest 2  View  06/09/2015   CLINICAL DATA:  Chest pain  EXAM: CHEST  2 VIEW  COMPARISON:  04/11/2015  FINDINGS: Lungs are clear.  No pleural effusion or pneumothorax.  The heart is normal in size.  Visualized osseous structures are within normal limits.  IMPRESSION: Normal chest radiographs.   Electronically Signed   By: Charline Bills M.D.   On: 06/09/2015 20:35   US Abdomen Complete  06/10/2015   CLINICAL DATA:  Chest pain and nausea.  EXAM: ULTRASOUND ABDOMEN COMPLETE  COMPARISON:  None.  FINDINGS: Gallbladder: No gallstones or wall thickening visualized. No sonographic Murphy sign noted.  Common bile duct: Diameter: 3.5 mm.  Liver: No focal lesion identified.  Within normal limits in parenchymal echogenicity.  IVC: No abnormality visualized.  Pancreas: Visualized portion unremarkable.  Spleen: Size and appearance within normal limits.  Right Kidney: Length: 10.5 cm. Echogenicity within normal limits. No mass or hydronephrosis visualized.  Left Kidney: Length: 10.1 cm. Echogenicity within normal limits. No mass or hydronephrosis visualized.  Abdominal aorta: No aneurysm visualized.  Other findings: None.  IMPRESSION: No acute findings.   Electronically Signed   By: Elberta Fortis M.D.   On: 06/10/2015 12:58    Scheduled Meds: . amLODipine  5 mg Oral Daily  . aspirin EC  81 mg Oral Daily  . buPROPion  150 mg Oral QHS  . buPROPion  300 mg Oral Daily  . clopidogrel  75 mg Oral Q breakfast  . heparin  5,000 Units Subcutaneous 3 times per day  . isosorbide mononitrate  90 mg Oral Daily  . pravastatin  40 mg Oral Daily  . ranolazine  500 mg Oral BID   Continuous Infusions: . sodium chloride 50 mL/hr at 06/10/15 0400  . sodium chloride    . nitroGLYCERIN 5 mcg/min (06/10/15 0915)    Time spent: 25 minutes  Joon Pohle L  [facility] [email or web address], password [facility] 06/10/2015, 3:15 PM

## 2015-06-10 NOTE — Progress Notes (Signed)
Educated pt the she will be NPO after midnight tonight for GI consult, pt stated she was thinking about it and if she decides to go through with the consult she will agree to NPO order, continues to require Q 3hr pain medication for pain control, will continue to monitor closely.

## 2015-06-10 NOTE — Progress Notes (Signed)
Nitroglycerin drip turned off due to blood pressure 91/62.

## 2015-06-10 NOTE — Consult Note (Signed)
Reason for Consult: chest pain with known CAD   Referring Physician: Dr. Posey Pronto   PCP:  Wenda Low, MD  Primary Cardiologist:Dr. Barbee Mamula Aronov is an 39 y.o. female.    Chief Complaint:  Chest pain  HPI: 39 year old female with cardiac hx. post recent non-ST segment elevation myocardial infarction in the setting of cocaine usage. 03/21/15 she underwent diagnostic cardiac catheterization revealing severe proximal first diagonal disease. This was successfully stented using a vision bare-metal stent.  She was re-admitted 04/10/15 for chest pain-neg troponin and had repeat cath due to timing of pain post stent.  She was found to have double vessel CAD with patent stent in the diagonal, moderate disease in LCX that was unchanged.  She did have coronary spasm during the cath.  She had normal LV function.  She has not used cocaine since prior to first cath.  Since second discharge she has been having chest pain that wakes her from sleep.  Her imdur was increased and then ranexa was started but she was having trouble obtaining due to cost- but has been taking last 2 weeks..     Admitted early Am today with chest pain, similar to what she had prior to MI, described as mid sternal and radiating to both arms with heaviness and then into neck and bil jaws.. Burning with pressure/squeezing like sensation- relief with NTG until now.   Usually only occurs at night but yesterday it continued during the day and she came to ER.  Associated with nausea and diaphoresis, no SOB.   She has been having episodes 4 night out of 7.  She did stop tobacco 2 weeks ago.    EKG SR without acute changes.  troponin now 0,06.  Drug screen + benzo and opiate though she did receive IV morphine in ER  And is on xanax..   Past Medical History  Diagnosis Date  . Depression   . Anxiety   . Bipolar 1 disorder   . Scoliosis   . Migraine   . Osteoarthritis   . Coronary artery disease     a. 03/2015 NSTEMI/PCI:  LM 20ost, LAD 30-40p/m, D1 40-50ost, 90p (2.75x12 Vision BMS), LCX 50p/m, RCA 40-41m EF 65-70% (UDS +for cocaine).  .Marland KitchenTIA (transient ischemic attack)   . Polysubstance abuse     a. tobacco/cocaine    Past Surgical History  Procedure Laterality Date  . Coronary stent placement  03/21/2015    first diagonal  . Left heart catheterization with coronary angiogram N/A 03/21/2015    Procedure: LEFT HEART CATHETERIZATION WITH CORONARY ANGIOGRAM;  Surgeon: MSherren Mocha MD;  [facility];  Service: Cardiovascular;  Laterality: N/A;  . Cardiac catheterization  03/21/2015    Procedure: CORONARY STENT INTERVENTION;  Surgeon: MSherren Mocha MD;  [facility];  Service: Cardiovascular;;  diag bms 2.75 x 12 vision  . Left heart catheterization with coronary angiogram N/A 04/11/2015    Procedure: LEFT HEART CATHETERIZATION WITH CORONARY ANGIOGRAM;  Surgeon: CBurnell Blanks MD;  [facility];  Service: Cardiovascular;  Laterality: N/A;    Family History  Problem Relation Age of Onset  . Mental retardation Mother   . Hypertension Mother   . Hyperlipidemia Mother   . Heart disease Mother   . Depression Mother   . Hypertension Father   . Hyperlipidemia Father   . Heart disease Father   . Mental retardation Father   . Diabetes Father   .  Cancer Maternal Aunt   . Stroke Maternal Grandmother    Social History:  reports that she has been smoking Cigarettes.  She has been smoking about 1.00 pack per day. She has never used smokeless tobacco. She reports that she uses illicit drugs (Cocaine). She reports that she does not drink alcohol.  Allergies: No Known Allergies  OUTPATIENT MEDICATIONS: No current facility-administered medications on file prior to encounter.   Current Outpatient Prescriptions on File Prior to Encounter  Medication Sig Dispense Refill  . ALPRAZolam (XANAX) 0.25 MG tablet Take 1 tablet (0.25 mg total) by mouth 3 (three) times daily as needed for  anxiety. 21 tablet 0  . aspirin EC 81 MG EC tablet Take 1 tablet (81 mg total) by mouth daily.    [name] buPROPion (WELLBUTRIN SR) 150 MG 12 hr tablet Take 1 tablet (150 mg total) by mouth 2 (two) times daily. (Patient taking differently: Take 150-300 mg by mouth 2 (two) times daily. Takes 380m in am and 152min pm) 60 tablet 3  . clopidogrel (PLAVIX) 75 MG tablet Take 1 tablet (75 mg total) by mouth daily with breakfast. 30 tablet 6  . diltiazem (CARDIZEM LA) 120 MG 24 hr tablet Take 1 tablet (120 mg total) by mouth daily. 30 tablet 6  . fluticasone (FLONASE) 50 MCG/ACT nasal spray Place 2 sprays into both nostrils daily as needed for allergies or rhinitis.    . Marland Kitchensosorbide mononitrate (IMDUR) 60 MG 24 hr tablet Take 1 tablet (60 mg total) by mouth daily. 90 tablet 3  . nitroGLYCERIN (NITROSTAT) 0.4 MG SL tablet Place 1 tablet (0.4 mg total) under the tongue every 5 (five) minutes as needed for chest pain. 25 tablet 3  . pravastatin (PRAVACHOL) 40 MG tablet Take 1 tablet (40 mg total) by mouth daily. 30 tablet 6  . ranolazine (RANEXA) 500 MG 12 hr tablet Take 1 tablet (500 mg total) by mouth 2 (two) times daily. 60 tablet 4  . fluconazole (DIFLUCAN) 150 MG tablet Take 1 tablet (150 mg total) by mouth once. Can take additional dose three days later if symptoms persist 1 tablet 3  . metroNIDAZOLE (FLAGYL) 500 MG tablet Take 1 tablet (500 mg total) by mouth 2 (two) times daily. 14 tablet 0  . oxyCODONE-acetaminophen (PERCOCET/ROXICET) 5-325 MG per tablet Take 1-2 tablets by mouth every 6 (six) hours as needed for severe pain (Back and knee pain). 30 tablet 0   CURRENT MEDICATIONS: Scheduled Meds: . aspirin EC  81 mg Oral Daily  . buPROPion  150 mg Oral QHS  . buPROPion  300 mg Oral Daily  . clopidogrel  75 mg Oral Q breakfast  . diltiazem  120 mg Oral Daily  . heparin  5,000 Units Subcutaneous 3 times per day  . isosorbide mononitrate  60 mg Oral Daily  . pravastatin  40 mg Oral Daily  . ranolazine   500 mg Oral BID   Continuous Infusions: . sodium chloride 50 mL/hr at 06/10/15 0400  . nitroGLYCERIN Stopped (06/10/15 0400)   PRN Meds:.acetaminophen, ALPRAZolam, fluticasone, HYDROmorphone (DILAUDID) injection, ondansetron (ZOFRAN) IV   Results for orders placed or performed during the hospital encounter of 06/09/15 (from the past 48 hour(s))  CBC     Status: None   Collection Time: 06/09/15  8:10 PM  Result Value Ref Range   WBC 4.9 4.0 - 10.5 K/uL   RBC 4.37 3.87 - 5.11 MIL/uL   Hemoglobin 13.5 12.0 - 15.0 g/dL   HCT 38.7 36.0 -  46.0 %   MCV 88.6 78.0 - 100.0 fL   MCH 30.9 26.0 - 34.0 pg   MCHC 34.9 30.0 - 36.0 g/dL   RDW 11.7 11.5 - 15.5 %   Platelets 165 150 - 400 K/uL  Basic metabolic panel     Status: None   Collection Time: 06/09/15  8:10 PM  Result Value Ref Range   Sodium 137 135 - 145 mmol/L   Potassium 4.4 3.5 - 5.1 mmol/L   Chloride 104 101 - 111 mmol/L   CO2 24 22 - 32 mmol/L   Glucose, Bld 86 65 - 99 mg/dL   BUN 9 6 - 20 mg/dL   Creatinine, Ser 0.77 0.44 - 1.00 mg/dL   Calcium 9.0 8.9 - 10.3 mg/dL   GFR calc non Af Amer >60 >60 mL/min   GFR calc Af Amer >60 >60 mL/min    Comment: (NOTE) The eGFR has been calculated using the CKD EPI equation. This calculation has not been validated in all clinical situations. eGFR's persistently <60 mL/min signify possible Chronic Kidney Disease.    Anion gap 9 5 - 15  Brain natriuretic peptide     Status: None   Collection Time: 06/09/15  8:10 PM  Result Value Ref Range   B Natriuretic Peptide 32.4 0.0 - 100.0 pg/mL  I-stat troponin, ED  (not at [facility])     Status: None   Collection Time: 06/09/15  8:22 PM  Result Value Ref Range   Troponin i, poc 0.01 0.00 - 0.08 ng/mL   Comment 3            Comment: Due to the release kinetics of cTnI, a negative result within the first hours of the onset of symptoms does not rule out myocardial infarction with certainty. If myocardial infarction is still  suspected, repeat the test at appropriate intervals.   POC Urine Pregnancy, ED (pre-menopausal females)  not at [facility]     Status: None   Collection Time: 06/09/15  9:09 PM  Result Value Ref Range   Preg Test, Ur NEGATIVE NEGATIVE    Comment:        THE SENSITIVITY OF THIS METHODOLOGY IS >24 mIU/mL   Urine rapid drug screen (hosp performed)     Status: Abnormal   Collection Time: 06/09/15  9:52 PM  Result Value Ref Range   Opiates POSITIVE (A) NONE DETECTED   Cocaine NONE DETECTED NONE DETECTED   Benzodiazepines POSITIVE (A) NONE DETECTED   Amphetamines NONE DETECTED NONE DETECTED   Tetrahydrocannabinol NONE DETECTED NONE DETECTED   Barbiturates NONE DETECTED NONE DETECTED    Comment:        DRUG SCREEN FOR MEDICAL PURPOSES ONLY.  IF CONFIRMATION IS NEEDED FOR ANY PURPOSE, NOTIFY LAB WITHIN 5 DAYS.        LOWEST DETECTABLE LIMITS FOR URINE DRUG SCREEN Drug Class       Cutoff (ng/mL) Amphetamine      1000 Barbiturate      200 Benzodiazepine   426 Tricyclics       834 Opiates          300 Cocaine          300 THC              50   MRSA PCR Screening     Status: None   Collection Time: 06/09/15 10:59 PM  Result Value Ref Range   MRSA by PCR NEGATIVE NEGATIVE    Comment:  The GeneXpert MRSA Assay (FDA approved for NASAL specimens only), is one component of a comprehensive MRSA colonization surveillance program. It is not intended to diagnose MRSA infection nor to guide or monitor treatment for MRSA infections.   Troponin I (q 6hr x 3)     Status: None   Collection Time: 06/09/15 11:40 PM  Result Value Ref Range   Troponin I <0.03 <0.031 ng/mL    Comment:        NO INDICATION OF MYOCARDIAL INJURY.   Troponin I (q 6hr x 3)     Status: Abnormal   Collection Time: 06/10/15  4:37 AM  Result Value Ref Range   Troponin I 0.06 (H) <0.031 ng/mL    Comment:        PERSISTENTLY INCREASED TROPONIN VALUES IN THE RANGE OF 0.04-0.49 ng/mL CAN BE SEEN IN:        -UNSTABLE ANGINA       -CONGESTIVE HEART FAILURE       -MYOCARDITIS       -CHEST TRAUMA       -ARRYHTHMIAS       -LATE PRESENTING MYOCARDIAL INFARCTION       -COPD   CLINICAL FOLLOW-UP RECOMMENDED.   CBC with Differential/Platelet     Status: None   Collection Time: 06/10/15  4:48 AM  Result Value Ref Range   WBC 4.1 4.0 - 10.5 K/uL   RBC 4.34 3.87 - 5.11 MIL/uL   Hemoglobin 13.2 12.0 - 15.0 g/dL   HCT 38.6 36.0 - 46.0 %   MCV 88.9 78.0 - 100.0 fL   MCH 30.4 26.0 - 34.0 pg   MCHC 34.2 30.0 - 36.0 g/dL   RDW 11.8 11.5 - 15.5 %   Platelets 151 150 - 400 K/uL   Neutrophils Relative % 63 43 - 77 %   Neutro Abs 2.6 1.7 - 7.7 K/uL   Lymphocytes Relative 28 12 - 46 %   Lymphs Abs 1.2 0.7 - 4.0 K/uL   Monocytes Relative 8 3 - 12 %   Monocytes Absolute 0.3 0.1 - 1.0 K/uL   Eosinophils Relative 1 0 - 5 %   Eosinophils Absolute 0.1 0.0 - 0.7 K/uL   Basophils Relative 0 0 - 1 %   Basophils Absolute 0.0 0.0 - 0.1 K/uL  Comprehensive metabolic panel     Status: Abnormal   Collection Time: 06/10/15  4:48 AM  Result Value Ref Range   Sodium 138 135 - 145 mmol/L   Potassium 3.8 3.5 - 5.1 mmol/L   Chloride 104 101 - 111 mmol/L   CO2 27 22 - 32 mmol/L   Glucose, Bld 114 (H) 65 - 99 mg/dL   BUN 10 6 - 20 mg/dL   Creatinine, Ser 0.82 0.44 - 1.00 mg/dL   Calcium 8.8 (L) 8.9 - 10.3 mg/dL   Total Protein 6.4 (L) 6.5 - 8.1 g/dL   Albumin 3.7 3.5 - 5.0 g/dL   AST 15 15 - 41 U/L   ALT 16 14 - 54 U/L   Alkaline Phosphatase 78 38 - 126 U/L   Total Bilirubin 0.6 0.3 - 1.2 mg/dL   GFR calc non Af Amer >60 >60 mL/min   GFR calc Af Amer >60 >60 mL/min    Comment: (NOTE) The eGFR has been calculated using the CKD EPI equation. This calculation has not been validated in all clinical situations. eGFR's persistently <60 mL/min signify possible Chronic Kidney Disease.    Anion gap 7 5 - 15  Lipase, blood     Status: Abnormal   Collection Time: 06/10/15  4:48 AM  Result Value Ref Range   Lipase  20 (L) 22 - 51 U/L  Protime-INR     Status: None   Collection Time: 06/10/15  4:48 AM  Result Value Ref Range   Prothrombin Time 14.9 11.6 - 15.2 seconds   INR 1.15 0.00 - 1.49   Dg Chest 2 View  06/09/2015   CLINICAL DATA:  Chest pain  EXAM: CHEST  2 VIEW  COMPARISON:  04/11/2015  FINDINGS: Lungs are clear.  No pleural effusion or pneumothorax.  The heart is normal in size.  Visualized osseous structures are within normal limits.  IMPRESSION: Normal chest radiographs.   Electronically Signed   By: Julian Hy M.D.   On: 06/09/2015 20:35    ROS:  General:no colds or fevers, no weight changes Skin:no rashes or ulcers HEENT:no blurred vision, no congestion CV:see HPI PUL:see HPI GI:no diarrhea constipation or melena, no indigestion GU:no hematuria, no dysuria MS:no joint pain, no claudication, hx of back pain on home narcotics Neuro:no syncope, no lightheadedness, hx migraines Endo:no diabetes, no thyroid disease  Blood pressure 101/65, pulse 65, temperature 97.7 F (36.5 C), temperature source Oral, resp. rate 18, weight 146 lb 14.4 oz (66.633 kg), last menstrual period 05/16/2015, SpO2 98 %.  Wt Readings from Last 3 Encounters:  06/10/15 146 lb 14.4 oz (66.633 kg)  04/25/15 146 lb 6.4 oz (66.407 kg)  03/28/15 149 lb 8 oz (67.813 kg)    PE: General:Pleasant affect, NAD Skin:Warm and dry, brisk capillary refill HEENT:normocephalic, sclera clear, mucus membranes moist Neck:supple, no JVD, no bruits  Heart:S1S2 RRR without murmur, gallup, rub or click Lungs:clear without rales, rhonchi, or wheezes QTM:AUQJ, mild rt upper quad tenderness, + BS, do not palpate liver spleen or masses Ext:no lower ext edema, 2+ pedal pulses, 2+ radial pulses Neuro:alert and oriented X 3, MAE, follows commands, + facial symmetry Tele:  SR   Assessment/Plan Principal Problem:   ACS (acute coronary syndrome) Active Problems:   Anxiety and depression   Tobacco use disorder   CAD (coronary  artery disease)   Hyperlipemia  1. Chest pain with mild bump in troponin with hx of NSTEMI and PCI to diag in April, follow up cath for recurrent chest pain with patent stent, + LCX disease stable and coronary spasm.  Since then episodes of chest pain with increase in Imdur, ranexa added. She is on CCB as well.  On ASA and plavix, this has been ongoing issue.   Add pepcid with asa and plavix may have component of reflux.   Pt on dilaudid for the chest pain, has rec'd 1 mg every 3 hours, but has break through pain.  She is NPO.   IV NTG stopped due to BP of 95/59 - give IV fluids and restart NTG with continued pain.   2. CAD with stent to diag 03/2015 also with coronary spasm on cath. Has not used cocaine and has stopped tobacco.  3.  Hyperlipidemia on  Pravachol will recheck lipids  4. ? GI component GI cocktail in ER without relief  Will add pepcid for now   5. Anxiety & depression   6. Tobacco use- stopped 2 weeks ago  7. Chronic pain with scoliosis, hx migraines   [facility] R  Nurse Practitioner Certified [place] Pager [phone] or after 5pm or weekends call [phone] 06/10/2015, 7:26 AM  I have seen, examined the patient,  and reviewed the above assessment and plan.  On exam, she is comfortable appearing but complains of 9/10 chest pan.   Changes to above are made where necessary.  ekg does not reveal ischemic changes and cardiac markers are not significantly elevated. She has had a relook cath recently which revealed coronary spasm but no lesions requiring revascularization.  In the absence of robust elevation in CMs, I am doubtful that relook cath would be beneficial at this time.   She has found no relief with Nitro drip or GI cocktail and requests dilaudid for pain IV.  I have discussed with her that dilaudid is not an appropriate treatment for her chest pain.  Will per her request stop IV nitro.  Switch diltiazem to norvasc and titrate as BP allows. Will  order RUQ [place] to evaluate for other causes of her discomfort.  May also be beneficial to have GI evaluation.  Co Sign: Thompson Grayer, MD 06/10/2015 11:09 AM

## 2015-06-10 NOTE — H&P (Signed)
[facility] History and Physical  Patient: Christina Morrison  MRN: [id number]  DOB: 1976/03/01  DOS: the patient was seen and examined on 06/09/2015 PCP: Georgann Housekeeper, MD  Referring physician: Dr. Gwendolyn Grant Chief Complaint: Chest pain  HPI: Christina Morrison is a 39 y.o. 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 The patient is presenting with complaints of chest pain. The pain is located centrally and is radiating to both of her hands. The pain is burning with pressure-like sensation. She describes this pain is similar to her prior heart attack happened in April. She also describes this pain is worse than that pain. She denies any fever or chills denies any chest cough denies any shortness of breath at the time of my evaluation denies any diaphoresis denies any nausea or vomiting denies any abdominal pain denies any diarrhea and denies any constipation denies any leg tenderness or recent travel. She has chronic leg swelling which has been present since last one year and unchanged. She has quit smoking 2 weeks ago. She denies any alcohol abuse or drug abuse at present. She mentions she is compliant with all her medications. She has recently started taking Ranexa.  The patient is coming from home.  At her baseline ambulates without any support And is independent for most of her ADL manages her medication on her own.  Review of Systems: as mentioned in the history of present illness.  A comprehensive review of the other systems is negative.  Past Medical History  Diagnosis Date  . Depression   . Anxiety   . Bipolar 1 disorder   . Scoliosis   . Migraine   . Osteoarthritis   . Coronary artery disease     a. 03/2015 NSTEMI/PCI: LM 20ost, LAD 30-40p/m, D1 40-50ost, 90p (2.75x12 Vision BMS), LCX 50p/m, RCA 40-38m, EF 65-70% (UDS +for cocaine).  [name] TIA (transient ischemic  attack)   . Polysubstance abuse     a. tobacco/cocaine   Past Surgical History  Procedure Laterality Date  . Coronary stent placement  03/21/2015    first diagonal  . Left heart catheterization with coronary angiogram N/A 03/21/2015    Procedure: LEFT HEART CATHETERIZATION WITH CORONARY ANGIOGRAM;  Surgeon: [name], MD;  Location: [facility] CATH LAB;  Service: Cardiovascular;  Laterality: N/A;  . Cardiac catheterization  03/21/2015    Procedure: CORONARY STENT INTERVENTION;  Surgeon: [name], MD;  Location: [facility] CATH LAB;  Service: Cardiovascular;;  diag bms 2.75 x 12 vision  . Left heart catheterization with coronary angiogram N/A 04/11/2015    Procedure: LEFT HEART CATHETERIZATION WITH CORONARY ANGIOGRAM;  Surgeon: Kathleene Hazel, MD;  Location: [facility] CATH LAB;  Service: Cardiovascular;  Laterality: N/A;   Social History:  reports that she has been smoking Cigarettes.  She has been smoking about 1.00 pack per day. She has never used smokeless tobacco. She reports that she uses illicit drugs (Cocaine). She reports that she does not drink alcohol.  No Known Allergies  Family History  Problem Relation Age of Onset  . Mental retardation Mother   . Hypertension Mother   . Hyperlipidemia Mother   . Heart disease Mother   . Depression Mother   . Hypertension Father   . Hyperlipidemia Father   . Heart disease Father   . Mental retardation Father   . Diabetes Father   . Cancer Maternal Aunt   . Stroke Maternal Grandmother  Prior to Admission medications   Medication Sig Start Date End Date Taking? Authorizing Provider  ALPRAZolam (XANAX) 0.25 MG tablet Take 1 tablet (0.25 mg total) by mouth 3 (three) times daily as needed for anxiety. 04/09/15  Yes Ok Anis, NP  aspirin EC 81 MG EC tablet Take 1 tablet (81 mg total) by mouth daily. 03/23/15  Yes Ok Anis, NP  buPROPion [facility] SR) 150 MG 12 hr tablet Take 1 tablet (150 mg total) by mouth 2 (two) times  daily. Patient taking differently: Take 150-300 mg by mouth 2 (two) times daily. Takes 300mg  in am and 150mg  in pm 03/27/15  Yes Ok Anis, NP  clopidogrel (PLAVIX) 75 MG tablet Take 1 tablet (75 mg total) by mouth daily with breakfast. 03/23/15  Yes Ok Anis, NP  diltiazem (CARDIZEM LA) 120 MG 24 hr tablet Take 1 tablet (120 mg total) by mouth daily. 03/23/15  Yes Ok Anis, NP  fluticasone (FLONASE) 50 MCG/ACT nasal spray Place 2 sprays into both nostrils daily as needed for allergies or rhinitis.   Yes Historical Provider, MD  guaiFENesin (MUCINEX) 600 MG 12 hr tablet Take 600 mg by mouth 2 (two) times daily as needed for cough or to loosen phlegm.   Yes Historical Provider, MD  isosorbide mononitrate (IMDUR) 60 MG 24 hr tablet Take 1 tablet (60 mg total) by mouth daily. 04/27/15  Yes Ok Anis, NP  nitroGLYCERIN (NITROSTAT) 0.4 MG SL tablet Place 1 tablet (0.4 mg total) under the tongue every 5 (five) minutes as needed for chest pain. 03/23/15  Yes Ok Anis, NP  oxycodone (OXY-IR) 5 MG capsule Take 5 mg by mouth every 4 (four) hours as needed for pain.   Yes Historical Provider, MD  pravastatin (PRAVACHOL) 40 MG tablet Take 1 tablet (40 mg total) by mouth daily. 03/23/15  Yes Ok Anis, NP  ranolazine (RANEXA) 500 MG 12 hr tablet Take 1 tablet (500 mg total) by mouth 2 (two) times daily. 05/19/15  Yes Brittainy Sherlynn Carbon, PA-C  fluconazole (DIFLUCAN) 150 MG tablet Take 1 tablet (150 mg total) by mouth once. Can take additional dose three days later if symptoms persist 04/25/15   Tereso Newcomer, MD  metroNIDAZOLE (FLAGYL) 500 MG tablet Take 1 tablet (500 mg total) by mouth 2 (two) times daily. 04/26/15   Tereso Newcomer, MD  oxyCODONE-acetaminophen (PERCOCET/ROXICET) 5-325 MG per tablet Take 1-2 tablets by mouth every 6 (six) hours as needed for severe pain (Back and knee pain). 04/12/15   Brittainy Sherlynn Carbon, PA-C    Physical Exam: Filed Vitals:     06/10/15 0050 06/10/15 0305 06/10/15 0355 06/10/15 0405  BP: 101/63 94/56 95/59  91/62  Pulse: 71 72 75 70  Temp:      TempSrc:      Resp: 16 15 19 13   SpO2: 97% 98% 99% 98%    General: Alert, Awake and Oriented to Time, Place and Person. Appear in moderate distress Eyes: PERRL ENT: Oral Mucosa clear moist. Neck: no JVD Cardiovascular: S1 and S2 Present, no Murmur, Peripheral Pulses Present Respiratory: Bilateral Air entry equal and Decreased,  Clear to Auscultation, no Crackles, no wheezes Abdomen: Bowel Sound present, Soft and non tender Skin: no Rash Extremities: Trace Pedal edema, no calf tenderness Neurologic: Grossly no focal neuro deficit.  Labs on Admission:  CBC:  Recent Labs Lab 06/09/15 2010  WBC 4.9  HGB 13.5  HCT 38.7  MCV 88.6  PLT 165  CMP     Component Value Date/Time   NA 137 06/09/2015 2010   K 4.4 06/09/2015 2010   CL 104 06/09/2015 2010   CO2 24 06/09/2015 2010   GLUCOSE 86 06/09/2015 2010   BUN 9 06/09/2015 2010   CREATININE 0.77 06/09/2015 2010   CREATININE 0.67 01/24/2015 1537   CALCIUM 9.0 06/09/2015 2010   PROT 6.3 04/11/2015 1654   ALBUMIN 3.6 04/11/2015 1654   AST 20 04/11/2015 1654   ALT 24 04/11/2015 1654   ALKPHOS 100 04/11/2015 1654   BILITOT 0.4 04/11/2015 1654   GFRNONAA >60 06/09/2015 2010   GFRNONAA >89 01/24/2015 1537   GFRAA >60 06/09/2015 2010   GFRAA >89 01/24/2015 1537    No results for input(s): LIPASE, AMYLASE in the last 168 hours.   Recent Labs Lab 06/09/15 2340  TROPONINI <0.03   BNP (last 3 results)  Recent Labs  06/09/15 2010  BNP 32.4    ProBNP (last 3 results) No results for input(s): PROBNP in the last 8760 hours.   Radiological Exams on Admission: Dg Chest 2 View  06/09/2015   CLINICAL DATA:  Chest pain  EXAM: CHEST  2 VIEW  COMPARISON:  04/11/2015  FINDINGS: Lungs are clear.  No pleural effusion or pneumothorax.  The heart is normal in size.  Visualized osseous structures are within  normal limits.  IMPRESSION: Normal chest radiographs.   Electronically Signed   By: Charline Bills M.D.   On: 06/09/2015 20:35   EKG: Independently reviewed. unchanged from previous tracings, normal sinus rhythm, nonspecific ST and T waves changes.  Assessment/Plan Principal Problem:   Chest pain Active Problems:   Family history of diabetes mellitus (DM)   Anxiety and depression   CAD (coronary artery disease)   Hyperlipemia   1. Chest pain The patient presents with chest pain which she feels is similar to her prior MI. her initial EKG and troponin does not show any signs of acute ischemia. her chest x-ray is showing no acute abnormality. At present I will admit the patient to the hospital for observation. I will obtain serial troponin every 6 hours, monitor on telemetry, get cardiology consult  in the morning to rule out ACS.  Continue with aspirin,statin, sublingual nitroglycerin. Patient is not on beta blocker at home, currently continuing Cardizem as per earlier cardiology recommendation. I would also put her on nitroglycerin drip. Monitoring step down unit.   2.Anxiety. Continuing Xanax continuing Wellbutrin.  3.Dyslipidemia. Continue Pravachol.  4.History of substance abuse. History of smoking. Patient denies any active smoking at present. Continue Wellbutrin. UDS is negative for any acute substance use.  Advance goals of care discussion: Full code   DVT Prophylaxis: subcutaneous Heparin Nutrition: Nothing by mouth except medication  Family Communication: family was present at bedside, opportunity was given to ask question and all questions were answered satisfactorily at the time of interview. Disposition: Admitted as observation, step-down unit.  Author: Lynden Oxford, MD [facility] Pager: [phone] 06/09/2015  If 7PM-7AM, please contact night-coverage [email or web address] Password [email or web address]

## 2015-06-10 NOTE — Progress Notes (Signed)
Troponin levels reported to Einar Pheasant NP at [phone].  He requests that we report them to Day shift doctors so that cardiology can be notified.  Follow up page sent to Dr. Bayard Beaver pager at [phone] am

## 2015-06-11 ENCOUNTER — Encounter (HOSPITAL_COMMUNITY)
Admission: EM | Disposition: A | Discharge status: Home / Self Care | Payer: Self-pay | Source: Home / Self Care | Attending: Emergency Medicine

## 2015-06-11 ENCOUNTER — Observation Stay (HOSPITAL_COMMUNITY): Payer: Medicaid Other | Admitting: Anesthesiology

## 2015-06-11 ENCOUNTER — Encounter (HOSPITAL_COMMUNITY): Payer: Self-pay | Admitting: *Deleted

## 2015-06-11 DIAGNOSIS — K2970 Gastritis, unspecified, without bleeding: Secondary | ICD-10-CM | POA: Diagnosis not present

## 2015-06-11 DIAGNOSIS — R079 Chest pain, unspecified: Secondary | ICD-10-CM | POA: Diagnosis not present

## 2015-06-11 DIAGNOSIS — K219 Gastro-esophageal reflux disease without esophagitis: Secondary | ICD-10-CM | POA: Diagnosis not present

## 2015-06-11 DIAGNOSIS — Z720 Tobacco use: Secondary | ICD-10-CM

## 2015-06-11 DIAGNOSIS — Z955 Presence of coronary angioplasty implant and graft: Secondary | ICD-10-CM | POA: Diagnosis not present

## 2015-06-11 DIAGNOSIS — I249 Acute ischemic heart disease, unspecified: Secondary | ICD-10-CM

## 2015-06-11 HISTORY — PX: ESOPHAGOGASTRODUODENOSCOPY (EGD) WITH PROPOFOL: SHX5813

## 2015-06-11 SURGERY — ESOPHAGOGASTRODUODENOSCOPY (EGD) WITH PROPOFOL
Anesthesia: Monitor Anesthesia Care

## 2015-06-11 MED ORDER — PROPOFOL 10 MG/ML IV BOLUS
INTRAVENOUS | Status: DC | PRN
  Administered 2015-06-11: 100 mg via INTRAVENOUS

## 2015-06-11 MED ORDER — FENTANYL CITRATE (PF) 100 MCG/2ML IJ SOLN
INTRAMUSCULAR | Status: DC | PRN
  Administered 2015-06-11: 50 ug via INTRAVENOUS

## 2015-06-11 MED ORDER — PANTOPRAZOLE SODIUM 40 MG PO TBEC: 40.0000 mg | DELAYED_RELEASE_TABLET | Freq: Two times a day (BID) | ORAL | Status: DC

## 2015-06-11 MED ORDER — BUPROPION HCL ER (SR) 150 MG PO TB12: 150.0000 mg | ORAL_TABLET | Freq: Two times a day (BID) | ORAL | Status: DC

## 2015-06-11 MED ORDER — ISOSORBIDE MONONITRATE ER 30 MG PO TB24: 90.0000 mg | ORAL_TABLET | Freq: Every day | ORAL | Status: DC

## 2015-06-11 MED ORDER — OXYCODONE HCL 5 MG PO TABS
5.0000 mg | ORAL_TABLET | ORAL | Status: DC | PRN
  Administered 2015-06-11: 5 mg via ORAL
  Filled 2015-06-11: qty 1

## 2015-06-11 MED ORDER — MIDAZOLAM HCL 5 MG/5ML IJ SOLN
INTRAMUSCULAR | Status: DC | PRN
  Administered 2015-06-11: 2 mg via INTRAVENOUS

## 2015-06-11 MED ORDER — SODIUM CHLORIDE 0.9 % IV SOLN: INTRAVENOUS | Status: DC

## 2015-06-11 MED ORDER — LACTATED RINGERS IV SOLN
INTRAVENOUS | Status: DC
  Administered 2015-06-11: 1000 mL via INTRAVENOUS

## 2015-06-11 MED ORDER — BUTAMBEN-TETRACAINE-BENZOCAINE 2-2-14 % EX AERO
INHALATION_SPRAY | CUTANEOUS | Status: DC | PRN
  Administered 2015-06-11: 2 via TOPICAL

## 2015-06-11 MED ORDER — AMLODIPINE BESYLATE 5 MG PO TABS: 5.0000 mg | ORAL_TABLET | Freq: Every day | ORAL | Status: DC

## 2015-06-11 MED ORDER — PROPOFOL INFUSION 10 MG/ML OPTIME
INTRAVENOUS | Status: DC | PRN
  Administered 2015-06-11: 100 ug/kg/min via INTRAVENOUS

## 2015-06-11 MED ORDER — LACTATED RINGERS IV SOLN
INTRAVENOUS | Status: DC | PRN
  Administered 2015-06-11: 08:00:00 via INTRAVENOUS

## 2015-06-11 NOTE — Progress Notes (Signed)
Patient's endoscopy was well tolerated, and no major abnormalities were identified to account for her chest pain.  I am starting her empirically on high-dose PPI therapy to try to take acid reflux out of the equation, as a possible source of her symptoms.  I have also ordered a heart healthy diet.  From the GI tract standpoint, discharge at any time is satisfactory. Her primary physician could follow-up with her to see whether the PPI therapy is effective in preventing her chest pain symptoms; if not, the PPI could be stopped at that point. If she continues to have such symptoms, referral to a Medical Center, either for cardiology and/or GI evaluation, would be the next logical step.  Florencia Reasons, M.D. Pager [phone] If no answer or after 5 PM call [phone]

## 2015-06-11 NOTE — Progress Notes (Signed)
TELEMETRY: Reviewed telemetry pt in NSR: Filed Vitals:   06/11/15 0915 06/11/15 0920 06/11/15 0925 06/11/15 0930  BP: 107/66  107/66   Pulse: 81 81 79 81  Temp:      TempSrc:      Resp: Weight:      SpO2: 100% 99% 100% 100%    Intake/Output Summary (Last 24 hours) at 06/11/15 1104 Last data filed at 06/11/15 0852  Gross per 24 hour  Intake   1895 ml  Output      0 ml  Net   1895 ml   Filed Weights   06/10/15 0500 06/11/15 0400  Weight: 66.633 kg (146 lb 14.4 oz) 68.085 kg (150 lb 1.6 oz)    Subjective Feeling much better. Still some mild pain. Had EGD earlier today.  [name] amLODipine  5 mg Oral Daily  . aspirin EC  81 mg Oral Daily  . buPROPion  150 mg Oral QHS  . buPROPion  300 mg Oral Daily  . clopidogrel  75 mg Oral Q breakfast  . heparin  5,000 Units Subcutaneous 3 times per day  . isosorbide mononitrate  90 mg Oral Daily  . pantoprazole  40 mg Oral BID AC  . pravastatin  40 mg Oral Daily  . ranolazine  500 mg Oral BID      LABS: Basic Metabolic Panel:  Recent Labs  16/10/96 2010 06/10/15 0448  NA 137 138  K 4.4 3.8  CL 104 104  CO2 24 27  GLUCOSE 86 114*  BUN 9 10  CREATININE 0.77 0.82  CALCIUM 9.0 8.8*   Liver Function Tests:  Recent Labs  06/10/15 0448  AST 15  ALT 16  ALKPHOS 78  BILITOT 0.6  PROT 6.4*  ALBUMIN 3.7    Recent Labs  06/10/15 0448  LIPASE 20*   CBC:  Recent Labs  06/09/15 2010 06/10/15 0448  WBC 4.9 4.1  NEUTROABS  --  2.6  HGB 13.5 13.2  HCT 38.7 38.6  MCV 88.6 88.9  PLT 165 151   Cardiac Enzymes:  Recent Labs  06/09/15 2340 06/10/15 0437 06/10/15 1119  TROPONINI <0.03 0.06* 0.04*   BNP: No results for input(s): PROBNP in the last 72 hours. D-Dimer: No results for input(s): DDIMER in the last 72 hours. Hemoglobin A1C: No results for input(s): HGBA1C in the last 72 hours. Fasting Lipid Panel: No results for input(s): CHOL, HDL, LDLCALC, TRIG, CHOLHDL, LDLDIRECT in the last 72  hours. Thyroid Function Tests: No results for input(s): TSH, T4TOTAL, T3FREE, THYROIDAB in the last 72 hours.  Invalid input(s): FREET3   Radiology/Studies:  Dg Chest 2 View  06/09/2015   CLINICAL DATA:  Chest pain  EXAM: CHEST  2 VIEW  COMPARISON:  04/11/2015  FINDINGS: Lungs are clear.  No pleural effusion or pneumothorax.  The heart is normal in size.  Visualized osseous structures are within normal limits.  IMPRESSION: Normal chest radiographs.   Electronically Signed   By: Charline Bills M.D.   On: 06/09/2015 20:35   US Abdomen Complete  06/10/2015   CLINICAL DATA:  Chest pain and nausea.  EXAM: ULTRASOUND ABDOMEN COMPLETE  COMPARISON:  None.  FINDINGS: Gallbladder: No gallstones or wall thickening visualized. No sonographic Murphy sign noted.  Common bile duct: Diameter: 3.5 mm.  Liver: No focal lesion identified. Within normal limits in parenchymal echogenicity.  IVC: No abnormality visualized.  Pancreas: Visualized portion unremarkable.  Spleen: Size and appearance within normal limits.  Right Kidney: Length: 10.5 cm. Echogenicity within normal limits. No mass or hydronephrosis visualized.  Left Kidney: Length: 10.1 cm. Echogenicity within normal limits. No mass or hydronephrosis visualized.  Abdominal aorta: No aneurysm visualized.  Other findings: None.  IMPRESSION: No acute findings.   Electronically Signed   By: Elberta Fortisaniel  Boyle M.D.   On: 06/10/2015 12:58    PHYSICAL EXAM General: Well developed, well nourished, in no acute distress. Head: Normal Neck: Negative for carotid bruits. JVD not elevated. No adenopathy Lungs: Clear bilaterally to auscultation without wheezes, rales, or rhonchi. Breathing is unlabored. Heart: RRR S1 S2 without murmurs, rubs, or gallops.  Abdomen: Soft, non-tender, non-distended with normoactive bowel sounds. No hepatomegaly. No rebound/guarding. No obvious abdominal masses. Msk:  Strength and tone appears normal for age. Extremities: No clubbing, cyanosis  or edema.  Distal pedal pulses are 2+ and equal bilaterally. Neuro: Alert and oriented X 3. Moves all extremities spontaneously. Psych:  Responds to questions appropriately with a normal affect.  ASSESSMENT AND PLAN: 1. Chest pain. Pain is mid sternal and epigastric. Radiates to shoulders and shoots down arms. Some jaw pain. GI evaluation is negative. I suspect that there is a component of coronary vasospasm based on history and timing of pain at night and at rest. She is on isosorbide and amlodipine. Low BP limits titration. Application in process for patient assistance with Ranexa. She has made lifestyle modifications with smoking cessation and no cocaine. I would continue current therapy and be fairly liberal with use of sl NTG for acute pain. She has an appointment with Dr. Excell Seltzerooper later in July. OK for DC from our standpoint.   Present on Admission:  . Anxiety and depression . CAD (coronary artery disease) . Hyperlipemia . ACS (acute coronary syndrome) . Tobacco use disorder  Signed, Peter SwazilandJordan, MDFACC 06/11/2015 11:04 AM

## 2015-06-11 NOTE — Anesthesia Preprocedure Evaluation (Signed)
Anesthesia Evaluation  Patient identified by MRN, date of birth, ID band Patient awake    Reviewed: Allergy & Precautions, NPO status , Patient's Chart, lab work & pertinent test results  History of Anesthesia Complications Negative for: history of anesthetic complications  Airway Mallampati: II  TM Distance: >3 FB Neck ROM: Full    Dental  (+) Teeth Intact   Pulmonary neg shortness of breath, neg sleep apnea, neg COPDneg recent URI, Current Smoker,  breath sounds clear to auscultation        Cardiovascular + angina + CAD, + Past MI and + Cardiac Stents - CHF - dysrhythmias Rhythm:Regular     Neuro/Psych  Headaches, PSYCHIATRIC DISORDERS Anxiety Depression Bipolar Disorder TIA   GI/Hepatic negative GI ROS, Neg liver ROS,   Endo/Other  negative endocrine ROS  Renal/GU negative Renal ROS     Musculoskeletal  (+) Arthritis -,   Abdominal   Peds  Hematology negative hematology ROS (+)   Anesthesia Other Findings   Reproductive/Obstetrics                             Anesthesia Physical Anesthesia Plan  ASA: III  Anesthesia Plan: MAC   Post-op Pain Management:    Induction: Intravenous  Airway Management Planned: Nasal Cannula  Additional Equipment: None  Intra-op Plan:   Post-operative Plan:   Informed Consent: I have reviewed the patients History and Physical, chart, labs and discussed the procedure including the risks, benefits and alternatives for the proposed anesthesia with the patient or authorized representative who has indicated his/her understanding and acceptance.   Dental advisory given  Plan Discussed with: CRNA and Surgeon  Anesthesia Plan Comments:         Anesthesia Quick Evaluation

## 2015-06-11 NOTE — Op Note (Signed)
[facility] [address] [place], [address]   ENDOSCOPY PROCEDURE REPORT  PATIENT: Christina Morrison, Christina Morrison  MR#: [id number] BIRTHDATE: 06/09/1976 , 38  yrs. old GENDER: female ENDOSCOPIST:Hoyt Leanos, MD REFERRED BY: Georgann Housekeeper, MD PROCEDURE DATE:  06/20/2015 PROCEDURE:   upper endoscopy with biopsies ASA CLASS:    III INDICATIONS: chest pain which has recurred intermittently since the patient, despite her young age, had an MI with acute coronary stenting 2 months ago. MEDICATION: Mac, per anesthesia TOPICAL ANESTHETIC:   Cetacaine spray  DESCRIPTION OF PROCEDURE:   After the risks and benefits of the procedure were explained, informed consent was obtained. the patient was brought from her hospital room to the [facility] cone endoscopy unit. Time out was performed. The Pentax Gastroscope [id number]  endoscope was introduced through the mouth  and advanced to the second portion of the duodenum .  The instrument was slowly withdrawn as the mucosa was fully examined. Estimated blood loss is zero unless otherwise noted in this procedure report.    The larynx was not well seen during this exam.  The esophagus was entered without difficulty and had entirely normal mucosa. There was no evidence of reflux esophagitis, Barrett's esophagus, varices, infection, neoplasia, or Mallory-Weiss tear. No ring or stricture was appreciated. A 1-2 cm hiatal hernia was present.  The stomach contained a minimal clear residual. There were some slight antral erosions and patches of erythema consistent with the patient's use of aspirin. No gastric ulcers were seen. There was no diffuse gastritis. Retroflexed view of the cardia of the stomach was normal. The stomach as a whole was normal, apart from the above-mentioned mild aspirin-related gastritis.  The duodenum was normal.  no source of the patient's pain was endoscopically evident.       I did elect to take esophageal  biopsies (also had inadvertently obtained one biopsy from the proximal stomach) to look for histologic evidence of esophageal reflux disease.  The scope was then withdrawn from the patient and the procedure completed.  COMPLICATIONS: There were no immediate complications.  ENDOSCOPIC IMPRESSION: 1. No source of patient's chest pain endoscopically evident 2. Mild antral gastritis, slightly erosive in character, consistent with her use of aspirin 3. Minimal hiatal hernia RECOMMENDATIONS: 1. Empiric PPI therapy to take acid out of the equation 2. Await esophageal biopsies to look for evidence of reflux-related changes which were not endoscopically apparent   _______________________________ eSigned:  Bernette Redbird, MD 06-20-2015 9:05 AM     cc:  CPT CODES: ICD CODES:  The ICD and CPT codes recommended by this software are interpretations from the data that the clinical staff has captured with the software.  The verification of the translation of this report to the ICD and CPT codes and modifiers is the sole responsibility of the health care institution and practicing physician where this report was generated.  [employer] will not be held responsible for the validity of the ICD and CPT codes included on this report.  AMA assumes no liability for data contained or not contained herein. CPT is a Publishing rights manager of the [employer].  PATIENT NAME:  Christina Morrison MR#: [id number]

## 2015-06-11 NOTE — Consult Note (Signed)
Referring Provider: Crista Curb, MD ([facility]) Primary Care Physician:  Georgann Housekeeper, MD Primary Gastroenterologist:  None (unassigned)  Reason for Consultation:  Chest pain  HPI: Christina Morrison is a 39 y.o. female who sustained an MI back in April, about 2 months ago, and since that time has had intermittent chest pains, always awakening her at 2 in the morning and lasting for a a variable period of time, although on this occasion they have been lasting continuously for the past 2-3 days, albeit variable in severity. This has not been associated with EKG changes or significant rise in cardiac enzymes. Accordingly, we have been asked to do endoscopic evaluation to look for a possible GI cause of the symptoms.  The patient does not have classic esophageal symptoms. For example, she does not have any dysphagia when she is having the pain, to suggest esophageal spasm. She is not having any real heartburn. She has tried toms on multiple occasions and on only one occasion did she get relief from using them. She does not have burping, belching, or regurgitation. However, she has not been on PPI therapy as an outpatient.   Past Medical History  Diagnosis Date  . Depression   . Anxiety   . Bipolar 1 disorder   . Scoliosis   . Migraine   . Osteoarthritis   . Coronary artery disease     a. 03/2015 NSTEMI/PCI: LM 20ost, LAD 30-40p/m, D1 40-50ost, 90p (2.75x12 Vision BMS), LCX 50p/m, RCA 40-15m, EF 65-70% (UDS +for cocaine).  [name] TIA (transient ischemic attack)   . Polysubstance abuse     a. tobacco/cocaine    Past Surgical History  Procedure Laterality Date  . Coronary stent placement  03/21/2015    first diagonal  . Left heart catheterization with coronary angiogram N/A 03/21/2015    Procedure: LEFT HEART CATHETERIZATION WITH CORONARY ANGIOGRAM;  Surgeon: [name], MD;  Location: [facility] CATH LAB;  Service: Cardiovascular;  Laterality: N/A;  . Cardiac catheterization  03/21/2015     Procedure: CORONARY STENT INTERVENTION;  Surgeon: [name], MD;  Location: [facility] CATH LAB;  Service: Cardiovascular;;  diag bms 2.75 x 12 vision  . Left heart catheterization with coronary angiogram N/A 04/11/2015    Procedure: LEFT HEART CATHETERIZATION WITH CORONARY ANGIOGRAM;  Surgeon: Kathleene Hazel, MD;  Location: [facility] CATH LAB;  Service: Cardiovascular;  Laterality: N/A;    Prior to Admission medications   Medication Sig Start Date End Date Taking? Authorizing Provider  ALPRAZolam (XANAX) 0.25 MG tablet Take 1 tablet (0.25 mg total) by mouth 3 (three) times daily as needed for anxiety. 04/09/15  Yes Ok Anis, NP  aspirin EC 81 MG EC tablet Take 1 tablet (81 mg total) by mouth daily. 03/23/15  Yes Ok Anis, NP  buPROPion [facility] SR) 150 MG 12 hr tablet Take 1 tablet (150 mg total) by mouth 2 (two) times daily. Patient taking differently: Take 150-300 mg by mouth 2 (two) times daily. Takes 300mg  in am and 150mg  in pm 03/27/15  Yes Ok Anis, NP  clopidogrel (PLAVIX) 75 MG tablet Take 1 tablet (75 mg total) by mouth daily with breakfast. 03/23/15  Yes Ok Anis, NP  diltiazem (CARDIZEM LA) 120 MG 24 hr tablet Take 1 tablet (120 mg total) by mouth daily. 03/23/15  Yes Ok Anis, NP  fluticasone (FLONASE) 50 MCG/ACT nasal spray Place 2 sprays into both nostrils daily as needed for allergies or rhinitis.   Yes Historical Provider, MD  guaiFENesin (  MUCINEX) 600 MG 12 hr tablet Take 600 mg by mouth 2 (two) times daily as needed for cough or to loosen phlegm.   Yes Historical Provider, MD  isosorbide mononitrate (IMDUR) 60 MG 24 hr tablet Take 1 tablet (60 mg total) by mouth daily. 04/27/15  Yes Ok Anis, NP  nitroGLYCERIN (NITROSTAT) 0.4 MG SL tablet Place 1 tablet (0.4 mg total) under the tongue every 5 (five) minutes as needed for chest pain. 03/23/15  Yes Ok Anis, NP  oxycodone (OXY-IR) 5 MG capsule Take 5 mg by mouth every  4 (four) hours as needed for pain.   Yes Historical Provider, MD  pravastatin (PRAVACHOL) 40 MG tablet Take 1 tablet (40 mg total) by mouth daily. 03/23/15  Yes Ok Anis, NP  ranolazine (RANEXA) 500 MG 12 hr tablet Take 1 tablet (500 mg total) by mouth 2 (two) times daily. 05/19/15  Yes Brittainy Sherlynn Carbon, PA-C  fluconazole (DIFLUCAN) 150 MG tablet Take 1 tablet (150 mg total) by mouth once. Can take additional dose three days later if symptoms persist 04/25/15   Tereso Newcomer, MD  metroNIDAZOLE (FLAGYL) 500 MG tablet Take 1 tablet (500 mg total) by mouth 2 (two) times daily. 04/26/15   Tereso Newcomer, MD  oxyCODONE-acetaminophen (PERCOCET/ROXICET) 5-325 MG per tablet Take 1-2 tablets by mouth every 6 (six) hours as needed for severe pain (Back and knee pain). 04/12/15   Brittainy Sherlynn Carbon, PA-C    Current Facility-Administered Medications  Medication Dose Route Frequency Provider Last Rate Last Dose  . 0.9 %  sodium chloride infusion   Intravenous Continuous Bernette Redbird, MD      . Mitzi Hansen Hold] acetaminophen (TYLENOL) tablet 650 mg  650 mg Oral Q4H PRN Rolly Salter, MD   650 mg at 06/10/15 2139  . [MAR Hold] ALPRAZolam (XANAX) tablet 0.25 mg  0.25 mg Oral TID PRN Rolly Salter, MD   0.25 mg at 06/10/15 0851  . [MAR Hold] amLODipine (NORVASC) tablet 5 mg  5 mg Oral Daily Leone Brand, NP   5 mg at 06/10/15 1122  . [MAR Hold] aspirin EC tablet 81 mg  81 mg Oral Daily Rolly Salter, MD   81 mg at 06/10/15 0850  . [MAR Hold] buPROPion (WELLBUTRIN SR) 12 hr tablet 150 mg  150 mg Oral QHS Rolly Salter, MD   150 mg at 06/10/15 2135  . [MAR Hold] buPROPion (WELLBUTRIN SR) 12 hr tablet 300 mg  300 mg Oral Daily Rolly Salter, MD   300 mg at 06/10/15 0850  . [MAR Hold] clopidogrel (PLAVIX) tablet 75 mg  75 mg Oral Q breakfast Rolly Salter, MD   75 mg at 06/10/15 0851  . [MAR Hold] fluticasone (FLONASE) 50 MCG/ACT nasal spray 2 spray  2 spray Each Nare Daily PRN Rolly Salter, MD       . Mitzi Hansen Hold] heparin injection 5,000 Units  5,000 Units Subcutaneous 3 times per day Rolly Salter, MD   5,000 Units at 06/10/15 2135  . [MAR Hold] HYDROmorphone (DILAUDID) injection 0.5-1 mg  0.5-1 mg Intravenous Q3H PRN Rolly Salter, MD   1 mg at 06/10/15 2331  . [MAR Hold] isosorbide mononitrate (IMDUR) 24 hr tablet 90 mg  90 mg Oral Daily Leone Brand, NP   90 mg at 06/10/15 1245  . lactated ringers infusion   Intravenous Continuous Bernette Redbird, MD 10 mL/hr at 06/11/15 0808 1,000 mL at 06/11/15 0808  . [  MAR Hold] ondansetron (ZOFRAN) injection 4 mg  4 mg Intravenous Q6H PRN Rolly Salter, MD   4 mg at 06/10/15 0655  . [MAR Hold] pravastatin (PRAVACHOL) tablet 40 mg  40 mg Oral Daily Rolly Salter, MD   40 mg at 06/10/15 0850  . [MAR Hold] ranolazine (RANEXA) 12 hr tablet 500 mg  500 mg Oral BID Rolly Salter, MD   500 mg at 06/10/15 2135    Allergies as of 06/09/2015  . (No Known Allergies)    Family History  Problem Relation Age of Onset  . Mental retardation Mother   . Hypertension Mother   . Hyperlipidemia Mother   . Heart disease Mother   . Depression Mother   . Hypertension Father   . Hyperlipidemia Father   . Heart disease Father   . Mental retardation Father   . Diabetes Father   . Cancer Maternal Aunt   . Stroke Maternal Grandmother     History   Social History  . Marital Status: Divorced    Spouse Name: N/A  . Number of Children: N/A  . Years of Education: N/A   Occupational History  . Not on file.   Social History Main Topics  . Smoking status: Current Every Day Smoker -- 1.00 packs/day    Types: Cigarettes  . Smokeless tobacco: Never Used  . Alcohol Use: No     Comment: once a month  . Drug Use: Yes    Special: Cocaine  . Sexual Activity: Not on file   Other Topics Concern  . Not on file   Social History Narrative    Review of Systems: Positive for diffuse joint pains and history of back pain associated with scoliosis, also a  tendency towards some constipation and frequent urination. Otherwise negative for anorexia, weight loss, skin problems, lymph node enlargement, breathing difficulties or cough, GI symptoms (see history of present illness); she does indicate that the pain awakening her out of sleep is reminiscent of that which she had with her MI.  Physical Exam: Vital signs in last 24 hours: Temp:  [98.4 F (36.9 C)-99.8 F (37.7 C)] 99.8 F (37.7 C) (06/27 0755) Pulse Rate:  [80-90] 80 (06/27 0400) Resp:  [16-18] 18 (06/27 0755) BP: (88-180)/(51-116) 100/66 mmHg (06/27 0755) SpO2:  [96 %-99 %] 96 % (06/27 0755) Weight:  [68.085 kg (150 lb 1.6 oz)] 68.085 kg (150 lb 1.6 oz) (06/27 0400) Last BM Date: 06/09/15 General:   Alert,  Well-developed, well-nourished, pleasant and cooperative in NAD Head:  Normocephalic and atraumatic. Eyes:  Sclera clear, no icterus.   Conjunctiva pink. Mouth:   No ulcerations or lesions.  Oropharynx pink & moist. Neck:   No masses or thyromegaly. Lungs:  Clear throughout to auscultation.   No wheezes, crackles, or rhonchi. No evident respiratory distress. Heart:   Regular rate and rhythm; no murmurs, clicks, rubs,  or gallops. Abdomen:  Soft, nontender, nontympanitic, and nondistended. No masses, hepatosplenomegaly or ventral hernias noted. Normal bowel sounds, without bruits, guarding, or rebound.   Rectal:  Not performed   Msk:   Symmetrical without gross deformities. Pulses:  Normal radial pulse is noted. Extremities:   Without clubbing, cyanosis, or edema. Neurologic:  Alert and coherent;  grossly normal neurologically. Skin:  Intact without significant lesions or rashes. Cervical Nodes:  No significant cervical adenopathy. Psych:   Alert and cooperative. Normal mood and affect.  Intake/Output from previous day: 06/26 0701 - 06/27 0700 In: 1595 [P.O.:720; I.V.:875] Out: -  Intake/Output this shift:    Lab Results:  Recent Labs  06/09/15 2010 06/10/15 0448   WBC 4.9 4.1  HGB 13.5 13.2  HCT 38.7 38.6  PLT 165 151   BMET  Recent Labs  06/09/15 2010 06/10/15 0448  NA 137 138  K 4.4 3.8  CL 104 104  CO2 24 27  GLUCOSE 86 114*  BUN 9 10  CREATININE 0.77 0.82  CALCIUM 9.0 8.8*   LFT  Recent Labs  06/10/15 0448  PROT 6.4*  ALBUMIN 3.7  AST 15  ALT 16  ALKPHOS 78  BILITOT 0.6   PT/INR  Recent Labs  06/10/15 0448  LABPROT 14.9  INR 1.15    Studies/Results: Dg Chest 2 View  06/09/2015   CLINICAL DATA:  Chest pain  EXAM: CHEST  2 VIEW  COMPARISON:  04/11/2015  FINDINGS: Lungs are clear.  No pleural effusion or pneumothorax.  The heart is normal in size.  Visualized osseous structures are within normal limits.  IMPRESSION: Normal chest radiographs.   Electronically Signed   By: Charline BillsSriyesh  Krishnan M.D.   On: 06/09/2015 20:35   [place] Abdomen Complete  06/10/2015   CLINICAL DATA:  Chest pain and nausea.  EXAM: ULTRASOUND ABDOMEN COMPLETE  COMPARISON:  None.  FINDINGS: Gallbladder: No gallstones or wall thickening visualized. No sonographic Murphy sign noted.  Common bile duct: Diameter: 3.5 mm.  Liver: No focal lesion identified. Within normal limits in parenchymal echogenicity.  IVC: No abnormality visualized.  Pancreas: Visualized portion unremarkable.  Spleen: Size and appearance within normal limits.  Right Kidney: Length: 10.5 cm. Echogenicity within normal limits. No mass or hydronephrosis visualized.  Left Kidney: Length: 10.1 cm. Echogenicity within normal limits. No mass or hydronephrosis visualized.  Abdominal aorta: No aneurysm visualized.  Other findings: None.  IMPRESSION: No acute findings.   Electronically Signed   By: Elberta Fortisaniel  Boyle M.D.   On: 06/10/2015 12:58    Impression: Recurring chest pain, predominately nocturnal, etiology unclear. I wonder about the possibility of atypical reflux.  Plan: I do agree with the request to do endoscopic evaluation and have discussed the nature, purpose, and risks of the procedure  with the patient and her fianc, and they are agreeable to proceed. They understand that many causes of chest pain, both esophageal and not esophageal, would not be detectable endoscopically, but at least it's a starting point.     Cylis Ayars V  06/11/2015, 8:19 AM   Pager [phone] If no answer or after 5 PM call [phone]

## 2015-06-11 NOTE — Discharge Summary (Signed)
Physician Discharge Summary  Christina CascoKelly Riddle ZOX:[id number] DOB: Apr 06, 1976 DOA: 06/09/2015  PCP: Georgann HousekeeperHUSAIN,KARRAR, MD  Admit date: 06/09/2015 Discharge date: 06/11/2015  Time spent: 35 minutes  Recommendations for Outpatient Follow-up:  1. Asking for re-fills of narcotics- deferred to PCP  Discharge Diagnoses:  Principal Problem:   ACS (acute coronary syndrome) Active Problems:   Anxiety and depression   Tobacco use disorder   CAD (coronary artery disease)   Hyperlipemia   Chest pain at rest   RUQ abdominal pain   Discharge Condition: improved  Diet recommendation: cardiac  Filed Weights   06/10/15 0500 06/11/15 0400  Weight: 66.633 kg (146 lb 14.4 oz) 68.085 kg (150 lb 1.6 oz)    History of present illness:  Christina Morrison is a 39 y.o. 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 The patient is presenting with complaints of chest pain. The pain is located centrally and is radiating to both of her hands. The pain is burning with pressure-like sensation. She describes this pain is similar to her prior heart attack happened in April. She also describes this pain is worse than that pain. She denies any fever or chills denies any chest cough denies any shortness of breath at the time of my evaluation denies any diaphoresis denies any nausea or vomiting denies any abdominal pain denies any diarrhea and denies any constipation denies any leg tenderness or recent travel. She has chronic leg swelling which has been present since last one year and unchanged. She has quit smoking 2 weeks ago. She denies any alcohol abuse or drug abuse at present. She mentions she is compliant with all her medications. She has recently started taking Ranexa.  Hospital Course:  Chest pain- suspect vasospasm +/ acid reflux EGD done and ok- started on protonix BID Patient changed to Norvasc and  ranexa being arranged outpatient    Procedures:  EGD  Consultations:  GI  cards  Discharge Exam: Filed Vitals:   06/11/15 0930  BP:   Pulse: 81  Temp:   Resp: 16    General: A+Ox3, NAD   Discharge Instructions    Current Discharge Medication List    CONTINUE these medications which have NOT CHANGED   Details  ALPRAZolam (XANAX) 0.25 MG tablet Take 1 tablet (0.25 mg total) by mouth 3 (three) times daily as needed for anxiety. Qty: 21 tablet, Refills: 0    aspirin EC 81 MG EC tablet Take 1 tablet (81 mg total) by mouth daily.    buPROPion (WELLBUTRIN SR) 150 MG 12 hr tablet Take 1 tablet (150 mg total) by mouth 2 (two) times daily. Qty: 60 tablet, Refills: 3    clopidogrel (PLAVIX) 75 MG tablet Take 1 tablet (75 mg total) by mouth daily with breakfast. Qty: 30 tablet, Refills: 6    diltiazem (CARDIZEM LA) 120 MG 24 hr tablet Take 1 tablet (120 mg total) by mouth daily. Qty: 30 tablet, Refills: 6    fluticasone (FLONASE) 50 MCG/ACT nasal spray Place 2 sprays into both nostrils daily as needed for allergies or rhinitis.    guaiFENesin (MUCINEX) 600 MG 12 hr tablet Take 600 mg by mouth 2 (two) times daily as needed for cough or to loosen phlegm.    isosorbide mononitrate (IMDUR) 60 MG 24 hr tablet Take 1 tablet (60 mg total) by mouth daily. Qty: 90 tablet, Refills: 3    nitroGLYCERIN (NITROSTAT) 0.4 MG SL tablet Place  1 tablet (0.4 mg total) under the tongue every 5 (five) minutes as needed for chest pain. Qty: 25 tablet, Refills: 3    oxycodone (OXY-IR) 5 MG capsule Take 5 mg by mouth every 4 (four) hours as needed for pain.    pravastatin (PRAVACHOL) 40 MG tablet Take 1 tablet (40 mg total) by mouth daily. Qty: 30 tablet, Refills: 6    ranolazine (RANEXA) 500 MG 12 hr tablet Take 1 tablet (500 mg total) by mouth 2 (two) times daily. Qty: 60 tablet, Refills: 4    fluconazole (DIFLUCAN) 150 MG tablet Take 1 tablet (150 mg total) by mouth once. Can take  additional dose three days later if symptoms persist Qty: 1 tablet, Refills: 3   Associated Diagnoses: Vaginal yeast infection    metroNIDAZOLE (FLAGYL) 500 MG tablet Take 1 tablet (500 mg total) by mouth 2 (two) times daily. Qty: 14 tablet, Refills: 0   Associated Diagnoses: BV (bacterial vaginosis)    oxyCODONE-acetaminophen (PERCOCET/ROXICET) 5-325 MG per tablet Take 1-2 tablets by mouth every 6 (six) hours as needed for severe pain (Back and knee pain). Qty: 30 tablet, Refills: 0       No Known Allergies Follow-up Information    Follow up with HUSAIN,KARRAR, MD In 2 weeks.   Specialty:  Internal Medicine   Contact information:   [address] [place] [address] [phone]       Please follow up.   Why:  keep appointment with cardiology       The results of significant diagnostics from this hospitalization (including imaging, microbiology, ancillary and laboratory) are listed below for reference.    Significant Diagnostic Studies: Dg Chest 2 View  06/09/2015   CLINICAL DATA:  Chest pain  EXAM: CHEST  2 VIEW  COMPARISON:  04/11/2015  FINDINGS: Lungs are clear.  No pleural effusion or pneumothorax.  The heart is normal in size.  Visualized osseous structures are within normal limits.  IMPRESSION: Normal chest radiographs.   Electronically Signed   By: Charline Bills M.D.   On: 06/09/2015 20:35   US Abdomen Complete  06/10/2015   CLINICAL DATA:  Chest pain and nausea.  EXAM: ULTRASOUND ABDOMEN COMPLETE  COMPARISON:  None.  FINDINGS: Gallbladder: No gallstones or wall thickening visualized. No sonographic Murphy sign noted.  Common bile duct: Diameter: 3.5 mm.  Liver: No focal lesion identified. Within normal limits in parenchymal echogenicity.  IVC: No abnormality visualized.  Pancreas: Visualized portion unremarkable.  Spleen: Size and appearance within normal limits.  Right Kidney: Length: 10.5 cm. Echogenicity within normal limits. No mass or  hydronephrosis visualized.  Left Kidney: Length: 10.1 cm. Echogenicity within normal limits. No mass or hydronephrosis visualized.  Abdominal aorta: No aneurysm visualized.  Other findings: None.  IMPRESSION: No acute findings.   Electronically Signed   By: Elberta Fortis M.D.   On: 06/10/2015 12:58    Microbiology: Recent Results (from the past 240 hour(s))  MRSA PCR Screening     Status: None   Collection Time: 06/09/15 10:59 PM  Result Value Ref Range Status   MRSA by PCR NEGATIVE NEGATIVE Final    Comment:        The GeneXpert MRSA Assay (FDA approved for NASAL specimens only), is one component of a comprehensive MRSA colonization surveillance program. It is not intended to diagnose MRSA infection nor to guide or monitor treatment for MRSA infections.      Labs: Basic Metabolic Panel:  Recent Labs Lab 06/09/15  2010 06/10/15 0448  NA 137 138  K 4.4 3.8  CL 104 104  CO2 24 27  GLUCOSE 86 114*  BUN 9 10  CREATININE 0.77 0.82  CALCIUM 9.0 8.8*   Liver Function Tests:  Recent Labs Lab 06/10/15 0448  AST 15  ALT 16  ALKPHOS 78  BILITOT 0.6  PROT 6.4*  ALBUMIN 3.7    Recent Labs Lab 06/10/15 0448  LIPASE 20*   No results for input(s): AMMONIA in the last 168 hours. CBC:  Recent Labs Lab 06/09/15 2010 06/10/15 0448  WBC 4.9 4.1  NEUTROABS  --  2.6  HGB 13.5 13.2  HCT 38.7 38.6  MCV 88.6 88.9  PLT 165 151   Cardiac Enzymes:  Recent Labs Lab 06/09/15 2340 06/10/15 0437 06/10/15 1119  TROPONINI <0.03 0.06* 0.04*   BNP: BNP (last 3 results)  Recent Labs  06/09/15 2010  BNP 32.4    ProBNP (last 3 results) No results for input(s): PROBNP in the last 8760 hours.  CBG: No results for input(s): GLUCAP in the last 168 hours.     SignedMarlin Canary  [facility] 06/11/2015, 11:36 AM

## 2015-06-11 NOTE — Anesthesia Postprocedure Evaluation (Signed)
  Anesthesia Post-op Note  Patient: Christina Morrison  Procedure(s) Performed: Procedure(s): ESOPHAGOGASTRODUODENOSCOPY (EGD) WITH PROPOFOL (N/A)  Patient Location: Endoscopy Unit  Anesthesia Type:MAC  Level of Consciousness: awake  Airway and Oxygen Therapy: Patient Spontanous Breathing  Post-op Pain: none  Post-op Assessment: Post-op Vital signs reviewed, Patient's Cardiovascular Status Stable, Respiratory Function Stable, Patent Airway, No signs of Nausea or vomiting and Pain level controlled              Post-op Vital Signs: Reviewed and stable  Last Vitals:  Filed Vitals:   06/11/15 0930  BP:   Pulse: 81  Temp:   Resp: 16    Complications: No apparent anesthesia complications

## 2015-06-11 NOTE — Transfer of Care (Signed)
Immediate Anesthesia Transfer of Care Note  Patient: Christina Morrison  Procedure(s) Performed: Procedure(s): ESOPHAGOGASTRODUODENOSCOPY (EGD) WITH PROPOFOL (N/A)  Patient Location: PACU  Anesthesia Type:MAC  Level of Consciousness: awake, alert  and oriented  Airway & Oxygen Therapy: Patient Spontanous Breathing and Patient connected to nasal cannula oxygen  Post-op Assessment: Report given to RN, Post -op Vital signs reviewed and stable and Patient moving all extremities  Post vital signs: Reviewed and stable  Last Vitals:  Filed Vitals:   06/11/15 0900  BP:   Pulse: 85  Temp:   Resp: 20    Complications: No apparent anesthesia complications

## 2015-06-11 NOTE — Progress Notes (Signed)
Pt discharged to home, via wheel chair, medication education handouts given for Amlodipine, Imdur and protonix, condition stable, accompanied by significant other.

## 2015-06-12 ENCOUNTER — Encounter (HOSPITAL_COMMUNITY): Payer: Self-pay | Admitting: Gastroenterology

## 2015-06-25 ENCOUNTER — Ambulatory Visit: Payer: Medicaid Other

## 2015-06-27 ENCOUNTER — Other Ambulatory Visit: Payer: Self-pay

## 2015-06-27 MED ORDER — RANOLAZINE ER 500 MG PO TB12: 500.0000 mg | ORAL_TABLET | Freq: Two times a day (BID) | ORAL | Status: DC

## 2015-07-09 ENCOUNTER — Ambulatory Visit: Payer: No Typology Code available for payment source | Admitting: Cardiovascular Disease

## 2015-07-11 ENCOUNTER — Other Ambulatory Visit: Payer: Self-pay

## 2015-07-11 MED ORDER — PRAVASTATIN SODIUM 40 MG PO TABS: 40.0000 mg | ORAL_TABLET | Freq: Every day | ORAL | Status: DC

## 2015-07-11 MED ORDER — PANTOPRAZOLE SODIUM 40 MG PO TBEC: 40.0000 mg | DELAYED_RELEASE_TABLET | Freq: Two times a day (BID) | ORAL | Status: DC

## 2015-07-11 MED ORDER — ISOSORBIDE MONONITRATE ER 30 MG PO TB24: 90.0000 mg | ORAL_TABLET | Freq: Every day | ORAL | Status: DC

## 2015-07-11 MED ORDER — AMLODIPINE BESYLATE 5 MG PO TABS: 5.0000 mg | ORAL_TABLET | Freq: Every day | ORAL | Status: DC

## 2015-09-24 ENCOUNTER — Encounter: Payer: Self-pay | Admitting: Cardiovascular Disease

## 2015-09-24 ENCOUNTER — Ambulatory Visit: Payer: Medicaid Other | Admitting: Cardiovascular Disease

## 2015-09-24 ENCOUNTER — Ambulatory Visit (INDEPENDENT_AMBULATORY_CARE_PROVIDER_SITE_OTHER): Payer: Self-pay | Admitting: Cardiovascular Disease

## 2015-09-24 VITALS — BP 90/54 | HR 71 | Ht 61.0 in | Wt 147.1 lb

## 2015-09-24 DIAGNOSIS — I2583 Coronary atherosclerosis due to lipid rich plaque: Secondary | ICD-10-CM

## 2015-09-24 DIAGNOSIS — E785 Hyperlipidemia, unspecified: Secondary | ICD-10-CM

## 2015-09-24 DIAGNOSIS — I2510 Atherosclerotic heart disease of native coronary artery without angina pectoris: Secondary | ICD-10-CM

## 2015-09-24 MED ORDER — AMLODIPINE BESYLATE 5 MG PO TABS: 5.0000 mg | ORAL_TABLET | Freq: Every day | ORAL | Status: DC

## 2015-09-24 MED ORDER — PRAVASTATIN SODIUM 40 MG PO TABS: 40.0000 mg | ORAL_TABLET | Freq: Every day | ORAL | Status: DC

## 2015-09-24 MED ORDER — ISOSORBIDE MONONITRATE ER 30 MG PO TB24: 90.0000 mg | ORAL_TABLET | Freq: Every day | ORAL | Status: DC

## 2015-09-24 MED ORDER — CLOPIDOGREL BISULFATE 75 MG PO TABS: 75.0000 mg | ORAL_TABLET | Freq: Every day | ORAL | Status: DC

## 2015-09-24 MED ORDER — RANOLAZINE ER 500 MG PO TB12: 500.0000 mg | ORAL_TABLET | Freq: Two times a day (BID) | ORAL | Status: DC

## 2015-09-24 MED ORDER — PANTOPRAZOLE SODIUM 40 MG PO TBEC
40.0000 mg | DELAYED_RELEASE_TABLET | Freq: Two times a day (BID) | ORAL | Status: DC
Start: 2015-09-24 — End: 2016-07-20

## 2015-09-24 NOTE — Progress Notes (Signed)
Cardiology Office Note Date:  09/24/2015   ID:  Christina Morrison, DOB 1976-02-23, MRN [id number]  PCP:  Georgann Housekeeper, MD  Cardiologist:  [name], MD    No chief complaint on file.   History of Present Illness: Christina Morrison is a 39 y.o. female who presents for follow-up evaluation. The patient presents for follow-up of CAD. She had NSTEMI in April 2016 associated with cocaine use. Found to have severe diagonal stenosis at cath and underwent PCI (bare metal stent). Repeat cath was done later that month for recurrent angina with stable coronary anatomy. She does have moderate LCx stenosis. In retrospect, she remembers that she had forgotten to take isosorbide for a week prior to this second admission.   She has had to use NTG on occasion for recurrence of chest pain. Not exercising. Chest pain occurs most commonly when she first wakes up. Also can occur with brisk walking. Still smoking 3 cigarettes/day. Has multiple complaints as outlined in the ROS but overall feels like everything is stable. Hasn't appreciated any worsening of anginal symptoms.   Past Medical History  Diagnosis Date  . Depression   . Anxiety   . Bipolar 1 disorder (HCC)   . Scoliosis   . Migraine   . Osteoarthritis   . Coronary artery disease     a. 03/2015 NSTEMI/PCI: LM 20ost, LAD 30-40p/m, D1 40-50ost, 90p (2.75x12 Vision BMS), LCX 50p/m, RCA 40-20m, EF 65-70% (UDS +for cocaine).  [name] TIA (transient ischemic attack)   . Polysubstance abuse     a. tobacco/cocaine    Past Surgical History  Procedure Laterality Date  . Coronary stent placement  03/21/2015    first diagonal  . Left heart catheterization with coronary angiogram N/A 03/21/2015    Procedure: LEFT HEART CATHETERIZATION WITH CORONARY ANGIOGRAM;  Surgeon: [name], MD;  Location: [facility] CATH LAB;  Service: Cardiovascular;  Laterality: N/A;  . Cardiac catheterization  03/21/2015    Procedure: CORONARY STENT INTERVENTION;  Surgeon: [name], MD;   Location: [facility] CATH LAB;  Service: Cardiovascular;;  diag bms 2.75 x 12 vision  . Left heart catheterization with coronary angiogram N/A 04/11/2015    Procedure: LEFT HEART CATHETERIZATION WITH CORONARY ANGIOGRAM;  Surgeon: Kathleene Hazel, MD;  Location[facility] C[facility] LAB;  Service: Cardiovascular;  Laterality: N/A;  . Esophagogastroduodenoscopy (egd) with propofol N/A 06/11/2015    Procedure: ESOPHAGOGASTRODUODENOSCOPY (EGD) WITH PROPOFOL;  Surgeon: Bernette Redbird, MD;  Location: [facility] ENDOSCOPY;  Service: Endoscopy;  Laterality: N/A;    Current Outpatient Prescriptions  Medication Sig Dispense Refill  . ALPRAZolam (XANAX) 0.25 MG tablet Take 1 tablet (0.25 mg total) by mouth 3 (three) times daily as needed for anxiety. 21 tablet 0  . amLODipine (NORVASC) 5 MG tablet Take 1 tablet (5 mg total) by mouth daily. 30 tablet 2  . aspirin EC 81 MG EC tablet Take 1 tablet (81 mg total) by mouth daily.    [name] buPROPion (WELLBUTRIN SR) 150 MG 12 hr tablet Take 1-2 tablets (150-300 mg total) by mouth 2 (two) times daily. Takes  in am and  in pm 60 tablet 3  . clopidogrel (PLAVIX) 75 MG tablet Take 1 tablet (75 mg total) by mouth daily with breakfast. 30 tablet 6  . escitalopram (LEXAPRO) 10 MG tablet Take 10 mg by mouth daily.    . fluticasone (FLONASE) 50 MCG/ACT nasal spray Place 2 sprays into both nostrils daily as needed for allergies or rhinitis.    [name] guaiFENesin (MUCINEX) 600 MG 12  hr tablet Take 600 mg by mouth 2 (two) times daily as needed for cough or to loosen phlegm.    . isosorbide mononitrate (IMDUR) 30 MG 24 hr tablet Take 3 tablets (90 mg total) by mouth daily. 90 tablet 2  . nitroGLYCERIN (NITROSTAT) 0.4 MG SL tablet Place 1 tablet (0.4 mg total) under the tongue every 5 (five) minutes as needed for chest pain. 25 tablet 3  . oxyCODONE-acetaminophen (PERCOCET/ROXICET) 5-325 MG per tablet Take 1-2 tablets by mouth every 6 (six) hours as needed for severe pain (Back and knee pain). 30  tablet 0  . pantoprazole (PROTONIX) 40 MG tablet Take 1 tablet (40 mg total) by mouth 2 (two) times daily before a meal. 60 tablet 2  . pravastatin (PRAVACHOL) 40 MG tablet Take 1 tablet (40 mg total) by mouth daily. 30 tablet 2  . ranolazine (RANEXA) 500 MG 12 hr tablet Take 1 tablet (500 mg total) by mouth 2 (two) times daily. 180 tablet 3   No current facility-administered medications for this visit.    Allergies:   Review of patient's allergies indicates no known allergies.   Social History:  The patient  reports that she has been smoking Cigarettes.  She has been smoking about 1.00 pack per day. She has never used smokeless tobacco. She reports that she uses illicit drugs (Cocaine). She reports that she does not drink alcohol.   Family History:  The patient's family history includes Cancer in her maternal aunt; Depression in her mother; Diabetes in her father; Heart disease in her father and mother; Hyperlipidemia in her father and mother; Hypertension in her father and mother; Mental retardation in her father and mother; Stroke in her maternal grandmother.    ROS:  Please see the history of present illness.  Otherwise, review of systems is positive for appetite change, chills, chest pain, shortness of breath lying down, shortness of breath with activity, depression, back pain, dizziness, easy bruising, fatigue, sweating, skipped heartbeats, nausea, anxiety, difficulty urinating, joint swelling, headaches.  All other systems are reviewed and negative.   PHYSICAL EXAM: VS:  BP 90/54 mmHg  Pulse 71  Ht 5\' 1"  (1.549 m)  Wt 147 lb 1.9 oz (66.733 kg)  BMI 27.81 kg/m2 , BMI Body mass index is 27.81 kg/(m^2). GEN: Well nourished, well developed, in no acute distress HEENT: normal Neck: no JVD, no masses. No carotid bruits Cardiac: RRR without murmur or gallop                Respiratory:  clear to auscultation bilaterally, normal work of breathing GI: soft, nontender, nondistended, + BS MS:  no deformity or atrophy Ext: no pretibial edema, pedal pulses 2+= bilaterally Skin: warm and dry, no rash Neuro:  Strength and sensation are intact Psych: euthymic mood, full affect  EKG:  EKG is ordered today. The ekg ordered today shows NSR 71 bpm, within normal limits  Recent Labs: 04/11/2015: Magnesium 1.7; TSH 1.129 06/09/2015: B Natriuretic Peptide 32.4 06/10/2015: ALT 16; BUN 10; Creatinine, Ser 0.82; Hemoglobin 13.2; Platelets 151; Potassium 3.8; Sodium 138   Lipid Panel     Component Value Date/Time   CHOL 154 03/22/2015 0145   TRIG 119 03/22/2015 0145   HDL 38* 03/22/2015 0145   CHOLHDL 4.1 03/22/2015 0145   VLDL 24 03/22/2015 0145   LDLCALC 92 03/22/2015 0145      Wt Readings from Last 3 Encounters:  09/24/15 147 lb 1.9 oz (66.733 kg)  06/11/15 150 lb 1.6 oz (68.085  kg)  04/25/15 146 lb 6.4 oz (66.407 kg)     ASSESSMENT AND PLAN: 1.  CAD, native vessel, with angina CCS Class II. No room to titrate anti-anginal Rx. Continue rx with amlodipine, imdur, and ranexa. Consider add beta-blocker in future depending on BP trend. At this point BP is too low for additive rx. Follow-up 3 months with APP and I will see back in 6 months.  2. Tobacco abuse: cessation counseling done. She understands this is a key component of reducing risk of recurrence.  3. Polysubstance abuse: improved. Seems to be compliant with medical therapy and avoiding cocaine and other drugs.  4. Hyperlipidemia: continue pravastatin and repeat lipids in 3 months  Current medicines are reviewed with the patient today.  The patient does not have concerns regarding medicines.  Labs/ tests ordered today include:  No orders of the defined types were placed in this encounter.    Disposition:   FU 3 months with APP and 6 months with me. Lipid panel, CMET in 3 months  Signed, [name], MD  09/24/2015 11:39 AM    [facility] [address] [place], [place], [place]  [address] Phone:  [phone]; Fax: [phone]

## 2015-09-24 NOTE — Patient Instructions (Signed)
Medication Instructions:  Your physician recommends that you continue on your current medications as directed. Please refer to the Current Medication list given to you today.  Labwork: Your physician recommends that you return for a FASTING BMP and LIPID in 3 MONTHS--nothing to eat or drink after midnight, lab opens at 7:30 AM.   Testing/Procedures: No new orders.   Follow-Up: Your physician recommends that you schedule a follow-up appointment in: 3 MONTHS with PA/NP  Your physician wants you to follow-up in: 6 MONTHS with Dr Excell Seltzer. You will receive a reminder letter in the mail two months in advance. If you don't receive a letter, please call our office to schedule the follow-up appointment.   Any Other Special Instructions Will Be Listed Below (If Applicable).

## 2015-12-11 ENCOUNTER — Telehealth: Payer: Self-pay | Admitting: Cardiovascular Disease

## 2015-12-11 NOTE — Telephone Encounter (Signed)
New problem   Pt need to speak to you concerning a letter she need to get medicaid. Please advise.

## 2015-12-11 NOTE — Telephone Encounter (Signed)
I spoke with the Christina Morrison and she applied for Medicaid and was denied (Christina Morrison is not eligible for [employer]).  Currently the Christina Morrison is appealing the denial and needs a letter from Dr Excell Seltzerooper in regards to her cardiac status.  The letter needs to contain her cardiac history and that she will require ongoing treatment, appointments and medication for more than 1 year. The Christina Morrison is unsure if Dr Excell Seltzerooper can include information about the Christina Morrison not being about to work due to mental illness.  The Christina Morrison is getting documentation from her psychiatrist as well in regards to those issues.  I will forward this information to Dr Excell Seltzerooper to review.

## 2015-12-20 ENCOUNTER — Telehealth: Payer: Self-pay | Admitting: Cardiovascular Disease

## 2015-12-20 ENCOUNTER — Other Ambulatory Visit (INDEPENDENT_AMBULATORY_CARE_PROVIDER_SITE_OTHER): Payer: Self-pay | Admitting: *Deleted

## 2015-12-20 DIAGNOSIS — I2583 Coronary atherosclerosis due to lipid rich plaque: Principal | ICD-10-CM

## 2015-12-20 DIAGNOSIS — I2510 Atherosclerotic heart disease of native coronary artery without angina pectoris: Secondary | ICD-10-CM

## 2015-12-20 DIAGNOSIS — E785 Hyperlipidemia, unspecified: Secondary | ICD-10-CM

## 2015-12-20 NOTE — Telephone Encounter (Signed)
Walk in pt form- pt has question- gave to [employer] MillsLauren

## 2015-12-20 NOTE — Addendum Note (Signed)
Addended by: Chela Sutphen K on: 12/20/2015 07:50 AM   Modules accepted: Orders  

## 2015-12-20 NOTE — Addendum Note (Signed)
Addended by: Tonita PhoenixBOWDEN, Anali Cabanilla K on: 12/20/2015 12:18 PM   Modules accepted: Orders

## 2015-12-20 NOTE — Addendum Note (Signed)
Addended by: Tonita PhoenixBOWDEN, ROBIN K on: 12/20/2015 07:50 AM   Modules accepted: Orders

## 2015-12-21 LAB — BASIC METABOLIC PANEL
BUN: 11 mg/dL (ref 7–25)
CHLORIDE: 104 mmol/L (ref 98–110)
CO2: 25 mmol/L (ref 20–31)
Calcium: 9.5 mg/dL (ref 8.6–10.2)
Creat: 0.79 mg/dL (ref 0.50–1.10)
GLUCOSE: 98 mg/dL (ref 65–99)
POTASSIUM: 4.1 mmol/L (ref 3.5–5.3)
Sodium: 138 mmol/L (ref 135–146)

## 2015-12-21 LAB — LIPID PANEL
Cholesterol: 160 mg/dL (ref 125–200)
HDL: 39 mg/dL — ABNORMAL LOW (ref >=46)
LDL CALC: 102 mg/dL (ref <130)
Total CHOL/HDL Ratio: 4.1 Ratio (ref <=5.0)
Triglycerides: 95 mg/dL (ref <150)
VLDL: 19 mg/dL (ref <30)

## 2015-12-25 ENCOUNTER — Ambulatory Visit (INDEPENDENT_AMBULATORY_CARE_PROVIDER_SITE_OTHER): Payer: Self-pay | Admitting: Nurse Practitioner

## 2015-12-25 ENCOUNTER — Encounter: Payer: Self-pay | Admitting: Nurse Practitioner

## 2015-12-25 VITALS — BP 112/68 | HR 76 | Ht 61.0 in | Wt 150.4 lb

## 2015-12-25 DIAGNOSIS — I259 Chronic ischemic heart disease, unspecified: Secondary | ICD-10-CM

## 2015-12-25 MED ORDER — RANOLAZINE ER 1000 MG PO TB12: 1000.0000 mg | ORAL_TABLET | Freq: Two times a day (BID) | ORAL | Status: DC

## 2015-12-25 MED ORDER — ATORVASTATIN CALCIUM 20 MG PO TABS: 20.0000 mg | ORAL_TABLET | Freq: Every day | ORAL | Status: DC

## 2015-12-25 MED FILL — ?ATORVASTATIN 20 MG TABLET: 20 | 30 days supply | Qty: 30 | Fill #0

## 2015-12-25 NOTE — Progress Notes (Signed)
CARDIOLOGY OFFICE NOTE  Date:  12/25/2015    Christina Morrison Date of Birth: 08-Jul-1976 Medical Record #[id number]  PCP:  Georgann HousekeeperHUSAIN,KARRAR, MD  Cardiologist:  Excell Seltzerooper    Chief Complaint  Patient presents with  . Coronary Artery Disease    3 month check - seen for Dr. Excell Seltzerooper    History of Present Illness: Christina Morrison is a 40 y.o. female who presents today for a follow up visit. Seen for Dr. Excell Seltzerooper.   She has known CAD. She had NSTEMI in April 2016 associated with cocaine use. Found to have severe diagonal stenosis at cath and underwent PCI (bare metal stent). Repeat cath was done later that month for recurrent angina (most likely due to missing her long acting nitrate therapy for a week) with stable coronary anatomy. She does have moderate LCx stenosis.   Last seen here back in October. Still smoking. Some episodes of chest pain. Occasionally using NTG. Overall, felt to be stable.   Comes back today. Here with her niece today (whose dad is at the hospital getting a stent). Reports having had chest pain. Says she is having more frequent spells since her last visit here. Still smoking. We did not discuss drug use today. She says she is taking her medicines. Her chest pain will wake her up and occur with exertion. Feels heavy and she will be short of breath. Has used probably 10 NTG over this past month. She is trying to get her disability and needs letter describing her situation. She is working with an Pensions consultantattorney.     Past Medical History  Diagnosis Date  . Depression   . Anxiety   . Bipolar 1 disorder (HCC)   . Scoliosis   . Migraine   . Osteoarthritis   . Coronary artery disease     a. 03/2015 NSTEMI/PCI: LM 20ost, LAD 30-40p/m, D1 40-50ost, 90p (2.75x12 Vision BMS), LCX 50p/m, RCA 40-6934m, EF 65-70% (UDS +for cocaine).  [name]. TIA (transient ischemic attack)   . Polysubstance abuse     a. tobacco/cocaine    Past Surgical History  Procedure Laterality Date  . Coronary stent  placement  03/21/2015    first diagonal  . Left heart catheterization with coronary angiogram N/A 03/21/2015    Procedure: LEFT HEART CATHETERIZATION WITH CORONARY ANGIOGRAM;  Surgeon: Tonny BollmanMichael Cooper, MD;  [facility];  Service: Cardiovascular;  Laterality: N/A;  . Cardiac catheterization  03/21/2015    Procedure: CORONARY STENT INTERVENTION;  Surgeon: Tonny BollmanMichael Cooper, MD;  [facility];  Service: Cardiovascular;;  diag bms 2.75 x 12 vision  . Left heart catheterization with coronary angiogram N/A 04/11/2015    Procedure: LEFT HEART CATHETERIZATION WITH CORONARY ANGIOGRAM;  Surgeon: Kathleene Hazelhristopher D McAlhany, MD;  [facility];  Service: Cardiovascular;  Laterality: N/A;  . Esophagogastroduodenoscopy (egd) with propofol N/A 06/11/2015    Procedure: ESOPHAGOGASTRODUODENOSCOPY (EGD) WITH PROPOFOL;  Surgeon: Bernette Redbirdobert Buccini, MD;  [facility];  Service: Endoscopy;  Laterality: N/A;     Medications: Current Outpatient Prescriptions  Medication Sig Dispense Refill  . ALPRAZolam (XANAX) 0.5 MG tablet Take 0.5 mg by mouth 3 (three) times daily.    [name]. amLODipine (NORVASC) 5 MG tablet Take 1 tablet (5 mg total) by mouth daily. 90 tablet 2  . aspirin EC 81 MG EC tablet Take 1 tablet (81 mg total) by mouth daily.    [name]. buPROPion (WELLBUTRIN SR) 150 MG 12 hr tablet Take 1-2 tablets (150-300 mg total) by mouth  2 (two) times daily. Takes 300mg  in am and 150mg  in pm 60 tablet 3  . clopidogrel (PLAVIX) 75 MG tablet Take 1 tablet (75 mg total) by mouth daily with breakfast. 90 tablet 2  . escitalopram (LEXAPRO) 10 MG tablet Take 10 mg by mouth daily.    . fluticasone (FLONASE) 50 MCG/ACT nasal spray Place 2 sprays into both nostrils daily as needed for allergies or rhinitis.    [name] guaiFENesin (MUCINEX) 600 MG 12 hr tablet Take 600 mg by mouth 2 (two) times daily as needed for cough or to loosen phlegm.    . isosorbide mononitrate (IMDUR) 30 MG 24 hr tablet Take 3 tablets (90 mg total)  by mouth daily. 270 tablet 2  . nitroGLYCERIN (NITROSTAT) 0.4 MG SL tablet Place 1 tablet (0.4 mg total) under the tongue every 5 (five) minutes as needed for chest pain. 25 tablet 3  . Oxycodone HCl 10 MG TABS Take 10 mg by mouth 3 (three) times daily as needed.    . pantoprazole (PROTONIX) 40 MG tablet Take 1 tablet (40 mg total) by mouth 2 (two) times daily before a meal. 180 tablet 2  . pravastatin (PRAVACHOL) 40 MG tablet Take 1 tablet (40 mg total) by mouth daily. 90 tablet 2  . ranolazine (RANEXA) 500 MG 12 hr tablet Take 1 tablet (500 mg total) by mouth 2 (two) times daily. 180 tablet 2   No current facility-administered medications for this visit.    Allergies: No Known Allergies  Social History: The patient  reports that she has been smoking Cigarettes.  She has been smoking about 1.00 pack per day. She has never used smokeless tobacco. She reports that she uses illicit drugs (Cocaine). She reports that she does not drink alcohol.   Family History: The patient's family history includes Cancer in her maternal aunt; Depression in her mother; Diabetes in her father; Heart disease in her father and mother; Hyperlipidemia in her father and mother; Hypertension in her father and mother; Mental retardation in her father and mother; Stroke in her maternal grandmother.   Review of Systems: Please see the history of present illness.   Otherwise, the review of systems is positive for none.   All other systems are reviewed and negative.   Physical Exam: VS:  BP 112/68 mmHg  Pulse 76  Ht 5\' 1"  (1.549 m)  Wt 150 lb 6.4 oz (68.221 kg)  BMI 28.43 kg/m2 .  BMI Body mass index is 28.43 kg/(m^2).  Wt Readings from Last 3 Encounters:  12/25/15 150 lb 6.4 oz (68.221 kg)  09/24/15 147 lb 1.9 oz (66.733 kg)  06/11/15 150 lb 1.6 oz (68.085 kg)    General: Pleasant. She is alert and in no acute distress.  HEENT: Normal. Neck: Supple, no JVD, carotid bruits, or masses noted.  Cardiac: Regular  rate and rhythm. No murmurs, rubs, or gallops. No edema.  Respiratory:  Lungs are clear to auscultation bilaterally with normal work of breathing.  GI: Soft and nontender.  MS: No deformity or atrophy. Gait and ROM intact. Skin: Warm and dry. Color is normal.  Neuro:  Strength and sensation are intact and no gross focal deficits noted.  Psych: Alert, appropriate and with normal affect.   LABORATORY DATA:  EKG:  EKG is ordered today. This shows NSR with no changes.   Lab Results  Component Value Date   WBC 4.1 06/10/2015   HGB 13.2 06/10/2015   HCT 38.6 06/10/2015   PLT 151 06/10/2015  GLUCOSE 98 12/20/2015   CHOL 160 12/20/2015   TRIG 95 12/20/2015   HDL 39* 12/20/2015   LDLCALC 102 12/20/2015   ALT 16 06/10/2015   AST 15 06/10/2015   NA 138 12/20/2015   K 4.1 12/20/2015   CL 104 12/20/2015   CREATININE 0.79 12/20/2015   BUN 11 12/20/2015   CO2 25 12/20/2015   TSH 1.129 04/11/2015   INR 1.15 06/10/2015   HGBA1C 5.2 04/11/2015    BNP (last 3 results)  Recent Labs  06/09/15 2010  BNP 32.4    ProBNP (last 3 results) No results for input(s): PROBNP in the last 8760 hours.   Other Studies Reviewed Today:  Echo Study Conclusions 03/2015  - Left ventricle: The cavity size was normal. Wall thickness was normal. Systolic function was normal. The estimated ejection fraction was in the range of 60% to 65%. Wall motion was normal; there were no regional wall motion abnormalities. - Aortic valve: There was trivial regurgitation. - Mitral valve: There was trivial regurgitation. - Atrial septum: No defect or patent foramen ovale was identified. - Tricuspid valve: There was trivial regurgitation.  Angiographic Findings from 03/2015:  Left main: Normal caliber vessel that divides into the LAD and left circumflex.. Ostial 20% stenosis.   Left Anterior Descending Artery: Large caliber vessel that courses to the apex. There is a focal 20% mid stenosis at the  takeoff of the first diagonal branch and a 30-40% stenosis in the mid LAD at the takeoff of the septal perforating branch. The first diagonal branch is a moderate caliber vessel with ostial 30% stenosis and a patent stent in the mid vessel with no restenosis.   Circumflex Artery: Moderate caliber vessel with small caliber obtuse marginal branch. The proximal vessel has diffuse 50% stenosis. The mid AV groove Circumflex has a 50% stenosis beyond the bifurcation of the first obtuse marginal branch. The first obtuse marginal branch has an ostial 50% stenosis. Of note, the vessel was noted to have diffuse vasospasm throughout that responded to IC NTG. The disease in the is vessel is unchanged from last cath and does not appear to be flow limiting.   Right Coronary Artery: Moderate caliber dominant vessel. The mid RCA has 40% stenosis. The distal vessel has a focal 30% stenosis. The PDA and PLA branches are patent. 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   Left Ventricular Angiogram: LVEF=65%.   Impression: 1. Double vessel CAD with patent stent in the diagonal, moderate disease in the Circumflex that is unchanged from the prior cath 3 weeks ago.  2. Coronary vasospasm noted during the catheterization 3. Normal LV systolic function  Recommendations: Continue medical management. Complete smoking cessation recommended with evidence of coronary vasospasm. Continue calcium channel blocker and long acting nitrate.      Assessment/Plan: 1. CAD, native vessel, with angina CCS Class II/III.  BP remains too low for additive rx with beta blocker therapy but may need to try in the future along with liberalization of her salt use. I am increasing her Ranexa to 1000 mg BID - will need to help with the assistance forms. I don't think we will see BP drop with this. See back in 2 weeks with Dr. Excell Seltzer. Reminded again that we  need total tobacco cessation - she says one pack is now lasting 3 to 4 days whereas it used to be one pack a day. See back  in 2 weeks.   2. Tobacco abuse: cessation counseling done again. She understands this is a key component of reducing risk of recurrence.  3. Polysubstance abuse: this was not discussed due to young family member in the room.  4. HLD - switching to Lipitor per Dr. Earmon Phoenix recommendation.    Current medicines are reviewed with the patient today.  The patient does not have concerns regarding medicines other than what has been noted above.  The following changes have been made:  See above.  Labs/ tests ordered today include:   No orders of the defined types were placed in this encounter.     Disposition:   FU with me and Dr. Excell Seltzer in 2 weeks.   Patient is agreeable to this plan and will call if any problems develop in the interim.   Signed: Rosalio Macadamia, RN, ANP-C 12/25/2015 2:09 PM  [facility] [address] [place] [address] [place], [place]  [address] Phone: [phone] Fax: [phone]

## 2015-12-25 NOTE — Patient Instructions (Addendum)
We will be checking the following labs today - NONE   Medication Instructions:    Continue with your current medicines. BUT  I am increasing your Ranexa to 1000 mg twice a day - we will try to give you samples  I am stopping your Pravachol  I am starting you on Atorvastatin 20 mg to take once a day - this has been sent to your drug store. This will replace the Pravachol    Testing/Procedures To Be Arranged:  N/A  Follow-Up:   See me in about 2 weeks on a day that Dr. Excell Seltzerooper is here.     Other Special Instructions:   Keep working on your smoking!    If you need a refill on your cardiac medications before your next appointment, please call your pharmacy.   Call the [facility] office at [phone] if you have any questions, problems or concerns.

## 2015-12-26 ENCOUNTER — Encounter: Payer: Self-pay | Admitting: Cardiovascular Disease

## 2015-12-26 NOTE — Telephone Encounter (Signed)
Dr Excell Seltzerooper completed note for pt.  Note placed at the front desk for pick-up and the pt is aware.

## 2015-12-27 ENCOUNTER — Telehealth: Payer: Self-pay

## 2015-12-27 NOTE — Telephone Encounter (Signed)
Application forms for Ranexa Patient Assistance mailed to patient today.

## 2015-12-28 ENCOUNTER — Telehealth: Payer: Self-pay | Admitting: Cardiovascular Disease

## 2015-12-28 ENCOUNTER — Other Ambulatory Visit: Payer: Self-pay

## 2015-12-28 NOTE — Telephone Encounter (Signed)
NEW rx for Ranexa 1000mg  ER bid called into [facility]. #180/ 3 refills.

## 2015-12-28 NOTE — Telephone Encounter (Signed)
°*  STAT* If patient is at the pharmacy, call can be transferred to refill team.   1. Which medications need to be refilled? (please list name of each medication and dose if known) Renexa 1000mg    2. Which pharmacy/location (including street and city if local pharmacy) is medication to be sent to?pt don't know name of pharmacy but this is the number [phone] 3. Do they need a 30 day or 90 day supply? 90day

## 2015-12-28 NOTE — Telephone Encounter (Signed)
DONE. NEW RX SENT TO [facility].

## 2016-01-01 MED FILL — ?AMLODIPINE BESYLATE 5 MG T: 5 | 30 days supply | Qty: 30 | Fill #3

## 2016-01-01 MED FILL — ?ESCITALOPRAM 10 MG TABLET: 10 | 30 days supply | Qty: 30 | Fill #6

## 2016-01-04 ENCOUNTER — Encounter: Payer: Self-pay | Admitting: Nurse Practitioner

## 2016-01-04 ENCOUNTER — Ambulatory Visit (INDEPENDENT_AMBULATORY_CARE_PROVIDER_SITE_OTHER): Payer: Self-pay | Admitting: Nurse Practitioner

## 2016-01-04 ENCOUNTER — Telehealth: Payer: Self-pay | Admitting: Nurse Practitioner

## 2016-01-04 VITALS — BP 100/70 | HR 76 | Ht 61.0 in | Wt 149.8 lb

## 2016-01-04 DIAGNOSIS — I259 Chronic ischemic heart disease, unspecified: Secondary | ICD-10-CM

## 2016-01-04 DIAGNOSIS — F1910 Other psychoactive substance abuse, uncomplicated: Secondary | ICD-10-CM

## 2016-01-04 DIAGNOSIS — E785 Hyperlipidemia, unspecified: Secondary | ICD-10-CM

## 2016-01-04 DIAGNOSIS — R079 Chest pain, unspecified: Secondary | ICD-10-CM

## 2016-01-04 NOTE — Telephone Encounter (Signed)
New message     Pt c/o medication issue:  1. Name of Medication: ranexa 2. How are you currently taking this medication (dosage and times per day)?  3. Are you having a reaction (difficulty breathing--STAT)? no  4. What is your medication issue? Pt has questions regarding the side of this medication

## 2016-01-04 NOTE — Telephone Encounter (Signed)
New message     Patient is calling to let the nurse to know she is having dizziness, finger tingling and tongue tingling.  She did not have this when she was seen earlier. Please advise

## 2016-01-04 NOTE — Patient Instructions (Addendum)
We will be checking the following labs today - NONE   Medication Instructions:    Continue with your current medicines.     Testing/Procedures To Be Arranged:  N/A  Follow-Up:   See Dr. Excell Seltzer in April with fasting labs.     Other Special Instructions:   Liberalize your salt - drink V8, eat soup, etc. This will help keep your BP up/higher    If you need a refill on your cardiac medications before your next appointment, please call your pharmacy.   Call the [facility] office at [phone] if you have any questions, problems or concerns.

## 2016-01-04 NOTE — Telephone Encounter (Signed)
S/w pt stated back of head, tongue, finger tips, are tingling, pt is dizzy denies sob, chest pain, nausea.  S/w Lawson Fiscal stated to check bp eat salt and rest.  If symptoms do not get better call Monday.

## 2016-01-04 NOTE — Progress Notes (Signed)
CARDIOLOGY OFFICE NOTE  Date:  01/04/2016    Christina Morrison Date of Birth: Dec 01, 1976 Medical Record #[id number]  PCP:  Pearson Grippe, MD  Cardiologist:  Excell Seltzer    Chief Complaint  Patient presents with  . Chest Pain  . Coronary Artery Disease    10 day check - seen for Dr. Excell Seltzer    History of Present Illness: Christina Morrison is a 40 y.o. female who presents today for a 10 day check. Seen for Dr. Excell Seltzer.   She has known CAD. She had NSTEMI in April 2016 associated with cocaine use. Found to have severe diagonal stenosis at cath and underwent PCI (bare metal stent). Repeat cath was done later that month for recurrent angina (most likely due to missing her long acting nitrate therapy for a week) with stable coronary anatomy. She does have moderate LCx stenosis.   Last seen here back in October by Dr. Excell Seltzer. Still smoking. Some episodes of chest pain. Occasionally using NTG. Overall, felt to be stable and was to continue with medical therapy.  I saw her 10 days ago - more chest pain. Still smoking but less. She was short of breath. Increased her Ranexa. Trying to get her disability with help of an attorney.    Comes back today. Here alone today. She notes that she is doing better since the increase in Ranexa. Little dizzy. BP soft. She has not used any NTG. She really does not use much salt. Continues to smoke - but still less as discussed at last visit.   Past Medical History  Diagnosis Date  . Depression   . Anxiety   . Bipolar 1 disorder (HCC)   . Scoliosis   . Migraine   . Osteoarthritis   . Coronary artery disease     a. 03/2015 NSTEMI/PCI: LM 20ost, LAD 30-40p/m, D1 40-50ost, 90p (2.75x12 Vision BMS), LCX 50p/m, RCA 40-39m, EF 65-70% (UDS +for cocaine).  [name] TIA (transient ischemic attack)   . Polysubstance abuse     a. tobacco/cocaine    Past Surgical History  Procedure Laterality Date  . Coronary stent placement  03/21/2015    first diagonal  . Left heart  catheterization with coronary angiogram N/A 03/21/2015    Procedure: LEFT HEART CATHETERIZATION WITH CORONARY ANGIOGRAM;  Surgeon: [name], MD;  Location: [facility] CATH LAB;  Service: Cardiovascular;  Laterality: N/A;  . Cardiac catheterization  03/21/2015    Procedure: CORONARY STENT INTERVENTION;  Surgeon: [name], MD;  Location: [facility] CATH LAB;  Service: Cardiovascular;;  diag bms 2.75 x 12 vision  . Left heart catheterization with coronary angiogram N/A 04/11/2015    Procedure: LEFT HEART CATHETERIZATION WITH CORONARY ANGIOGRAM;  Surgeon: Kathleene Hazel, MD;  Location: [facility] CATH LAB;  Service: Cardiovascular;  Laterality: N/A;  . Esophagogastroduodenoscopy (egd) with propofol N/A 06/11/2015    Procedure: ESOPHAGOGASTRODUODENOSCOPY (EGD) WITH PROPOFOL;  Surgeon: Bernette Redbird, MD;  Location: [facility] ENDOSCOPY;  Service: Endoscopy;  Laterality: N/A;     Medications: Current Outpatient Prescriptions  Medication Sig Dispense Refill  . ALPRAZolam (XANAX) 0.5 MG tablet Take 0.5 mg by mouth 3 (three) times daily.    [name] amLODipine (NORVASC) 5 MG tablet Take 1 tablet (5 mg total) by mouth daily. 90 tablet 2  . aspirin EC 81 MG EC tablet Take 1 tablet (81 mg total) by mouth daily.    [name] atorvastatin (LIPITOR) 20 MG tablet Take 1 tablet (20 mg total) by mouth daily. 90 tablet 3  . buPROPion (  WELLBUTRIN SR) 150 MG 12 hr tablet Take 1-2 tablets (150-300 mg total) by mouth 2 (two) times daily. Takes  in am and  in pm 60 tablet 3  . clopidogrel (PLAVIX) 75 MG tablet Take 1 tablet (75 mg total) by mouth daily with breakfast. 90 tablet 2  . escitalopram (LEXAPRO) 10 MG tablet Take 10 mg by mouth daily.    . fluticasone (FLONASE) 50 MCG/ACT nasal spray Place 2 sprays into both nostrils daily as needed for allergies or rhinitis.    [name] guaiFENesin (MUCINEX) 600 MG 12 hr tablet Take 600 mg by mouth 2 (two) times daily as needed for cough or to loosen phlegm.    . isosorbide mononitrate (IMDUR) 30 MG 24  hr tablet Take 3 tablets (90 mg total) by mouth daily. 270 tablet 2  . nitroGLYCERIN (NITROSTAT) 0.4 MG SL tablet Place 1 tablet (0.4 mg total) under the tongue every 5 (five) minutes as needed for chest pain. 25 tablet 3  . Oxycodone HCl 10 MG TABS Take 10 mg by mouth 3 (three) times daily as needed.    . pantoprazole (PROTONIX) 40 MG tablet Take 1 tablet (40 mg total) by mouth 2 (two) times daily before a meal. 180 tablet 2  . ranolazine (RANEXA) 1000 MG SR tablet Take 1 tablet (1,000 mg total) by mouth 2 (two) times daily. 60 tablet 6   No current facility-administered medications for this visit.    Allergies: No Known Allergies  Social History: The patient  reports that she has been smoking Cigarettes.  She has been smoking about 1.00 pack per day. She has never used smokeless tobacco. She reports that she uses illicit drugs (Cocaine). She reports that she does not drink alcohol.   Family History: The patient's family history includes Cancer in her maternal aunt; Depression in her mother; Diabetes in her father; Heart disease in her father and mother; Hyperlipidemia in her father and mother; Hypertension in her father and mother; Mental retardation in her father and mother; Stroke in her maternal grandmother.   Review of Systems: Please see the history of present illness.   Otherwise, the review of systems is positive for none.   All other systems are reviewed and negative.   Physical Exam: VS:  BP 100/70 mmHg  Pulse 76  Ht  (1.549 m)  Wt 149 lb 12.8 oz (67.949 kg)  BMI 28.32 kg/m2 .  BMI Body mass index is 28.32 kg/(m^2).  Wt Readings from Last 3 Encounters:  01/04/16 149 lb 12.8 oz (67.949 kg)  12/25/15 150 lb 6.4 oz (68.221 kg)  09/24/15 147 lb 1.9 oz (66.733 kg)    General: Pleasant. She is alert and in no acute distress.  HEENT: Normal. Neck: Supple, no JVD, carotid bruits, or masses noted.  Cardiac: Regular rate and rhythm. No murmurs, rubs, or gallops. No edema.    Respiratory:  Lungs are clear to auscultation bilaterally with normal work of breathing.  GI: Soft and nontender.  MS: No deformity or atrophy. Gait and ROM intact. Skin: Warm and dry. Color is normal.  Neuro:  Strength and sensation are intact and no gross focal deficits noted.  Psych: Alert, appropriate and with normal affect.   LABORATORY DATA:  EKG:  EKG is ordered today. This demonstrates NSR.  Lab Results  Component Value Date   WBC 4.1 06/10/2015   HGB 13.2 06/10/2015   HCT 38.6 06/10/2015   PLT 151 06/10/2015   GLUCOSE 98 12/20/2015   CHOL  160 12/20/2015   TRIG 95 12/20/2015   HDL 39* 12/20/2015   LDLCALC 102 12/20/2015   ALT 16 06/10/2015   AST 15 06/10/2015   NA 138 12/20/2015   K 4.1 12/20/2015   CL 104 12/20/2015   CREATININE 0.79 12/20/2015   BUN 11 12/20/2015   CO2 25 12/20/2015   TSH 1.129 04/11/2015   INR 1.15 06/10/2015   HGBA1C 5.2 04/11/2015    BNP (last 3 results)  Recent Labs  06/09/15 2010  BNP 32.4    ProBNP (last 3 results) No results for input(s): PROBNP in the last 8760 hours.   Other Studies Reviewed Today:  Echo Study Conclusions 03/2015  - Left ventricle: The cavity size was normal. Wall thickness was normal. Systolic function was normal. The estimated ejection fraction was in the range of 60% to 65%. Wall motion was normal; there were no regional wall motion abnormalities. - Aortic valve: There was trivial regurgitation. - Mitral valve: There was trivial regurgitation. - Atrial septum: No defect or patent foramen ovale was identified. - Tricuspid valve: There was trivial regurgitation.  Angiographic Findings from 03/2015:  Left main: Normal caliber vessel that divides into the LAD and left circumflex.. Ostial 20% stenosis.   Left Anterior Descending Artery: Large caliber vessel that courses to the apex. There is a focal 20% mid stenosis at the takeoff of the first diagonal branch and a 30-40% stenosis in the mid LAD  at the takeoff of the septal perforating branch. The first diagonal branch is a moderate caliber vessel with ostial 30% stenosis and a patent stent in the mid vessel with no restenosis.   Circumflex Artery: Moderate caliber vessel with small caliber obtuse marginal branch. The proximal vessel has diffuse 50% stenosis. The mid AV groove Circumflex has a 50% stenosis beyond the bifurcation of the first obtuse marginal branch. The first obtuse marginal branch has an ostial 50% stenosis. Of note, the vessel was noted to have diffuse vasospasm throughout that responded to IC NTG. The disease in the is vessel is unchanged from last cath and does not appear to be flow limiting.   Right Coronary Artery: Moderate caliber dominant vessel. The mid RCA has 40% stenosis. The distal vessel has a focal 30% stenosis. The PDA and PLA branches are patent. 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   Left Ventricular Angiogram: LVEF=65%.   Impression: 1. Double vessel CAD with patent stent in the diagonal, moderate disease in the Circumflex that is unchanged from the prior cath 3 weeks ago.  2. Coronary vasospasm noted during the catheterization 3. Normal LV systolic function  Recommendations: Continue medical management. Complete smoking cessation recommended with evidence of coronary vasospasm. Continue calcium channel blocker and long acting nitrate.     Assessment/Plan: 1. CAD, native vessel, with angina CCS Class II/III. BP remains too low for additive rx with beta blocker therapy but may need to try in the future along with liberalization of her salt use. With the increase in the Ranexa she has improved clinically. Encouraged her to increase her use of salt - drink V8, soup, etc.  Reminded again that we need total tobacco cessation - she says one pack is now lasting 3 to 4 days whereas it used to be one pack a day. See  back in April. Discussed with Dr. Excell Seltzer here in the office and he agrees with this plan of care.  2.  Tobacco abuse: cessation counseling done again. She understands this is a key component of reducing risk of recurrence.  3. Polysubstance abuse:   4. HLD - switched to Lipitor at last visit -  Would recheck labs on return OV with Dr. Excell Seltzer.  Current medicines are reviewed with the patient today.  The patient does not have concerns regarding medicines other than what has been noted above.  The following changes have been made:  See above.  Labs/ tests ordered today include:    Orders Placed This Encounter  Procedures  . EKG 12-Lead     Disposition:   FU with Dr. Excell Seltzer in April with fasting labs.   Patient is agreeable to this plan and will call if any problems develop in the interim.   Signed: Rosalio Macadamia, RN, ANP-C 01/04/2016 10:33 AM  [facility] [address] [place], [place]  [address] Phone: [phone] Fax: [phone]

## 2016-01-07 NOTE — Telephone Encounter (Signed)
Agree 

## 2016-01-07 NOTE — Telephone Encounter (Signed)
Pt stated stopped taking Ranexa ( 1000 mg ) bid and the dizziness and confusion stopped.  Stated was taking ( 500 mg ) bid without confusion and dizziness. Discussed with Lawson Fiscal stated try to get pt to take Ranexa ( 500 mg ) am and ( 1000 mg ) pm. Is agreeable to plan and if is having problems this week will call back and make appointment to see Dr. Excell Seltzer.  Will send to [place] to [place].

## 2016-01-07 NOTE — Telephone Encounter (Signed)
Pt stated stop taking Ranexa ( 1000 mg ) bid

## 2016-01-12 ENCOUNTER — Emergency Department (HOSPITAL_COMMUNITY): Payer: Medicaid Other

## 2016-01-12 ENCOUNTER — Inpatient Hospital Stay (HOSPITAL_COMMUNITY)
Admission: EM | Admit: 2016-01-12 | Discharge: 2016-01-15 | DRG: 247 | Disposition: A | Discharge status: Home / Self Care | Payer: Medicaid Other | Attending: Cardiology | Admitting: Cardiology

## 2016-01-12 ENCOUNTER — Encounter (HOSPITAL_COMMUNITY): Payer: Self-pay | Admitting: Vascular Surgery

## 2016-01-12 DIAGNOSIS — F419 Anxiety disorder, unspecified: Secondary | ICD-10-CM | POA: Diagnosis present

## 2016-01-12 DIAGNOSIS — E785 Hyperlipidemia, unspecified: Secondary | ICD-10-CM | POA: Diagnosis present

## 2016-01-12 DIAGNOSIS — I214 Non-ST elevation (NSTEMI) myocardial infarction: Secondary | ICD-10-CM | POA: Diagnosis present

## 2016-01-12 DIAGNOSIS — Z8673 Personal history of transient ischemic attack (TIA), and cerebral infarction without residual deficits: Secondary | ICD-10-CM

## 2016-01-12 DIAGNOSIS — I252 Old myocardial infarction: Secondary | ICD-10-CM | POA: Diagnosis not present

## 2016-01-12 DIAGNOSIS — F329 Major depressive disorder, single episode, unspecified: Secondary | ICD-10-CM | POA: Diagnosis present

## 2016-01-12 DIAGNOSIS — F1410 Cocaine abuse, uncomplicated: Secondary | ICD-10-CM

## 2016-01-12 DIAGNOSIS — F17200 Nicotine dependence, unspecified, uncomplicated: Secondary | ICD-10-CM | POA: Diagnosis present

## 2016-01-12 DIAGNOSIS — G8929 Other chronic pain: Secondary | ICD-10-CM | POA: Diagnosis present

## 2016-01-12 DIAGNOSIS — M419 Scoliosis, unspecified: Secondary | ICD-10-CM | POA: Diagnosis present

## 2016-01-12 DIAGNOSIS — I25110 Atherosclerotic heart disease of native coronary artery with unstable angina pectoris: Secondary | ICD-10-CM | POA: Diagnosis present

## 2016-01-12 DIAGNOSIS — I2583 Coronary atherosclerosis due to lipid rich plaque: Secondary | ICD-10-CM

## 2016-01-12 DIAGNOSIS — M1990 Unspecified osteoarthritis, unspecified site: Secondary | ICD-10-CM | POA: Diagnosis present

## 2016-01-12 DIAGNOSIS — Z955 Presence of coronary angioplasty implant and graft: Secondary | ICD-10-CM | POA: Diagnosis not present

## 2016-01-12 DIAGNOSIS — G43909 Migraine, unspecified, not intractable, without status migrainosus: Secondary | ICD-10-CM | POA: Diagnosis present

## 2016-01-12 DIAGNOSIS — Z9114 Patient's other noncompliance with medication regimen: Secondary | ICD-10-CM

## 2016-01-12 DIAGNOSIS — F319 Bipolar disorder, unspecified: Secondary | ICD-10-CM | POA: Diagnosis present

## 2016-01-12 DIAGNOSIS — I200 Unstable angina: Secondary | ICD-10-CM | POA: Diagnosis present

## 2016-01-12 DIAGNOSIS — Z7982 Long term (current) use of aspirin: Secondary | ICD-10-CM

## 2016-01-12 DIAGNOSIS — Z9861 Coronary angioplasty status: Secondary | ICD-10-CM

## 2016-01-12 DIAGNOSIS — I2510 Atherosclerotic heart disease of native coronary artery without angina pectoris: Secondary | ICD-10-CM

## 2016-01-12 DIAGNOSIS — F17210 Nicotine dependence, cigarettes, uncomplicated: Secondary | ICD-10-CM | POA: Diagnosis present

## 2016-01-12 DIAGNOSIS — Z7902 Long term (current) use of antithrombotics/antiplatelets: Secondary | ICD-10-CM | POA: Diagnosis not present

## 2016-01-12 DIAGNOSIS — F1110 Opioid abuse, uncomplicated: Secondary | ICD-10-CM

## 2016-01-12 DIAGNOSIS — F32A Depression, unspecified: Secondary | ICD-10-CM | POA: Diagnosis present

## 2016-01-12 LAB — BASIC METABOLIC PANEL
ANION GAP: 10 (ref 5–15)
BUN: 9 mg/dL (ref 6–20)
CO2: 24 mmol/L (ref 22–32)
Calcium: 9.3 mg/dL (ref 8.9–10.3)
Chloride: 105 mmol/L (ref 101–111)
Creatinine, Ser: 0.79 mg/dL (ref 0.44–1.00)
GFR calc non Af Amer: >60 mL/min (ref >60)
GLUCOSE: 106 mg/dL — AB (ref 65–99)
POTASSIUM: 4.2 mmol/L (ref 3.5–5.1)
Sodium: 139 mmol/L (ref 135–145)

## 2016-01-12 LAB — HEPARIN LEVEL (UNFRACTIONATED): Heparin Unfractionated: 0.14 IU/mL — ABNORMAL LOW (ref 0.30–0.70)

## 2016-01-12 LAB — CBC
HEMATOCRIT: 39.1 % (ref 36.0–46.0)
Hemoglobin: 13.4 g/dL (ref 12.0–15.0)
MCH: 30.5 pg (ref 26.0–34.0)
MCHC: 34.3 g/dL (ref 30.0–36.0)
MCV: 88.9 fL (ref 78.0–100.0)
Platelets: 213 10*3/uL (ref 150–400)
RBC: 4.4 MIL/uL (ref 3.87–5.11)
RDW: 12.5 % (ref 11.5–15.5)
WBC: 5.1 10*3/uL (ref 4.0–10.5)

## 2016-01-12 LAB — TROPONIN I
Troponin I: 0.1 ng/mL — ABNORMAL HIGH (ref <0.031)
Troponin I: 0.15 ng/mL — ABNORMAL HIGH (ref <0.031)

## 2016-01-12 LAB — I-STAT TROPONIN, ED: Troponin i, poc: 0 ng/mL (ref 0.00–0.08)

## 2016-01-12 MED ORDER — HEPARIN BOLUS VIA INFUSION
2000.0000 [IU] | Freq: Once | INTRAVENOUS | Status: AC
  Administered 2016-01-12: 2000 [IU] via INTRAVENOUS
  Filled 2016-01-12: qty 2000

## 2016-01-12 MED ORDER — NITROGLYCERIN IN D5W 200-5 MCG/ML-% IV SOLN
0.0000 ug/min | Freq: Once | INTRAVENOUS | Status: DC
  Filled 2016-01-12: qty 250

## 2016-01-12 MED ORDER — ONDANSETRON HCL 4 MG/2ML IJ SOLN
4.0000 mg | Freq: Once | INTRAMUSCULAR | Status: AC
  Administered 2016-01-12: 4 mg via INTRAVENOUS
  Filled 2016-01-12: qty 2

## 2016-01-12 MED ORDER — HYDROMORPHONE HCL 1 MG/ML IJ SOLN
0.5000 mg | Freq: Once | INTRAMUSCULAR | Status: AC
  Administered 2016-01-12: 0.5 mg via INTRAVENOUS
  Filled 2016-01-12: qty 1

## 2016-01-12 MED ORDER — OXYCODONE HCL 5 MG PO TABS
10.0000 mg | ORAL_TABLET | Freq: Three times a day (TID) | ORAL | Status: DC | PRN
  Administered 2016-01-12 – 2016-01-13 (×3): 10 mg via ORAL
  Filled 2016-01-12 (×3): qty 2

## 2016-01-12 MED ORDER — MORPHINE SULFATE (PF) 4 MG/ML IV SOLN
4.0000 mg | Freq: Once | INTRAVENOUS | Status: AC
  Administered 2016-01-12: 4 mg via INTRAVENOUS
  Filled 2016-01-12: qty 1

## 2016-01-12 MED ORDER — NITROGLYCERIN 0.4 MG SL SUBL: 0.4000 mg | SUBLINGUAL_TABLET | SUBLINGUAL | Status: DC | PRN

## 2016-01-12 MED ORDER — HEPARIN (PORCINE) IN NACL 100-0.45 UNIT/ML-% IJ SOLN
1550.0000 [IU]/h | INTRAMUSCULAR | Status: DC
  Administered 2016-01-14: 1550 [IU]/h via INTRAVENOUS
  Filled 2016-01-12: qty 250

## 2016-01-12 MED ORDER — ISOSORBIDE MONONITRATE ER 60 MG PO TB24
90.0000 mg | ORAL_TABLET | Freq: Every day | ORAL | Status: DC
  Administered 2016-01-12 – 2016-01-14 (×3): 90 mg via ORAL
  Filled 2016-01-12 (×3): qty 1

## 2016-01-12 MED ORDER — HEPARIN BOLUS VIA INFUSION
4000.0000 [IU] | Freq: Once | INTRAVENOUS | Status: AC
  Administered 2016-01-12: 4000 [IU] via INTRAVENOUS
  Filled 2016-01-12: qty 4000

## 2016-01-12 MED ORDER — ASPIRIN 81 MG PO CHEW
243.0000 mg | CHEWABLE_TABLET | Freq: Once | ORAL | Status: AC
  Administered 2016-01-12: 243 mg via ORAL
  Filled 2016-01-12: qty 3

## 2016-01-12 MED ORDER — HEPARIN (PORCINE) IN NACL 100-0.45 UNIT/ML-% IJ SOLN
800.0000 [IU]/h | INTRAMUSCULAR | Status: DC
  Administered 2016-01-12: 800 [IU]/h via INTRAVENOUS
  Filled 2016-01-12: qty 250

## 2016-01-12 NOTE — ED Notes (Signed)
Pt reports to the ED for eval of midsternal CP and pressure intermittent x 3-4 days but increasingly worse/more frequent since last night. She reports the pain radiates into her neck and she has bilateral arm heaviness. Pt reports associated symptoms of nausea, dizziness, and SOB. 12 lead unremarkable but she states she has hx of NSTEMI in the past and this feels similar. She is also out of her isosorbide dinitrate for an uncertain amount time. Has had a lot of stress lately. Pt A&Ox4, resp e/u, and skin warm and dry,.

## 2016-01-12 NOTE — ED Notes (Signed)
Patient refuses nitro stating it always gives her a headache. She states that the only thing that works for her is Dilaudid. States if we do not give her Dilaudid she will take everything off and leave AMA. ED PA made aware and states she can have the Dilaudid.

## 2016-01-12 NOTE — H&P (Addendum)
Patient ID: Christina Morrison MRN: [id number], DOB/AGE: 1976-04-26   Admit date: 01/12/2016   Primary Physician: Pearson Grippe, MD Primary Cardiologist: Dr Excell Seltzer Consulting physician: Dr Tarri Abernethy, Sam  Pt. Profile:  Prior NSTEMI, now chest pain  Problem List  Past Medical History  Diagnosis Date  . Depression   . Anxiety   . Bipolar 1 disorder (HCC)   . Scoliosis   . Migraine   . Osteoarthritis   . Coronary artery disease     a. 03/2015 NSTEMI/PCI: LM 20ost, LAD 30-40p/m, D1 40-50ost, 90p (2.75x12 Vision BMS), LCX 50p/m, RCA 40-81m, EF 65-70% (UDS +for cocaine).  [name] TIA (transient ischemic attack)   . Polysubstance abuse     a. tobacco/cocaine    Past Surgical History  Procedure Laterality Date  . Coronary stent placement  03/21/2015    first diagonal  . Left heart catheterization with coronary angiogram N/A 03/21/2015    Procedure: LEFT HEART CATHETERIZATION WITH CORONARY ANGIOGRAM;  Surgeon: [name], MD;  Location: [facility] CATH LAB;  Service: Cardiovascular;  Laterality: N/A;  . Cardiac catheterization  03/21/2015    Procedure: CORONARY STENT INTERVENTION;  Surgeon: [name], MD;  Location: [facility] CATH LAB;  Service: Cardiovascular;;  diag bms 2.75 x 12 vision  . Left heart catheterization with coronary angiogram N/A 04/11/2015    Procedure: LEFT HEART CATHETERIZATION WITH CORONARY ANGIOGRAM;  Surgeon: Kathleene Hazel, MD;  Location: [facility] CATH LAB;  Service: Cardiovascular;  Laterality: N/A;  . Esophagogastroduodenoscopy (egd) with propofol N/A 06/11/2015    Procedure: ESOPHAGOGASTRODUODENOSCOPY (EGD) WITH PROPOFOL;  Surgeon: Bernette Redbird, MD;  Location: [facility] ENDOSCOPY;  Service: Endoscopy;  Laterality: N/A;     Allergies  Allergies  Allergen Reactions  . Ranexa [Ranolazine] Nausea Only    Dizziness    HPI  Christina Morrison is a 40 y.o. female who is well known to our service, followed by Dr Excell Seltzer. She has known CAD. She had NSTEMI in April 2016 associated with  cocaine use. Found to have severe diagonal stenosis at cath and underwent PCI (bare metal stent). Repeat cath was done later that month for recurrent angina (most likely due to missing her long acting nitrate therapy for a week) with stable coronary anatomy. She does have moderate LCx stenosis. Last seen here back in October by Dr. Excell Seltzer. Still smoking. Some episodes of chest pain. Occasionally using NTG. Overall, felt to be stable and was to continue with medical therapy. She presented to the cardiology clinic on 12/25/2015 with worsening chest pain and ongoing smoking. She stated that she was short of breath and her Ranexa was increased. She also stated that she is applying for disability. She was seen by Norma Fredrickson on 01/04/2016 and stated she is doing better since starting on the Ranexa. She hasn't been using nitroglycerin.  Today she comes with worsening chest pain that started 2 days ago, and woke her up at night, it is intermittent burst with walking and with changing position like laying in bed. She denies any nausea vomiting, palpitations, SOB. She denies recent fever, chills or cough. She admits to ongoing smoking and not using Imdur for the last 3 weeks. No syncope. She denies cocaine use.  She is persistently asks for dilauded, stating that nitroglycerin and morphine doesn't help her and we shouldn't try to give her that. She is asking oxycontin. Currently 9/10 pain.    Home Medications  Prior to Admission medications   Medication Sig Start Date End Date Taking? Authorizing Provider  ALPRAZolam (XANAX) 0.5 MG tablet Take 0.5 mg by mouth 3 (three) times daily.   Yes Historical Provider, MD  amLODipine (NORVASC) 5 MG tablet Take 1 tablet (5 mg total) by mouth daily. 09/24/15  Yes [name], MD  aspirin EC 81 MG EC tablet Take 1 tablet (81 mg total) by mouth daily. 03/23/15  Yes Ok Anis, NP  atorvastatin (LIPITOR) 20 MG tablet Take 1 tablet (20 mg total) by mouth daily.  12/25/15  Yes Rosalio Macadamia, NP  buPROPion (WELLBUTRIN SR) 150 MG 12 hr tablet Take 1-2 tablets (150-300 mg total) by mouth 2 (two) times daily. Takes  in am and  in pm 06/11/15  Yes Joseph Art, DO  clopidogrel (PLAVIX) 75 MG tablet Take 1 tablet (75 mg total) by mouth daily with breakfast. 09/24/15  Yes [name], MD  escitalopram (LEXAPRO) 10 MG tablet Take 10 mg by mouth daily.   Yes Historical Provider, MD  fluticasone (FLONASE) 50 MCG/ACT nasal spray Place 2 sprays into both nostrils daily as needed for allergies or rhinitis.   Yes Historical Provider, MD  guaiFENesin (MUCINEX) 600 MG 12 hr tablet Take 600 mg by mouth 2 (two) times daily as needed for cough or to loosen phlegm.   Yes Historical Provider, MD  isosorbide mononitrate (IMDUR) 30 MG 24 hr tablet Take 3 tablets (90 mg total) by mouth daily. 09/24/15  Yes [name], MD  nitroGLYCERIN (NITROSTAT) 0.4 MG SL tablet Place 1 tablet (0.4 mg total) under the tongue every 5 (five) minutes as needed for chest pain. 03/23/15  Yes Ok Anis, NP  Oxycodone HCl 10 MG TABS Take 10 mg by mouth 3 (three) times daily as needed (for pain).    Yes Historical Provider, MD  pantoprazole (PROTONIX) 40 MG tablet Take 1 tablet (40 mg total) by mouth 2 (two) times daily before a meal. 09/24/15  Yes [name], MD  ranolazine (RANEXA) 1000 MG SR tablet Take 1 tablet (1,000 mg total) by mouth 2 (two) times daily. Patient taking differently: Take 500-1,000 mg by mouth 2 (two) times daily. 500 mg every morning and 1000 mg every evening 12/25/15  Yes Rosalio Macadamia, NP    Family History  Family History  Problem Relation Age of Onset  . Mental retardation Mother   . Hypertension Mother   . Hyperlipidemia Mother   . Heart disease Mother   . Depression Mother   . Hypertension Father   . Hyperlipidemia Father   . Heart disease Father   . Mental retardation Father   . Diabetes Father   . Cancer Maternal Aunt   . Stroke  Maternal Grandmother     Social History  Social History   Social History  . Marital Status: Divorced    Spouse Name: N/A  . Number of Children: N/A  . Years of Education: N/A   Occupational History  . Not on file.   Social History Main Topics  . Smoking status: Current Every Day Smoker -- 1.00 packs/day    Types: Cigarettes  . Smokeless tobacco: Never Used  . Alcohol Use: No     Comment: once a month  . Drug Use: Yes    Special: Cocaine  . Sexual Activity: Not on file   Other Topics Concern  . Not on file   Social History Narrative     Review of Systems General:  No chills, fever, night sweats or weight changes.  Cardiovascular:  No chest pain, dyspnea on  exertion, edema, orthopnea, palpitations, paroxysmal nocturnal dyspnea. Dermatological: No rash, lesions/masses Respiratory: No cough, dyspnea Urologic: No hematuria, dysuria Abdominal:   No nausea, vomiting, diarrhea, bright red blood per rectum, melena, or hematemesis Neurologic:  No visual changes, wkns, changes in mental status. All other systems reviewed and are otherwise negative except as noted above.  Physical Exam  Blood pressure 96/64, pulse 70, temperature 97.9 F (36.6 C), temperature source Oral, resp. rate 15, height  (1.549 m), weight 150 lb (68.04 kg), last menstrual period 12/22/2015, SpO2 100 %.  General: Pleasant, NAD Psych: Normal affect. Neuro: Alert and oriented X 3. Moves all extremities spontaneously. HEENT: Normal  Neck: Supple without bruits or JVD. Lungs:  Resp regular and unlabored, CTA. Heart: RRR no s3, s4, or murmurs. Abdomen: Soft, non-tender, non-distended, BS + x 4.  Extremities: No clubbing, cyanosis or edema. DP/PT/Radials 2+ and equal bilaterally.  Labs  No results for input(s): CKTOTAL, CKMB, TROPONINI in the last 72 hours. Lab Results  Component Value Date   WBC 5.1 01/12/2016   HGB 13.4 01/12/2016   HCT 39.1 01/12/2016   MCV 88.9 01/12/2016   PLT 213  01/12/2016    Recent Labs Lab 01/12/16 1027  NA 139  K 4.2  CL 105  CO2 24  BUN 9  CREATININE 0.79  CALCIUM 9.3  GLUCOSE 106*   Lab Results  Component Value Date   CHOL 160 12/20/2015   HDL 39* 12/20/2015   LDLCALC 102 12/20/2015   TRIG 95 12/20/2015   Lab Results  Component Value Date   DDIMER <0.27 03/20/2015   Invalid input(s): POCBNP   Radiology/Studies  Dg Chest 2 View  01/12/2016  CLINICAL DATA:  Chest pain for 2 days EXAM: CHEST  2 VIEW COMPARISON:  06/09/2015 FINDINGS: The heart size and mediastinal contours are within normal limits. Both lungs are clear. The visualized skeletal structures are unremarkable. IMPRESSION: No active cardiopulmonary disease. Electronically Signed   By: Alcide Clever M.D.   On: 01/12/2016 11:19   Echocardiogram - 04/12/2015 Left ventricle: The cavity size was normal. Wall thickness was normal. Systolic function was normal. The estimated ejection fraction was in the range of 60% to 65%. Wall motion was normal; there were no regional wall motion abnormalities. - Aortic valve: There was trivial regurgitation. - Mitral valve: There was trivial regurgitation. - Atrial septum: No defect or patent foramen ovale was identified. - Tricuspid valve: There was trivial regurgitation.  ECG: SR, normal ECG, unchanged from prior  Left cardiac cath: 04/10/2016  Left main: Normal caliber vessel that divides into the LAD and left circumflex.. Ostial 20% stenosis.   Left Anterior Descending Artery: Large caliber vessel that courses to the apex. There is a focal 20% mid stenosis at the takeoff of the first diagonal branch and a 30-40% stenosis in the mid LAD at the takeoff of the septal perforating branch. The first diagonal branch is a moderate caliber vessel with ostial 30% stenosis and a patent stent in the mid vessel with no restenosis.   Circumflex Artery: Moderate caliber vessel with small caliber obtuse marginal branch. The proximal vessel  has diffuse 50% stenosis. The mid AV groove Circumflex has a 50% stenosis beyond the bifurcation of the first obtuse marginal branch. The first obtuse marginal branch has an ostial 50% stenosis. Of note, the vessel was noted to have diffuse vasospasm throughout that responded to IC NTG. The disease in the is vessel is unchanged from last cath and does not appear to  be flow limiting.   Right Coronary Artery: Moderate caliber dominant vessel. The mid RCA has 40% stenosis. The distal vessel has a focal 30% stenosis. The PDA and PLA branches are patent. 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   Left Ventricular Angiogram: LVEF=65%.   Impression: 1. Double vessel CAD with patent stent in the diagonal, moderate disease in the Circumflex that is unchanged from the prior cath 3 weeks ago.  2. Coronary vasospasm noted during the catheterization 3. Normal LV systolic function  Recommendations: Continue medical management. Complete smoking cessation recommended with evidence of coronary vasospasm. Continue calcium channel blocker and long acting nitrate.     ASSESSMENT AND PLAN  1. Chest pain - suspicious of unstable angina, she has known CAD, prior PCI/stent to diagonal and moderate residual disease in LCX artery. Troponin is negative x 1 and ECG is normal and unchanged from prior.  The patient was explained that we will start iv Heparin drip for unstable angina and use nitroglycerin for chest pain. She is refusing NTG and asking for more Dilaudid. Of note she has just received Dilaudid iv 10 minutes prior to this discussion. She is stating that if we don't give her at least addition of home Oxycodone she is leaving AMA.  I again stressed that we are going to address her heart condition with iv Heparin and NTG. She is not interested. Parents upset, asking for more pain medications. They are explained that their  daughter has got a high dose of narcotics already. Mother screaming that we think her daughter is a drug addict and we are not treating her well. She states that her doctors are prescribing her narcotics because they know what is good for her and we don't know. She follows me to the doctor's room, calling me crazy, not knowing what I am doing, and again screaming that she is going to complain if we don't give her more Dilaudid.    Nurses express concern about being asked for narcotics all the time.  We will admit, cycle troponin and ECGs, if she rules in - cath on Monday, if she rules out - stress test tomorrow.  2. Tobacco abuse: cessation advised.   3. Polysubstance abuse: Denies cocaine but is chronically on narcotics.   4. HLD - switched to Lipitor at last visit.  Signed, Lars Masson, MD, [facility] 01/12/2016, 1:32 PM  At this point if parents don't stop being violent, constantly asking about narcotics we will have to call security. I felt threatened.  Lars Masson 01/12/2016

## 2016-01-12 NOTE — Consult Note (Signed)
Entered in error

## 2016-01-12 NOTE — ED Provider Notes (Signed)
CSN: [id number]     Arrival date & time 01/12/16  1001 History   First MD Initiated Contact with Patient 01/12/16 1032     Chief Complaint  Patient presents with  . Chest Pain   HPI  Christina Morrison is a 40 y.o. F PMH significant for CAD, NSTEMI (03/2015; s/p stent placement), bipolar disorder, anxiety, polysubstance abuse presenting with a 3-4 day history of worsening chest pain. She states the pain started 1 month ago, is midsternal in location, radiates down her arms bilaterally and up into her jaw. She endorses nausea, dizziness, and SOB, and states she experienced the exact same symptoms with her previous NSTEMI. She takes nitro daily, and has been taking multiple doses at home without relief. She states the only thing that improves pain this severe is dilaudid. She attributes her symptoms to not taking isosorbide dinitrate and being stressed out recently. She admits to smoking daily, 1 pack every 3-4 days. She denies fevers, chills, emesis, abdominal pain, changes in bowel/bladder habits, cocaine use.   Last echo 03/2015 revealed 60-65%.   Past Medical History  Diagnosis Date  . Depression   . Anxiety   . Bipolar 1 disorder (HCC)   . Scoliosis   . Migraine   . Osteoarthritis   . Coronary artery disease     a. 03/2015 NSTEMI/PCI: LM 20ost, LAD 30-40p/m, D1 40-50ost, 90p (2.75x12 Vision BMS), LCX 50p/m, RCA 40-70m, EF 65-70% (UDS +for cocaine).  [name] TIA (transient ischemic attack)   . Polysubstance abuse     a. tobacco/cocaine   Past Surgical History  Procedure Laterality Date  . Coronary stent placement  03/21/2015    first diagonal  . Left heart catheterization with coronary angiogram N/A 03/21/2015    Procedure: LEFT HEART CATHETERIZATION WITH CORONARY ANGIOGRAM;  Surgeon: [name], MD;  Location: [facility] CATH LAB;  Service: Cardiovascular;  Laterality: N/A;  . Cardiac catheterization  03/21/2015    Procedure: CORONARY STENT INTERVENTION;  Surgeon: [name], MD;  Location: [facility] CATH  LAB;  Service: Cardiovascular;;  diag bms 2.75 x 12 vision  . Left heart catheterization with coronary angiogram N/A 04/11/2015    Procedure: LEFT HEART CATHETERIZATION WITH CORONARY ANGIOGRAM;  Surgeon: Kathleene Hazel, MD;  Location: [facility] CATH LAB;  Service: Cardiovascular;  Laterality: N/A;  . Esophagogastroduodenoscopy (egd) with propofol N/A 06/11/2015    Procedure: ESOPHAGOGASTRODUODENOSCOPY (EGD) WITH PROPOFOL;  Surgeon: Bernette Redbird, MD;  Location: [facility] ENDOSCOPY;  Service: Endoscopy;  Laterality: N/A;   Family History  Problem Relation Age of Onset  . Mental retardation Mother   . Hypertension Mother   . Hyperlipidemia Mother   . Heart disease Mother   . Depression Mother   . Hypertension Father   . Hyperlipidemia Father   . Heart disease Father   . Mental retardation Father   . Diabetes Father   . Cancer Maternal Aunt   . Stroke Maternal Grandmother    Social History  Substance Use Topics  . Smoking status: Current Every Day Smoker -- 1.00 packs/day    Types: Cigarettes  . Smokeless tobacco: Never Used  . Alcohol Use: No     Comment: once a month   OB History    Gravida Para Term Preterm AB TAB SAB Ectopic Multiple Living   0 0 0 0 0 0 2     Review of Systems  Ten systems are reviewed and are negative for acute change except as noted in the HPI  Allergies  Review of patient's allergies indicates no known allergies.  Home Medications   Prior to Admission medications   Medication Sig Start Date End Date Taking? Authorizing Provider  ALPRAZolam Prudy Feeler) 0.5 MG tablet Take 0.5 mg by mouth 3 (three) times daily.    Historical Provider, MD  amLODipine (NORVASC) 5 MG tablet Take 1 tablet (5 mg total) by mouth daily. 09/24/15   [name], MD  aspirin EC 81 MG EC tablet Take 1 tablet (81 mg total) by mouth daily. 03/23/15   Ok Anis, NP  atorvastatin (LIPITOR) 20 MG tablet Take 1 tablet (20 mg total) by mouth daily. 12/25/15   Rosalio Macadamia, NP   buPROPion (WELLBUTRIN SR) 150 MG 12 hr tablet Take 1-2 tablets (150-300 mg total) by mouth 2 (two) times daily. Takes  in am and  in pm 06/11/15   Joseph Art, DO  clopidogrel (PLAVIX) 75 MG tablet Take 1 tablet (75 mg total) by mouth daily with breakfast. 09/24/15   [name], MD  escitalopram (LEXAPRO) 10 MG tablet Take 10 mg by mouth daily.    Historical Provider, MD  fluticasone (FLONASE) 50 MCG/ACT nasal spray Place 2 sprays into both nostrils daily as needed for allergies or rhinitis.    Historical Provider, MD  guaiFENesin (MUCINEX) 600 MG 12 hr tablet Take 600 mg by mouth 2 (two) times daily as needed for cough or to loosen phlegm.    Historical Provider, MD  isosorbide mononitrate (IMDUR) 30 MG 24 hr tablet Take 3 tablets (90 mg total) by mouth daily. 09/24/15   [name], MD  nitroGLYCERIN (NITROSTAT) 0.4 MG SL tablet Place 1 tablet (0.4 mg total) under the tongue every 5 (five) minutes as needed for chest pain. 03/23/15   Ok Anis, NP  Oxycodone HCl 10 MG TABS Take 10 mg by mouth 3 (three) times daily as needed.    Historical Provider, MD  pantoprazole (PROTONIX) 40 MG tablet Take 1 tablet (40 mg total) by mouth 2 (two) times daily before a meal. 09/24/15   [name], MD  ranolazine (RANEXA) 1000 MG SR tablet Take 1 tablet (1,000 mg total) by mouth 2 (two) times daily. 12/25/15   Rosalio Macadamia, NP   BP 120/84 mmHg  Pulse 79  Temp(Src) 97.9 F (36.6 C) (Oral)  Resp 16  Ht  (1.549 m)  Wt 68.04 kg  BMI 28.36 kg/m2  SpO2 97% Physical Exam  Constitutional: She appears well-developed and well-nourished. No distress.  HENT:  Head: Normocephalic and atraumatic.  Mouth/Throat: Oropharynx is clear and moist. No oropharyngeal exudate.  Eyes: Conjunctivae are normal. Pupils are equal, round, and reactive to light. Right eye exhibits no discharge. Left eye exhibits no discharge. No scleral icterus.  Neck: No tracheal deviation present.   Cardiovascular: Normal rate, regular rhythm, normal heart sounds and intact distal pulses.  Exam reveals no gallop and no friction rub.   No murmur heard. Pulmonary/Chest: Effort normal and breath sounds normal. No respiratory distress. She has no wheezes. She has no rales. She exhibits no tenderness.  Abdominal: Soft. Bowel sounds are normal. She exhibits no distension and no mass. There is no tenderness. There is no rebound and no guarding.  Musculoskeletal: She exhibits no edema.  Lymphadenopathy:    She has no cervical adenopathy.  Neurological: She is alert. Coordination normal.  Skin: Skin is warm and dry. No rash noted. She is not diaphoretic. No erythema.  Psychiatric: She has a normal mood and affect.  Her behavior is normal.  Nursing note and vitals reviewed.   ED Course  Procedures  Labs Review Labs Reviewed  BASIC METABOLIC PANEL  CBC  I-STAT TROPOININ, ED    Imaging Review Dg Chest 2 View  01/12/2016  CLINICAL DATA:  Chest pain for 2 days EXAM: CHEST  2 VIEW COMPARISON:  06/09/2015 FINDINGS: The heart size and mediastinal contours are within normal limits. Both lungs are clear. The visualized skeletal structures are unremarkable. IMPRESSION: No active cardiopulmonary disease. Electronically Signed   By: Alcide Clever M.D.   On: 01/12/2016 11:19   I have personally reviewed and evaluated these images and lab results as part of my medical decision-making.   EKG Interpretation   Date/Time:  Saturday January 12 2016 10:06:00 EST Ventricular Rate:  79 PR Interval:  154 QRS Duration: 76 QT Interval:  382 QTC Calculation: 438 R Axis:   78 Text Interpretation:  Normal sinus rhythm Normal ECG No significant change  since last tracing Confirmed by JACUBOWITZ  MD, SAM ([id number]) on 01/12/2016  11:11:17 AM      MDM   Final diagnoses:  Unstable angina (HCC)   Patient non-toxic appearing and VSS. Based on patient history and physical exam, most likely etiologies include  ACS vs  Prinzmetal's/cocaine-induced angina vs MSK vs anxiety. Less likely etiologies include pericarditis/pericardial effusion, cardiac tamponade, constrictive pericarditis, myocarditis, aortic dissection, thoracic aortic aneurysm, CHF/acute pulmonary edema, pneumonia, pleuritis, pneumothorax, tension pneumothorax, pulmonary embolism, pulmonary HTN, GERD, esophageal spasm, Mallory-Weiss tear, Boerhaave syndrome, peptic ulcer diease, biliary disease, pancreatitis, herpes zoster.   Patient took 81 mg ASA and Plavix today.  Troponin, EKG, CBC, CMP, CXR unremarkable.  HEART score of 3.  Cardiology consulted to see patient.  Per RN: Patient threatening to leave AMA if she is not given IV dilaudid. She states the morphine did nothing for her pain. She is refusing IV nitro.   Patient's mother came into the provider workroom and told me she needs dilaudid. I explained that nitro is the drug of choice, and that dilaudid is not something we routinely give in the ED for ACS symptoms. Her mother appeared frustrated at this response and walked away. She then turned around, came back into the provider workroom, and explained that her daughter takes PO nitro at home, and this has not been helping. I explained I was aware of this, but that an IV nitro drip would be beneficial for her chest pain. She then mentioned that her daughter gets headaches with nitro, and that would not be helpful for her pain. Discussed case with Dr. Ethelda Chick, and he advised one dose of dilaudid, as patient is still in 10/10 pain.  Ordered 0.5 mg dilaudid.   Dr. Delton See evaluated patient. She advises hospital admission for further evaluation.    Melton Krebs, PA-C 01/12/16 2249  Doug Sou, MD 01/13/16 [phone]

## 2016-01-12 NOTE — ED Provider Notes (Addendum)
Complains of anterior chest pain radiating to jaws onset 2 days ago pain is intermittent and worse with exertion or changing positions. She's been treating herself with nitroglycerin with transient relief. Patient reports that she's missed several doses of isosorbide recently. On exam appears mild and carpal lungs clear to auscultation heart regular rate and rhythm no murmurs extremities edema  Doug Sou, MD 01/12/16 1142 Dr. Delton See evaluated patient in the emergency department and arrange for admission. Patient refused nitroglycerin despite explanation that it would treat "heart pain".  Doug Sou, MD 01/12/16 1434

## 2016-01-12 NOTE — ED Notes (Signed)
Report attempted 

## 2016-01-12 NOTE — Progress Notes (Addendum)
ANTICOAGULATION CONSULT NOTE - Initial Consult  Pharmacy Consult for heparin Indication: chest pain/ACS  Allergies  Allergen Reactions  . Ranexa [Ranolazine] Nausea Only    Dizziness    Patient Measurements: Height:  (154.9 cm) Weight: 150 lb (68.04 kg) IBW/kg (Calculated) : 47.8 Heparin Dosing Weight: 62.4 kg  Vital Signs: Temp: 97.9 F (36.6 C) (01/28 1015) Temp Source: Oral (01/28 1015) BP: 97/71 mmHg (01/28 1500) Pulse Rate: 75 (01/28 1500)  Labs:  Recent Labs  01/12/16 1027  HGB 13.4  HCT 39.1  PLT 213  CREATININE 0.79    Estimated Creatinine Clearance: 83.3 mL/min (by C-G formula based on Cr of 0.79).   Medical History: Past Medical History  Diagnosis Date  . Depression   . Anxiety   . Bipolar 1 disorder (HCC)   . Scoliosis   . Migraine   . Osteoarthritis   . Coronary artery disease     a. 03/2015 NSTEMI/PCI: LM 20ost, LAD 30-40p/m, D1 40-50ost, 90p (2.75x12 Vision BMS), LCX 50p/m, RCA 40-75m, EF 65-70% (UDS +for cocaine).  [name] TIA (transient ischemic attack)   . Polysubstance abuse     a. tobacco/cocaine    Medications:   (Not in a hospital admission)  Assessment: 40 yo female starting on heparin for chest pain, pt has known CAD - on asa + plavix PTA. CBC wnl.  Goal of Therapy:  Heparin level 0.3-0.7 units/ml Monitor platelets by anticoagulation protocol: Yes   Plan:  -Heparin 4000 units x1 then 800 units/hr -Daily HL, CBC -Monitor s/sx bleeding -First level this evening   Masters, Darl Householder 01/12/2016,3:41 PM    Addendum: Initial heparin level is below goal at 0.14. No issues with infusion.   Plan: 1) Re-bolus heparin 2000 units x 1 2) Increase heparin to 1000 units/hr 3) Follow up AM labs  Emi Holes 01/12/2016, 9:26 PM

## 2016-01-13 DIAGNOSIS — F17200 Nicotine dependence, unspecified, uncomplicated: Secondary | ICD-10-CM

## 2016-01-13 DIAGNOSIS — I25110 Atherosclerotic heart disease of native coronary artery with unstable angina pectoris: Secondary | ICD-10-CM

## 2016-01-13 DIAGNOSIS — I201 Angina pectoris with documented spasm: Secondary | ICD-10-CM

## 2016-01-13 DIAGNOSIS — I214 Non-ST elevation (NSTEMI) myocardial infarction: Principal | ICD-10-CM

## 2016-01-13 LAB — CBC
HEMATOCRIT: 37.3 % (ref 36.0–46.0)
HEMOGLOBIN: 12.7 g/dL (ref 12.0–15.0)
MCH: 30.4 pg (ref 26.0–34.0)
MCHC: 34 g/dL (ref 30.0–36.0)
MCV: 89.2 fL (ref 78.0–100.0)
Platelets: 202 10*3/uL (ref 150–400)
RBC: 4.18 MIL/uL (ref 3.87–5.11)
RDW: 12.6 % (ref 11.5–15.5)
WBC: 5.3 10*3/uL (ref 4.0–10.5)

## 2016-01-13 LAB — BASIC METABOLIC PANEL
ANION GAP: 9 (ref 5–15)
BUN: 9 mg/dL (ref 6–20)
CHLORIDE: 102 mmol/L (ref 101–111)
CO2: 29 mmol/L (ref 22–32)
CREATININE: 0.92 mg/dL (ref 0.44–1.00)
Calcium: 9 mg/dL (ref 8.9–10.3)
GFR calc non Af Amer: >60 mL/min (ref >60)
Glucose, Bld: 99 mg/dL (ref 65–99)
POTASSIUM: 3.6 mmol/L (ref 3.5–5.1)
SODIUM: 140 mmol/L (ref 135–145)

## 2016-01-13 LAB — HEPARIN LEVEL (UNFRACTIONATED)
HEPARIN UNFRACTIONATED: 0.18 [IU]/mL — AB (ref 0.30–0.70)
Heparin Unfractionated: 0.16 IU/mL — ABNORMAL LOW (ref 0.30–0.70)
Heparin Unfractionated: 0.34 IU/mL (ref 0.30–0.70)

## 2016-01-13 LAB — PROTIME-INR
INR: 1.13 (ref 0.00–1.49)
Prothrombin Time: 14.7 seconds (ref 11.6–15.2)

## 2016-01-13 LAB — TROPONIN I: Troponin I: 0.12 ng/mL — ABNORMAL HIGH (ref <0.031)

## 2016-01-13 MED ORDER — ASPIRIN 81 MG PO CHEW
81.0000 mg | CHEWABLE_TABLET | Freq: Every day | ORAL | Status: DC
  Administered 2016-01-13 – 2016-01-15 (×3): 81 mg via ORAL
  Filled 2016-01-13 (×3): qty 1

## 2016-01-13 MED ORDER — ALPRAZOLAM 0.25 MG PO TABS
0.2500 mg | ORAL_TABLET | Freq: Three times a day (TID) | ORAL | Status: DC | PRN
Start: 2016-01-13 — End: 2016-01-14
  Administered 2016-01-13: 0.25 mg via ORAL
  Filled 2016-01-13: qty 1

## 2016-01-13 MED ORDER — HEPARIN BOLUS VIA INFUSION
2000.0000 [IU] | Freq: Once | INTRAVENOUS | Status: AC
  Administered 2016-01-14: 2000 [IU] via INTRAVENOUS
  Filled 2016-01-13: qty 2000

## 2016-01-13 MED ORDER — SODIUM CHLORIDE 0.9% FLUSH: 3.0000 mL | INTRAVENOUS | Status: DC | PRN

## 2016-01-13 MED ORDER — SODIUM CHLORIDE 0.9 % WEIGHT BASED INFUSION
1.0000 mL/kg/h | INTRAVENOUS | Status: DC
  Administered 2016-01-13: 1 mL/kg/h via INTRAVENOUS

## 2016-01-13 MED ORDER — SODIUM CHLORIDE 0.9% FLUSH
3.0000 mL | Freq: Two times a day (BID) | INTRAVENOUS | Status: DC
  Administered 2016-01-13 – 2016-01-14 (×2): 3 mL via INTRAVENOUS

## 2016-01-13 MED ORDER — OXYCODONE HCL 5 MG PO TABS
10.0000 mg | ORAL_TABLET | Freq: Four times a day (QID) | ORAL | Status: DC | PRN
  Administered 2016-01-13 – 2016-01-15 (×8): 10 mg via ORAL
  Filled 2016-01-13 (×8): qty 2

## 2016-01-13 MED ORDER — SODIUM CHLORIDE 0.9 % IV SOLN: 250.0000 mL | INTRAVENOUS | Status: DC | PRN

## 2016-01-13 NOTE — Progress Notes (Signed)
ANTICOAGULATION CONSULT NOTE Pharmacy Consult for heparin Indication: chest pain/ACS  Allergies  Allergen Reactions  . Ranexa [Ranolazine] Nausea Only    Dizziness    Labs:  Recent Labs  01/12/16 1027 01/12/16 1549 01/12/16 2027 01/13/16 0346  HGB 13.4  --   --  12.7  HCT 39.1  --   --  37.3  PLT 213  --   --  202  HEPARINUNFRC  --   --  0.14* 0.18*  CREATININE 0.79  --   --   --   TROPONINI  --  0.10* 0.15*  --     Estimated Creatinine Clearance: 83.5 mL/min (by C-G formula based on Cr of 0.79).   Medical History: Past Medical History  Diagnosis Date  . Depression   . Anxiety   . Bipolar 1 disorder (HCC)   . Scoliosis   . Migraine   . Osteoarthritis   . Coronary artery disease     a. 03/2015 NSTEMI/PCI: LM 20ost, LAD 30-40p/m, D1 40-50ost, 90p (2.75x12 Vision BMS), LCX 50p/m, RCA 40-50m, EF 65-70% (UDS +for cocaine).  [name] TIA (transient ischemic attack)   . Polysubstance abuse     a. tobacco/cocaine     Assessment: 40 yo female starting on heparin for chest pain, pt has known CAD - on asa + plavix PTA. CBC wnl.  HL low at 0.18  Goal of Therapy:  Heparin level 0.3-0.7 units/ml Monitor platelets by anticoagulation protocol: Yes   Plan:  Another heparin bolus 2000 units x 1 Increase drip to 1300 units / hr 6 hr heparin level  Thank you Okey Regal, PharmD [phone] 01/13/2016,4:20 AM

## 2016-01-13 NOTE — Progress Notes (Addendum)
ANTICOAGULATION CONSULT NOTE - Follow Up Consult  Pharmacy Consult for Heparin Indication: chest pain/ACS  Allergies  Allergen Reactions  . Ranexa [Ranolazine] Nausea Only    Dizziness    Patient Measurements: Height:  (154.9 cm) Weight: 150 lb 12.7 oz (68.4 kg) IBW/kg (Calculated) : 47.8 Heparin Dosing Weight: 62kg  Vital Signs: Temp: 97.9 F (36.6 C) (01/29 1930) Temp Source: Oral (01/29 1930) BP: 103/65 mmHg (01/29 1930) Pulse Rate: 88 (01/29 1930)  Labs:  Recent Labs  01/12/16 1027 01/12/16 1549  01/12/16 2027 01/13/16 0346 01/13/16 1050 01/13/16 1935  HGB 13.4  --   --   --  12.7  --   --   HCT 39.1  --   --   --  37.3  --   --   PLT 213  --   --   --  202  --   --   LABPROT  --   --   --   --   --   --  14.7  INR  --   --   --   --   --   --  1.13  HEPARINUNFRC  --   --   < > 0.14* 0.18* 0.16* 0.34  CREATININE 0.79  --   --   --  0.92  --   --   TROPONINI  --  0.10*  --  0.15* 0.12*  --   --   < > = values in this interval not displayed.  Estimated Creatinine Clearance: 72.6 mL/min (by C-G formula based on Cr of 0.92).   Medications:  Heparin @ 1300 units/hr  Assessment: 39yof continues on heparin for NSTEMI with plans for cath tomorrow. Heparin level is therapeutic at 0.34. Bolus ordered this morning but not actually given.  Goal of Therapy:  Heparin level 0.3-0.7 units/ml Monitor platelets by anticoagulation protocol: Yes   Plan:  1) Continue heparin at 1300 units/hr 2) Follow up daily heparin level and CBC  Fredrik Rigger 01/13/2016,8:23 PM

## 2016-01-13 NOTE — Progress Notes (Signed)
Utilization review completed.  

## 2016-01-13 NOTE — Progress Notes (Addendum)
DAILY PROGRESS NOTE  Subjective:  Chest pain is better today. Very mild troponin elevation overnight up to 0.15. Labs otherwise normal. Tox screen is pending. Cathed twice in 03/2015 - initial PCI to the D1 - spasm was noted. A repeat cath 3 weeks later showed a patent stent but concern for vasospasm. EKG this morning is normal. She said she ran out of isosorbide.   Objective:  Temp:  [97.6 F (36.4 C)-97.9 F (36.6 C)] 97.9 F (36.6 C) (01/29 0340) Pulse Rate:  [67-84] 67 (01/29 0340) Resp:  [12-20] 20 (01/29 0340) BP: (88-127)/(56-85) 88/56 mmHg (01/29 0340) SpO2:  [98 %-100 %] 98 % (01/29 0340) Weight:  [150 lb (68.04 kg)-150 lb 12.7 oz (68.4 kg)] 150 lb 12.7 oz (68.4 kg) (01/29 0340) Weight change:   Intake/Output from previous day:    Intake/Output from this shift:    Medications: Current Facility-Administered Medications  Medication Dose Route Frequency Provider Last Rate Last Dose  . heparin ADULT infusion 100 units/mL (25000 units/250 mL)  1,300 Units/hr Intravenous Continuous Dorothy Spark, MD 10 mL/hr at 01/12/16 2135 1,000 Units/hr at 01/12/16 2135  . heparin bolus via infusion 2,000 Units  2,000 Units Intravenous Once Dorothy Spark, MD      . isosorbide mononitrate (IMDUR) 24 hr tablet 90 mg  90 mg Oral Daily Sulaiman Durenda Age, MD   90 mg at 01/13/16 0830  . oxyCODONE (Oxy IR/ROXICODONE) immediate release tablet 10 mg  10 mg Oral Q8H PRN Sulaiman Durenda Age, MD   10 mg at 01/13/16 1014    Physical Exam: General appearance: alert and no distress Lungs: clear to auscultation bilaterally Heart: regular rate and rhythm, S1, S2 normal, no murmur, click, rub or gallop Extremities: extremities normal, atraumatic, no cyanosis or edema  Lab Results: Results for orders placed or performed during the hospital encounter of 01/12/16 (from the past 48 hour(s))  Basic metabolic panel     Status: Abnormal   Collection Time: 01/12/16 10:27 AM  Result Value Ref  Range   Sodium 139 135 - 145 mmol/L   Potassium 4.2 3.5 - 5.1 mmol/L   Chloride 105 101 - 111 mmol/L   CO2 24 22 - 32 mmol/L   Glucose, Bld 106 (H) 65 - 99 mg/dL   BUN 9 6 - 20 mg/dL   Creatinine, Ser 0.79 0.44 - 1.00 mg/dL   Calcium 9.3 8.9 - 10.3 mg/dL   GFR calc non Af Amer >60 >60 mL/min   GFR calc Af Amer >60 >60 mL/min    Comment: (NOTE) The eGFR has been calculated using the CKD EPI equation. This calculation has not been validated in all clinical situations. eGFR's persistently <60 mL/min signify possible Chronic Kidney Disease.    Anion gap 10 5 - 15  CBC     Status: None   Collection Time: 01/12/16 10:27 AM  Result Value Ref Range   WBC 5.1 4.0 - 10.5 K/uL   RBC 4.40 3.87 - 5.11 MIL/uL   Hemoglobin 13.4 12.0 - 15.0 g/dL   HCT 39.1 36.0 - 46.0 %   MCV 88.9 78.0 - 100.0 fL   MCH 30.5 26.0 - 34.0 pg   MCHC 34.3 30.0 - 36.0 g/dL   RDW 12.5 11.5 - 15.5 %   Platelets 213 150 - 400 K/uL  I-stat troponin, ED (not at [facility])     Status: None   Collection Time: 01/12/16 10:34 AM  Result Value Ref Range   Troponin  i, poc 0.00 0.00 - 0.08 ng/mL   Comment 3            Comment: Due to the release kinetics of cTnI, a negative result within the first hours of the onset of symptoms does not rule out myocardial infarction with certainty. If myocardial infarction is still suspected, repeat the test at appropriate intervals.   Troponin I (q 6hr x 3)     Status: Abnormal   Collection Time: 01/12/16  3:49 PM  Result Value Ref Range   Troponin I 0.10 (H) <0.031 ng/mL    Comment:        PERSISTENTLY INCREASED TROPONIN VALUES IN THE RANGE OF 0.04-0.49 ng/mL CAN BE SEEN IN:       -UNSTABLE ANGINA       -CONGESTIVE HEART FAILURE       -MYOCARDITIS       -CHEST TRAUMA       -ARRYHTHMIAS       -LATE PRESENTING MYOCARDIAL INFARCTION       -COPD   CLINICAL FOLLOW-UP RECOMMENDED.   Troponin I (q 6hr x 3)     Status: Abnormal   Collection Time: 01/12/16  8:27 PM  Result Value  Ref Range   Troponin I 0.15 (H) <0.031 ng/mL    Comment:        PERSISTENTLY INCREASED TROPONIN VALUES IN THE RANGE OF 0.04-0.49 ng/mL CAN BE SEEN IN:       -UNSTABLE ANGINA       -CONGESTIVE HEART FAILURE       -MYOCARDITIS       -CHEST TRAUMA       -ARRYHTHMIAS       -LATE PRESENTING MYOCARDIAL INFARCTION       -COPD   CLINICAL FOLLOW-UP RECOMMENDED.   Heparin level (unfractionated)     Status: Abnormal   Collection Time: 01/12/16  8:27 PM  Result Value Ref Range   Heparin Unfractionated 0.14 (L) 0.30 - 0.70 IU/mL    Comment:        IF HEPARIN RESULTS ARE BELOW EXPECTED VALUES, AND PATIENT DOSAGE HAS BEEN CONFIRMED, SUGGEST FOLLOW UP TESTING OF ANTITHROMBIN III LEVELS.   Troponin I (q 6hr x 3)     Status: Abnormal   Collection Time: 01/13/16  3:46 AM  Result Value Ref Range   Troponin I 0.12 (H) <0.031 ng/mL    Comment:        PERSISTENTLY INCREASED TROPONIN VALUES IN THE RANGE OF 0.04-0.49 ng/mL CAN BE SEEN IN:       -UNSTABLE ANGINA       -CONGESTIVE HEART FAILURE       -MYOCARDITIS       -CHEST TRAUMA       -ARRYHTHMIAS       -LATE PRESENTING MYOCARDIAL INFARCTION       -COPD   CLINICAL FOLLOW-UP RECOMMENDED.   Heparin level (unfractionated)     Status: Abnormal   Collection Time: 01/13/16  3:46 AM  Result Value Ref Range   Heparin Unfractionated 0.18 (L) 0.30 - 0.70 IU/mL    Comment:        IF HEPARIN RESULTS ARE BELOW EXPECTED VALUES, AND PATIENT DOSAGE HAS BEEN CONFIRMED, SUGGEST FOLLOW UP TESTING OF ANTITHROMBIN III LEVELS.   CBC     Status: None   Collection Time: 01/13/16  3:46 AM  Result Value Ref Range   WBC 5.3 4.0 - 10.5 K/uL   RBC 4.18 3.87 - 5.11 MIL/uL   Hemoglobin 12.7  12.0 - 15.0 g/dL   HCT 37.3 36.0 - 46.0 %   MCV 89.2 78.0 - 100.0 fL   MCH 30.4 26.0 - 34.0 pg   MCHC 34.0 30.0 - 36.0 g/dL   RDW 12.6 11.5 - 15.5 %   Platelets 202 150 - 400 K/uL  Basic metabolic panel     Status: None   Collection Time: 01/13/16  3:46 AM  Result  Value Ref Range   Sodium 140 135 - 145 mmol/L   Potassium 3.6 3.5 - 5.1 mmol/L   Chloride 102 101 - 111 mmol/L   CO2 29 22 - 32 mmol/L   Glucose, Bld 99 65 - 99 mg/dL   BUN 9 6 - 20 mg/dL   Creatinine, Ser 0.92 0.44 - 1.00 mg/dL   Calcium 9.0 8.9 - 10.3 mg/dL   GFR calc non Af Amer >60 >60 mL/min   GFR calc Af Amer >60 >60 mL/min    Comment: (NOTE) The eGFR has been calculated using the CKD EPI equation. This calculation has not been validated in all clinical situations. eGFR's persistently <60 mL/min signify possible Chronic Kidney Disease.    Anion gap 9 5 - 15    Imaging: Dg Chest 2 View  01/12/2016  CLINICAL DATA:  Chest pain for 2 days EXAM: CHEST  2 VIEW COMPARISON:  06/09/2015 FINDINGS: The heart size and mediastinal contours are within normal limits. Both lungs are clear. The visualized skeletal structures are unremarkable. IMPRESSION: No active cardiopulmonary disease. Electronically Signed   By: Inez Catalina M.D.   On: 01/12/2016 11:19    Assessment:  Principal Problem:   NSTEMI (non-ST elevated myocardial infarction) [facility]) Active Problems:   Tobacco use disorder   CAD (coronary artery disease)   Plan:  1. Discussed medical therapy versus repeat cardiac catheterization. She had multiple moderate stenoses and is at risk for progression of CAD, although spasm could cause NSTEMI. Will continue aggressive medical therapy. Discussed risks/benefits/alternatives to cath and she is agreeable to proceed tomorrow. Start daily low dose aspirin today.  Time Spent Directly with Patient:  15 minutes  Length of Stay:  LOS: 1 day   Pixie Casino, MD, [facility] Attending Cardiologist [place] 01/13/2016, 11:39 AM

## 2016-01-14 ENCOUNTER — Encounter (HOSPITAL_COMMUNITY): Payer: Self-pay | Admitting: Cardiovascular Disease

## 2016-01-14 ENCOUNTER — Encounter (HOSPITAL_COMMUNITY)
Admission: EM | Disposition: A | Discharge status: Home / Self Care | Payer: Self-pay | Source: Home / Self Care | Attending: Cardiology

## 2016-01-14 HISTORY — PX: CARDIAC CATHETERIZATION: SHX172

## 2016-01-14 LAB — HEPARIN LEVEL (UNFRACTIONATED)

## 2016-01-14 LAB — CBC
HEMATOCRIT: 32.7 % — AB (ref 36.0–46.0)
HEMOGLOBIN: 11.5 g/dL — AB (ref 12.0–15.0)
MCH: 31.3 pg (ref 26.0–34.0)
MCHC: 35.2 g/dL (ref 30.0–36.0)
MCV: 89.1 fL (ref 78.0–100.0)
Platelets: 168 10*3/uL (ref 150–400)
RBC: 3.67 MIL/uL — AB (ref 3.87–5.11)
RDW: 12.5 % (ref 11.5–15.5)
WBC: 5.7 10*3/uL (ref 4.0–10.5)

## 2016-01-14 LAB — POCT ACTIVATED CLOTTING TIME
Activated Clotting Time: 245 seconds
Activated Clotting Time: 281 seconds

## 2016-01-14 LAB — PREGNANCY, URINE: PREG TEST UR: NEGATIVE

## 2016-01-14 SURGERY — CORONARY STENT INTERVENTION

## 2016-01-14 MED ORDER — BUPROPION HCL ER (SR) 100 MG PO TB12
300.0000 mg | ORAL_TABLET | Freq: Every day | ORAL | Status: DC
  Administered 2016-01-14 – 2016-01-15 (×2): 300 mg via ORAL
  Filled 2016-01-14 (×2): qty 3

## 2016-01-14 MED ORDER — SODIUM CHLORIDE 0.9% FLUSH: 3.0000 mL | INTRAVENOUS | Status: DC | PRN

## 2016-01-14 MED ORDER — ALPRAZOLAM 0.5 MG PO TABS
0.5000 mg | ORAL_TABLET | Freq: Three times a day (TID) | ORAL | Status: DC
  Administered 2016-01-14 – 2016-01-15 (×3): 0.5 mg via ORAL
  Filled 2016-01-14 (×4): qty 1

## 2016-01-14 MED ORDER — SODIUM CHLORIDE 0.9% FLUSH
3.0000 mL | Freq: Two times a day (BID) | INTRAVENOUS | Status: DC
  Administered 2016-01-14 (×2): 3 mL via INTRAVENOUS

## 2016-01-14 MED ORDER — NITROGLYCERIN 1 MG/10 ML FOR IR/CATH LAB
INTRA_ARTERIAL | Status: DC | PRN
  Administered 2016-01-14: 200 ug via INTRACORONARY
  Administered 2016-01-14: 150 ug via INTRACORONARY
  Administered 2016-01-14: 200 ug via INTRACORONARY
  Administered 2016-01-14: 200 ug via INTRA_ARTERIAL
  Administered 2016-01-14: 200 ug via INTRACORONARY

## 2016-01-14 MED ORDER — HEPARIN (PORCINE) IN NACL 2-0.9 UNIT/ML-% IJ SOLN
INTRAMUSCULAR | Status: AC
  Filled 2016-01-14: qty 1000

## 2016-01-14 MED ORDER — CLOPIDOGREL BISULFATE 75 MG PO TABS
75.0000 mg | ORAL_TABLET | Freq: Every day | ORAL | Status: DC
  Administered 2016-01-15: 75 mg via ORAL
  Filled 2016-01-14: qty 1

## 2016-01-14 MED ORDER — FENTANYL CITRATE (PF) 100 MCG/2ML IJ SOLN
INTRAMUSCULAR | Status: AC
  Filled 2016-01-14: qty 2

## 2016-01-14 MED ORDER — NITROGLYCERIN 0.4 MG SL SUBL
0.4000 mg | SUBLINGUAL_TABLET | SUBLINGUAL | Status: DC | PRN
  Administered 2016-01-14 (×2): 0.4 mg via SUBLINGUAL
  Filled 2016-01-14: qty 1

## 2016-01-14 MED ORDER — HEPARIN (PORCINE) IN NACL 2-0.9 UNIT/ML-% IJ SOLN
INTRAMUSCULAR | Status: DC | PRN
  Administered 2016-01-14: 10:00:00 via INTRA_ARTERIAL

## 2016-01-14 MED ORDER — LIDOCAINE HCL (PF) 1 % IJ SOLN
INTRAMUSCULAR | Status: AC
  Filled 2016-01-14: qty 30

## 2016-01-14 MED ORDER — PROMETHAZINE HCL 25 MG/ML IJ SOLN
INTRAMUSCULAR | Status: AC
  Filled 2016-01-14: qty 1

## 2016-01-14 MED ORDER — ACETAMINOPHEN 325 MG PO TABS
650.0000 mg | ORAL_TABLET | Freq: Once | ORAL | Status: AC
  Administered 2016-01-14: 650 mg via ORAL
  Filled 2016-01-14: qty 2

## 2016-01-14 MED ORDER — SODIUM CHLORIDE 0.9 % IV SOLN: 250.0000 mL | INTRAVENOUS | Status: DC | PRN

## 2016-01-14 MED ORDER — RANOLAZINE ER 500 MG PO TB12
500.0000 mg | ORAL_TABLET | Freq: Two times a day (BID) | ORAL | Status: DC
  Administered 2016-01-14 – 2016-01-15 (×3): 500 mg via ORAL
  Filled 2016-01-14 (×3): qty 1

## 2016-01-14 MED ORDER — GUAIFENESIN ER 600 MG PO TB12
600.0000 mg | ORAL_TABLET | Freq: Two times a day (BID) | ORAL | Status: DC | PRN
  Filled 2016-01-14: qty 1

## 2016-01-14 MED ORDER — FAMOTIDINE IN NACL 20-0.9 MG/50ML-% IV SOLN
INTRAVENOUS | Status: DC | PRN
  Administered 2016-01-14: 20 mg via INTRAVENOUS

## 2016-01-14 MED ORDER — MIDAZOLAM HCL 2 MG/2ML IJ SOLN
INTRAMUSCULAR | Status: AC
  Filled 2016-01-14: qty 2

## 2016-01-14 MED ORDER — FLUTICASONE PROPIONATE 50 MCG/ACT NA SUSP
2.0000 | Freq: Every day | NASAL | Status: DC | PRN
  Filled 2016-01-14: qty 16

## 2016-01-14 MED ORDER — NITROGLYCERIN 0.4 MG SL SUBL: 0.4000 mg | SUBLINGUAL_TABLET | SUBLINGUAL | Status: DC | PRN

## 2016-01-14 MED ORDER — CLOPIDOGREL BISULFATE 300 MG PO TABS
ORAL_TABLET | ORAL | Status: DC | PRN
Start: 2016-01-14 — End: 2016-01-14
  Administered 2016-01-14: 300 mg via ORAL

## 2016-01-14 MED ORDER — VERAPAMIL HCL 2.5 MG/ML IV SOLN
INTRAVENOUS | Status: AC
  Filled 2016-01-14: qty 2

## 2016-01-14 MED ORDER — NITROGLYCERIN 1 MG/10 ML FOR IR/CATH LAB
INTRA_ARTERIAL | Status: AC
  Filled 2016-01-14: qty 10

## 2016-01-14 MED ORDER — IOHEXOL 350 MG/ML SOLN
INTRAVENOUS | Status: DC | PRN
  Administered 2016-01-14: 140 mL via INTRACARDIAC

## 2016-01-14 MED ORDER — FAMOTIDINE IN NACL 20-0.9 MG/50ML-% IV SOLN
INTRAVENOUS | Status: AC
  Filled 2016-01-14: qty 50

## 2016-01-14 MED ORDER — ESCITALOPRAM OXALATE 10 MG PO TABS
10.0000 mg | ORAL_TABLET | Freq: Every day | ORAL | Status: DC
  Administered 2016-01-14: 12:00:00 10 mg via ORAL
  Filled 2016-01-14 (×2): qty 1

## 2016-01-14 MED ORDER — FENTANYL CITRATE (PF) 100 MCG/2ML IJ SOLN
INTRAMUSCULAR | Status: DC | PRN
  Administered 2016-01-14: 50 ug via INTRAVENOUS
  Administered 2016-01-14: 25 ug via INTRAVENOUS
  Administered 2016-01-14 (×2): 50 ug via INTRAVENOUS

## 2016-01-14 MED ORDER — VERAPAMIL HCL 2.5 MG/ML IV SOLN
INTRAVENOUS | Status: DC | PRN
  Administered 2016-01-14: 09:00:00 via INTRA_ARTERIAL

## 2016-01-14 MED ORDER — LIDOCAINE HCL (PF) 1 % IJ SOLN
INTRAMUSCULAR | Status: DC | PRN
  Administered 2016-01-14: 11:00:00

## 2016-01-14 MED ORDER — MORPHINE SULFATE (PF) 2 MG/ML IV SOLN: 2.0000 mg | INTRAVENOUS | Status: DC | PRN

## 2016-01-14 MED ORDER — PROMETHAZINE HCL 25 MG/ML IJ SOLN
INTRAMUSCULAR | Status: DC | PRN
Start: 2016-01-14 — End: 2016-01-14
  Administered 2016-01-14: 12.5 mg via INTRAVENOUS

## 2016-01-14 MED ORDER — SODIUM CHLORIDE 0.9 % IV SOLN: INTRAVENOUS | Status: AC

## 2016-01-14 MED ORDER — BUPROPION HCL ER (SR) 150 MG PO TB12: 150.0000 mg | ORAL_TABLET | Freq: Two times a day (BID) | ORAL | Status: DC

## 2016-01-14 MED ORDER — ONDANSETRON HCL 4 MG/2ML IJ SOLN
4.0000 mg | Freq: Once | INTRAMUSCULAR | Status: AC
  Administered 2016-01-14: 4 mg via INTRAVENOUS
  Filled 2016-01-14: qty 2

## 2016-01-14 MED ORDER — PANTOPRAZOLE SODIUM 40 MG PO TBEC
40.0000 mg | DELAYED_RELEASE_TABLET | Freq: Two times a day (BID) | ORAL | Status: DC
  Administered 2016-01-14 – 2016-01-15 (×3): 40 mg via ORAL
  Filled 2016-01-14 (×3): qty 1

## 2016-01-14 MED ORDER — AMLODIPINE BESYLATE 5 MG PO TABS
5.0000 mg | ORAL_TABLET | Freq: Every day | ORAL | Status: DC
  Administered 2016-01-14 – 2016-01-15 (×2): 5 mg via ORAL
  Filled 2016-01-14 (×2): qty 1

## 2016-01-14 MED ORDER — ASPIRIN EC 81 MG PO TBEC: 81.0000 mg | DELAYED_RELEASE_TABLET | Freq: Every day | ORAL | Status: DC

## 2016-01-14 MED ORDER — ONDANSETRON HCL 4 MG/2ML IJ SOLN
INTRAMUSCULAR | Status: DC | PRN
  Administered 2016-01-14: 4 mg via INTRAVENOUS

## 2016-01-14 MED ORDER — ISOSORBIDE MONONITRATE ER 60 MG PO TB24
90.0000 mg | ORAL_TABLET | Freq: Every day | ORAL | Status: DC
  Administered 2016-01-15: 10:00:00 90 mg via ORAL
  Filled 2016-01-14 (×2): qty 1

## 2016-01-14 MED ORDER — ONDANSETRON HCL 4 MG/2ML IJ SOLN: 4.0000 mg | Freq: Four times a day (QID) | INTRAMUSCULAR | Status: DC | PRN

## 2016-01-14 MED ORDER — ATORVASTATIN CALCIUM 20 MG PO TABS
20.0000 mg | ORAL_TABLET | Freq: Every day | ORAL | Status: DC
  Administered 2016-01-14: 20 mg via ORAL
  Filled 2016-01-14: qty 1

## 2016-01-14 MED ORDER — CLOPIDOGREL BISULFATE 300 MG PO TABS
ORAL_TABLET | ORAL | Status: AC
  Filled 2016-01-14: qty 1

## 2016-01-14 MED ORDER — ACETAMINOPHEN 325 MG PO TABS: 650.0000 mg | ORAL_TABLET | ORAL | Status: DC | PRN

## 2016-01-14 MED ORDER — NITROGLYCERIN 0.4 MG SL SUBL
SUBLINGUAL_TABLET | SUBLINGUAL | Status: AC
  Filled 2016-01-14: qty 1

## 2016-01-14 MED ORDER — HEPARIN SODIUM (PORCINE) 1000 UNIT/ML IJ SOLN
INTRAMUSCULAR | Status: AC
  Filled 2016-01-14: qty 1

## 2016-01-14 MED ORDER — BUPROPION HCL ER (SR) 150 MG PO TB12
150.0000 mg | ORAL_TABLET | Freq: Every evening | ORAL | Status: DC
  Administered 2016-01-14: 22:00:00 150 mg via ORAL
  Filled 2016-01-14 (×2): qty 1

## 2016-01-14 MED ORDER — MIDAZOLAM HCL 2 MG/2ML IJ SOLN
INTRAMUSCULAR | Status: DC | PRN
  Administered 2016-01-14: 1 mg via INTRAVENOUS
  Administered 2016-01-14: 2 mg via INTRAVENOUS
  Administered 2016-01-14: 1 mg via INTRAVENOUS
  Administered 2016-01-14: 2 mg via INTRAVENOUS

## 2016-01-14 MED ORDER — HEPARIN SODIUM (PORCINE) 1000 UNIT/ML IJ SOLN
INTRAMUSCULAR | Status: DC | PRN
  Administered 2016-01-14: 4000 [IU] via INTRAVENOUS
  Administered 2016-01-14: 3000 [IU] via INTRAVENOUS
  Administered 2016-01-14: 2000 [IU] via INTRAVENOUS

## 2016-01-14 SURGICAL SUPPLY — 17 items
BALLN EUPHORA RX 2.0X15 (BALLOONS) ×4
BALLOON EUPHORA RX 2.0X15 (BALLOONS) IMPLANT
CATH IMPULSE 5F ANG/FL3.5 (CATHETERS) ×2 IMPLANT
CATH LAUNCHER 5F EBU3.5 (CATHETERS) ×2 IMPLANT
DEVICE RAD COMP TR BAND LRG (VASCULAR PRODUCTS) ×4 IMPLANT
GLIDESHEATH SLEND SS 6F .021 (SHEATH) ×4 IMPLANT
KIT ENCORE 26 ADVANTAGE (KITS) ×2 IMPLANT
KIT HEART LEFT (KITS) ×4 IMPLANT
PACK CARDIAC CATHETERIZATION (CUSTOM PROCEDURE TRAY) ×4 IMPLANT
STENT PROMUS PREM MR 2.25X24 (Permanent Stent) ×2 IMPLANT
STENT PROMUS PREM MR 2.5X16 (Permanent Stent) ×2 IMPLANT
SYR MEDRAD MARK V 150ML (SYRINGE) ×4 IMPLANT
TRANSDUCER W/STOPCOCK (MISCELLANEOUS) ×4 IMPLANT
TUBING CIL FLEX 10 FLL-RA (TUBING) ×4 IMPLANT
WIRE COUGAR XT STRL 190CM (WIRE) ×4 IMPLANT
WIRE HI TORQ VERSACORE-J 145CM (WIRE) ×2 IMPLANT
WIRE SAFE-T 1.5MM-J .035X260CM (WIRE) ×4 IMPLANT

## 2016-01-14 NOTE — Progress Notes (Signed)
ANTICOAGULATION CONSULT NOTE - Follow Up Consult  Pharmacy Consult for Heparin Indication: chest pain/ACS  Allergies  Allergen Reactions  . Ranexa [Ranolazine] Nausea Only    Dizziness    Patient Measurements: Height:  (154.9 cm) Weight: 153 lb (69.4 kg) IBW/kg (Calculated) : 47.8 Heparin Dosing Weight: 62kg  Vital Signs: Temp: 97.5 F (36.4 C) (01/30 0350) Temp Source: Oral (01/30 0350) BP: 100/58 mmHg (01/30 0350) Pulse Rate: 82 (01/30 0350)  Labs:  Recent Labs  01/12/16 1027 01/12/16 1549  01/12/16 2027 01/13/16 0346 01/13/16 1050 01/13/16 1935 01/14/16 0538  HGB 13.4  --   --   --  12.7  --   --  11.5*  HCT 39.1  --   --   --  37.3  --   --  32.7*  PLT 213  --   --   --  202  --   --  168  LABPROT  --   --   --   --   --   --  14.7  --   INR  --   --   --   --   --   --  1.13  --   HEPARINUNFRC  --   --   < > 0.14* 0.18* 0.16* 0.34 <0.10*  CREATININE 0.79  --   --   --  0.92  --   --   --   TROPONINI  --  0.10*  --  0.15* 0.12*  --   --   --   < > = values in this interval not displayed.  Estimated Creatinine Clearance: 73.1 mL/min (by C-G formula based on Cr of 0.92).    Assessment: 39yof continues on heparin for NSTEMI with plans for cath tomorrow.  Heparin level now < 0.10  Goal of Therapy:  Heparin level 0.3-0.7 units/ml Monitor platelets by anticoagulation protocol: Yes   Plan:  1) Heparin to 1550 units / hr 2) Follow up after cath  Elwin Sleight 01/14/2016,6:31 AM

## 2016-01-14 NOTE — H&P (View-Only) (Signed)
DAILY PROGRESS NOTE  Subjective:  Chest pain is better today. Very mild troponin elevation overnight up to 0.15. Labs otherwise normal. Tox screen is pending. Cathed twice in 03/2015 - initial PCI to the D1 - spasm was noted. A repeat cath 3 weeks later showed a patent stent but concern for vasospasm. EKG this morning is normal. She said she ran out of isosorbide.   Objective:  Temp:  [97.6 F (36.4 C)-97.9 F (36.6 C)] 97.9 F (36.6 C) (01/29 0340) Pulse Rate:  [67-84] 67 (01/29 0340) Resp:  [12-20] 20 (01/29 0340) BP: (88-127)/(56-85) 88/56 mmHg (01/29 0340) SpO2:  [98 %-100 %] 98 % (01/29 0340) Weight:  [150 lb (68.04 kg)-150 lb 12.7 oz (68.4 kg)] 150 lb 12.7 oz (68.4 kg) (01/29 0340) Weight change:   Intake/Output from previous day:    Intake/Output from this shift:    Medications: Current Facility-Administered Medications  Medication Dose Route Frequency Provider Last Rate Last Dose  . heparin ADULT infusion 100 units/mL (25000 units/250 mL)  1,300 Units/hr Intravenous Continuous Dorothy Spark, MD 10 mL/hr at 01/12/16 2135 1,000 Units/hr at 01/12/16 2135  . heparin bolus via infusion 2,000 Units  2,000 Units Intravenous Once Dorothy Spark, MD      . isosorbide mononitrate (IMDUR) 24 hr tablet 90 mg  90 mg Oral Daily Sulaiman Durenda Age, MD   90 mg at 01/13/16 0830  . oxyCODONE (Oxy IR/ROXICODONE) immediate release tablet 10 mg  10 mg Oral Q8H PRN Sulaiman Durenda Age, MD   10 mg at 01/13/16 1014    Physical Exam: General appearance: alert and no distress Lungs: clear to auscultation bilaterally Heart: regular rate and rhythm, S1, S2 normal, no murmur, click, rub or gallop Extremities: extremities normal, atraumatic, no cyanosis or edema  Lab Results: Results for orders placed or performed during the hospital encounter of 01/12/16 (from the past 48 hour(s))  Basic metabolic panel     Status: Abnormal   Collection Time: 01/12/16 10:27 AM  Result Value Ref  Range   Sodium 139 135 - 145 mmol/L   Potassium 4.2 3.5 - 5.1 mmol/L   Chloride 105 101 - 111 mmol/L   CO2 24 22 - 32 mmol/L   Glucose, Bld 106 (H) 65 - 99 mg/dL   BUN 9 6 - 20 mg/dL   Creatinine, Ser 0.79 0.44 - 1.00 mg/dL   Calcium 9.3 8.9 - 10.3 mg/dL   GFR calc non Af Amer >60 >60 mL/min   GFR calc Af Amer >60 >60 mL/min    Comment: (NOTE) The eGFR has been calculated using the CKD EPI equation. This calculation has not been validated in all clinical situations. eGFR's persistently <60 mL/min signify possible Chronic Kidney Disease.    Anion gap 10 5 - 15  CBC     Status: None   Collection Time: 01/12/16 10:27 AM  Result Value Ref Range   WBC 5.1 4.0 - 10.5 K/uL   RBC 4.40 3.87 - 5.11 MIL/uL   Hemoglobin 13.4 12.0 - 15.0 g/dL   HCT 39.1 36.0 - 46.0 %   MCV 88.9 78.0 - 100.0 fL   MCH 30.5 26.0 - 34.0 pg   MCHC 34.3 30.0 - 36.0 g/dL   RDW 12.5 11.5 - 15.5 %   Platelets 213 150 - 400 K/uL  I-stat troponin, ED (not at [facility])     Status: None   Collection Time: 01/12/16 10:34 AM  Result Value Ref Range   Troponin  i, poc 0.00 0.00 - 0.08 ng/mL   Comment 3            Comment: Due to the release kinetics of cTnI, a negative result within the first hours of the onset of symptoms does not rule out myocardial infarction with certainty. If myocardial infarction is still suspected, repeat the test at appropriate intervals.   Troponin I (q 6hr x 3)     Status: Abnormal   Collection Time: 01/12/16  3:49 PM  Result Value Ref Range   Troponin I 0.10 (H) <0.031 ng/mL    Comment:        PERSISTENTLY INCREASED TROPONIN VALUES IN THE RANGE OF 0.04-0.49 ng/mL CAN BE SEEN IN:       -UNSTABLE ANGINA       -CONGESTIVE HEART FAILURE       -MYOCARDITIS       -CHEST TRAUMA       -ARRYHTHMIAS       -LATE PRESENTING MYOCARDIAL INFARCTION       -COPD   CLINICAL FOLLOW-UP RECOMMENDED.   Troponin I (q 6hr x 3)     Status: Abnormal   Collection Time: 01/12/16  8:27 PM  Result Value  Ref Range   Troponin I 0.15 (H) <0.031 ng/mL    Comment:        PERSISTENTLY INCREASED TROPONIN VALUES IN THE RANGE OF 0.04-0.49 ng/mL CAN BE SEEN IN:       -UNSTABLE ANGINA       -CONGESTIVE HEART FAILURE       -MYOCARDITIS       -CHEST TRAUMA       -ARRYHTHMIAS       -LATE PRESENTING MYOCARDIAL INFARCTION       -COPD   CLINICAL FOLLOW-UP RECOMMENDED.   Heparin level (unfractionated)     Status: Abnormal   Collection Time: 01/12/16  8:27 PM  Result Value Ref Range   Heparin Unfractionated 0.14 (L) 0.30 - 0.70 IU/mL    Comment:        IF HEPARIN RESULTS ARE BELOW EXPECTED VALUES, AND PATIENT DOSAGE HAS BEEN CONFIRMED, SUGGEST FOLLOW UP TESTING OF ANTITHROMBIN III LEVELS.   Troponin I (q 6hr x 3)     Status: Abnormal   Collection Time: 01/13/16  3:46 AM  Result Value Ref Range   Troponin I 0.12 (H) <0.031 ng/mL    Comment:        PERSISTENTLY INCREASED TROPONIN VALUES IN THE RANGE OF 0.04-0.49 ng/mL CAN BE SEEN IN:       -UNSTABLE ANGINA       -CONGESTIVE HEART FAILURE       -MYOCARDITIS       -CHEST TRAUMA       -ARRYHTHMIAS       -LATE PRESENTING MYOCARDIAL INFARCTION       -COPD   CLINICAL FOLLOW-UP RECOMMENDED.   Heparin level (unfractionated)     Status: Abnormal   Collection Time: 01/13/16  3:46 AM  Result Value Ref Range   Heparin Unfractionated 0.18 (L) 0.30 - 0.70 IU/mL    Comment:        IF HEPARIN RESULTS ARE BELOW EXPECTED VALUES, AND PATIENT DOSAGE HAS BEEN CONFIRMED, SUGGEST FOLLOW UP TESTING OF ANTITHROMBIN III LEVELS.   CBC     Status: None   Collection Time: 01/13/16  3:46 AM  Result Value Ref Range   WBC 5.3 4.0 - 10.5 K/uL   RBC 4.18 3.87 - 5.11 MIL/uL   Hemoglobin 12.7  12.0 - 15.0 g/dL   HCT 37.3 36.0 - 46.0 %   MCV 89.2 78.0 - 100.0 fL   MCH 30.4 26.0 - 34.0 pg   MCHC 34.0 30.0 - 36.0 g/dL   RDW 12.6 11.5 - 15.5 %   Platelets 202 150 - 400 K/uL  Basic metabolic panel     Status: None   Collection Time: 01/13/16  3:46 AM  Result  Value Ref Range   Sodium 140 135 - 145 mmol/L   Potassium 3.6 3.5 - 5.1 mmol/L   Chloride 102 101 - 111 mmol/L   CO2 29 22 - 32 mmol/L   Glucose, Bld 99 65 - 99 mg/dL   BUN 9 6 - 20 mg/dL   Creatinine, Ser 0.92 0.44 - 1.00 mg/dL   Calcium 9.0 8.9 - 10.3 mg/dL   GFR calc non Af Amer >60 >60 mL/min   GFR calc Af Amer >60 >60 mL/min    Comment: (NOTE) The eGFR has been calculated using the CKD EPI equation. This calculation has not been validated in all clinical situations. eGFR's persistently <60 mL/min signify possible Chronic Kidney Disease.    Anion gap 9 5 - 15    Imaging: Dg Chest 2 View  01/12/2016  CLINICAL DATA:  Chest pain for 2 days EXAM: CHEST  2 VIEW COMPARISON:  06/09/2015 FINDINGS: The heart size and mediastinal contours are within normal limits. Both lungs are clear. The visualized skeletal structures are unremarkable. IMPRESSION: No active cardiopulmonary disease. Electronically Signed   By: Inez Catalina M.D.   On: 01/12/2016 11:19    Assessment:  Principal Problem:   NSTEMI (non-ST elevated myocardial infarction) [facility]) Active Problems:   Tobacco use disorder   CAD (coronary artery disease)   Plan:  1. Discussed medical therapy versus repeat cardiac catheterization. She had multiple moderate stenoses and is at risk for progression of CAD, although spasm could cause NSTEMI. Will continue aggressive medical therapy. Discussed risks/benefits/alternatives to cath and she is agreeable to proceed tomorrow. Start daily low dose aspirin today.  Time Spent Directly with Patient:  15 minutes  Length of Stay:  LOS: 1 day   Pixie Casino, MD, [facility] Attending Cardiologist [place] 01/13/2016, 11:39 AM

## 2016-01-14 NOTE — Interval H&P Note (Signed)
Cath Lab Visit (complete for each Cath Lab visit)  Clinical Evaluation Leading to the Procedure:   ACS: Yes.    Non-ACS:    Anginal Classification: CCS IV  Anti-ischemic medical therapy: Maximal Therapy (2 or more classes of medications)  Non-Invasive Test Results: No non-invasive testing performed  Prior CABG: No previous CABG      History and Physical Interval Note:  01/14/2016 9:12 AM  Christina Morrison  has presented today for surgery, with the diagnosis of unstable angina  The various methods of treatment have been discussed with the patient and family. After consideration of risks, benefits and other options for treatment, the patient has consented to  Procedure(s): Left Heart Cath and Coronary Angiography (N/A) as a surgical intervention .  The patient's history has been reviewed, patient examined, no change in status, stable for surgery.  I have reviewed the patient's chart and labs.  Questions were answered to the patient's satisfaction.     Christina Morrison

## 2016-01-14 NOTE — Progress Notes (Signed)
TR BAND REMOVAL  LOCATION:    left radial  DEFLATED PER PROTOCOL:    Yes.    TIME BAND OFF / DRESSING APPLIED:    1500   SITE UPON ARRIVAL:    Level 0  SITE AFTER BAND REMOVAL:    Level 0  CIRCULATION SENSATION AND MOVEMENT:    Within Normal Limits   Yes.    COMMENTS:   Tolerated procedure well

## 2016-01-15 LAB — BASIC METABOLIC PANEL
ANION GAP: 12 (ref 5–15)
BUN: 7 mg/dL (ref 6–20)
CHLORIDE: 103 mmol/L (ref 101–111)
CO2: 25 mmol/L (ref 22–32)
Calcium: 9.2 mg/dL (ref 8.9–10.3)
Creatinine, Ser: 0.74 mg/dL (ref 0.44–1.00)
GFR calc non Af Amer: >60 mL/min (ref >60)
GLUCOSE: 100 mg/dL — AB (ref 65–99)
POTASSIUM: 3.7 mmol/L (ref 3.5–5.1)
Sodium: 140 mmol/L (ref 135–145)

## 2016-01-15 LAB — CBC
HEMATOCRIT: 34.1 % — AB (ref 36.0–46.0)
HEMOGLOBIN: 11.7 g/dL — AB (ref 12.0–15.0)
MCH: 30.6 pg (ref 26.0–34.0)
MCHC: 34.3 g/dL (ref 30.0–36.0)
MCV: 89.3 fL (ref 78.0–100.0)
Platelets: 201 10*3/uL (ref 150–400)
RBC: 3.82 MIL/uL — ABNORMAL LOW (ref 3.87–5.11)
RDW: 12.5 % (ref 11.5–15.5)
WBC: 6.3 10*3/uL (ref 4.0–10.5)

## 2016-01-15 MED ORDER — ANGIOPLASTY BOOK
Freq: Once | Status: AC
  Administered 2016-01-15: 02:00:00
  Filled 2016-01-15: qty 1

## 2016-01-15 MED ORDER — ISOSORBIDE MONONITRATE ER 30 MG PO TB24: 90.0000 mg | ORAL_TABLET | Freq: Every day | ORAL | Status: DC

## 2016-01-15 MED ORDER — ACETAMINOPHEN 325 MG PO TABS: 650.0000 mg | ORAL_TABLET | ORAL | Status: DC | PRN

## 2016-01-15 MED FILL — ISOSORBIDE MN ER 30 MG TAB: 30 | 30 days supply | Qty: 90 | Fill #0

## 2016-01-15 NOTE — Progress Notes (Signed)
CARDIAC REHAB PHASE I   PRE:  Rate/Rhythm: 83 SR  BP:  Sitting: 105/62        SaO2: 100 RA  MODE:  Ambulation: 500 ft   POST:  Rate/Rhythm: 88 SR  BP:  Sitting: 115/72         SaO2: 100 RA  Pt ambulated 500 ft on RA, independent, steady gait, tolerated well.  Pt c/o of mild DOE (improved with rest), denies cp, dizziness, declined rest stop. Completed MI/stent education with pt and significant other at bedside.  Reviewed risk factors, tobacco cessation (pt states she has decreased her smoking from 1 pack/day to 5 cigarettes/day), stress management, anti-platelet therapy, stent card, activity restrictions, ntg, exercise, heart healthy diet, and phase 2 cardiac rehab. Pt verbalized understanding, more receptive to education, states she has made changes since her last admissions in April 2016 and continues to do so. Pt agrees to phase 2 cardiac rehab referral, will send to [facility]. Provided pt with financial assistance form since pt states she has a pending appeal for denial of her Medicaid application. Pt to bed after walk, per pt request, call bell within reach.   [id number] [name], RN, BSN 01/15/2016 8:44 AM

## 2016-01-15 NOTE — Progress Notes (Signed)
Subjective:  No chest pain overnight  Objective:  Vital Signs in the last 24 hours: Temp:  [97.9 F (36.6 C)-98.1 F (36.7 C)] 98 F (36.7 C) (01/31 0739) Pulse Rate:  [0-100] 75 (01/31 0739) Resp:  [10-87] 16 (01/31 0739) BP: (91-143)/(53-89) 91/53 mmHg (01/31 0739) SpO2:  [0 %-100 %] 99 % (01/31 0739) Weight:  [155 lb 6.8 oz (70.5 kg)] 155 lb 6.8 oz (70.5 kg) (01/31 0410)  Intake/Output from previous day:  Intake/Output Summary (Last 24 hours) at 01/15/16 0759 Last data filed at 01/15/16 0740  Gross per 24 hour  Intake    840 ml  Output      0 ml  Net    840 ml    Physical Exam: General appearance: alert, cooperative and no distress Lungs: clear to auscultation bilaterally Heart: regular rate and rhythm Extremities: some ecchymosis Lt RA site, no hematoma Neurologic: Grossly normal   Rate: 74  Rhythm: normal sinus rhythm  Lab Results:  Recent Labs  01/14/16 0538 01/15/16 0358  WBC 5.7 6.3  HGB 11.5* 11.7*  PLT 168 201    Recent Labs  01/13/16 0346 01/15/16 0358  NA 140 140  K 3.6 3.7  CL 102 103  CO2 29 25  GLUCOSE 99 100*  BUN 9 7  CREATININE 0.92 0.74    Recent Labs  01/12/16 2027 01/13/16 0346  TROPONINI 0.15* 0.12*    Recent Labs  01/13/16 1935  INR 1.13    Scheduled Meds: . ALPRAZolam  0.5 mg Oral TID  . amLODipine  5 mg Oral Daily  . aspirin  81 mg Oral Daily  . atorvastatin  20 mg Oral q1800  . buPROPion  150 mg Oral QPM  . buPROPion  300 mg Oral Daily  . clopidogrel  75 mg Oral Q breakfast  . escitalopram  10 mg Oral Daily  . isosorbide mononitrate  90 mg Oral Daily  . pantoprazole  40 mg Oral BID AC  . ranolazine  500 mg Oral BID  . sodium chloride flush  3 mL Intravenous Q12H   Continuous Infusions:  PRN Meds:.sodium chloride, acetaminophen, fluticasone, guaiFENesin, morphine injection, nitroGLYCERIN, ondansetron (ZOFRAN) IV, oxyCODONE, sodium chloride flush   Imaging: Imaging results have been  reviewed   Assessment/Plan:  40 y.o. female who is well known to our service, followed by Dr Excell Seltzer. She has known CAD. She had NSTEMI in April 2016 associated with cocaine use. She was found to have severe diagonal stenosis at cath and underwent PCI (bare metal stent). Repeat cath was done later that month for recurrent angina (most likely due to missing her long acting nitrate therapy for a week) with stable coronary anatomy. She did have moderate LCx stenosis then as well with normal LVF.   She was admitted 01/12/16 with [place] and ruled in for a NSTEMI (Troponin pk 0.15- UDS negative for cocaine). Cath revealed 90% CFX stenosis treated with overlapping DES. She had mils residual RCA and LAD disease with 75% OM1.                  Principal Problem:   Unstable angina (HCC) Active Problems:   NSTEMI (non-ST elevated myocardial infarction) (HCC)   CAD S/P percutaneous coronary angioplasty   Anxiety and depression   Tobacco use disorder   Dyslipidemia   PLAN: Discharge, f/u with APP in 1-2 weeks and Dr Excell Seltzer in 3 months. She had just recently started Lipitor 20 mg- check lipids and LFTs in 3  months. Hold on Ranexa for now, continue Imdur.   Corine Shelter PA-C 01/15/2016, 7:59 AM [phone]  I have personally seen and examined this patient with Corine Shelter, PA-C. I agree with the assessment and plan as outlined above. She is stable this am post PCI. Will continue ASA, Plavix, statin, Ranexa. Discharge home today. Follow up Dr. Excell Seltzer. Smoking cessation.   MCALHANY,CHRISTOPHER 01/15/2016 9:17 AM

## 2016-01-15 NOTE — Discharge Summary (Signed)
Discharge Summary    Patient ID: Illiana Morrison,  MRN: [id number], DOB/AGE: [birth year] 40 y.o.  Admit date: 01/12/2016 Discharge date: 01/15/2016  Primary Care Provider: Pearson Grippe Primary Cardiologist: Dr Excell Seltzer  Discharge Diagnoses    Principal Problem:   Unstable angina [facility]) Active Problems:   NSTEMI (non-ST elevated myocardial infarction) [facility])   CAD S/P percutaneous coronary angioplasty   Anxiety and depression   Tobacco use disorder   Dyslipidemia   Allergies Allergies  Allergen Reactions  . Ranexa [Ranolazine] Nausea Only    Dizziness    Diagnostic Studies/Procedures    Coronary angiogram with CFX DES 01/14/16 _____________   History of Present Illness     40 y.o. female who is well known to our service, followed by Dr Excell Seltzer. She has known CAD. She had NSTEMI in April 2016 associated with cocaine use. She was found to have severe diagonal stenosis at cath and underwent PCI (bare metal stent). Repeat cath was done later that month for recurrent angina (most likely due to missing her long acting nitrate therapy for a week) with stable coronary anatomy. She did have moderate LCx stenosis then as well with normal LVF.       Hospital Course     She was admitted 01/12/16 with [place] and ruled in for a NSTEMI (Troponin pk 0.15- UDS negative for cocaine). Cath revealed 90% CFX stenosis treated with overlapping DES. She had mild residual RCA and LAD disease with 75% OM1. She tolerated this well. On 01/15/16 she was ambulated without problem. At discharge it was noted her LDL was 102 but she told me she had just started Lipitor 20 mg. She has had some chronic pain issues so will copntiue at this dose and check lipids and LFTs in 30 months. She also has an Rx for Ranexa 1gm BID which she says she can't tolerate. I suggested she just continue the Imdur for now but will decrease the dose from 90 mg to 30 mg. We discussed the importance of medication compliance and  smoking cessation.  Discharge Vitals Blood pressure 91/53, pulse 75, temperature 98 F (36.7 C), temperature source Oral, resp. rate 16, height  (1.549 m), weight 155 lb 6.8 oz (70.5 kg), last menstrual period 12/22/2015, SpO2 99 %.  Filed Weights   01/13/16 0340 01/14/16 0400 01/15/16 0410  Weight: 150 lb 12.7 oz (68.4 kg) 153 lb (69.4 kg) 155 lb 6.8 oz (70.5 kg)    Labs & Radiologic Studies     CBC  Recent Labs  01/14/16 0538 01/15/16 0358  WBC 5.7 6.3  HGB 11.5* 11.7*  HCT 32.7* 34.1*  MCV 89.1 89.3  PLT 168 201   Basic Metabolic Panel  Recent Labs  01/13/16 0346 01/15/16 0358  NA 140 140  K 3.6 3.7  CL 102 103  CO2 29 25  GLUCOSE 99 100*  BUN 9 7  CREATININE 0.92 0.74  CALCIUM 9.0 9.2   Liver Function Tests No results for input(s): AST, ALT, ALKPHOS, BILITOT, PROT, ALBUMIN in the last 72 hours. No results for input(s): LIPASE, AMYLASE in the last 72 hours. Cardiac Enzymes  Recent Labs  01/12/16 1549 01/12/16 2027 01/13/16 0346  TROPONINI 0.10* 0.15* 0.12*   BNP Invalid input(s): POCBNP D-Dimer No results for input(s): DDIMER in the last 72 hours. Hemoglobin A1C No results for input(s): HGBA1C in the last 72 hours. Fasting Lipid Panel No results for input(s): CHOL, HDL, LDLCALC, TRIG, CHOLHDL, LDLDIRECT in the last 72 hours.  Thyroid Function Tests No results for input(s): TSH, T4TOTAL, T3FREE, THYROIDAB in the last 72 hours.  Invalid input(s): FREET3  Dg Chest 2 View  01/12/2016  CLINICAL DATA:  Chest pain for 2 days EXAM: CHEST  2 VIEW COMPARISON:  06/09/2015 FINDINGS: The heart size and mediastinal contours are within normal limits. Both lungs are clear. The visualized skeletal structures are unremarkable. IMPRESSION: No active cardiopulmonary disease. Electronically Signed   By: Alcide Clever M.D.   On: 01/12/2016 11:19    Disposition   Pt is being discharged home today in good condition.  Follow-up Plans & Appointments     Follow-up Information    Follow up with [name], MD.   Specialty:  Cardiology   Why:  office will contact you    Contact information:   [address] [place] [address] [phone]        Discharge Medications   Current Discharge Medication List    START taking these medications   Details  acetaminophen (TYLENOL) 325 MG tablet Take 2 tablets (650 mg total) by mouth every 4 (four) hours as needed for headache or mild pain.      CONTINUE these medications which have CHANGED   Details  isosorbide mononitrate (IMDUR) 30 MG 24 hr tablet Take 3 tablets (90 mg total) by mouth daily. Qty: 30 tablet, Refills: 5   Associated Diagnoses: Coronary artery disease due to lipid rich plaque; Hyperlipemia      CONTINUE these medications which have NOT CHANGED   Details  ALPRAZolam (XANAX) 0.5 MG tablet Take 0.5 mg by mouth 3 (three) times daily.    amLODipine (NORVASC) 5 MG tablet Take 1 tablet (5 mg total) by mouth daily. Qty: 90 tablet, Refills: 2   Associated Diagnoses: Coronary artery disease due to lipid rich plaque; Hyperlipemia    aspirin EC 81 MG EC tablet Take 1 tablet (81 mg total) by mouth daily.    atorvastatin (LIPITOR) 20 MG tablet Take 1 tablet (20 mg total) by mouth daily. Qty: 90 tablet, Refills: 3    buPROPion (WELLBUTRIN SR) 150 MG 12 hr tablet Take 1-2 tablets (150-300 mg total) by mouth 2 (two) times daily. Takes  in am and  in pm Qty: 60 tablet, Refills: 3    clopidogrel (PLAVIX) 75 MG tablet Take 1 tablet (75 mg total) by mouth daily with breakfast. Qty: 90 tablet, Refills: 2   Associated Diagnoses: Coronary artery disease due to lipid rich plaque; Hyperlipemia    escitalopram (LEXAPRO) 10 MG tablet Take 10 mg by mouth daily.    fluticasone (FLONASE) 50 MCG/ACT nasal spray Place 2 sprays into both nostrils daily as needed for allergies or rhinitis.    guaiFENesin (MUCINEX) 600 MG 12 hr tablet Take 600 mg by mouth 2  (two) times daily as needed for cough or to loosen phlegm.    nitroGLYCERIN (NITROSTAT) 0.4 MG SL tablet Place 1 tablet (0.4 mg total) under the tongue every 5 (five) minutes as needed for chest pain. Qty: 25 tablet, Refills: 3    Oxycodone HCl 10 MG TABS Take 10 mg by mouth 3 (three) times daily as needed (for pain).     pantoprazole (PROTONIX) 40 MG tablet Take 1 tablet (40 mg total) by mouth 2 (two) times daily before a meal. Qty: 180 tablet, Refills: 2   Associated Diagnoses: Coronary artery disease due to lipid rich plaque; Hyperlipemia      STOP taking these medications     ranolazine (  RANEXA) 1000 MG SR tablet          Aspirin prescribed at discharge?  Yes High Intensity Statin Prescribed? (Lipitor 40-80mg  or Crestor 20-40mg ): No: just started on low dose statin, history of chronic pain problems so started off on low dose statin Beta Blocker Prescribed? No: history of cocaine use in past For EF 45% or less, Was ACEI/ARB Prescribed? No: EF normal April 2016 ADP Receptor Inhibitor Prescribed? (i.e. Plavix etc.-Includes Medically Managed Patients): No:  For EF <40%, Aldosterone Inhibitor Prescribed? No:  Was EF assessed during THIS hospitalization? No: No LV mentioned at cath. EF was normal in April 2016 by echo.  Was Cardiac Rehab II ordered? (Included Medically managed Patients): Yes   Outstanding Labs/Studies    Duration of Discharge Encounter   Greater than 30 minutes including physician time.  Jolene Provost K PA 01/15/2016, 8:29 AM

## 2016-01-17 LAB — URINE DRUGS OF ABUSE SCREEN W ALC, ROUTINE (REF LAB)
Amphetamines, Urine: NEGATIVE ng/mL
Barbiturate, Ur: NEGATIVE ng/mL
Benzodiazepine Quant, Ur: NEGATIVE ng/mL
Cannabinoid Quant, Ur: NEGATIVE ng/mL
Cocaine (Metab.): NEGATIVE ng/mL
Ethanol U, Quan: NEGATIVE %
Methadone Screen, Urine: NEGATIVE ng/mL
Phencyclidine, Ur: NEGATIVE ng/mL
Propoxyphene, Urine: NEGATIVE ng/mL

## 2016-01-17 LAB — OPIATES CONFIRMATION, URINE
CODEINE: NEGATIVE
MORPHINE: POSITIVE — AB
Morphine GC/MS Conf: 963 ng/mL
OPIATES: POSITIVE — AB

## 2016-01-17 MED FILL — CLOPIDOGREL 75 MG TABLET: 75 | 30 days supply | Qty: 30 | Fill #3

## 2016-01-17 MED FILL — ISOSORBIDE MN ER 30 MG TAB: 30 | 30 days supply | Qty: 90 | Fill #3

## 2016-01-17 MED FILL — ?PANTOPRAZOLE SOD DR 40MG: 40 MG | 30 days supply | Qty: 60 | Fill #3

## 2016-01-17 MED FILL — ?BUPROPION HCL SR 150 MG TA: 150 | 30 days supply | Qty: 90 | Fill #8

## 2016-01-18 MED FILL — ?ESCITALOPRAM 20 MG TABLET: 20 | 30 days supply | Qty: 30 | Fill #0

## 2016-01-24 ENCOUNTER — Ambulatory Visit (INDEPENDENT_AMBULATORY_CARE_PROVIDER_SITE_OTHER): Payer: Self-pay | Admitting: Physician Assistant

## 2016-01-24 ENCOUNTER — Encounter: Payer: Self-pay | Admitting: Physician Assistant

## 2016-01-24 VITALS — BP 108/80 | HR 76 | Ht 61.5 in | Wt 155.8 lb

## 2016-01-24 DIAGNOSIS — Z87898 Personal history of other specified conditions: Secondary | ICD-10-CM

## 2016-01-24 DIAGNOSIS — F1410 Cocaine abuse, uncomplicated: Secondary | ICD-10-CM

## 2016-01-24 DIAGNOSIS — I214 Non-ST elevation (NSTEMI) myocardial infarction: Secondary | ICD-10-CM

## 2016-01-24 DIAGNOSIS — I2510 Atherosclerotic heart disease of native coronary artery without angina pectoris: Secondary | ICD-10-CM

## 2016-01-24 DIAGNOSIS — E785 Hyperlipidemia, unspecified: Secondary | ICD-10-CM

## 2016-01-24 DIAGNOSIS — Z720 Tobacco use: Secondary | ICD-10-CM

## 2016-01-24 DIAGNOSIS — F1491 Cocaine use, unspecified, in remission: Secondary | ICD-10-CM

## 2016-01-24 DIAGNOSIS — Z9861 Coronary angioplasty status: Secondary | ICD-10-CM

## 2016-01-24 NOTE — Progress Notes (Signed)
Cardiology Office Note Date:  01/24/2016  Patient ID:  Christina Morrison, DOB 03/31/1976, MRN [id number] PCP:  Pearson Grippe, MD  Cardiologist:  Excell Seltzer  Chief Complaint: f/u NSTEMI  History of Present Illness: Christina Morrison is a 40 y.o. female with history of CAD (NSTEMI 03/2015 s/p BMS to diagonal, NSTEMI 12/2015 s/o overlapping DES to Cx), cocaine abuse, dyslipidemia, chronic pain, anxiety/depression, tobacco abuse, reported h/o TIA who presents for post-cath follow-up.   She has history of NSTEMI 03/2015 a/w cocaine use - she underwent BMS to diagonal at that time. Repeat cath was done 04/2015 in the setting of missing her long-acting nitrate for a week with stable coronary anatomy and moderate Cx disease. She presented to [facility] 12/2015 with worsening chest pain and ruled in for NSTEMI with troponin of 0.15. UDS was negative for cocaine. Cath showed 90% Cx stenosis treated with overlapping DES. She had mild residual RCA and LAD disease with 75% OM1 to be treated medically. It was noted her LDL was 102 but she reported just recently starting Lipitor. She was not placed on high-intensity statin due to chronic pain issues. It was recommended to recheck lipids/LFTs in several months.  She comes in for follow-up doing well. The hospital discharge summary indicates she was unable to tolerate Ranexa so it was discontinued, but she actually reports she is back on it since she's able to tolerate the  BID dose. She had a brief episode of chest discomfort described as heartburn after drinking caffeine, last night but it never progressed to the point of her typical angina. She never had to take a NTG. Symptoms resolved spontaneously. She had no associated SOB, diaphoresis or nausea. She has otherwise felt fine since discharge. She is trying to work on cutting down smoking. She knows she ultimately needs to quit. She denies further cocaine use.   Past Medical History  Diagnosis Date  . Depression   . Anxiety   .  Bipolar 1 disorder (HCC)   . Scoliosis   . Migraine   . Osteoarthritis   . Coronary artery disease     a. 03/2015 NSTEMI/PCI in setting of cocaine use - BMS to diagonal. b. NSTEMI 12/2015 s/o overlapping DES to Cx, residual mild RCA and LAD disease, 75% OM1.  [name] TIA (transient ischemic attack)   . Polysubstance abuse     a. tobacco/cocaine  . Cocaine use   . Dyslipidemia   . Chronic pain     Past Surgical History  Procedure Laterality Date  . Coronary stent placement  03/21/2015    first diagonal  . Left heart catheterization with coronary angiogram N/A 03/21/2015    Procedure: LEFT HEART CATHETERIZATION WITH CORONARY ANGIOGRAM;  Surgeon: [name], MD;  Location: [facility] CATH LAB;  Service: Cardiovascular;  Laterality: N/A;  . Cardiac catheterization  03/21/2015    Procedure: CORONARY STENT INTERVENTION;  Surgeon: [name], MD;  Location: [facility] CATH LAB;  Service: Cardiovascular;;  diag bms 2.75 x 12 vision  . Left heart catheterization with coronary angiogram N/A 04/11/2015    Procedure: LEFT HEART CATHETERIZATION WITH CORONARY ANGIOGRAM;  Surgeon: Kathleene Hazel, MD;  Location: [facility] CATH LAB;  Service: Cardiovascular;  Laterality: N/A;  . Esophagogastroduodenoscopy (egd) with propofol N/A 06/11/2015    Procedure: ESOPHAGOGASTRODUODENOSCOPY (EGD) WITH PROPOFOL;  Surgeon: Bernette Redbird, MD;  Location: [facility] ENDOSCOPY;  Service: Endoscopy;  Laterality: N/A;  . Cardiac catheterization N/A 01/14/2016    Procedure: Coronary Stent Intervention;  Surgeon: [name], MD;  Location: [facility]  INVASIVE CV LAB;  Service: Cardiovascular;  Laterality: N/A;    Current Outpatient Prescriptions  Medication Sig Dispense Refill  . acetaminophen (TYLENOL) 325 MG tablet Take 2 tablets (650 mg total) by mouth every 4 (four) hours as needed for headache or mild pain.    [name] ALPRAZolam (XANAX) 0.5 MG tablet Take 0.5 mg by mouth 3 (three) times daily.    [name] amLODipine (NORVASC) 5 MG tablet Take 1 tablet (5 mg  total) by mouth daily. 90 tablet 2  . aspirin EC 81 MG EC tablet Take 1 tablet (81 mg total) by mouth daily.    [name] atorvastatin (LIPITOR) 20 MG tablet Take 1 tablet (20 mg total) by mouth daily. 90 tablet 3  . buPROPion (WELLBUTRIN SR) 150 MG 12 hr tablet Take 1-2 tablets (150-300 mg total) by mouth 2 (two) times daily. Takes 300mg  in am and 150mg  in pm 60 tablet 3  . clopidogrel (PLAVIX) 75 MG tablet Take 1 tablet (75 mg total) by mouth daily with breakfast. 90 tablet 2  . escitalopram (LEXAPRO) 20 MG tablet Take 20 mg by mouth daily.  3  . fluticasone (FLONASE) 50 MCG/ACT nasal spray Place 2 sprays into both nostrils daily as needed for allergies or rhinitis.    [name] guaiFENesin (MUCINEX) 600 MG 12 hr tablet Take 600 mg by mouth 2 (two) times daily as needed for cough or to loosen phlegm.    . isosorbide mononitrate (IMDUR) 30 MG 24 hr tablet Take 3 tablets (90 mg total) by mouth daily. 30 tablet 5  . nitroGLYCERIN (NITROSTAT) 0.4 MG SL tablet Place 1 tablet (0.4 mg total) under the tongue every 5 (five) minutes as needed for chest pain. 25 tablet 3  . Oxycodone HCl 10 MG TABS Take 10 mg by mouth 3 (three) times daily as needed (for pain).     . pantoprazole (PROTONIX) 40 MG tablet Take 1 tablet (40 mg total) by mouth 2 (two) times daily before a meal. 180 tablet 2  . ranolazine (RANEXA) 500 MG 12 hr tablet Take 500 mg by mouth 2 (two) times daily.     No current facility-administered medications for this visit.    Allergies:   Ranexa   Social History:  The patient  reports that she has been smoking Cigarettes.  She has been smoking about 1.00 pack per day. She has never used smokeless tobacco. She reports that she does not drink alcohol or use illicit drugs.   Family History:  The patient's family history includes Cancer in her maternal aunt; Depression in her mother; Diabetes in her father; Heart disease in her father and mother; Hyperlipidemia in her father and mother; Hypertension in her  father and mother; Mental retardation in her father and mother; Stroke in her maternal grandmother.  ROS:  Please see the history of present illness.  All other systems are reviewed and otherwise negative.   PHYSICAL EXAM:  VS:  BP 108/80 mmHg  Pulse 76  Ht 5' 1.5" (1.562 m)  Wt 155 lb 12.8 oz (70.67 kg)  BMI 28.96 kg/m2  SpO2 98%  LMP 12/22/2015 BMI: Body mass index is 28.96 kg/(m^2). Well nourished, well developed WF, in no acute distress HEENT: normocephalic, atraumatic Neck: no JVD, carotid bruits or masses Cardiac:  normal S1, S2; RRR; no murmurs, rubs, or gallops Lungs:  clear to auscultation bilaterally, no wheezing, rhonchi or rales Abd: soft, nontender, no hepatomegaly, + BS MS: no deformity or atrophy Ext: no edema, left radial cath site  without hematoma; good pulse, + moderate area of resolving ecchymosis. Skin: warm and dry, no rash Neuro:  moves all extremities spontaneously, no focal abnormalities noted, follows commands Psych: euthymic mood, full affect   EKG:  Done today shows NSR 75bpm no acute ST-T changes  Recent Labs: 04/11/2015: Magnesium 1.7; TSH 1.129 06/09/2015: B Natriuretic Peptide 32.4 06/10/2015: ALT 16 01/15/2016: BUN 7; Creatinine, Ser 0.74; Hemoglobin 11.7*; Platelets 201; Potassium 3.7; Sodium 140  12/20/2015: Cholesterol 160; HDL 39*; LDL Cholesterol 102; Total CHOL/HDL Ratio 4.1; Triglycerides 95; VLDL 19   Estimated Creatinine Clearance: 86 mL/min (by C-G formula based on Cr of 0.74).   Wt Readings from Last 3 Encounters:  01/24/16 155 lb 12.8 oz (70.67 kg)  01/15/16 155 lb 6.8 oz (70.5 kg)  01/04/16 149 lb 12.8 oz (67.949 kg)     Other studies reviewed: Additional studies/records reviewed today include: summarized above  ASSESSMENT AND PLAN:  1. CAD with recent NSTEMI - doing well post-PCI. She had an episode of chest pain last night that was short-lived, nonexertional, and did not require NTG. EKG is benign today. Will have her observe  for any recurrent symptoms for now. She has declined to participate in outpatient cardiac rehab due to a lot of stress at home, but I asked her to let us know if she reconsiders. Will continue current regimen to include ASA, nitrate, amlodipine, statin, Plavix, Ranexa (lower dose -  BID), and Imdur. She needs a refill on the lower dose of Ranexa but does not currently have the pharmacy information for her mail-order. She will call us with this information so that we can refill it. She historically has not been on beta blocker (suspect due to cocaine). Her BP trend has been on the lower side so I will not add this today. This can be considered at f/u appointment if she continues to abstain from cocaine.  2. Dyslipidemia - lipids and liver scheduled for early April 2017. 3. Tobacco abuse - counseled regarding importance of cessation. 4. H/o cocaine abuse - remains abstinent at this time.  Disposition: F/u with Dr. Excell Seltzer as scheduled 03/2016.  Current medicines are reviewed at length with the patient today.  The patient did not have any concerns regarding medicines. Did not charge TOC due to no phone call.  Thomasene Mohair PA-C 01/24/2016 3:57 PM     [facility] [address] [place] [address] [phone] (office)  [phone] (fax)

## 2016-01-24 NOTE — Patient Instructions (Signed)
Medication Instructions:  None  Labwork: Keep lab appointment for 4/10 as previously scheduled  Testing/Procedures: None  Follow-Up: Keep follow up appointment with Dr. Excell Seltzer as previously scheduled.  Any Other Special Instructions Will Be Listed Below (If Applicable).     If you need a refill on your cardiac medications before your next appointment, please call your pharmacy.

## 2016-01-28 ENCOUNTER — Telehealth: Payer: Self-pay | Admitting: *Deleted

## 2016-01-28 NOTE — Telephone Encounter (Signed)
Patient called and stated that a call needs to be placed to [facility] at [phone] to have them send her the ranexa . I am not familiar with this but if it is a type patient assistance, they will probably need a new rx faxed to them. The patient was not sure of this and stated that she only had the number that she provided. She also stated that she has a three month supply of the  that she has not opened and was wanting to know if it needs to be shipped back. Please advise. Thanks, MI

## 2016-01-30 ENCOUNTER — Other Ambulatory Visit: Payer: Self-pay

## 2016-01-30 MED ORDER — RANOLAZINE ER 500 MG PO TB12: 500.0000 mg | ORAL_TABLET | Freq: Two times a day (BID) | ORAL | Status: DC

## 2016-01-30 NOTE — Telephone Encounter (Signed)
New rx printed for Ranexa  q 12 h. Will fax to [employer] ([employer] when signed.

## 2016-01-30 NOTE — Telephone Encounter (Signed)
New rx printed for Ranexa . Will fax to [facility] [phone].

## 2016-02-06 MED FILL — ?AMLODIPINE BESYLATE 5 MG T: 5 | 30 days supply | Qty: 30 | Fill #4

## 2016-02-11 ENCOUNTER — Other Ambulatory Visit: Payer: Self-pay | Admitting: Nurse Practitioner

## 2016-02-11 ENCOUNTER — Telehealth: Payer: Self-pay | Admitting: Cardiovascular Disease

## 2016-02-11 MED FILL — ?ESCITALOPRAM 20 MG TABLET: 20 | 30 days supply | Qty: 30 | Fill #1

## 2016-02-11 MED FILL — BUPROPION SR 150 MG TABLET: 150 | 30 days supply | Qty: 90 | Fill #9

## 2016-02-11 MED FILL — ?PANTOPRAZOLE SOD DR 40MG: 40 MG | 30 days supply | Qty: 60 | Fill #4

## 2016-02-11 MED FILL — ?ATORVASTATIN 20 MG TABLET: 20 | 30 days supply | Qty: 30 | Fill #1

## 2016-02-11 MED FILL — CLOPIDOGREL 75 MG TABLET: 75 | 30 days supply | Qty: 30 | Fill #4

## 2016-02-11 MED FILL — ISOSORBIDE MN ER 30 MG TAB: 30 | 30 days supply | Qty: 90 | Fill #4

## 2016-02-11 NOTE — Telephone Encounter (Signed)
°*  STAT* If patient is at the pharmacy, call can be transferred to refill team.   1. Which medications need to be refilled? (please list name of each medication and dose if known) Nitrostat   2. Which pharmacy/location (including street and city if local pharmacy) is medication to be sent to?[facility]   3. Do they need a 30 day or 90 day supply? 30

## 2016-02-11 NOTE — Telephone Encounter (Signed)
I spoke with the pt and she continues to complain of CP during the night that wakes her up from her sleep around 3 AM.  The pt said she normally takes 1 SL NTG at that time and then the pain will wake her up again in an hour.  The pt has done this for a while and she decided to start taking her Isosorbide at this time and over the past week she has felt better and symptoms have resolved.  I made the pt aware that she is fine to change the timing of when she takes her medication if this helps her symptoms.  The pt also is still waiting on Rx for Ranexa from [facility].  This Rx was faxed last week. The pt will contact the office with any other questions or concerns.

## 2016-02-11 NOTE — Telephone Encounter (Signed)
Christina Morrison is calling because she has been having Chest Pains at night and has statred taking one isosobride at night and it has been haelping her at night . Wants to know if Dr.Cooper Wants to up the medication .  Thanks

## 2016-02-12 MED FILL — NITROSTAT 0.4 MG TABLET SL: 0.4 | 25 days supply | Qty: 25 | Fill #0 | Status: TO

## 2016-02-12 NOTE — Telephone Encounter (Signed)
I spoke with the pt and she called to request a new Rx for SL NTG. Rx was sent in earlier today from our office.

## 2016-02-12 NOTE — Telephone Encounter (Signed)
Follow up    Pt is returning call for RN  Pt is wanting a new prescription for Nitro

## 2016-02-13 ENCOUNTER — Encounter: Payer: Self-pay | Admitting: *Deleted

## 2016-02-13 NOTE — Telephone Encounter (Signed)
Agree; thx 

## 2016-02-27 ENCOUNTER — Telehealth: Payer: Self-pay | Admitting: Cardiovascular Disease

## 2016-02-27 NOTE — Telephone Encounter (Signed)
New message      Pt c/o of Chest Pain: STAT if CP now or developed within 24 hours  1. Are you having CP right now? no  2. Are you experiencing any other symptoms (ex. SOB, nausea, vomiting, sweating)? no  3. How long have you been experiencing CP?  For 1 week 4. Is your CP continuous or coming and going?  Comes and goes-------mainly when she lies down 5. Have you taken Nitroglycerin? ?yes Pt states her husband is in ICU

## 2016-02-29 ENCOUNTER — Other Ambulatory Visit: Payer: Self-pay

## 2016-02-29 MED ORDER — NITROGLYCERIN 0.4 MG SL SUBL: 0.4000 mg | SUBLINGUAL_TABLET | SUBLINGUAL | Status: DC | PRN

## 2016-02-29 MED FILL — NITROSTAT 0.4 MG TABLET SL: 0.4 | 25 days supply | Qty: 25 | Fill #0

## 2016-03-06 ENCOUNTER — Encounter: Payer: Self-pay | Admitting: Cardiology

## 2016-03-06 ENCOUNTER — Ambulatory Visit (INDEPENDENT_AMBULATORY_CARE_PROVIDER_SITE_OTHER): Payer: Self-pay | Admitting: Cardiology

## 2016-03-06 VITALS — BP 90/60 | HR 80 | Ht 61.5 in | Wt 154.0 lb

## 2016-03-06 DIAGNOSIS — I200 Unstable angina: Secondary | ICD-10-CM

## 2016-03-06 DIAGNOSIS — Z9861 Coronary angioplasty status: Secondary | ICD-10-CM

## 2016-03-06 DIAGNOSIS — I2510 Atherosclerotic heart disease of native coronary artery without angina pectoris: Secondary | ICD-10-CM

## 2016-03-06 DIAGNOSIS — E785 Hyperlipidemia, unspecified: Secondary | ICD-10-CM

## 2016-03-06 DIAGNOSIS — Z720 Tobacco use: Secondary | ICD-10-CM

## 2016-03-06 DIAGNOSIS — I259 Chronic ischemic heart disease, unspecified: Secondary | ICD-10-CM

## 2016-03-06 MED ORDER — ATORVASTATIN CALCIUM 20 MG PO TABS: 40.0000 mg | ORAL_TABLET | Freq: Every day | ORAL | Status: DC

## 2016-03-06 NOTE — Patient Instructions (Addendum)
Medication Instructions:    START LIPITOR 40 MG ONCE A DAY   If you need a refill on your cardiac medications before your next appointment, please call your pharmacy.  Labwork: NONE ORDER TODAY   Testing/Procedures: Your physician has requested that you have a lexiscan myoview.  TOMORROW OR Tuesday..For further information please visit [email or web address]. Please follow instruction sheet, as given.   Follow-Up:  WITH  [facility]OPER   AS SCHEDULED   Any Other Special Instructions Will Be Listed Below (If Applicable).

## 2016-03-06 NOTE — Progress Notes (Signed)
Cardiology Office Note   Date:  03/06/2016   ID:  Sundeep Cary, DOB 12-22-1975, MRN [id number]  PCP:  Pearson Grippe, MD  Cardiologist:  Dr. Excell Seltzer    Chief Complaint  Patient presents with  . Chest Pain    increased at night      History of Present Illness: Christina Morrison is a 40 y.o. female who presents for increasing chest pain at night waking from sleep.   She has a history of CAD (NSTEMI 03/2015 s/p BMS to diagonal, NSTEMI 12/2015 s/o overlapping DES to Cx), cocaine abuse she states once with first ACS/NSTEMI-none since , dyslipidemia, chronic pain, anxiety/depression, tobacco abuse, bipolar, reported h/o TIA who presents for post-cath follow-up.   She has history of NSTEMI 03/2015 a/w cocaine use - she underwent BMS to diagonal at that time. Repeat cath was done 04/2015 in the setting of missing her long-acting nitrate for a week with stable coronary anatomy and moderate Cx disease. She presented to [facility] 12/2015 with worsening chest pain and ruled in for NSTEMI with troponin of 0.15. UDS was negative for cocaine. Cath showed 90% Cx stenosis treated with overlapping DES. She had mild residual RCA and LAD disease with 75% OM1 to be treated medically. It was noted her LDL was 102 but she reported just recently starting Lipitor. She was not placed on high-intensity statin due to chronic pain issues. It was recommended to recheck lipids/LFTs in several months but with con't chest pain will increase Lipitor to 40 mg.  Her pain has been freq since last stents and occurs with rest.  Not with exertion. So severe at night she almost called EMS,  She has gone through bottle of SL NTG which did stop the pain.   She changed her 90 mg of AM imdur to 60 mg in AM and 30 at HS which has stopped the night pain.  But in evening late afternoon the pressure comes up but not too severe.  She is on Protonix BID.  She does have coronary spasm as well.  She is down to 2-3 cigarettes per day but has not been able to stop.   Discussed importance of nicotine and coronary spasm.  She is on amlodipine for spasm as well.   She was on Ranexa 1000 BID but would become very dizzy so she was decreased to 500 BID.  Her B systolic is 90 no rom to increase meds.  With pain she is nauseated and SOB.    Past Medical History  Diagnosis Date  . Depression   . Anxiety   . Bipolar 1 disorder (HCC)   . Scoliosis   . Migraine   . Osteoarthritis   . Coronary artery disease     a. 03/2015 NSTEMI/PCI in setting of cocaine use - BMS to diagonal. b. NSTEMI 12/2015 s/o overlapping DES to Cx, residual mild RCA and LAD disease, 75% OM1.  [name] TIA (transient ischemic attack)   . Polysubstance abuse     a. tobacco/cocaine  . Cocaine use   . Dyslipidemia   . Chronic pain     Past Surgical History  Procedure Laterality Date  . Coronary stent placement  03/21/2015    first diagonal  . Left heart catheterization with coronary angiogram N/A 03/21/2015    Procedure: LEFT HEART CATHETERIZATION WITH CORONARY ANGIOGRAM;  Surgeon: [name], MD;  Location: [facility] CATH LAB;  Service: Cardiovascular;  Laterality: N/A;  . Cardiac catheterization  03/21/2015    Procedure: CORONARY STENT INTERVENTION;  Surgeon: [name], MD;  Location: [facility] CATH LAB;  Service: Cardiovascular;;  diag bms 2.75 x 12 vision  . Left heart catheterization with coronary angiogram N/A 04/11/2015    Procedure: LEFT HEART CATHETERIZATION WITH CORONARY ANGIOGRAM;  Surgeon: Kathleene Hazel, MD;  Location: [facility] CATH LAB;  Service: Cardiovascular;  Laterality: N/A;  . Esophagogastroduodenoscopy (egd) with propofol N/A 06/11/2015    Procedure: ESOPHAGOGASTRODUODENOSCOPY (EGD) WITH PROPOFOL;  Surgeon: Bernette Redbird, MD;  Location: [facility] ENDOSCOPY;  Service: Endoscopy;  Laterality: N/A;  . Cardiac catheterization N/A 01/14/2016    Procedure: Coronary Stent Intervention;  Surgeon: [name], MD;  Location: [facility] INVASIVE CV LAB;  Service: Cardiovascular;  Laterality: N/A;      Current Outpatient Prescriptions  Medication Sig Dispense Refill  . acetaminophen (TYLENOL) 325 MG tablet Take 2 tablets (650 mg total) by mouth every 4 (four) hours as needed for headache or mild pain.    [name] ALPRAZolam (XANAX) 0.5 MG tablet Take 0.5 mg by mouth 3 (three) times daily.    [name] amLODipine (NORVASC) 5 MG tablet Take 1 tablet (5 mg total) by mouth daily. 90 tablet 2  . aspirin EC 81 MG EC tablet Take 1 tablet (81 mg total) by mouth daily.    [name] atorvastatin (LIPITOR) 20 MG tablet Take 2 tablets (40 mg total) by mouth daily. 90 tablet 3  . buPROPion (WELLBUTRIN SR) 150 MG 12 hr tablet Take 1-2 tablets (150-300 mg total) by mouth 2 (two) times daily. Takes  in am and  in pm 60 tablet 3  . clopidogrel (PLAVIX) 75 MG tablet Take 1 tablet (75 mg total) by mouth daily with breakfast. 90 tablet 2  . escitalopram (LEXAPRO) 20 MG tablet Take 20 mg by mouth daily.  3  . fluticasone (FLONASE) 50 MCG/ACT nasal spray Place 2 sprays into both nostrils daily as needed for allergies or rhinitis.    [name] guaiFENesin (MUCINEX) 600 MG 12 hr tablet Take 600 mg by mouth 2 (two) times daily as needed for cough or to loosen phlegm.    . isosorbide mononitrate (IMDUR) 30 MG 24 hr tablet Take 3 tablets (90 mg total) by mouth daily. 30 tablet 5  . nitroGLYCERIN (NITROSTAT) 0.4 MG SL tablet Place 1 tablet (0.4 mg total) under the tongue every 5 (five) minutes as needed for chest pain. 25 tablet 0  . Oxycodone HCl 10 MG TABS Take 10 mg by mouth 3 (three) times daily as needed (for pain).     . pantoprazole (PROTONIX) 40 MG tablet Take 1 tablet (40 mg total) by mouth 2 (two) times daily before a meal. 180 tablet 2  . ranolazine (RANEXA) 500 MG 12 hr tablet Take 1 tablet (500 mg total) by mouth 2 (two) times daily. 180 tablet 3   No current facility-administered medications for this visit.    Allergies:   Ranexa    Social History:  The patient  reports that she has been smoking Cigarettes.  She has  been smoking about 0.10 packs per day. She has never used smokeless tobacco. She reports that she does not drink alcohol or use illicit drugs.   Family History:  The patient's family history includes Cancer in her maternal aunt; Depression in her mother; Diabetes in her father; Heart disease in her father and mother; Hyperlipidemia in her father and mother; Hypertension in her father and mother; Mental retardation in her father and mother; Stroke in her maternal grandmother.    ROS:  General:no colds or fevers,  no weight changes Skin:no rashes or ulcers HEENT:no blurred vision, no congestion CV:see HPI PUL:see HPI GI:no diarrhea constipation or melena, no indigestion GU:no hematuria, no dysuria MS:no joint pain, no claudication Neuro:no syncope, + lightheadedness at times Endo:no diabetes, no thyroid disease  Wt Readings from Last 3 Encounters:  03/06/16 154 lb (69.854 kg)  01/24/16 155 lb 12.8 oz (70.67 kg)  01/15/16 155 lb 6.8 oz (70.5 kg)     PHYSICAL EXAM: VS:  BP 90/60 mmHg  Pulse 80  Ht 5' 1.5" (1.562 m)  Wt 154 lb (69.854 kg)  BMI 28.63 kg/m2  LMP 02/18/2016 , BMI Body mass index is 28.63 kg/(m^2). General:Pleasant affect, NAD Skin:Warm and dry, brisk capillary refill HEENT:normocephalic, sclera clear, mucus membranes moist Neck:supple, no JVD, no bruits  Heart:S1S2 RRR without murmur, gallup, rub or click Lungs:clear without rales, rhonchi, or wheezes ZHY:QMVHAbd:soft, non tender, + BS, do not palpate liver spleen or masses Ext:no lower ext edema, 2+ pedal pulses, 2+ radial pulses Neuro:alert and oriented X 3, MAE, follows commands, + facial symmetry    EKG:  EKG is ordered today. The ekg ordered today demonstrates SR normal EKG no changes.   Recent Labs: 04/11/2015: Magnesium 1.7; TSH 1.129 06/09/2015: B Natriuretic Peptide 32.4 06/10/2015: ALT 16 01/15/2016: BUN 7; Creatinine, Ser 0.74; Hemoglobin 11.7*; Platelets 201; Potassium 3.7; Sodium 140    Lipid Panel     Component Value Date/Time   CHOL 160 12/20/2015 1218   TRIG 95 12/20/2015 1218   HDL 39* 12/20/2015 1218   CHOLHDL 4.1 12/20/2015 1218   VLDL 19 12/20/2015 1218   LDLCALC 102 12/20/2015 1218       Other studies Reviewed: Additional studies/ records that were reviewed today include: . Cardiac cath 01/14/16:  Mid RCA lesion, 30% stenosed.  Mid LAD lesion, 25% stenosed.  1st Diag lesion, 20% stenosed. The lesion was previously treated with a stent (unknown type) six to twelve months ago.  Ost 1st Mrg to 1st Mrg lesion, 75% stenosed.  Ost Cx to Prox Cx lesion, 70% stenosed. Post intervention, there is a 0% residual stenosis.  Prox Cx to Mid Cx lesion, 90% stenosed. Post intervention, there is a 0% residual stenosis.  1. Severe diffuse left circumflex stenosis with successful PCI using overlapping DES extending from the ostium through the mid-vessel 2. Mild nonobstructive LAD and RCA stenosis 3. Continued patency of the stented segment in the first diagonal  Aggressive medical therapy, tobacco cessation, lifestyle modification.  If repeat cath needed, recommend femoral approach. Pt has had significant arterial spasm last 2 procedures.        ASSESSMENT AND PLAN:   CAD with recent NSTEMI -  She historically has not been on beta blocker (suspect due to cocaine).   She denies any further cocaine.  Now with increasing chest pain relief with SL NTG, though with adjustment of meds the pain has improved.  But she still has mild episodes.  Cannot adjust meds with her low BP. She is under a lot of stress.  If severe pain returns she should go to ER.   Discussed with Dr. Anne FuSkains DOD today and Dr. Katrinka BlazingSmith- both feel lexiscan Celine Ahrmyoview would be best test.  This may be spasm so I discussed importance of stopping the tobacco.  If nuc is positive would need to proceed with cath.        Dyslipidemia - lipids and liver scheduled for early April 2017. Increased lipitor to 40 for now as she  has aggressive CAD  Tobacco abuse - counseled regarding importance of cessation.  H/o cocaine abuse - remains abstinent at this time.       Current medicines are reviewed with the patient today.  The patient Has no concerns regarding medicines.  The following changes have been made:  See above Labs/ tests ordered today include:see above  Disposition:   FU:  see above  Nyoka Lint, NP  03/06/2016 6:17 PM    [facility] [address] [place], [place], [place]  [address]/ [address] [place], [place] Phone: [phone]; Fax: [phone]  [phone]

## 2016-03-07 ENCOUNTER — Telehealth (HOSPITAL_COMMUNITY): Payer: Self-pay | Admitting: *Deleted

## 2016-03-07 NOTE — Telephone Encounter (Signed)
Patient given detailed instructions per Myocardial Perfusion Study Information Sheet for the test on 03/12/16 at 10:15. Patient notified to arrive 15 minutes early and that it is imperative to arrive on time for appointment to keep from having the test rescheduled.  If you need to cancel or reschedule your appointment, please call the office within 24 hours of your appointment. Failure to do so may result in a cancellation of your appointment, and a $50 no show fee. Patient verbalized understanding.Daneil DolinSharon S Brooks

## 2016-03-12 ENCOUNTER — Ambulatory Visit (HOSPITAL_COMMUNITY): Payer: Medicaid Other | Attending: Cardiovascular Disease

## 2016-03-12 DIAGNOSIS — I2510 Atherosclerotic heart disease of native coronary artery without angina pectoris: Secondary | ICD-10-CM | POA: Insufficient documentation

## 2016-03-12 DIAGNOSIS — I200 Unstable angina: Secondary | ICD-10-CM | POA: Insufficient documentation

## 2016-03-12 LAB — MYOCARDIAL PERFUSION IMAGING
CHL CUP NUCLEAR SRS: 0
CHL CUP NUCLEAR SSS: 2
LV dias vol: 98 mL (ref 46–106)
LV sys vol: 38 mL
NUC STRESS TID: 1.16
Peak HR: 94 {beats}/min
RATE: 0.25
Rest HR: 67 {beats}/min
SDS: 2

## 2016-03-12 MED ORDER — REGADENOSON 0.4 MG/5ML IV SOLN
0.4000 mg | Freq: Once | INTRAVENOUS | Status: AC
  Administered 2016-03-12: 0.4 mg via INTRAVENOUS

## 2016-03-12 MED ORDER — TECHNETIUM TC 99M SESTAMIBI GENERIC - CARDIOLITE
31.9000 | Freq: Once | INTRAVENOUS | Status: AC | PRN
  Administered 2016-03-12: 31.9 via INTRAVENOUS

## 2016-03-12 MED ORDER — TECHNETIUM TC 99M SESTAMIBI GENERIC - CARDIOLITE
11.0000 | Freq: Once | INTRAVENOUS | Status: AC | PRN
  Administered 2016-03-12: 11 via INTRAVENOUS

## 2016-03-12 MED FILL — ?ESCITALOPRAM 20 MG TABLET: 20 | 30 days supply | Qty: 30 | Fill #2

## 2016-03-12 MED FILL — CLOPIDOGREL 75 MG TABLET: 75 | 30 days supply | Qty: 30 | Fill #5

## 2016-03-12 MED FILL — ISOSORBIDE MN ER 30 MG TAB: 30 | 30 days supply | Qty: 90 | Fill #5

## 2016-03-12 MED FILL — ?PANTOPRAZOLE SOD DR 40MG: 40 MG | 30 days supply | Qty: 60 | Fill #5

## 2016-03-12 MED FILL — BUPROPION SR 150 MG TABLET: 150 | 30 days supply | Qty: 90 | Fill #10

## 2016-03-13 ENCOUNTER — Other Ambulatory Visit: Payer: Self-pay | Admitting: *Deleted

## 2016-03-13 ENCOUNTER — Telehealth: Payer: Self-pay | Admitting: *Deleted

## 2016-03-13 MED ORDER — ATORVASTATIN CALCIUM 40 MG PO TABS: 40.0000 mg | ORAL_TABLET | Freq: Every day | ORAL | Status: DC

## 2016-03-13 NOTE — Telephone Encounter (Signed)
Patient called to have RX rewritten. [place] changed Lipitor from 20mg  to 40mg  daily. Sent in RX for 40mg 

## 2016-03-17 MED FILL — ?AMLODIPINE BESYLATE 5 MG T: 5 | 30 days supply | Qty: 30 | Fill #5

## 2016-03-19 MED FILL — ATORVASTATIN 40 MG TABLET: 40 | 30 days supply | Qty: 30 | Fill #0

## 2016-03-24 ENCOUNTER — Other Ambulatory Visit (INDEPENDENT_AMBULATORY_CARE_PROVIDER_SITE_OTHER): Payer: Self-pay | Admitting: *Deleted

## 2016-03-24 DIAGNOSIS — E785 Hyperlipidemia, unspecified: Secondary | ICD-10-CM

## 2016-03-24 LAB — HEPATIC FUNCTION PANEL
ALK PHOS: 81 U/L (ref 33–115)
ALT: 18 U/L (ref 6–29)
AST: 17 U/L (ref 10–30)
Albumin: 4.4 g/dL (ref 3.6–5.1)
BILIRUBIN INDIRECT: 0.4 mg/dL (ref 0.2–1.2)
BILIRUBIN TOTAL: 0.5 mg/dL (ref 0.2–1.2)
Bilirubin, Direct: 0.1 mg/dL (ref <=0.2)
Total Protein: 6.8 g/dL (ref 6.1–8.1)

## 2016-03-24 LAB — LIPID PANEL
Cholesterol: 185 mg/dL (ref 125–200)
HDL: 35 mg/dL — ABNORMAL LOW (ref >=46)
LDL CALC: 122 mg/dL (ref <130)
TRIGLYCERIDES: 141 mg/dL (ref <150)
Total CHOL/HDL Ratio: 5.3 Ratio — ABNORMAL HIGH (ref <=5.0)
VLDL: 28 mg/dL (ref <30)

## 2016-04-05 IMAGING — NM NM MISC PROCEDURE
3 series · 18 of 18 positions shown · non-contrast
Comparison: none

[Series 1: stress-gsp_(id)_sa · 6.4mm · 6.40mm/px · 6 of 512 frames shown]
[frame 43/512]
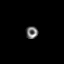
[frame 128/512]
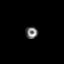
[frame 214/512]
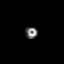
[frame 299/512]
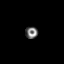
[frame 384/512]
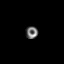
[frame 470/512]
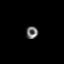

[Series 1: stress-sum-em_(id)_sa · 6.4mm · 6.40mm/px · 6 of 64 frames shown]
[frame 6/64]
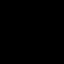
[frame 16/64]
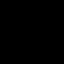
[frame 27/64]
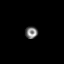
[frame 38/64]
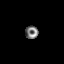
[frame 48/64]
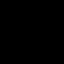
[frame 59/64]
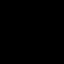

[Series 1: rest_(id)_sa · 6.4mm · 6.40mm/px · 6 of 64 frames shown]
[frame 6/64]
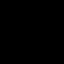
[frame 16/64]
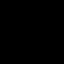
[frame 27/64]
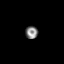
[frame 38/64]
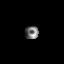
[frame 48/64]
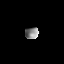
[frame 59/64]
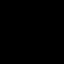

[18 of 18 positions shown; findings below may reference images not displayed]

Canned report from images found in remote index.

Refer to host system for actual result text.

## 2016-04-11 ENCOUNTER — Ambulatory Visit: Payer: Self-pay | Admitting: Cardiovascular Disease

## 2016-04-11 ENCOUNTER — Other Ambulatory Visit: Payer: Self-pay

## 2016-04-11 DIAGNOSIS — Z1231 Encounter for screening mammogram for malignant neoplasm of breast: Secondary | ICD-10-CM

## 2016-04-15 ENCOUNTER — Encounter: Payer: Self-pay | Admitting: Cardiovascular Disease

## 2016-04-18 MED FILL — ATORVASTATIN 40 MG TABLET: 40 | 30 days supply | Qty: 30 | Fill #1

## 2016-04-18 MED FILL — ISOSORBIDE MN ER 30 MG TAB: 30 | 30 days supply | Qty: 90 | Fill #6 | Status: TO

## 2016-04-18 MED FILL — CLOPIDOGREL 75 MG TABLET: 75 | 30 days supply | Qty: 30 | Fill #6

## 2016-04-18 MED FILL — AMLODIPINE BESYLATE 5 MG TA: 5 | 30 days supply | Qty: 30 | Fill #6

## 2016-04-18 MED FILL — PANTOPRAZOLE SOD DR 40 MG T: 40 | 30 days supply | Qty: 60 | Fill #6

## 2016-04-18 MED FILL — BUPROPION SR 150 MG TABLET: 150 | 30 days supply | Qty: 90 | Fill #11

## 2016-04-29 ENCOUNTER — Ambulatory Visit
Admission: RE | Admit: 2016-04-29 | Discharge: 2016-04-29 | Disposition: A | Discharge status: Home / Self Care | Payer: Medicaid Other | Source: Ambulatory Visit

## 2016-04-29 DIAGNOSIS — Z1231 Encounter for screening mammogram for malignant neoplasm of breast: Secondary | ICD-10-CM

## 2016-05-06 ENCOUNTER — Other Ambulatory Visit: Payer: Self-pay | Admitting: Internal Medicine

## 2016-05-06 DIAGNOSIS — N6320 Unspecified lump in the left breast, unspecified quadrant: Secondary | ICD-10-CM

## 2016-05-06 DIAGNOSIS — N644 Mastodynia: Secondary | ICD-10-CM

## 2016-05-07 ENCOUNTER — Other Ambulatory Visit: Payer: Self-pay | Admitting: Internal Medicine

## 2016-05-07 DIAGNOSIS — N644 Mastodynia: Secondary | ICD-10-CM

## 2016-05-07 DIAGNOSIS — N6320 Unspecified lump in the left breast, unspecified quadrant: Secondary | ICD-10-CM

## 2016-05-14 MED FILL — ESCITALOPRAM 20 MG TABLET: 20 | 30 days supply | Qty: 30 | Fill #3

## 2016-05-14 MED FILL — ATORVASTATIN 40 MG TABLET: 40 | 30 days supply | Qty: 30 | Fill #2 | Status: TO

## 2016-05-20 ENCOUNTER — Ambulatory Visit (INDEPENDENT_AMBULATORY_CARE_PROVIDER_SITE_OTHER): Payer: Medicaid Other | Admitting: Cardiovascular Disease

## 2016-05-20 ENCOUNTER — Encounter: Payer: Self-pay | Admitting: Cardiovascular Disease

## 2016-05-20 ENCOUNTER — Other Ambulatory Visit: Payer: Self-pay | Admitting: Internal Medicine

## 2016-05-20 VITALS — BP 120/70 | HR 72 | Ht 61.0 in | Wt 164.6 lb

## 2016-05-20 DIAGNOSIS — N6310 Unspecified lump in the right breast, unspecified quadrant: Secondary | ICD-10-CM

## 2016-05-20 DIAGNOSIS — I25119 Atherosclerotic heart disease of native coronary artery with unspecified angina pectoris: Secondary | ICD-10-CM | POA: Diagnosis not present

## 2016-05-20 DIAGNOSIS — N644 Mastodynia: Secondary | ICD-10-CM

## 2016-05-20 DIAGNOSIS — N6320 Unspecified lump in the left breast, unspecified quadrant: Principal | ICD-10-CM

## 2016-05-20 MED ORDER — NITROGLYCERIN 0.4 MG SL SUBL: 0.4000 mg | SUBLINGUAL_TABLET | SUBLINGUAL | Status: DC | PRN

## 2016-05-20 NOTE — Progress Notes (Signed)
Cardiology Office Note Date:  05/20/2016   ID:  Christina Morrison, DOB [birth year], MRN [id number]  PCP:  Pearson Grippe, MD  Cardiologist:  [name], MD    Chief Complaint  Patient presents with  . unstable angina  . Coronary Artery Disease     History of Present Illness: Christina Morrison is a 40 y.o. female who presents for Follow-up of coronary artery disease. The patient has extensive premature CAD. She initially presented in April 2016 with non-ST elevation infarction and was treated with bare metal stenting to a diagonal branch of the LAD. This occurred in the setting of cocaine use. She has developed chronic chest pain and is undergone repeated cardiac catheterization studies. She presented again in January 2017 with non-ST elevation infarction and was treated with overlapping drug-eluting stents the left circumflex for progressive obstructive disease and that vessel. Other problems include chronic pain, dyslipidemia, depression, tobacco abuse. She no longer is using illicit drugs.  She's gained about 30 pounds. She's cut back on cigarettes. Overall she feels she is doing better with less chest discomfort. She has not required any nitroglycerin. When she doesn't take her cardiac medicines, she does experience some pressure in her chest.  Otherwise she is doing well. Her fianc is pretty sick with severe COPD and alpha-1 antitrypsin deficiency. She has spent a lot of time in the hospital with him.   Past Medical History  Diagnosis Date  . Depression   . Anxiety   . Bipolar 1 disorder (HCC)   . Scoliosis   . Migraine   . Osteoarthritis   . Coronary artery disease     a. 03/2015 NSTEMI/PCI in setting of cocaine use - BMS to diagonal. b. NSTEMI 12/2015 s/o overlapping DES to Cx, residual mild RCA and LAD disease, 75% OM1.  [name] TIA (transient ischemic attack)   . Polysubstance abuse     a. tobacco/cocaine  . Cocaine use   . Dyslipidemia   . Chronic pain     Past Surgical History    Procedure Laterality Date  . Coronary stent placement  03/21/2015    first diagonal  . Left heart catheterization with coronary angiogram N/A 03/21/2015    Procedure: LEFT HEART CATHETERIZATION WITH CORONARY ANGIOGRAM;  Surgeon: [name], MD;  Location: [facility] CATH LAB;  Service: Cardiovascular;  Laterality: N/A;  . Cardiac catheterization  03/21/2015    Procedure: CORONARY STENT INTERVENTION;  Surgeon: [name], MD;  Location: [facility] CATH LAB;  Service: Cardiovascular;;  diag bms 2.75 x 12 vision  . Left heart catheterization with coronary angiogram N/A 04/11/2015    Procedure: LEFT HEART CATHETERIZATION WITH CORONARY ANGIOGRAM;  Surgeon: Kathleene Hazel, MD;  Location: [facility] CATH LAB;  Service: Cardiovascular;  Laterality: N/A;  . Esophagogastroduodenoscopy (egd) with propofol N/A 06/11/2015    Procedure: ESOPHAGOGASTRODUODENOSCOPY (EGD) WITH PROPOFOL;  Surgeon: Bernette Redbird, MD;  Location: [facility] ENDOSCOPY;  Service: Endoscopy;  Laterality: N/A;  . Cardiac catheterization N/A 01/14/2016    Procedure: Coronary Stent Intervention;  Surgeon: [name], MD;  Location: [facility] INVASIVE CV LAB;  Service: Cardiovascular;  Laterality: N/A;    Current Outpatient Prescriptions  Medication Sig Dispense Refill  . acetaminophen (TYLENOL) 325 MG tablet Take 2 tablets (650 mg total) by mouth every 4 (four) hours as needed for headache or mild pain.    [name] ALPRAZolam (XANAX) 0.5 MG tablet Take 0.5 mg by mouth 3 (three) times daily.    [name] amLODipine (NORVASC) 5 MG tablet Take 1 tablet (5 mg  total) by mouth daily. 90 tablet 2  . aspirin EC 81 MG EC tablet Take 1 tablet (81 mg total) by mouth daily.    [name]. atorvastatin (LIPITOR) 40 MG tablet Take 1 tablet (40 mg total) by mouth daily. 30 tablet 11  . buPROPion (WELLBUTRIN SR) 150 MG 12 hr tablet Take 1-2 tablets (150-300 mg total) by mouth 2 (two) times daily. Takes 300mg  in am and 150mg  in pm 60 tablet 3  . clopidogrel (PLAVIX) 75 MG tablet Take 1 tablet (75 mg  total) by mouth daily with breakfast. 90 tablet 2  . Diclofenac Sodium 3 % GEL Apply 1 application topically 4 (four) times daily as needed. Pain  5  . Doxepin HCl 5 % CREA Apply 1 application topically 3 (three) times daily as needed. Pain  5  . escitalopram (LEXAPRO) 20 MG tablet Take 20 mg by mouth daily.  3  . fluticasone (FLONASE) 50 MCG/ACT nasal spray Place 2 sprays into both nostrils daily as needed for allergies or rhinitis.    [name]. guaiFENesin (MUCINEX) 600 MG 12 hr tablet Take 600 mg by mouth 2 (two) times daily as needed for cough or to loosen phlegm.    . isosorbide mononitrate (IMDUR) 30 MG 24 hr tablet Take 3 tablets (90 mg total) by mouth daily. 30 tablet 5  . lidocaine (XYLOCAINE) 5 % ointment Apply 1 application topically 4 (four) times daily as needed. pain  5  . nitroGLYCERIN (NITROSTAT) 0.4 MG SL tablet Place 1 tablet (0.4 mg total) under the tongue every 5 (five) minutes as needed for chest pain. 25 tablet 1  . Oxycodone HCl 10 MG TABS Take 10 mg by mouth 3 (three) times daily as needed (for pain).     . pantoprazole (PROTONIX) 40 MG tablet Take 1 tablet (40 mg total) by mouth 2 (two) times daily before a meal. 180 tablet 2  . ranolazine (RANEXA) 500 MG 12 hr tablet Take 1 tablet (500 mg total) by mouth 2 (two) times daily. 180 tablet 3   No current facility-administered medications for this visit.    Allergies:   Ranexa   Social History:  The patient  reports that she has been smoking Cigarettes.  She has been smoking about 0.10 packs per day. She has never used smokeless tobacco. She reports that she does not drink alcohol or use illicit drugs.   Family History:  The patient's family history includes Cancer in her maternal aunt; Depression in her mother; Diabetes in her father; Heart disease in her father and mother; Hyperlipidemia in her father and mother; Hypertension in her father and mother; Mental retardation in her father and mother; Stroke in her maternal grandmother.      ROS:  Please see the history of present illness.  Otherwise, review of systems is positive for Weight gain, exertional dyspnea, depression, back pain, muscle pain, dizziness, easy bruising, excessive sweating, fatigue, snoring, constipation, anxiety, difficulty urinating, headaches.  All other systems are reviewed and negative.    PHYSICAL EXAM: VS:  BP 120/70 mmHg  Pulse 72  Ht 5\' 1"  (1.549 m)  Wt 164 lb 9.6 oz (74.662 kg)  BMI 31.12 kg/m2 , BMI Body mass index is 31.12 kg/(m^2). GEN: Well nourished, well developed, in no acute distress HEENT: normal Neck: no JVD, no masses. No carotid bruits Cardiac: RRR without murmur or gallop                Respiratory:  clear to auscultation bilaterally, normal work  of breathing GI: soft, nontender, nondistended, + BS MS: no deformity or atrophy Ext: no pretibial edema, pedal pulses 2+= bilaterally Skin: warm and dry, no rash Neuro:  Strength and sensation are intact Psych: euthymic mood, full affect  EKG:  EKG is not ordered today.  Recent Labs: 06/09/2015: B Natriuretic Peptide 32.4 01/15/2016: BUN 7; Creatinine, Ser 0.74; Hemoglobin 11.7*; Platelets 201; Potassium 3.7; Sodium 140 03/24/2016: ALT 18   Lipid Panel     Component Value Date/Time   CHOL 185 03/24/2016 1113   TRIG 141 03/24/2016 1113   HDL 35* 03/24/2016 1113   CHOLHDL 5.3* 03/24/2016 1113   VLDL 28 03/24/2016 1113   LDLCALC 122 03/24/2016 1113      Wt Readings from Last 3 Encounters:  05/20/16 164 lb 9.6 oz (74.662 kg)  03/12/16 154 lb (69.854 kg)  03/06/16 154 lb (69.854 kg)     Cardiac Studies Reviewed: Cardiac Cath: Conclusion     Mid RCA lesion, 30% stenosed.  Mid LAD lesion, 25% stenosed.  1st Diag lesion, 20% stenosed. The lesion was previously treated with a stent (unknown type) six to twelve months ago.  Ost 1st Mrg to 1st Mrg lesion, 75% stenosed.  Ost Cx to Prox Cx lesion, 70% stenosed. Post intervention, there is a 0% residual  stenosis.  Prox Cx to Mid Cx lesion, 90% stenosed. Post intervention, there is a 0% residual stenosis.  1. Severe diffuse left circumflex stenosis with successful PCI using overlapping DES extending from the ostium through the mid-vessel 2. Mild nonobstructive LAD and RCA stenosis 3. Continued patency of the stented segment in the first diagonal  Aggressive medical therapy, tobacco cessation, lifestyle modification.  If repeat cath needed, recommend femoral approach. Pt has had significant arterial spasm last 2 procedures.   ASSESSMENT AND PLAN: 1.  CAD, native angina, with chronic CCS class II angina: The patient appears stable on her current medical program. We reviewed the importance of complete tobacco cessation, weight loss, diet, and exercise. She will follow-up in 6 months. She should continue on dual antiplatelet therapy with aspirin and Plavix. She is on multiple anti-anginal medications and these will be continued without change.   2. Essential hypertension: She will continue on her current medical program.  3. Hyperlipidemia: Treated with atorvastatin 40 mg daily.  4. Tobacco abuse: complete cessation advised. Counseling done.   Current medicines are reviewed with the patient today.  The patient does not have concerns regarding medicines.  Labs/ tests ordered today include:  No orders of the defined types were placed in this encounter.    Disposition:   FU 6 months  Signed, [name], MD  05/20/2016 2:23 PM    [facility] [address] [place], [place], [place]  [address] Phone: [phone]; Fax: [phone]

## 2016-05-20 NOTE — Patient Instructions (Signed)

## 2016-05-21 ENCOUNTER — Ambulatory Visit
Admission: RE | Admit: 2016-05-21 | Discharge: 2016-05-21 | Disposition: A | Discharge status: Home / Self Care | Payer: Medicaid Other | Source: Ambulatory Visit | Attending: Internal Medicine | Admitting: Internal Medicine

## 2016-05-21 DIAGNOSIS — N6310 Unspecified lump in the right breast, unspecified quadrant: Secondary | ICD-10-CM

## 2016-05-21 DIAGNOSIS — N6320 Unspecified lump in the left breast, unspecified quadrant: Principal | ICD-10-CM

## 2016-05-21 DIAGNOSIS — N644 Mastodynia: Secondary | ICD-10-CM

## 2016-05-21 MED FILL — AMLODIPINE BESYLATE 5 MG TA: 5 | 30 days supply | Qty: 30 | Fill #7 | Status: TO

## 2016-05-21 MED FILL — CLOPIDOGREL 75 MG TABLET: 75 | 30 days supply | Qty: 30 | Fill #7 | Status: TO

## 2016-05-21 MED FILL — PANTOPRAZOLE SOD DR 40 MG T: 40 | 30 days supply | Qty: 60 | Fill #7 | Status: TO

## 2016-06-18 ENCOUNTER — Telehealth: Payer: Self-pay | Admitting: *Deleted

## 2016-06-18 DIAGNOSIS — I2583 Coronary atherosclerosis due to lipid rich plaque: Principal | ICD-10-CM

## 2016-06-18 DIAGNOSIS — I2510 Atherosclerotic heart disease of native coronary artery without angina pectoris: Secondary | ICD-10-CM

## 2016-06-18 DIAGNOSIS — E785 Hyperlipidemia, unspecified: Secondary | ICD-10-CM

## 2016-06-18 MED ORDER — ISOSORBIDE MONONITRATE ER 30 MG PO TB24: 90.0000 mg | ORAL_TABLET | Freq: Every day | ORAL | Status: DC

## 2016-06-18 NOTE — Telephone Encounter (Signed)
called to let pt know we will be sending in her RX to [facility] [address] at requested, VM was not set up, so could not leave a message.

## 2016-07-20 ENCOUNTER — Other Ambulatory Visit: Payer: Self-pay | Admitting: Cardiovascular Disease

## 2016-07-20 DIAGNOSIS — E785 Hyperlipidemia, unspecified: Secondary | ICD-10-CM

## 2016-07-20 DIAGNOSIS — I2510 Atherosclerotic heart disease of native coronary artery without angina pectoris: Secondary | ICD-10-CM

## 2016-07-20 DIAGNOSIS — I2583 Coronary atherosclerosis due to lipid rich plaque: Principal | ICD-10-CM

## 2016-09-24 ENCOUNTER — Other Ambulatory Visit: Payer: Self-pay | Admitting: *Deleted

## 2016-09-24 MED ORDER — RANOLAZINE ER 500 MG PO TB12: 500.0000 mg | ORAL_TABLET | Freq: Two times a day (BID) | ORAL | 11 refills | Status: DC

## 2016-09-24 MED ORDER — RANOLAZINE ER 500 MG PO TB12: 500.0000 mg | ORAL_TABLET | Freq: Two times a day (BID) | ORAL | 3 refills | Status: DC

## 2016-10-29 ENCOUNTER — Telehealth: Payer: Self-pay | Admitting: Cardiovascular Disease

## 2016-10-29 NOTE — Telephone Encounter (Signed)
Pt called because last night and this morning she had an episode of chest pain pt has taken many NTG's SL. Pt has a history of cocaine abuse. On April 16 th pt  had a bare metal stenting of the diagonal branch of the LAD it occurs while using cocaine. On January 2017 pt had   Drug-eluting stents .  This morning pt had an episode of chest pain like the last time that she had the heart attack she felt weak and broke down on a sweat. Pt wanted to know if she could come to this office to have blood work this evening to see if she had a heart attack. Pt was made aware that if she thinks she is having a heart attack she needs to go to the ER. Pt states that she feels fine now, and if the symptoms happened again she will go to the ER.

## 2016-10-29 NOTE — Telephone Encounter (Signed)
New Message  Pt voiced she broke out in a sweat and last time she had an heart attack and pt is very concerned.  Please f/u with pt

## 2016-10-30 ENCOUNTER — Emergency Department (HOSPITAL_COMMUNITY): Payer: Medicaid Other

## 2016-10-30 ENCOUNTER — Encounter (HOSPITAL_COMMUNITY): Payer: Self-pay

## 2016-10-30 ENCOUNTER — Telehealth: Payer: Self-pay | Admitting: Cardiovascular Disease

## 2016-10-30 ENCOUNTER — Inpatient Hospital Stay (HOSPITAL_COMMUNITY)
Admission: EM | Admit: 2016-10-30 | Discharge: 2016-11-17 | DRG: 215 | Disposition: A | Discharge status: Home Health | Payer: Medicaid Other | Attending: Thoracic Surgery (Cardiothoracic Vascular Surgery) | Admitting: Thoracic Surgery (Cardiothoracic Vascular Surgery)

## 2016-10-30 DIAGNOSIS — Z823 Family history of stroke: Secondary | ICD-10-CM

## 2016-10-30 DIAGNOSIS — F17200 Nicotine dependence, unspecified, uncomplicated: Secondary | ICD-10-CM | POA: Diagnosis present

## 2016-10-30 DIAGNOSIS — D62 Acute posthemorrhagic anemia: Secondary | ICD-10-CM | POA: Diagnosis not present

## 2016-10-30 DIAGNOSIS — T82855A Stenosis of coronary artery stent, initial encounter: Principal | ICD-10-CM | POA: Diagnosis present

## 2016-10-30 DIAGNOSIS — Z8249 Family history of ischemic heart disease and other diseases of the circulatory system: Secondary | ICD-10-CM

## 2016-10-30 DIAGNOSIS — F1190 Opioid use, unspecified, uncomplicated: Secondary | ICD-10-CM | POA: Diagnosis present

## 2016-10-30 DIAGNOSIS — I214 Non-ST elevation (NSTEMI) myocardial infarction: Secondary | ICD-10-CM | POA: Diagnosis present

## 2016-10-30 DIAGNOSIS — Z79891 Long term (current) use of opiate analgesic: Secondary | ICD-10-CM

## 2016-10-30 DIAGNOSIS — I469 Cardiac arrest, cause unspecified: Secondary | ICD-10-CM | POA: Diagnosis not present

## 2016-10-30 DIAGNOSIS — I10 Essential (primary) hypertension: Secondary | ICD-10-CM | POA: Diagnosis present

## 2016-10-30 DIAGNOSIS — J9600 Acute respiratory failure, unspecified whether with hypoxia or hypercapnia: Secondary | ICD-10-CM | POA: Diagnosis not present

## 2016-10-30 DIAGNOSIS — E785 Hyperlipidemia, unspecified: Secondary | ICD-10-CM | POA: Diagnosis present

## 2016-10-30 DIAGNOSIS — S301XXA Contusion of abdominal wall, initial encounter: Secondary | ICD-10-CM | POA: Diagnosis not present

## 2016-10-30 DIAGNOSIS — Z09 Encounter for follow-up examination after completed treatment for conditions other than malignant neoplasm: Secondary | ICD-10-CM

## 2016-10-30 DIAGNOSIS — F17210 Nicotine dependence, cigarettes, uncomplicated: Secondary | ICD-10-CM | POA: Diagnosis present

## 2016-10-30 DIAGNOSIS — I25110 Atherosclerotic heart disease of native coronary artery with unstable angina pectoris: Secondary | ICD-10-CM | POA: Diagnosis present

## 2016-10-30 DIAGNOSIS — D6959 Other secondary thrombocytopenia: Secondary | ICD-10-CM | POA: Diagnosis not present

## 2016-10-30 DIAGNOSIS — F32A Depression, unspecified: Secondary | ICD-10-CM | POA: Diagnosis present

## 2016-10-30 DIAGNOSIS — I5021 Acute systolic (congestive) heart failure: Secondary | ICD-10-CM | POA: Diagnosis not present

## 2016-10-30 DIAGNOSIS — I493 Ventricular premature depolarization: Secondary | ICD-10-CM | POA: Diagnosis not present

## 2016-10-30 DIAGNOSIS — G8929 Other chronic pain: Secondary | ICD-10-CM | POA: Diagnosis present

## 2016-10-30 DIAGNOSIS — T502X5A Adverse effect of carbonic-anhydrase inhibitors, benzothiadiazides and other diuretics, initial encounter: Secondary | ICD-10-CM | POA: Diagnosis not present

## 2016-10-30 DIAGNOSIS — Z7982 Long term (current) use of aspirin: Secondary | ICD-10-CM

## 2016-10-30 DIAGNOSIS — I4901 Ventricular fibrillation: Secondary | ICD-10-CM | POA: Diagnosis not present

## 2016-10-30 DIAGNOSIS — R079 Chest pain, unspecified: Secondary | ICD-10-CM | POA: Diagnosis present

## 2016-10-30 DIAGNOSIS — F419 Anxiety disorder, unspecified: Secondary | ICD-10-CM | POA: Diagnosis present

## 2016-10-30 DIAGNOSIS — Z888 Allergy status to other drugs, medicaments and biological substances status: Secondary | ICD-10-CM

## 2016-10-30 DIAGNOSIS — Z8673 Personal history of transient ischemic attack (TIA), and cerebral infarction without residual deficits: Secondary | ICD-10-CM

## 2016-10-30 DIAGNOSIS — Z9861 Coronary angioplasty status: Secondary | ICD-10-CM

## 2016-10-30 DIAGNOSIS — Z23 Encounter for immunization: Secondary | ICD-10-CM

## 2016-10-30 DIAGNOSIS — R570 Cardiogenic shock: Secondary | ICD-10-CM | POA: Diagnosis not present

## 2016-10-30 DIAGNOSIS — M549 Dorsalgia, unspecified: Secondary | ICD-10-CM | POA: Diagnosis present

## 2016-10-30 DIAGNOSIS — E876 Hypokalemia: Secondary | ICD-10-CM | POA: Diagnosis not present

## 2016-10-30 DIAGNOSIS — Y838 Other surgical procedures as the cause of abnormal reaction of the patient, or of later complication, without mention of misadventure at the time of the procedure: Secondary | ICD-10-CM | POA: Diagnosis not present

## 2016-10-30 DIAGNOSIS — I5082 Biventricular heart failure: Secondary | ICD-10-CM | POA: Diagnosis not present

## 2016-10-30 DIAGNOSIS — Z7902 Long term (current) use of antithrombotics/antiplatelets: Secondary | ICD-10-CM

## 2016-10-30 DIAGNOSIS — J939 Pneumothorax, unspecified: Secondary | ICD-10-CM | POA: Diagnosis not present

## 2016-10-30 DIAGNOSIS — L03116 Cellulitis of left lower limb: Secondary | ICD-10-CM | POA: Diagnosis not present

## 2016-10-30 DIAGNOSIS — I252 Old myocardial infarction: Secondary | ICD-10-CM

## 2016-10-30 DIAGNOSIS — I7389 Other specified peripheral vascular diseases: Secondary | ICD-10-CM | POA: Diagnosis present

## 2016-10-30 DIAGNOSIS — F329 Major depressive disorder, single episode, unspecified: Secondary | ICD-10-CM | POA: Diagnosis present

## 2016-10-30 DIAGNOSIS — I462 Cardiac arrest due to underlying cardiac condition: Secondary | ICD-10-CM | POA: Diagnosis not present

## 2016-10-30 DIAGNOSIS — I509 Heart failure, unspecified: Secondary | ICD-10-CM

## 2016-10-30 DIAGNOSIS — I2510 Atherosclerotic heart disease of native coronary artery without angina pectoris: Secondary | ICD-10-CM

## 2016-10-30 DIAGNOSIS — I110 Hypertensive heart disease with heart failure: Secondary | ICD-10-CM | POA: Diagnosis not present

## 2016-10-30 DIAGNOSIS — J9811 Atelectasis: Secondary | ICD-10-CM

## 2016-10-30 DIAGNOSIS — Z951 Presence of aortocoronary bypass graft: Secondary | ICD-10-CM

## 2016-10-30 DIAGNOSIS — I200 Unstable angina: Secondary | ICD-10-CM | POA: Diagnosis present

## 2016-10-30 DIAGNOSIS — I97120 Postprocedural cardiac arrest following cardiac surgery: Secondary | ICD-10-CM | POA: Diagnosis not present

## 2016-10-30 DIAGNOSIS — F319 Bipolar disorder, unspecified: Secondary | ICD-10-CM | POA: Diagnosis present

## 2016-10-30 DIAGNOSIS — Z79899 Other long term (current) drug therapy: Secondary | ICD-10-CM

## 2016-10-30 DIAGNOSIS — Z95811 Presence of heart assist device: Secondary | ICD-10-CM

## 2016-10-30 HISTORY — DX: Presence of aortocoronary bypass graft: Z95.1

## 2016-10-30 HISTORY — DX: Cardiogenic shock: R57.0

## 2016-10-30 HISTORY — DX: Cardiac arrest, cause unspecified: I46.9

## 2016-10-30 LAB — CBC WITH DIFFERENTIAL/PLATELET
BASOS ABS: 0 10*3/uL (ref 0.0–0.1)
BASOS PCT: 0 %
EOS PCT: 1 %
Eosinophils Absolute: 0.1 10*3/uL (ref 0.0–0.7)
HCT: 38 % (ref 36.0–46.0)
Hemoglobin: 13.3 g/dL (ref 12.0–15.0)
Lymphocytes Relative: 25 %
Lymphs Abs: 1.5 10*3/uL (ref 0.7–4.0)
MCH: 30.9 pg (ref 26.0–34.0)
MCHC: 35 g/dL (ref 30.0–36.0)
MCV: 88.2 fL (ref 78.0–100.0)
MONO ABS: 0.4 10*3/uL (ref 0.1–1.0)
Monocytes Relative: 6 %
NEUTROS ABS: 3.9 10*3/uL (ref 1.7–7.7)
Neutrophils Relative %: 68 %
PLATELETS: 249 10*3/uL (ref 150–400)
RBC: 4.31 MIL/uL (ref 3.87–5.11)
RDW: 12.2 % (ref 11.5–15.5)
WBC: 5.8 10*3/uL (ref 4.0–10.5)

## 2016-10-30 LAB — COMPREHENSIVE METABOLIC PANEL
ALBUMIN: 4.1 g/dL (ref 3.5–5.0)
ALT: 15 U/L (ref 14–54)
AST: 19 U/L (ref 15–41)
Alkaline Phosphatase: 106 U/L (ref 38–126)
Anion gap: 8 (ref 5–15)
BUN: 6 mg/dL (ref 6–20)
CHLORIDE: 105 mmol/L (ref 101–111)
CO2: 24 mmol/L (ref 22–32)
Calcium: 9.1 mg/dL (ref 8.9–10.3)
Creatinine, Ser: 0.68 mg/dL (ref 0.44–1.00)
GFR calc Af Amer: >60 mL/min (ref >60)
GFR calc non Af Amer: >60 mL/min (ref >60)
GLUCOSE: 110 mg/dL — AB (ref 65–99)
POTASSIUM: 3.5 mmol/L (ref 3.5–5.1)
Sodium: 137 mmol/L (ref 135–145)
Total Bilirubin: 0.7 mg/dL (ref 0.3–1.2)
Total Protein: 6.8 g/dL (ref 6.5–8.1)

## 2016-10-30 LAB — RAPID URINE DRUG SCREEN, HOSP PERFORMED
AMPHETAMINES: POSITIVE — AB
BARBITURATES: NOT DETECTED
BENZODIAZEPINES: NOT DETECTED
Cocaine: NOT DETECTED
Opiates: NOT DETECTED
TETRAHYDROCANNABINOL: NOT DETECTED

## 2016-10-30 LAB — TROPONIN I: Troponin I: 0.03 ng/mL (ref <0.03)

## 2016-10-30 MED ORDER — MORPHINE SULFATE (PF) 4 MG/ML IV SOLN
1.0000 mg | Freq: Once | INTRAVENOUS | Status: AC
  Administered 2016-10-30: 1 mg via INTRAVENOUS
  Filled 2016-10-30: qty 1

## 2016-10-30 MED ORDER — NITROGLYCERIN IN D5W 200-5 MCG/ML-% IV SOLN: 0.0000 ug/min | Freq: Once | INTRAVENOUS | Status: DC

## 2016-10-30 MED ORDER — NITROGLYCERIN 0.4 MG SL SUBL
SUBLINGUAL_TABLET | SUBLINGUAL | Status: AC
  Filled 2016-10-30: qty 1

## 2016-10-30 MED ORDER — SODIUM CHLORIDE 0.9 % IV SOLN
INTRAVENOUS | Status: DC
  Administered 2016-10-30: 19:00:00 via INTRAVENOUS

## 2016-10-30 MED ORDER — NITROGLYCERIN 2 % TD OINT
0.5000 [in_us] | TOPICAL_OINTMENT | Freq: Once | TRANSDERMAL | Status: AC
  Administered 2016-10-30: 0.5 [in_us] via TOPICAL

## 2016-10-30 MED ORDER — HEPARIN (PORCINE) IN NACL 100-0.45 UNIT/ML-% IJ SOLN
1200.0000 [IU]/h | INTRAMUSCULAR | Status: DC
  Administered 2016-10-30: 900 [IU]/h via INTRAVENOUS
  Administered 2016-10-31: 1200 [IU]/h via INTRAVENOUS
  Filled 2016-10-30: qty 250

## 2016-10-30 MED ORDER — LORAZEPAM 2 MG/ML IJ SOLN
0.5000 mg | Freq: Once | INTRAMUSCULAR | Status: AC
  Administered 2016-10-30: 0.5 mg via INTRAVENOUS
  Filled 2016-10-30: qty 1

## 2016-10-30 MED ORDER — NITROGLYCERIN 0.4 MG SL SUBL
0.4000 mg | SUBLINGUAL_TABLET | SUBLINGUAL | Status: DC | PRN
  Administered 2016-10-30 – 2016-10-31 (×6): 0.4 mg via SUBLINGUAL
  Filled 2016-10-30: qty 1

## 2016-10-30 MED ORDER — HEPARIN BOLUS VIA INFUSION
4000.0000 [IU] | Freq: Once | INTRAVENOUS | Status: AC
  Administered 2016-10-30: 4000 [IU] via INTRAVENOUS
  Filled 2016-10-30: qty 4000

## 2016-10-30 NOTE — Telephone Encounter (Signed)
Called pt to inform her that we do not have samples of nitroglycerin and that she needed to get it from her pharmacy. Pt stated that her insurance will not pay for it for 4 more days and that she is running out of them. Pt stated that she takes many, I inform the pt that she is not supposed to take more than 3 in a day and if pain persist to go to the ER. Pt verbalized understanding.  FYI

## 2016-10-30 NOTE — ED Provider Notes (Signed)
[facility]Y DEPT Provider Note   CSN: [id number] Arrival date & time: 10/30/16  1839     History   Chief Complaint Chief Complaint  Patient presents with  . Chest Pain    HPI Christina Morrison is a 40 y.o. female.  40 year old female with history of CAD, status post coronary artery stent placement to cocaine use presents with 3 days of intermittent substernal chest discomfort worse with exertion and better with rest. Symptoms seem to be worse at night and are associated with diaphoresis and dyspnea. Denies any cough or congestion. No fever or chills. No leg pain or swelling. Has been using nitroglycerin with temporary relief. States compliant with medications and denies any current use of substance abuse.      Past Medical History:  Diagnosis Date  . Anxiety   . Bipolar 1 disorder (HCC)   . Chronic pain   . Cocaine use   . Coronary artery disease    a. 03/2015 NSTEMI/PCI in setting of cocaine use - BMS to diagonal. b. NSTEMI 12/2015 s/o overlapping DES to Cx, residual mild RCA and LAD disease, 75% OM1.  . Depression   . Dyslipidemia   . Migraine   . Osteoarthritis   . Polysubstance abuse    a. tobacco/cocaine  . Scoliosis   . TIA (transient ischemic attack)     Patient Active Problem List   Diagnosis Date Noted  . Dyslipidemia 04/11/2015  . Unstable angina (HCC) 04/11/2015  . NSTEMI (non-ST elevated myocardial infarction) (HCC) 03/23/2015  . CAD S/P percutaneous coronary angioplasty 03/23/2015  . Cocaine abuse 03/23/2015  . Chronic lower back pain 01/24/2015  . Family history of diabetes mellitus (DM) 01/24/2015  . Anxiety and depression 01/24/2015  . Tobacco use disorder 01/24/2015  . IUD (intrauterine device) in place 01/24/2015    Past Surgical History:  Procedure Laterality Date  . CARDIAC CATHETERIZATION  03/21/2015   Procedure: CORONARY STENT INTERVENTION;  Surgeon: Tonny BollmanMichael Cooper, MD;  [facility];  Service: Cardiovascular;;  diag bms 2.75 x 12  vision  . CARDIAC CATHETERIZATION N/A 01/14/2016   Procedure: Coronary Stent Intervention;  Surgeon: Tonny BollmanMichael Cooper, MD;  [facility];  Service: Cardiovascular;  Laterality: N/A;  . CORONARY STENT PLACEMENT  03/21/2015   first diagonal  . ESOPHAGOGASTRODUODENOSCOPY (EGD) WITH PROPOFOL N/A 06/11/2015   Procedure: ESOPHAGOGASTRODUODENOSCOPY (EGD) WITH PROPOFOL;  Surgeon: Bernette Redbirdobert Buccini, MD;  [facility];  Service: Endoscopy;  Laterality: N/A;  . LEFT HEART CATHETERIZATION WITH CORONARY ANGIOGRAM N/A 03/21/2015   Procedure: LEFT HEART CATHETERIZATION WITH CORONARY ANGIOGRAM;  Surgeon: Tonny BollmanMichael Cooper, MD;  [facility];  Service: Cardiovascular;  Laterality: N/A;  . LEFT HEART CATHETERIZATION WITH CORONARY ANGIOGRAM N/A 04/11/2015   Procedure: LEFT HEART CATHETERIZATION WITH CORONARY ANGIOGRAM;  Surgeon: Kathleene Hazelhristopher D McAlhany, MD;  [facility];  Service: Cardiovascular;  Laterality: N/A;    OB History    Gravida Para Term Preterm AB Living   2 2 2  0 0 2   SAB TAB Ectopic Multiple Live Births   0 0 0 0         Home Medications    Prior to Admission medications   Medication Sig Start Date End Date Taking? Authorizing Provider  acetaminophen (TYLENOL) 325 MG tablet Take 2 tablets (650 mg total) by mouth every 4 (four) hours as needed for headache or mild pain. 01/15/16   Abelino DerrickLuke K Kilroy, PA-C  ALPRAZolam Prudy Feeler(XANAX) 0.5 MG tablet Take 0.5 mg by mouth 3 (  three) times daily.    Historical Provider, MD  amLODipine (NORVASC) 5 MG tablet TAKE 1 TABLET BY MOUTH DAILY 07/21/16   [name], MD  aspirin EC 81 MG EC tablet Take 1 tablet (81 mg total) by mouth daily. 03/23/15   Ok Anis, NP  atorvastatin (LIPITOR) 40 MG tablet Take 1 tablet (40 mg total) by mouth daily. 03/13/16   Leone Brand, NP  buPROPion [facility] SR) 150 MG 12 hr tablet Take 1-2 tablets (150-300 mg total) by mouth 2 (two) times daily. Takes 300mg  in am and 150mg  in pm 06/11/15    Joseph Art, DO  clopidogrel (PLAVIX) 75 MG tablet TAKE 1 TABLET BY MOUTH DAILY WITH BREAKFAST 07/21/16   [name], MD  Diclofenac Sodium 3 % GEL Apply 1 application topically 4 (four) times daily as needed. Pain 05/01/16   Historical Provider, MD  Doxepin HCl 5 % CREA Apply 1 application topically 3 (three) times daily as needed. Pain 05/01/16   Historical Provider, MD  escitalopram (LEXAPRO) 20 MG tablet Take 20 mg by mouth daily. 01/18/16   Historical Provider, MD  fluticasone (FLONASE) 50 MCG/ACT nasal spray Place 2 sprays into both nostrils daily as needed for allergies or rhinitis.    Historical Provider, MD  guaiFENesin (MUCINEX) 600 MG 12 hr tablet Take 600 mg by mouth 2 (two) times daily as needed for cough or to loosen phlegm.    Historical Provider, MD  isosorbide mononitrate (IMDUR) 30 MG 24 hr tablet Take 3 tablets (90 mg total) by mouth daily. 06/18/16   [name], MD  lidocaine (XYLOCAINE) 5 % ointment Apply 1 application topically 4 (four) times daily as needed. pain 05/01/16   Historical Provider, MD  nitroGLYCERIN (NITROSTAT) 0.4 MG SL tablet Place 1 tablet (0.4 mg total) under the tongue every 5 (five) minutes as needed for chest pain. 05/20/16   [name], MD  Oxycodone HCl 10 MG TABS Take 10 mg by mouth 3 (three) times daily as needed (for pain).     Historical Provider, MD  pantoprazole (PROTONIX) 40 MG tablet TAKE 1 TABLET BY MOUTH TWICE A DAY BEFORE MEALS 07/21/16   [name], MD  ranolazine (RANEXA) 500 MG 12 hr tablet Take 1 tablet (500 mg total) by mouth 2 (two) times daily. 09/24/16   [name], MD    Family History Family History  Problem Relation Age of Onset  . Mental retardation Mother   . Hypertension Mother   . Hyperlipidemia Mother   . Heart disease Mother   . Depression Mother   . Hypertension Father   . Hyperlipidemia Father   . Heart disease Father   . Mental retardation Father   . Diabetes Father   . Cancer Maternal Aunt   . Stroke  Maternal Grandmother     Social History Social History  Substance Use Topics  . Smoking status: Current Every Day Smoker    Packs/day: 0.10    Types: Cigarettes  . Smokeless tobacco: Never Used  . Alcohol use No     Allergies   Ranexa [ranolazine]   Review of Systems Review of Systems  All other systems reviewed and are negative.    Physical Exam Updated Vital Signs BP 131/83 (BP Location: Right Arm)   Pulse 81   Resp 20   LMP 10/27/2016 (Exact Date)   SpO2 100%   Physical Exam  Constitutional: She is oriented to person, place, and time. She appears well-developed and well-nourished.  Non-toxic  appearance. No distress.  HENT:  Head: Normocephalic and atraumatic.  Eyes: Conjunctivae, EOM and lids are normal. Pupils are equal, round, and reactive to light.  Neck: Normal range of motion. Neck supple. No tracheal deviation present. No thyroid mass present.  Cardiovascular: Normal rate, regular rhythm and normal heart sounds.  Exam reveals no gallop.   No murmur heard. Pulmonary/Chest: Effort normal and breath sounds normal. No stridor. No respiratory distress. She has no decreased breath sounds. She has no wheezes. She has no rhonchi. She has no rales.  Abdominal: Soft. Normal appearance and bowel sounds are normal. She exhibits no distension. There is no tenderness. There is no rebound and no CVA tenderness.  Musculoskeletal: Normal range of motion. She exhibits no edema or tenderness.  Neurological: She is alert and oriented to person, place, and time. She has normal strength. No cranial nerve deficit or sensory deficit. GCS eye subscore is 4. GCS verbal subscore is 5. GCS motor subscore is 6.  Skin: Skin is warm and dry. No abrasion and no rash noted.  Psychiatric: She has a normal mood and affect. Her speech is normal and behavior is normal.  Nursing note and vitals reviewed.    ED Treatments / Results  Labs (all labs ordered are listed, but only abnormal results  are displayed) Labs Reviewed - No data to display  EKG  EKG Interpretation  Date/Time:  Thursday October 30 2016 18:41:48 EST Ventricular Rate:  82 PR Interval:    QRS Duration: 90 QT Interval:  388 QTC Calculation: 454 R Axis:   57 Text Interpretation:  Sinus rhythm Confirmed by Freida BusmanALLEN  MD, Halynn Reitano ([id number]) on 10/30/2016 6:51:35 PM       Radiology No results found.  Procedures Procedures (including critical care time)  Medications Ordered in ED Medications - No data to display   Initial Impression / Assessment and Plan / ED Course  I have reviewed the triage vital signs and the nursing notes.  Pertinent labs & imaging results that were available during my care of the patient were reviewed by me and considered in my medical decision making (see chart for details).  Clinical Course     Patient given nitroglycerin which helped her symptoms slightly. Also was offered as questioned drip which she has deferred. Patient did request Dilaudid which I have deferred. I repeated her EKG twice and it was without acute changes. Started on heparin. Does have chronically elevated troponins. Patient evaluated by cardiology and feels that this is more medical admission. Will consult hospitalist  Final Clinical Impressions(s) / ED Diagnoses   Final diagnoses:  None    New Prescriptions New Prescriptions   No medications on file     Lorre NickAnthony Diani Jillson, MD 10/30/16 2212

## 2016-10-30 NOTE — Telephone Encounter (Signed)
New Message  Patient calling the office for samples of medication:   1.  What medication and dosage are you requesting samples for? Nitroglycerin .4mg   2.  Are you currently out of this medication?  Yes

## 2016-10-30 NOTE — ED Notes (Signed)
Made MD aware of patient's continued requests for pain management.

## 2016-10-30 NOTE — ED Triage Notes (Signed)
[facility]- pt reports she has chest pain that began 3 days ago. She reports hx of NSTEMI. Pt states that with CP she has dizziness, nausea, and diaphoresis. 324mg  of ASA administered PTA. Pt reports relief with nitro.

## 2016-10-30 NOTE — ED Notes (Signed)
Pt continuing to call out for pain medication, offered nitro drip again and tylenol. Pt refusing. Updated on plan of care for cardiology to see.

## 2016-10-30 NOTE — ED Notes (Signed)
Notified MD of patient's requests for pain meds for chronic pain. No new orders at this time.

## 2016-10-30 NOTE — ED Notes (Signed)
Pt requesting oxycodone or dilaudid for chronic back pain.

## 2016-10-30 NOTE — Progress Notes (Signed)
ANTICOAGULATION CONSULT NOTE - Initial Consult  Pharmacy Consult for Heparin Indication: chest pain/ACS  Allergies  Allergen Reactions  . Ranexa [Ranolazine] Nausea Only    Dizziness (only the 1,000 mg dose has this effect)    Patient Measurements: IBW: 47.8 kg ABW: 74.7 kg (June 2017) Heparin Dosing Weight: 64.4 kg  Vital Signs: Temp: 98.1 F (36.7 C) (11/16 1848) Temp Source: Oral (11/16 1848) BP: 96/63 (11/16 2028) Pulse Rate: 70 (11/16 2028)  Labs:  Recent Labs  10/30/16 2000  HGB 13.3  HCT 38.0  PLT 249  CREATININE 0.68  TROPONINI 0.03*    CrCl cannot be calculated (Unknown ideal weight.).   Medical History: Past Medical History:  Diagnosis Date  . Anxiety   . Bipolar 1 disorder (HCC)   . Chronic pain   . Cocaine use   . Coronary artery disease    a. 03/2015 NSTEMI/PCI in setting of cocaine use - BMS to diagonal. b. NSTEMI 12/2015 s/o overlapping DES to Cx, residual mild RCA and LAD disease, 75% OM1.  . Depression   . Dyslipidemia   . Migraine   . Osteoarthritis   . Polysubstance abuse    a. tobacco/cocaine  . Scoliosis   . TIA (transient ischemic attack)     Medications:  F/u med rec  Assessment: CP x 3d 40 y/o F presents with h/o CAD and c/o CP x 3d. Patient has h/o chronic pain. Refusing NTG drip and requesting Dilaudid. Troponin 0.03 x 1  Admit labs: Scr 0.68, K=3.5, Hgb 13.3, Plts 249  Goal of Therapy:  Heparin level 0.3-0.7 units/ml Monitor platelets by anticoagulation protocol: Yes   Plan:  F/u current weight Heparin 4000 unit IV bolus Heparin infusion 900 units/hr Daily HL and CBC  Versa Craton S. Merilynn Finlandobertson, PharmD, BCPS Clinical Staff Pharmacist Pager [phone]  Misty Stanleyobertson, Dmario Russom Stillinger 10/30/2016,9:41 PM

## 2016-10-30 NOTE — ED Notes (Signed)
Pt attempting to take nitro from purse. Redirected patient to avoid taking her own medication.

## 2016-10-30 NOTE — ED Notes (Signed)
Admitting MD at bedside.

## 2016-10-30 NOTE — Telephone Encounter (Signed)
Pt is scheduled to see Tereso NewcomerScott Weaver PA-C 11/03/2016.

## 2016-10-30 NOTE — ED Notes (Signed)
Pt requesting diluadid for pain because morphine "don't work." Dr. Freida BusmanAllen notified, verbal order for nitropaste and tylenol

## 2016-10-31 ENCOUNTER — Encounter (HOSPITAL_COMMUNITY)
Admission: EM | Disposition: A | Discharge status: Home Health | Payer: Self-pay | Source: Home / Self Care | Attending: Thoracic Surgery (Cardiothoracic Vascular Surgery)

## 2016-10-31 ENCOUNTER — Encounter (HOSPITAL_COMMUNITY): Payer: Self-pay | Admitting: Internal Medicine

## 2016-10-31 DIAGNOSIS — Z9861 Coronary angioplasty status: Secondary | ICD-10-CM

## 2016-10-31 DIAGNOSIS — J9600 Acute respiratory failure, unspecified whether with hypoxia or hypercapnia: Secondary | ICD-10-CM | POA: Diagnosis not present

## 2016-10-31 DIAGNOSIS — D62 Acute posthemorrhagic anemia: Secondary | ICD-10-CM | POA: Diagnosis not present

## 2016-10-31 DIAGNOSIS — I4901 Ventricular fibrillation: Secondary | ICD-10-CM | POA: Diagnosis not present

## 2016-10-31 DIAGNOSIS — I200 Unstable angina: Secondary | ICD-10-CM | POA: Diagnosis present

## 2016-10-31 DIAGNOSIS — I25110 Atherosclerotic heart disease of native coronary artery with unstable angina pectoris: Secondary | ICD-10-CM | POA: Diagnosis present

## 2016-10-31 DIAGNOSIS — J9811 Atelectasis: Secondary | ICD-10-CM | POA: Diagnosis not present

## 2016-10-31 DIAGNOSIS — R072 Precordial pain: Secondary | ICD-10-CM | POA: Diagnosis not present

## 2016-10-31 DIAGNOSIS — R079 Chest pain, unspecified: Secondary | ICD-10-CM

## 2016-10-31 DIAGNOSIS — Z95811 Presence of heart assist device: Secondary | ICD-10-CM | POA: Diagnosis not present

## 2016-10-31 DIAGNOSIS — I10 Essential (primary) hypertension: Secondary | ICD-10-CM

## 2016-10-31 DIAGNOSIS — F329 Major depressive disorder, single episode, unspecified: Secondary | ICD-10-CM | POA: Diagnosis not present

## 2016-10-31 DIAGNOSIS — Y838 Other surgical procedures as the cause of abnormal reaction of the patient, or of later complication, without mention of misadventure at the time of the procedure: Secondary | ICD-10-CM | POA: Diagnosis not present

## 2016-10-31 DIAGNOSIS — E785 Hyperlipidemia, unspecified: Secondary | ICD-10-CM

## 2016-10-31 DIAGNOSIS — I2510 Atherosclerotic heart disease of native coronary artery without angina pectoris: Secondary | ICD-10-CM

## 2016-10-31 DIAGNOSIS — I5021 Acute systolic (congestive) heart failure: Secondary | ICD-10-CM | POA: Diagnosis not present

## 2016-10-31 DIAGNOSIS — L03116 Cellulitis of left lower limb: Secondary | ICD-10-CM | POA: Diagnosis not present

## 2016-10-31 DIAGNOSIS — F419 Anxiety disorder, unspecified: Secondary | ICD-10-CM | POA: Diagnosis present

## 2016-10-31 DIAGNOSIS — F319 Bipolar disorder, unspecified: Secondary | ICD-10-CM | POA: Diagnosis present

## 2016-10-31 DIAGNOSIS — I214 Non-ST elevation (NSTEMI) myocardial infarction: Secondary | ICD-10-CM | POA: Diagnosis present

## 2016-10-31 DIAGNOSIS — F17210 Nicotine dependence, cigarettes, uncomplicated: Secondary | ICD-10-CM | POA: Diagnosis present

## 2016-10-31 DIAGNOSIS — I97120 Postprocedural cardiac arrest following cardiac surgery: Secondary | ICD-10-CM | POA: Diagnosis not present

## 2016-10-31 DIAGNOSIS — T82855A Stenosis of coronary artery stent, initial encounter: Secondary | ICD-10-CM | POA: Diagnosis present

## 2016-10-31 DIAGNOSIS — R570 Cardiogenic shock: Secondary | ICD-10-CM | POA: Diagnosis not present

## 2016-10-31 DIAGNOSIS — Z951 Presence of aortocoronary bypass graft: Secondary | ICD-10-CM | POA: Diagnosis not present

## 2016-10-31 DIAGNOSIS — I462 Cardiac arrest due to underlying cardiac condition: Secondary | ICD-10-CM | POA: Diagnosis not present

## 2016-10-31 DIAGNOSIS — Z23 Encounter for immunization: Secondary | ICD-10-CM | POA: Diagnosis not present

## 2016-10-31 DIAGNOSIS — I97638 Postprocedural hematoma of a circulatory system organ or structure following other circulatory system procedure: Secondary | ICD-10-CM | POA: Diagnosis not present

## 2016-10-31 DIAGNOSIS — I509 Heart failure, unspecified: Secondary | ICD-10-CM | POA: Diagnosis not present

## 2016-10-31 DIAGNOSIS — I110 Hypertensive heart disease with heart failure: Secondary | ICD-10-CM | POA: Diagnosis not present

## 2016-10-31 DIAGNOSIS — J939 Pneumothorax, unspecified: Secondary | ICD-10-CM | POA: Diagnosis not present

## 2016-10-31 DIAGNOSIS — R5082 Postprocedural fever: Secondary | ICD-10-CM | POA: Diagnosis not present

## 2016-10-31 DIAGNOSIS — I469 Cardiac arrest, cause unspecified: Secondary | ICD-10-CM | POA: Diagnosis not present

## 2016-10-31 HISTORY — PX: CARDIAC CATHETERIZATION: SHX172

## 2016-10-31 LAB — CBC
HCT: 38.3 % (ref 36.0–46.0)
HEMOGLOBIN: 13.6 g/dL (ref 12.0–15.0)
MCH: 31.3 pg (ref 26.0–34.0)
MCHC: 35.5 g/dL (ref 30.0–36.0)
MCV: 88.2 fL (ref 78.0–100.0)
Platelets: 246 10*3/uL (ref 150–400)
RBC: 4.34 MIL/uL (ref 3.87–5.11)
RDW: 12.1 % (ref 11.5–15.5)
WBC: 5.2 10*3/uL (ref 4.0–10.5)

## 2016-10-31 LAB — TROPONIN I
TROPONIN I: 0.06 ng/mL — AB (ref <0.03)
Troponin I: 0.09 ng/mL (ref <0.03)
Troponin I: 0.09 ng/mL (ref <0.03)

## 2016-10-31 LAB — GLUCOSE, CAPILLARY: Glucose-Capillary: 113 mg/dL — ABNORMAL HIGH (ref 65–99)

## 2016-10-31 LAB — PREGNANCY, URINE: Preg Test, Ur: NEGATIVE

## 2016-10-31 LAB — HEPARIN LEVEL (UNFRACTIONATED)
HEPARIN UNFRACTIONATED: 0.2 [IU]/mL — AB (ref 0.30–0.70)
Heparin Unfractionated: 0.25 IU/mL — ABNORMAL LOW (ref 0.30–0.70)

## 2016-10-31 LAB — POCT ACTIVATED CLOTTING TIME: ACTIVATED CLOTTING TIME: 313 s

## 2016-10-31 LAB — MRSA PCR SCREENING: MRSA BY PCR: NEGATIVE

## 2016-10-31 SURGERY — LEFT HEART CATH AND CORONARY ANGIOGRAPHY
Anesthesia: LOCAL

## 2016-10-31 MED ORDER — ONDANSETRON HCL 4 MG/2ML IJ SOLN
INTRAMUSCULAR | Status: AC
  Filled 2016-10-31: qty 2

## 2016-10-31 MED ORDER — FLUTICASONE PROPIONATE 50 MCG/ACT NA SUSP: 2.0000 | Freq: Every day | NASAL | Status: DC | PRN

## 2016-10-31 MED ORDER — INFLUENZA VAC SPLIT QUAD 0.5 ML IM SUSY
0.5000 mL | PREFILLED_SYRINGE | INTRAMUSCULAR | Status: AC
Start: 2016-10-31 — End: 2016-10-31
  Administered 2016-10-31: 0.5 mL via INTRAMUSCULAR
  Filled 2016-10-31: qty 0.5

## 2016-10-31 MED ORDER — HEPARIN (PORCINE) IN NACL 2-0.9 UNIT/ML-% IJ SOLN
INTRAMUSCULAR | Status: DC | PRN
  Administered 2016-10-31: 19:00:00

## 2016-10-31 MED ORDER — NITROGLYCERIN 0.4 MG SL SUBL
SUBLINGUAL_TABLET | SUBLINGUAL | Status: AC
  Administered 2016-10-31: 0.4 mg
  Filled 2016-10-31: qty 1

## 2016-10-31 MED ORDER — NITROGLYCERIN IN D5W 200-5 MCG/ML-% IV SOLN
INTRAVENOUS | Status: AC
  Filled 2016-10-31: qty 250

## 2016-10-31 MED ORDER — IOPAMIDOL (ISOVUE-370) INJECTION 76%
INTRAVENOUS | Status: AC
  Filled 2016-10-31: qty 100

## 2016-10-31 MED ORDER — ISOSORBIDE MONONITRATE ER 30 MG PO TB24: 30.0000 mg | ORAL_TABLET | ORAL | Status: DC

## 2016-10-31 MED ORDER — BUPROPION HCL ER (XL) 150 MG PO TB24
300.0000 mg | ORAL_TABLET | Freq: Every day | ORAL | Status: DC
  Administered 2016-10-31 – 2016-11-02 (×3): 300 mg via ORAL
  Filled 2016-10-31 (×4): qty 1

## 2016-10-31 MED ORDER — NITROGLYCERIN 0.4 MG SL SUBL
SUBLINGUAL_TABLET | SUBLINGUAL | Status: AC
  Filled 2016-10-31: qty 1

## 2016-10-31 MED ORDER — TIROFIBAN HCL IN NACL 5-0.9 MG/100ML-% IV SOLN
INTRAVENOUS | Status: DC | PRN
  Administered 2016-10-31: 0.15 ug/kg/min via INTRAVENOUS

## 2016-10-31 MED ORDER — HEPARIN SODIUM (PORCINE) 1000 UNIT/ML IJ SOLN
INTRAMUSCULAR | Status: DC | PRN
  Administered 2016-10-31 (×2): 3500 [IU] via INTRAVENOUS

## 2016-10-31 MED ORDER — DOXEPIN HCL 10 MG PO CAPS
10.0000 mg | ORAL_CAPSULE | Freq: Every day | ORAL | Status: DC
  Administered 2016-10-31: 20 mg via ORAL
  Administered 2016-11-01: 10 mg via ORAL
  Administered 2016-11-02: 20 mg via ORAL
  Filled 2016-10-31 (×6): qty 2

## 2016-10-31 MED ORDER — SODIUM CHLORIDE 0.9 % IV SOLN
INTRAVENOUS | Status: AC
  Administered 2016-10-31: 21:00:00 via INTRAVENOUS

## 2016-10-31 MED ORDER — MORPHINE SULFATE (PF) 4 MG/ML IV SOLN
2.0000 mg | INTRAVENOUS | Status: DC | PRN
  Administered 2016-10-31 – 2016-11-01 (×10): 2 mg via INTRAVENOUS
  Filled 2016-10-31 (×11): qty 1

## 2016-10-31 MED ORDER — IOPAMIDOL (ISOVUE-370) INJECTION 76%
INTRAVENOUS | Status: AC
  Filled 2016-10-31: qty 50

## 2016-10-31 MED ORDER — NITROGLYCERIN IN D5W 200-5 MCG/ML-% IV SOLN
INTRAVENOUS | Status: DC | PRN
  Administered 2016-10-31: 5 ug/min via INTRAVENOUS

## 2016-10-31 MED ORDER — AMLODIPINE BESYLATE 5 MG PO TABS
5.0000 mg | ORAL_TABLET | Freq: Every day | ORAL | Status: DC
  Administered 2016-10-31: 5 mg via ORAL
  Filled 2016-10-31 (×2): qty 1

## 2016-10-31 MED ORDER — LIDOCAINE HCL (PF) 1 % IJ SOLN
INTRAMUSCULAR | Status: AC
  Filled 2016-10-31: qty 30

## 2016-10-31 MED ORDER — ESCITALOPRAM OXALATE 10 MG PO TABS
20.0000 mg | ORAL_TABLET | Freq: Every day | ORAL | Status: DC
  Administered 2016-10-31 – 2016-11-02 (×3): 20 mg via ORAL
  Filled 2016-10-31 (×3): qty 2

## 2016-10-31 MED ORDER — CLOPIDOGREL BISULFATE 75 MG PO TABS
75.0000 mg | ORAL_TABLET | Freq: Every day | ORAL | Status: DC
  Administered 2016-10-31: 75 mg via ORAL
  Filled 2016-10-31: qty 1

## 2016-10-31 MED ORDER — ATORVASTATIN CALCIUM 40 MG PO TABS
40.0000 mg | ORAL_TABLET | Freq: Every day | ORAL | Status: DC
  Administered 2016-10-31 – 2016-11-02 (×3): 40 mg via ORAL
  Filled 2016-10-31 (×3): qty 1

## 2016-10-31 MED ORDER — PANTOPRAZOLE SODIUM 40 MG PO TBEC
40.0000 mg | DELAYED_RELEASE_TABLET | Freq: Every day | ORAL | Status: DC
  Administered 2016-10-31 – 2016-11-02 (×3): 40 mg via ORAL
  Filled 2016-10-31 (×3): qty 1

## 2016-10-31 MED ORDER — SODIUM CHLORIDE 0.9% FLUSH: 3.0000 mL | INTRAVENOUS | Status: DC | PRN

## 2016-10-31 MED ORDER — TIROFIBAN HCL IN NACL 5-0.9 MG/100ML-% IV SOLN
0.1500 ug/kg/min | INTRAVENOUS | Status: DC
  Administered 2016-10-31 – 2016-11-01 (×3): 0.15 ug/kg/min via INTRAVENOUS
  Filled 2016-10-31 (×2): qty 100

## 2016-10-31 MED ORDER — RANOLAZINE ER 500 MG PO TB12
500.0000 mg | ORAL_TABLET | Freq: Two times a day (BID) | ORAL | Status: DC
  Administered 2016-10-31 – 2016-11-02 (×6): 500 mg via ORAL
  Filled 2016-10-31 (×6): qty 1

## 2016-10-31 MED ORDER — LIDOCAINE HCL (PF) 1 % IJ SOLN
INTRAMUSCULAR | Status: DC | PRN
  Administered 2016-10-31: 2 mL

## 2016-10-31 MED ORDER — ISOSORBIDE MONONITRATE ER 60 MG PO TB24
60.0000 mg | ORAL_TABLET | ORAL | Status: DC
  Administered 2016-10-31 – 2016-11-01 (×2): 60 mg via ORAL
  Filled 2016-10-31: qty 2
  Filled 2016-10-31: qty 1

## 2016-10-31 MED ORDER — NITROGLYCERIN 0.3 MG SL SUBL
SUBLINGUAL_TABLET | SUBLINGUAL | Status: DC | PRN
  Administered 2016-10-31: 0.4 mg via SUBLINGUAL

## 2016-10-31 MED ORDER — FENTANYL CITRATE (PF) 100 MCG/2ML IJ SOLN
INTRAMUSCULAR | Status: DC | PRN
Start: 2016-10-31 — End: 2016-10-31
  Administered 2016-10-31: 25 ug via INTRAVENOUS
  Administered 2016-10-31: 50 ug via INTRAVENOUS

## 2016-10-31 MED ORDER — MIDAZOLAM HCL 2 MG/2ML IJ SOLN
INTRAMUSCULAR | Status: AC
  Filled 2016-10-31: qty 2

## 2016-10-31 MED ORDER — HEPARIN BOLUS VIA INFUSION
2000.0000 [IU] | Freq: Once | INTRAVENOUS | Status: AC
  Administered 2016-10-31: 2000 [IU] via INTRAVENOUS
  Filled 2016-10-31: qty 2000

## 2016-10-31 MED ORDER — NITROGLYCERIN IN D5W 200-5 MCG/ML-% IV SOLN
0.0000 ug/min | INTRAVENOUS | Status: DC
Start: 2016-10-31 — End: 2016-11-03
  Administered 2016-10-31: 15 ug/min via INTRAVENOUS
  Administered 2016-11-02: 50 ug/min via INTRAVENOUS
  Filled 2016-10-31: qty 250

## 2016-10-31 MED ORDER — ONDANSETRON HCL 4 MG/2ML IJ SOLN
INTRAMUSCULAR | Status: DC | PRN
  Administered 2016-10-31: 4 mg via INTRAVENOUS

## 2016-10-31 MED ORDER — HEPARIN (PORCINE) IN NACL 2-0.9 UNIT/ML-% IJ SOLN
INTRAMUSCULAR | Status: AC
  Filled 2016-10-31: qty 1000

## 2016-10-31 MED ORDER — ISOSORBIDE MONONITRATE ER 30 MG PO TB24
30.0000 mg | ORAL_TABLET | Freq: Every day | ORAL | Status: DC
  Administered 2016-10-31: 30 mg via ORAL
  Filled 2016-10-31: qty 1

## 2016-10-31 MED ORDER — TIROFIBAN (AGGRASTAT) BOLUS VIA INFUSION
INTRAVENOUS | Status: DC | PRN
  Administered 2016-10-31: 1795 ug via INTRAVENOUS

## 2016-10-31 MED ORDER — ASPIRIN EC 81 MG PO TBEC
81.0000 mg | DELAYED_RELEASE_TABLET | Freq: Every day | ORAL | Status: DC
  Administered 2016-10-31 – 2016-11-02 (×3): 81 mg via ORAL
  Filled 2016-10-31 (×3): qty 1

## 2016-10-31 MED ORDER — MIDAZOLAM HCL 2 MG/2ML IJ SOLN
INTRAMUSCULAR | Status: DC | PRN
  Administered 2016-10-31 (×3): 1 mg via INTRAVENOUS

## 2016-10-31 MED ORDER — NITROGLYCERIN 1 MG/10 ML FOR IR/CATH LAB
INTRA_ARTERIAL | Status: AC
  Filled 2016-10-31: qty 10

## 2016-10-31 MED ORDER — LISDEXAMFETAMINE DIMESYLATE 20 MG PO CAPS
40.0000 mg | ORAL_CAPSULE | Freq: Every day | ORAL | Status: DC
  Administered 2016-10-31 – 2016-11-01 (×2): 40 mg via ORAL
  Filled 2016-10-31 (×2): qty 2

## 2016-10-31 MED ORDER — ONDANSETRON HCL 4 MG/2ML IJ SOLN: 4.0000 mg | Freq: Four times a day (QID) | INTRAMUSCULAR | Status: DC | PRN

## 2016-10-31 MED ORDER — IOPAMIDOL (ISOVUE-370) INJECTION 76%
INTRAVENOUS | Status: DC | PRN
  Administered 2016-10-31: 120 mL

## 2016-10-31 MED ORDER — NITROGLYCERIN 0.3 MG SL SUBL
SUBLINGUAL_TABLET | SUBLINGUAL | Status: DC | PRN
Start: 2016-10-31 — End: 2016-10-31
  Administered 2016-10-31: 0.4 mg via SUBLINGUAL

## 2016-10-31 MED ORDER — HEPARIN SODIUM (PORCINE) 1000 UNIT/ML IJ SOLN
INTRAMUSCULAR | Status: AC
  Filled 2016-10-31: qty 1

## 2016-10-31 MED ORDER — VERAPAMIL HCL 2.5 MG/ML IV SOLN
INTRAVENOUS | Status: AC
  Filled 2016-10-31: qty 2

## 2016-10-31 MED ORDER — ACETAMINOPHEN 325 MG PO TABS
650.0000 mg | ORAL_TABLET | ORAL | Status: DC | PRN
  Administered 2016-11-02: 650 mg via ORAL
  Filled 2016-10-31: qty 2

## 2016-10-31 MED ORDER — HEPARIN BOLUS VIA INFUSION
900.0000 [IU] | Freq: Once | INTRAVENOUS | Status: AC
  Administered 2016-10-31: 900 [IU] via INTRAVENOUS
  Filled 2016-10-31: qty 900

## 2016-10-31 MED ORDER — TIROFIBAN HCL IN NACL 5-0.9 MG/100ML-% IV SOLN
INTRAVENOUS | Status: AC
  Filled 2016-10-31: qty 100

## 2016-10-31 MED ORDER — FENTANYL CITRATE (PF) 100 MCG/2ML IJ SOLN
INTRAMUSCULAR | Status: AC
  Filled 2016-10-31: qty 2

## 2016-10-31 MED ORDER — SODIUM CHLORIDE 0.9 % IV SOLN: 250.0000 mL | INTRAVENOUS | Status: DC | PRN

## 2016-10-31 MED ORDER — SODIUM CHLORIDE 0.9% FLUSH
3.0000 mL | Freq: Two times a day (BID) | INTRAVENOUS | Status: DC
  Administered 2016-10-31 – 2016-11-01 (×3): 3 mL via INTRAVENOUS

## 2016-10-31 MED ORDER — NITROGLYCERIN 1 MG/10 ML FOR IR/CATH LAB
INTRA_ARTERIAL | Status: DC | PRN
  Administered 2016-10-31 (×2): 200 ug via INTRACORONARY

## 2016-10-31 MED ORDER — ALPRAZOLAM 0.5 MG PO TABS
0.5000 mg | ORAL_TABLET | Freq: Every day | ORAL | Status: DC
  Administered 2016-10-31 – 2016-11-02 (×3): 0.5 mg via ORAL
  Filled 2016-10-31 (×3): qty 1

## 2016-10-31 MED ORDER — HEPARIN SODIUM (PORCINE) 5000 UNIT/ML IJ SOLN
5000.0000 [IU] | Freq: Three times a day (TID) | INTRAMUSCULAR | Status: DC
  Administered 2016-11-01: 5000 [IU] via SUBCUTANEOUS
  Filled 2016-10-31: qty 1

## 2016-10-31 SURGICAL SUPPLY — 19 items
CATH INFINITI JR4 5F (CATHETERS) ×1 IMPLANT
CATH LAUNCHER 5F EBU3.0 (CATHETERS) IMPLANT
CATH OPTICROSS 40MHZ (CATHETERS) ×1 IMPLANT
CATH OPTITORQUE TIG 4.0 5F (CATHETERS) ×1 IMPLANT
CATHETER LAUNCHER 5F EBU3.0 (CATHETERS) ×2
DEVICE RAD COMP TR BAND LRG (VASCULAR PRODUCTS) ×1 IMPLANT
ELECT DEFIB PAD ADLT CADENCE (PAD) ×1 IMPLANT
GLIDESHEATH SLEND SS 6F .021 (SHEATH) ×1 IMPLANT
GUIDE CATH RUNWAY 6FR CLS3 (CATHETERS) ×1 IMPLANT
GUIDEWIRE INQWIRE 1.5J.035X260 (WIRE) IMPLANT
INQWIRE 1.5J .035X260CM (WIRE) ×2
KIT HEART LEFT (KITS) ×2 IMPLANT
PACK CARDIAC CATHETERIZATION (CUSTOM PROCEDURE TRAY) ×2 IMPLANT
SLED PULL BACK IVUS (MISCELLANEOUS) ×1 IMPLANT
TRANSDUCER W/STOPCOCK (MISCELLANEOUS) ×2 IMPLANT
TUBING CIL FLEX 10 FLL-RA (TUBING) ×2 IMPLANT
WIRE ASAHI PROWATER 180CM (WIRE) ×1 IMPLANT
WIRE COUGAR XT STRL 190CM (WIRE) ×1 IMPLANT
WIRE HI TORQ VERSACORE-J 145CM (WIRE) ×1 IMPLANT

## 2016-10-31 NOTE — ED Notes (Signed)
Pt c/o increasing CP; new ECG captured and given to Ward, EDP

## 2016-10-31 NOTE — Progress Notes (Signed)
Patient arrived to unit and began to experience severe chest pain radiating to both arms and jaw.  She did not experience any dyspnea.  Was given 1 nitro SL.  Though her pain was not at first completely resolved she refused a second dose.  Within minutes upon arrival of cardiologist, patient reported complete relief. Will continue to monitor.

## 2016-10-31 NOTE — Progress Notes (Signed)
ANTICOAGULATION CONSULT NOTE - Follow Up Consult  Pharmacy Consult for heparin Indication: chest pain/ACS  Labs:  Recent Labs  10/30/16 2000 10/31/16 0023 10/31/16 0215 10/31/16 0225  HGB 13.3  --   --  13.6  HCT 38.0  --   --  38.3  PLT 249  --   --  246  HEPARINUNFRC  --   --  0.20*  --   CREATININE 0.68  --   --   --   TROPONINI 0.03* 0.09*  --   --     Assessment: 40yo female subtherapeutic on heparin with initial dosing for CP.  Goal of Therapy:  Heparin level 0.3-0.7 units/ml   Plan:  Will rebolus with heparin 2000 units and increase gtt by 3 units/kg/hr to 1100 units/hr and check level in 6hr.  Christina GamblesVeronda Oreta Morrison, PharmD, BCPS  10/31/2016,3:10 AM

## 2016-10-31 NOTE — H&P (Signed)
History and Physical    Rhyse Skowron ZOX:[id number] DOB: [birth year] DOA: 10/30/2016  PCP: Pearson Grippe, MD  Patient coming from: Home.  Chief Complaint: Chest pain.  HPI: Christina Morrison is a 40 y.o. female with CAD status post stenting in April 2016 in the setting of cocaine abuse, hypertension presents to the ER because of chest pain. Patient states she has been having ongoing chest pain since last 3 days. Chest pain as pressure-like radiating to her neck present even at rest. She has tried multiple doses of sublingual nitroglycerin despite which patient's chest pain has been persistent. In the ER troponin is mildly elevated EKG shows nonspecific findings chest x-ray was unremarkable. On-call cardiologist was consulted by ER physician and patient is being admitted for further management. Patient denies any shortness of breath productive cough fever or chills. Patient states she has been compliant with her cardiac medications. Denies any recent use of cocaine. Tox screen is only positive for amphetamine.   ED Course: EKG was showing normal sinus rhythm with nonspecific changes troponin is mildly elevated chest x-ray was unremarkable.  Review of Systems: As per HPI, rest all negative.   Past Medical History:  Diagnosis Date  . Anxiety   . Bipolar 1 disorder (HCC)   . Chronic pain   . Cocaine use   . Coronary artery disease    a. 03/2015 NSTEMI/PCI in setting of cocaine use - BMS to diagonal. b. NSTEMI 12/2015 s/o overlapping DES to Cx, residual mild RCA and LAD disease, 75% OM1.  . Depression   . Dyslipidemia   . Migraine   . Osteoarthritis   . Polysubstance abuse    a. tobacco/cocaine  . Scoliosis   . TIA (transient ischemic attack)     Past Surgical History:  Procedure Laterality Date  . CARDIAC CATHETERIZATION  03/21/2015   Procedure: CORONARY STENT INTERVENTION;  Surgeon: [name], MD;  Location: [facility] CATH LAB;  Service: Cardiovascular;;  diag bms 2.75 x 12 vision  . CARDIAC  CATHETERIZATION N/A 01/14/2016   Procedure: Coronary Stent Intervention;  Surgeon: [name], MD;  Location: [facility] INVASIVE CV LAB;  Service: Cardiovascular;  Laterality: N/A;  . CORONARY STENT PLACEMENT  03/21/2015   first diagonal  . ESOPHAGOGASTRODUODENOSCOPY (EGD) WITH PROPOFOL N/A 06/11/2015   Procedure: ESOPHAGOGASTRODUODENOSCOPY (EGD) WITH PROPOFOL;  Surgeon: Bernette Redbird, MD;  Location: [facility] ENDOSCOPY;  Service: Endoscopy;  Laterality: N/A;  . LEFT HEART CATHETERIZATION WITH CORONARY ANGIOGRAM N/A 03/21/2015   Procedure: LEFT HEART CATHETERIZATION WITH CORONARY ANGIOGRAM;  Surgeon: [name], MD;  Location: [facility] CATH LAB;  Service: Cardiovascular;  Laterality: N/A;  . LEFT HEART CATHETERIZATION WITH CORONARY ANGIOGRAM N/A 04/11/2015   Procedure: LEFT HEART CATHETERIZATION WITH CORONARY ANGIOGRAM;  Surgeon: Kathleene Hazel, MD;  Location: [facility] CATH LAB;  Service: Cardiovascular;  Laterality: N/A;     reports that she has been smoking Cigarettes.  She has been smoking about 0.10 packs per day. She has never used smokeless tobacco. She reports that she does not drink alcohol or use drugs.  Allergies  Allergen Reactions  . Ranexa [Ranolazine] Nausea Only    Dizziness (only the 1,000 mg dose has this effect)    Family History  Problem Relation Age of Onset  . Mental retardation Mother   . Hypertension Mother   . Hyperlipidemia Mother   . Heart disease Mother   . Depression Mother   . Hypertension Father   . Hyperlipidemia Father   . Heart disease Father   . Mental  retardation Father   . Diabetes Father   . Cancer Maternal Aunt   . Stroke Maternal Grandmother     Prior to Admission medications   Medication Sig Start Date End Date Taking? Authorizing Provider  ALPRAZolam Prudy Feeler(XANAX) 0.5 MG tablet Take 0.5 mg by mouth daily.    Yes Historical Provider, MD  amLODipine (NORVASC) 5 MG tablet TAKE 1 TABLET BY MOUTH DAILY Patient taking differently: Take 5 mg by mouth once a day  07/21/16  Yes Tonny BollmanMichael Cooper, MD  aspirin EC 81 MG EC tablet Take 1 tablet (81 mg total) by mouth daily. 03/23/15  Yes Ok Anishristopher R Berge, NP  atorvastatin (LIPITOR) 40 MG tablet Take 1 tablet (40 mg total) by mouth daily. 03/13/16  Yes Leone BrandLaura R Ingold, NP  buPROPion (WELLBUTRIN XL) 300 MG 24 hr tablet Take 300 mg by mouth every morning. 10/20/16  Yes Historical Provider, MD  clopidogrel (PLAVIX) 75 MG tablet TAKE 1 TABLET BY MOUTH DAILY WITH BREAKFAST Patient taking differently: Take 75 mg by mouth once a day 07/21/16  Yes Tonny BollmanMichael Cooper, MD  doxepin (SINEQUAN) 10 MG capsule Take 10-20 mg by mouth at bedtime. 10/19/16  Yes Historical Provider, MD  escitalopram (LEXAPRO) 20 MG tablet Take 20 mg by mouth daily. 01/18/16  Yes Historical Provider, MD  fluticasone (FLONASE) 50 MCG/ACT nasal spray Place 2 sprays into both nostrils daily as needed for allergies or rhinitis.   Yes Historical Provider, MD  isosorbide mononitrate (IMDUR) 30 MG 24 hr tablet Take 3 tablets (90 mg total) by mouth daily. Patient taking differently: Take 30-60 mg by mouth See admin instructions. 60 mg in the morning and 30 mg at bedtime 06/18/16  Yes Tonny BollmanMichael Cooper, MD  nitroGLYCERIN (NITROSTAT) 0.4 MG SL tablet Place 1 tablet (0.4 mg total) under the tongue every 5 (five) minutes as needed for chest pain. 05/20/16  Yes Tonny BollmanMichael Cooper, MD  ondansetron (ZOFRAN-ODT) 4 MG disintegrating tablet Take 4 mg by mouth daily as needed for nausea or vomiting. DISSOLVE 10/19/16  Yes Historical Provider, MD  Oxycodone HCl 10 MG TABS Take 10 mg by mouth 3 (three) times daily as needed (for pain).    Yes Historical Provider, MD  pantoprazole (PROTONIX) 40 MG tablet TAKE 1 TABLET BY MOUTH TWICE A DAY BEFORE MEALS Patient taking differently: Take 40 mg by mouth two times a day before meals 07/21/16  Yes Tonny BollmanMichael Cooper, MD  ranolazine (RANEXA) 500 MG 12 hr tablet Take 1 tablet (500 mg total) by mouth 2 (two) times daily. 09/24/16  Yes Tonny BollmanMichael Cooper, MD  VYVANSE 40  MG capsule Take 40 mg by mouth every morning. 10/28/16  Yes Historical Provider, MD  acetaminophen (TYLENOL) 325 MG tablet Take 2 tablets (650 mg total) by mouth every 4 (four) hours as needed for headache or mild pain. Patient not taking: Reported on 10/30/2016 01/15/16   Abelino DerrickLuke K Kilroy, PA-C  buPROPion [facility](WELLBUTRIN SR) 150 MG 12 hr tablet Take 1-2 tablets (150-300 mg total) by mouth 2 (two) times daily. Takes 300mg  in am and 150mg  in pm Patient not taking: Reported on 10/30/2016 06/11/15   Joseph ArtJessica U Vann, DO    Physical Exam: Vitals:   10/30/16 2239 10/30/16 2245 10/30/16 2301 10/30/16 2343  BP: 105/68 105/68  136/90  Pulse: 75 73  85  Resp: 23 20  25   Temp:      TempSrc:      SpO2: 98% 96%  100%  Weight:   61.2 kg (135 lb)   Height:  5\' 2"  (1.575 m)       Constitutional: Moderately built and nourished. Vitals:   10/30/16 2239 10/30/16 2245 10/30/16 2301 10/30/16 2343  BP: 105/68 105/68  136/90  Pulse: 75 73  85  Resp: 23 20  25   Temp:      TempSrc:      SpO2: 98% 96%  100%  Weight:   61.2 kg (135 lb)   Height:   5\' 2"  (1.575 m)    Eyes: Anicteric no pallor. ENMT: No discharge from the ears eyes nose and mouth. Neck: No mass felt. No JVD appreciated. Respiratory: No rhonchi or crepitations. Cardiovascular: S1 and S2 heard. No murmurs appreciated. Abdomen: Soft nontender bowel sounds present. No guarding or rigidity. Musculoskeletal: No edema. No joint effusion. Skin: No rash. Neurologic: Alert awake oriented to time place and person. Moves all extremities. Psychiatric: Appears normal. Normal affect.   Labs on Admission: I have personally reviewed following labs and imaging studies  CBC:  Recent Labs Lab 10/30/16 2000  WBC 5.8  NEUTROABS 3.9  HGB 13.3  HCT 38.0  MCV 88.2  PLT 249   Basic Metabolic Panel:  Recent Labs Lab 10/30/16 2000  NA 137  K 3.5  CL 105  CO2 24  GLUCOSE 110*  BUN 6  CREATININE 0.68  CALCIUM 9.1   GFR: Estimated Creatinine  Clearance: 80.4 mL/min (by C-G formula based on SCr of 0.68 mg/dL). Liver Function Tests:  Recent Labs Lab 10/30/16 2000  AST 19  ALT 15  ALKPHOS 106  BILITOT 0.7  PROT 6.8  ALBUMIN 4.1   No results for input(s): LIPASE, AMYLASE in the last 168 hours. No results for input(s): AMMONIA in the last 168 hours. Coagulation Profile: No results for input(s): INR, PROTIME in the last 168 hours. Cardiac Enzymes:  Recent Labs Lab 10/30/16 2000  TROPONINI 0.03*   BNP (last 3 results) No results for input(s): PROBNP in the last 8760 hours. HbA1C: No results for input(s): HGBA1C in the last 72 hours. CBG: No results for input(s): GLUCAP in the last 168 hours. Lipid Profile: No results for input(s): CHOL, HDL, LDLCALC, TRIG, CHOLHDL, LDLDIRECT in the last 72 hours. Thyroid Function Tests: No results for input(s): TSH, T4TOTAL, FREET4, T3FREE, THYROIDAB in the last 72 hours. Anemia Panel: No results for input(s): VITAMINB12, FOLATE, FERRITIN, TIBC, IRON, RETICCTPCT in the last 72 hours. Urine analysis:    Component Value Date/Time   COLORURINE AMBER (A) 04/11/2015 1030   APPEARANCEUR CLOUDY (A) 04/11/2015 1030   LABSPEC 1.023 04/11/2015 1030   PHURINE 5.5 04/11/2015 1030   GLUCOSEU NEGATIVE 04/11/2015 1030   HGBUR SMALL (A) 04/11/2015 1030   BILIRUBINUR NEGATIVE 04/11/2015 1030   KETONESUR NEGATIVE 04/11/2015 1030   PROTEINUR NEGATIVE 04/11/2015 1030   UROBILINOGEN 0.2 04/11/2015 1030   NITRITE POSITIVE (A) 04/11/2015 1030   LEUKOCYTESUR LARGE (A) 04/11/2015 1030   Sepsis Labs: @LABRCNTIP (procalcitonin:4,lacticidven:4) )No results found for this or any previous visit (from the past 240 hour(s)).   Radiological Exams on Admission: Dg Chest 2 View  Result Date: 10/30/2016 CLINICAL DATA:  Chest pain and dyspnea x3 days with pain radiating to the neck and jaw. EXAM: CHEST  2 VIEW COMPARISON:  01/12/2016 FINDINGS: The heart size and mediastinal contours are within normal  limits. Both lungs are clear. The visualized skeletal structures are unremarkable. IMPRESSION: No active cardiopulmonary disease. Electronically Signed   By: Tollie Ethavid  Kwon M.D.   On: 10/30/2016 19:48    EKG: Independently reviewed. Normal sinus  rhythm.  Assessment/Plan Principal Problem:   Tobacco use disorder Active Problems:   CAD S/P percutaneous coronary angioplasty   Dyslipidemia   Chest pain   Hypertension    1. Chest pain concerning for unstable angina - I discussed with cardiologist who will be seeing patient in consult. Patient will be kept nothing by mouth in anticipation of possible cardiac procedure. Cycle cardiac markers. Patient is on aspirin and Plavix statins and Imdur. Patient has been started on heparin infusion. Patient does not want nitroglycerin infusion due to fear of triggering her migraine.Not on beta blockers due to previous history of cocaine abuse. 2. Hypertension on Norvasc. 3. Tobacco abuse - tobacco cessation counseling requested.   DVT prophylaxis: Lovenox. Code Status: Full code.  Family Communication: Discussed with patient.  Disposition Plan: Home.  Consults called: Cardiology.  Admission status: Observation.    Eduard Clos MD [facility] Pager [phone].  If 7PM-7AM, please contact night-coverage [email or web address] Password [email or web address]  10/31/2016, 12:16 AM

## 2016-10-31 NOTE — Consult Note (Signed)
Cardiology Consult    Patient ID: Christina Morrison MRN: [id number], DOB/AGE: March 20, 1976   Admit date: 10/30/2016 Date of Consult: 10/31/2016  Primary Physician: Pearson Grippe, MD Reason for Consult: Chest Pain Primary Cardiologist: [name], MD Requesting Provider: Midge Minium, MD   History of Present Illness    Christina Morrison is a 40 year old female with known coronary artery disease status post multiple percutaneous interventions (BMS to diagonal, DES to Cx, 75% Om1 without intervention), essential hypertension, dyslipidemia, tobacco abuse, and polysubstance use who is here for chest pain.  Christina Morrison states for the past 3 days she has been having chest pain located in the center of her chest that she describes as a squeezing pain.  Pain only last 1-3 minutes, 10/10 in intensity, and now occurring every hour.  The pain does go to her jaw, but she does state achiness throughout her entire body as well.  She first started noticing this pain at night but yesterday she started having it throughout the day.  Often associated with her symptoms are nausea, shortness of breath, and sweating.  She does sometimes have the pain when she walks.  She denies any swelling in her legs, problems with laying flat due to shortness of breath, fainting, or feeling as if she is going to faint.  At this time she is chest pain free.    Past Medical History   Past Medical History:  Diagnosis Date  . Anxiety   . Bipolar 1 disorder (HCC)   . Chronic pain   . Cocaine use   . Coronary artery disease    a. 03/2015 NSTEMI/PCI in setting of cocaine use - BMS to diagonal. b. NSTEMI 12/2015 s/o overlapping DES to Cx, residual mild RCA and LAD disease, 75% OM1.  . Depression   . Dyslipidemia   . Migraine   . Osteoarthritis   . Polysubstance abuse    a. tobacco/cocaine  . Scoliosis   . TIA (transient ischemic attack)     Past Surgical History:  Procedure Laterality Date  . CARDIAC  CATHETERIZATION  03/21/2015   Procedure: CORONARY STENT INTERVENTION;  Surgeon: [name], MD;  Location: [facility] CATH LAB;  Service: Cardiovascular;;  diag bms 2.75 x 12 vision  . CARDIAC CATHETERIZATION N/A 01/14/2016   Procedure: Coronary Stent Intervention;  Surgeon: [name], MD;  Location: [facility] INVASIVE CV LAB;  Service: Cardiovascular;  Laterality: N/A;  . CORONARY STENT PLACEMENT  03/21/2015   first diagonal  . ESOPHAGOGASTRODUODENOSCOPY (EGD) WITH PROPOFOL N/A 06/11/2015   Procedure: ESOPHAGOGASTRODUODENOSCOPY (EGD) WITH PROPOFOL;  Surgeon: Bernette Redbird, MD;  Location: [facility] ENDOSCOPY;  Service: Endoscopy;  Laterality: N/A;  . LEFT HEART CATHETERIZATION WITH CORONARY ANGIOGRAM N/A 03/21/2015   Procedure: LEFT HEART CATHETERIZATION WITH CORONARY ANGIOGRAM;  Surgeon: [name], MD;  Location: [facility] CATH LAB;  Service: Cardiovascular;  Laterality: N/A;  . LEFT HEART CATHETERIZATION WITH CORONARY ANGIOGRAM N/A 04/11/2015   Procedure: LEFT HEART CATHETERIZATION WITH CORONARY ANGIOGRAM;  Surgeon: Kathleene Hazel, MD;  Location: [facility] CATH LAB;  Service: Cardiovascular;  Laterality: N/A;     Allergies  Allergies  Allergen Reactions  . Ranexa [Ranolazine] Nausea Only    Dizziness (only the 1,000 mg dose has this effect)    Inpatient Medications    . ALPRAZolam  0.5 mg Oral Daily  . amLODipine  5 mg Oral Daily  . aspirin EC  81 mg Oral Daily  . atorvastatin  40 mg Oral Daily  . buPROPion  300 mg Oral Daily  . clopidogrel  75 mg Oral Q breakfast  . doxepin  10-20 mg Oral QHS  . escitalopram  20 mg Oral Daily  . isosorbide mononitrate  60 mg Oral [id number]   And  . isosorbide mononitrate  30 mg Oral QHS  . lisdexamfetamine  40 mg Oral Daily  . nitroGLYCERIN      . pantoprazole  40 mg Oral Daily  . ranolazine  500 mg Oral BID    Family History    Family History  Problem Relation Age of Onset  . Mental retardation Mother   . Hypertension Mother   . Hyperlipidemia Mother   .  Heart disease Mother   . Depression Mother   . Hypertension Father   . Hyperlipidemia Father   . Heart disease Father   . Mental retardation Father   . Diabetes Father   . Cancer Maternal Aunt   . Stroke Maternal Grandmother     Social History    Social History   Social History  . Marital status: Divorced    Spouse name: N/A  . Number of children: N/A  . Years of education: N/A   Occupational History  . Not on file.   Social History Main Topics  . Smoking status: Current Every Day Smoker    Packs/day: 0.10    Types: Cigarettes  . Smokeless tobacco: Never Used  . Alcohol use No  . Drug use: No     Comment: Prior cocaine use  . Sexual activity: Not on file   Other Topics Concern  . Not on file   Social History Narrative  . No narrative on file     Review of Systems    All other systems reviewed and are otherwise negative except as noted above.  Physical Exam    Blood pressure 104/68, pulse 71, temperature 98.1 F (36.7 C), temperature source Oral, resp. rate 18, height 5\' 2"  (1.575 m), weight 61.2 kg (135 lb), last menstrual period 10/27/2016, SpO2 97 %.  General: Pleasant, NAD Psych: Normal affect. Neuro: Alert and oriented X 3. Moves all extremities spontaneously. HEENT: Normal  Neck: Supple without bruits or JVD. Lungs:  Resp regular and unlabored, CTA. Heart: RRR no s3, s4, or murmurs. Abdomen: Soft, non-tender, non-distended, BS + x 4.  Extremities: No clubbing, cyanosis or edema. DP/PT/Radials 2+ and equal bilaterally.  Labs    Troponin (Point of Care Test) No results for input(s): TROPIPOC in the last 72 hours.  Recent Labs  10/30/16 2000 10/31/16 0023  TROPONINI 0.03* 0.09*   Lab Results  Component Value Date   WBC 5.2 10/31/2016   HGB 13.6 10/31/2016   HCT 38.3 10/31/2016   MCV 88.2 10/31/2016   PLT 246 10/31/2016    Recent Labs Lab 10/30/16 2000  NA 137  K 3.5  CL 105  CO2 24  BUN 6  CREATININE 0.68  CALCIUM 9.1  PROT 6.8   BILITOT 0.7  ALKPHOS 106  ALT 15  AST 19  GLUCOSE 110*   Lab Results  Component Value Date   CHOL 185 03/24/2016   HDL 35 (L) 03/24/2016   LDLCALC 122 03/24/2016   TRIG 141 03/24/2016   Lab Results  Component Value Date   DDIMER <0.27 03/20/2015     Radiology Studies    Dg Chest 2 View  Result Date: 10/30/2016 CLINICAL DATA:  Chest pain and dyspnea x3 days with pain radiating to the neck and jaw. EXAM: CHEST  2  VIEW COMPARISON:  01/12/2016 FINDINGS: The heart size and mediastinal contours are within normal limits. Both lungs are clear. The visualized skeletal structures are unremarkable. IMPRESSION: No active cardiopulmonary disease. Electronically Signed   By: Tollie Ethavid  Kwon M.D.   On: 10/30/2016 19:48    EKG & Cardiac Imaging    EKG: 11.17.17 (02:00 hrs):  NSR, normal ECG  Echocardiogram:  04/12/15:  - Left ventricle: The cavity size was normal. Wall thickness was   normal. Systolic function was normal. The estimated ejection   fraction was in the range of 60% to 65%. Wall motion was normal;   there were no regional wall motion abnormalities. - Aortic valve: There was trivial regurgitation. - Mitral valve: There was trivial regurgitation. - Atrial septum: No defect or patent foramen ovale was identified. - Tricuspid valve: There was trivial regurgitation.  Cardiac catheterization: 01/14/16:  1. Severe diffuse left circumflex stenosis with successful PCI using overlapping DES extending from the ostium through the mid-vessel 2. Mild nonobstructive LAD and RCA stenosis 3. Continued patency of the stented segment in the first diagonal  Non-invasive testing:  Nuclear stress EF: 61%.  The left ventricular ejection fraction is normal (55-65%).  There was no ST segment deviation noted during stress.  The study is normal.  This is a low risk study.   Assessment & Plan    # Chest pain: Patient with ongoing chest pain with features not typical of angina but upward  trending troponin level.  Drug screen is positive for amphetamines, and this can cause both myocardial ischemia and potentially myocarditis.  Patient could be suffering from NSTEM-ACS, but must consider other factors.  ECG is without any signs of ischemia or infarction.  Troponin level continues to trend upward.  Patient continues to have chest pain despite appropriate measures.  She is currently on heparin drip and dual antiplatelet therapy. -  Continue heparin drip and dual antiplatelet therapy at this time.   - Will discuss potential left heart catheterization in the morning. If chest pain does not resolve with additional measures, will discuss doing cardiac catheterization urgently.    - Make NPO at this time.  # Coronary artery disease: Patient with known premature coronary artery disease in the setting of prior cocaine abuse.  She is here now with chest pain that is potentially related to her underlying coronary artery disease.  Lipid panel prior does not suggest a familial dyslipidemia.  She is not on a beta-blocker but was taking other guideline directed medical therapy prior. - Continue home Plavix, aspirin, and Lipitor  # Hypertension: Patient with a known history of essential hypertension without any overt end-organ damage.  Blood pressure at this time is in normal range. - Continue home blood pressure medications.  # Tobacco abuse: Counsel on smoking cessation.    Signed, Judie GrieveKamal D Casmere Hollenbeck, MD 10/31/2016, 2:47 AM

## 2016-10-31 NOTE — Progress Notes (Signed)
ANTICOAGULATION CONSULT NOTE - Follow Up Consult  Pharmacy Consult to switch from IV heparin to Tirofiban infusion Indication: chest pain/ACS  Allergies  Allergen Reactions  . Ranexa [Ranolazine] Nausea Only    Dizziness (only the 1,000 mg dose has this effect)    Patient Measurements: Height: 5\' 2"  (157.5 cm) Weight: 158 lb 4.6 oz (71.8 kg) IBW/kg (Calculated) : 50.1 Heparin Dosing Weight: 65 kg  Vital Signs: Temp: 98.7 F (37.1 C) (11/17 1700) Temp Source: Oral (11/17 1700) BP: 109/77 (11/17 1943) Pulse Rate: 72 (11/17 1943)  Labs:  Recent Labs  10/30/16 2000 10/31/16 0023 10/31/16 0215 10/31/16 0225 10/31/16 1036  HGB 13.3  --   --  13.6  --   HCT 38.0  --   --  38.3  --   PLT 249  --   --  246  --   HEPARINUNFRC  --   --  0.20*  --  0.25*  CREATININE 0.68  --   --   --   --   TROPONINI 0.03* 0.09*  --  0.09* 0.06*    Estimated Creatinine Clearance: 86.8 mL/min (by C-G formula based on SCr of 0.68 mg/dL).  Assessment: 40 yo f presenting with CP, on IV heparin infusion for r/o ACS.  Now s/p cardiac cath, revealed significant 2-vessel CAD.  Initiated Tirofiban bolus & infusion  in the cath lab Pharmacy consulted for tirofiban infusion; continue indefinitely pending decision of CABG vs. PCI.  PMH: CAD, HTN, Hx of cocaine   Goal of Therapy:  Monitor platelets by anticoagulation protocol: Yes   Plan:  Tirofiban infusion 0.15 mcg/kg/min  Monitor PLTC daily  Thank you for allowing pharmacy to be part of this patients care team. Noah Delaineuth Michiko Lineman, RPh Clinical Pharmacist Pager: [phone] 10/31/2016 9:13 PM

## 2016-10-31 NOTE — Progress Notes (Signed)
ANTICOAGULATION CONSULT NOTE - Follow Up Consult  Pharmacy Consult for heparin Indication: chest pain/ACS  Allergies  Allergen Reactions  . Ranexa [Ranolazine] Nausea Only    Dizziness (only the 1,000 mg dose has this effect)    Patient Measurements: Height: 5\' 2"  (157.5 cm) Weight: 158 lb 4.6 oz (71.8 kg) IBW/kg (Calculated) : 50.1 Heparin Dosing Weight: 65 kg  Vital Signs: Temp: 98.6 F (37 C) (11/17 0822) Temp Source: Oral (11/17 0822) BP: 96/64 (11/17 0822) Pulse Rate: 80 (11/17 0822)  Labs:  Recent Labs  10/30/16 2000 10/31/16 0023 10/31/16 0215 10/31/16 0225 10/31/16 1036  HGB 13.3  --   --  13.6  --   HCT 38.0  --   --  38.3  --   PLT 249  --   --  246  --   HEPARINUNFRC  --   --  0.20*  --  0.25*  CREATININE 0.68  --   --   --   --   TROPONINI 0.03* 0.09*  --  0.09*  --     Estimated Creatinine Clearance: 86.8 mL/min (by C-G formula based on SCr of 0.68 mg/dL).  Assessment: 40 yo f presenting with CP.   PMH: CAD, HTN, Hx of cocaine  Anticoag: none pta - heparin for r/o ACS. Hep 1100 units/hr with lvl of 0.25  Nephro: SCr 0.68  Heme/Onc: H&H 13.6/38.3, Plt 246  Goal of Therapy:  Heparin level 0.3-0.7 units/ml Monitor platelets by anticoagulation protocol: Yes   Plan:  Heparin 900 unit IV bolus Heparin infusion 1200 units/hr 1800 HL Daily HL and CBC Pending Cath  Isaac BlissMichael Saadiya Wilfong, PharmD, BCPS, [facility] Hospital[facility]CCCP Clinical Pharmacist Pager [phone] 10/31/2016 11:18 AM

## 2016-10-31 NOTE — Progress Notes (Signed)
Offered PT a bath. Pt stated she would like to wait until after she gets her pain med. Tech will retry.

## 2016-10-31 NOTE — H&P (View-Only) (Signed)
     SUBJECTIVE: No chest pain at this time. Severe pain last night.   Tele: sinus  BP 96/64 (BP Location: Left Arm)   Pulse 80   Temp 98.6 F (37 C) (Oral)   Resp (!) 24   Ht 5' 2" (1.575 m)   Wt 158 lb 4.6 oz (71.8 kg)   LMP 10/27/2016 (Exact Date)   SpO2 96%   BMI 28.95 kg/m   Intake/Output Summary (Last 24 hours) at 10/31/16 1025 Last data filed at 10/31/16 0800  Gross per 24 hour  Intake               44 ml  Output                0 ml  Net               44 ml    PHYSICAL EXAM General: Well developed, well nourished, in no acute distress. Alert and oriented x 3.  Psych:  Good affect, responds appropriately Neck: No JVD. No masses noted.  Lungs: Clear bilaterally with no wheezes or rhonci noted.  Heart: RRR with no murmurs noted. Abdomen: Bowel sounds are present. Soft, non-tender.  Extremities: No lower extremity edema.   LABS: Basic Metabolic Panel:  Recent Labs  10/30/16 2000  NA 137  K 3.5  CL 105  CO2 24  GLUCOSE 110*  BUN 6  CREATININE 0.68  CALCIUM 9.1   CBC:  Recent Labs  10/30/16 2000 10/31/16 0225  WBC 5.8 5.2  NEUTROABS 3.9  --   HGB 13.3 13.6  HCT 38.0 38.3  MCV 88.2 88.2  PLT 249 246   Cardiac Enzymes:  Recent Labs  10/30/16 2000 10/31/16 0023 10/31/16 0225  TROPONINI 0.03* 0.09* 0.09*    Current Meds: . ALPRAZolam  0.5 mg Oral Daily  . amLODipine  5 mg Oral Daily  . aspirin EC  81 mg Oral Daily  . atorvastatin  40 mg Oral Daily  . buPROPion  300 mg Oral Daily  . clopidogrel  75 mg Oral Q breakfast  . doxepin  10-20 mg Oral QHS  . escitalopram  20 mg Oral Daily  . Influenza vac split quadrivalent PF  0.5 mL Intramuscular Tomorrow-1000  . isosorbide mononitrate  60 mg Oral BH-q7a   And  . isosorbide mononitrate  30 mg Oral QHS  . lisdexamfetamine  40 mg Oral Daily  . pantoprazole  40 mg Oral Daily  . ranolazine  500 mg Oral BID     ASSESSMENT AND PLAN:  1. CAD/NSTEMI: She is known to have severe CAD with  prior PCI/stenting. Last cath in  January 2017 with overlapping DES in the Circumflex. Now admitted with chest pain. Classic for unstable angina. Troponin mildly elevated. She is on ASA, Plavix, statin, Imdur Plan cardiac cath today. She is NPO. Risks and benefits reviewed with pt.   Sheyla Zaffino  11/17/201710:25 AM  

## 2016-10-31 NOTE — Interval H&P Note (Signed)
History and Physical Interval Note:  10/31/2016 6:19 PM  Christina Morrison  has presented today for cardiac catheterization, with the diagnosis of chest pain. The various methods of treatment have been discussed with the patient and family. After consideration of risks, benefits and other options for treatment, the patient has consented to  Procedure(s): Left Heart Cath and Coronary Angiography (N/A) as a surgical intervention .  The patient's history has been reviewed, patient examined, no change in status, stable for surgery.  I have reviewed the patient's chart and labs.  Questions were answered to the patient's satisfaction.    Cath Lab Visit (complete for each Cath Lab visit)  Clinical Evaluation Leading to the Procedure:   ACS: Yes.    Non-ACS: N/A  [name]

## 2016-10-31 NOTE — Progress Notes (Signed)
     SUBJECTIVE: No chest pain at this time. Severe pain last night.   Tele: sinus  BP 96/64 (BP Location: Left Arm)   Pulse 80   Temp 98.6 F (37 C) (Oral)   Resp (!) 24   Ht 5\' 2"  (1.575 m)   Wt 158 lb 4.6 oz (71.8 kg)   LMP 10/27/2016 (Exact Date)   SpO2 96%   BMI 28.95 kg/m   Intake/Output Summary (Last 24 hours) at 10/31/16 1025 Last data filed at 10/31/16 0800  Gross per 24 hour  Intake               44 ml  Output                0 ml  Net               44 ml    PHYSICAL EXAM General: Well developed, well nourished, in no acute distress. Alert and oriented x 3.  Psych:  Good affect, responds appropriately Neck: No JVD. No masses noted.  Lungs: Clear bilaterally with no wheezes or rhonci noted.  Heart: RRR with no murmurs noted. Abdomen: Bowel sounds are present. Soft, non-tender.  Extremities: No lower extremity edema.   LABS: Basic Metabolic Panel:  Recent Labs  16/09/9610/16/17 2000  NA 137  K 3.5  CL 105  CO2 24  GLUCOSE 110*  BUN 6  CREATININE 0.68  CALCIUM 9.1   CBC:  Recent Labs  10/30/16 2000 10/31/16 0225  WBC 5.8 5.2  NEUTROABS 3.9  --   HGB 13.3 13.6  HCT 38.0 38.3  MCV 88.2 88.2  PLT 249 246   Cardiac Enzymes:  Recent Labs  10/30/16 2000 10/31/16 0023 10/31/16 0225  TROPONINI 0.03* 0.09* 0.09*    Current Meds: . ALPRAZolam  0.5 mg Oral Daily  . amLODipine  5 mg Oral Daily  . aspirin EC  81 mg Oral Daily  . atorvastatin  40 mg Oral Daily  . buPROPion  300 mg Oral Daily  . clopidogrel  75 mg Oral Q breakfast  . doxepin  10-20 mg Oral QHS  . escitalopram  20 mg Oral Daily  . Influenza vac split quadrivalent PF  0.5 mL Intramuscular Tomorrow-1000  . isosorbide mononitrate  60 mg Oral BH-q7a   And  . isosorbide mononitrate  30 mg Oral QHS  . lisdexamfetamine  40 mg Oral Daily  . pantoprazole  40 mg Oral Daily  . ranolazine  500 mg Oral BID     ASSESSMENT AND PLAN:  1. CAD/NSTEMI: She is known to have severe CAD with  prior PCI/stenting. Last cath in  January 2017 with overlapping DES in the Circumflex. Now admitted with chest pain. Classic for unstable angina. Troponin mildly elevated. She is on ASA, Plavix, statin, Imdur Plan cardiac cath today. She is NPO. Risks and benefits reviewed with pt.   Brystol Wasilewski  11/17/201710:25 AM

## 2016-10-31 NOTE — ED Notes (Signed)
Pt asking for pain meds, updated on POC for morphine q2hours and other meds coming from pharmacy. Hospital bed ordered as well.

## 2016-10-31 NOTE — Progress Notes (Signed)
Consent obtained from patient and she is knowledgeable about this procedure.  She stated that she had 3 stents in the past.  Mother at bedside.  Patient is off the unit for cardiac cath.

## 2016-11-01 DIAGNOSIS — I200 Unstable angina: Secondary | ICD-10-CM

## 2016-11-01 DIAGNOSIS — F329 Major depressive disorder, single episode, unspecified: Secondary | ICD-10-CM

## 2016-11-01 DIAGNOSIS — F32A Depression, unspecified: Secondary | ICD-10-CM | POA: Diagnosis present

## 2016-11-01 DIAGNOSIS — F17200 Nicotine dependence, unspecified, uncomplicated: Secondary | ICD-10-CM

## 2016-11-01 DIAGNOSIS — I25110 Atherosclerotic heart disease of native coronary artery with unstable angina pectoris: Secondary | ICD-10-CM

## 2016-11-01 LAB — BASIC METABOLIC PANEL
ANION GAP: 7 (ref 5–15)
BUN: 5 mg/dL — ABNORMAL LOW (ref 6–20)
CALCIUM: 8.7 mg/dL — AB (ref 8.9–10.3)
CHLORIDE: 108 mmol/L (ref 101–111)
CO2: 26 mmol/L (ref 22–32)
Creatinine, Ser: 0.67 mg/dL (ref 0.44–1.00)
GFR calc non Af Amer: >60 mL/min (ref >60)
GLUCOSE: 103 mg/dL — AB (ref 65–99)
POTASSIUM: 3.8 mmol/L (ref 3.5–5.1)
Sodium: 141 mmol/L (ref 135–145)

## 2016-11-01 LAB — HEPARIN LEVEL (UNFRACTIONATED): Heparin Unfractionated: 0.14 IU/mL — ABNORMAL LOW (ref 0.30–0.70)

## 2016-11-01 LAB — URINALYSIS, ROUTINE W REFLEX MICROSCOPIC
Bilirubin Urine: NEGATIVE
Glucose, UA: NEGATIVE mg/dL
Ketones, ur: NEGATIVE mg/dL
NITRITE: NEGATIVE
Protein, ur: NEGATIVE mg/dL
SPECIFIC GRAVITY, URINE: 1.005 (ref 1.005–1.030)
pH: 6 (ref 5.0–8.0)

## 2016-11-01 LAB — CBC
HEMATOCRIT: 35.9 % — AB (ref 36.0–46.0)
Hemoglobin: 12.2 g/dL (ref 12.0–15.0)
MCH: 30.3 pg (ref 26.0–34.0)
MCHC: 34 g/dL (ref 30.0–36.0)
MCV: 89.1 fL (ref 78.0–100.0)
PLATELETS: 229 10*3/uL (ref 150–400)
RBC: 4.03 MIL/uL (ref 3.87–5.11)
RDW: 12.3 % (ref 11.5–15.5)
WBC: 4.1 10*3/uL (ref 4.0–10.5)

## 2016-11-01 LAB — URINE MICROSCOPIC-ADD ON

## 2016-11-01 LAB — TROPONIN I: Troponin I: 0.04 ng/mL (ref <0.03)

## 2016-11-01 MED ORDER — LORAZEPAM 2 MG/ML IJ SOLN
INTRAMUSCULAR | Status: AC
  Filled 2016-11-01: qty 1

## 2016-11-01 MED ORDER — TIROFIBAN HCL IN NACL 5-0.9 MG/100ML-% IV SOLN
0.1500 ug/kg/min | INTRAVENOUS | Status: DC
  Administered 2016-11-01 – 2016-11-02 (×5): 0.15 ug/kg/min via INTRAVENOUS
  Filled 2016-11-01 (×5): qty 100

## 2016-11-01 MED ORDER — ASPIRIN 81 MG PO CHEW: 81.0000 mg | CHEWABLE_TABLET | ORAL | Status: DC

## 2016-11-01 MED ORDER — LORAZEPAM 2 MG/ML IJ SOLN
2.0000 mg | INTRAMUSCULAR | Status: DC | PRN
  Administered 2016-11-01: 2 mg via INTRAVENOUS

## 2016-11-01 MED ORDER — SODIUM CHLORIDE 0.9% FLUSH
3.0000 mL | Freq: Two times a day (BID) | INTRAVENOUS | Status: DC
  Administered 2016-11-01 – 2016-11-02 (×4): 3 mL via INTRAVENOUS

## 2016-11-01 MED ORDER — SODIUM CHLORIDE 0.9 % IV SOLN: 250.0000 mL | INTRAVENOUS | Status: DC | PRN

## 2016-11-01 MED ORDER — AMLODIPINE BESYLATE 2.5 MG PO TABS: 2.5000 mg | ORAL_TABLET | Freq: Every day | ORAL | Status: DC

## 2016-11-01 MED ORDER — LORAZEPAM 2 MG/ML IJ SOLN
1.0000 mg | Freq: Four times a day (QID) | INTRAMUSCULAR | Status: DC | PRN
  Administered 2016-11-01 – 2016-11-02 (×3): 2 mg via INTRAVENOUS
  Filled 2016-11-01 (×4): qty 1

## 2016-11-01 MED ORDER — MORPHINE SULFATE (PF) 4 MG/ML IV SOLN
4.0000 mg | INTRAVENOUS | Status: DC | PRN
  Administered 2016-11-01 – 2016-11-02 (×7): 4 mg via INTRAVENOUS
  Filled 2016-11-01 (×7): qty 1

## 2016-11-01 MED ORDER — MAGNESIUM HYDROXIDE 400 MG/5ML PO SUSP
30.0000 mL | Freq: Every day | ORAL | Status: DC | PRN
  Administered 2016-11-01: 30 mL via ORAL
  Filled 2016-11-01: qty 30

## 2016-11-01 MED ORDER — SODIUM CHLORIDE 0.9 % WEIGHT BASED INFUSION: 1.0000 mL/kg/h | INTRAVENOUS | Status: DC

## 2016-11-01 MED ORDER — SODIUM CHLORIDE 0.9% FLUSH: 3.0000 mL | INTRAVENOUS | Status: DC | PRN

## 2016-11-01 MED ORDER — SODIUM CHLORIDE 0.9 % WEIGHT BASED INFUSION: 3.0000 mL/kg/h | INTRAVENOUS | Status: AC

## 2016-11-01 MED ORDER — HEPARIN (PORCINE) IN NACL 100-0.45 UNIT/ML-% IJ SOLN
1300.0000 [IU]/h | INTRAMUSCULAR | Status: DC
  Administered 2016-11-01: 900 [IU]/h via INTRAVENOUS
  Administered 2016-11-02: 1300 [IU]/h via INTRAVENOUS
  Filled 2016-11-01: qty 250

## 2016-11-01 NOTE — Progress Notes (Signed)
PROGRESS NOTE    Christina Morrison Clippinger  ZOX:[id number] DOB: [birth year] DOA: 10/30/2016 PCP: Pearson GrippeJames Kim, MD    Brief Narrative: Christina Morrison Brazeau is a 40 y.o.  female with CAD status post stenting in April 2016 in the setting of cocaine abuse and hypertension. She presented with chest pain and found to have an NSTEMI. She is s/p cath and plan going forward is for CABG.   Assessment & Plan:   Principal Problem:   Unstable angina (HCC) Active Problems:   Tobacco use disorder   CAD S/P percutaneous coronary angioplasty   Dyslipidemia   Chest pain   Hypertension   NSTEMI CAD Patient is s/p cath with multivessel disease on 11/18, continued chest pain and t-wave inversions. -cardiology recommendations: CABG, continue nitro gtt, continue heparin gtt. TCTS consulted -morphine prn -continue atorvastatin  Hypertension On nitro gtt. Blood pressure on low side today -continue amlodipine  Tobacco abuse Drug abuse Cessation  Depressiion -continue Lexapro    DVT prophylaxis: Heparin gtt Code Status: Full code Family Communication: None at bedside Disposition Plan: Pending cardiac workup/management   Consultants:   Cardiology  Cardiothoracic surgery  Procedures:  Cardiac cath (11/18)  Antimicrobials:   None    Subjective: Patient reports pain when nitro gtt was decreased, otherwise has been feeling well  Objective: Vitals:   11/01/16 1445 11/01/16 1500 11/01/16 1515 11/01/16 1530  BP: 99/71 101/70 90/62 91/63   Pulse: 96 94 93 87  Resp: (!) 22 (!) 33 20 20  Temp:      TempSrc:      SpO2: 96% 100% 97% 99%  Weight:      Height:        Intake/Output Summary (Last 24 hours) at 11/01/16 1541 Last data filed at 11/01/16 1500  Gross per 24 hour  Intake           1794.2 ml  Output             1425 ml  Net            369.2 ml   Filed Weights   10/30/16 2301 10/31/16 0251  Weight: 61.2 kg (135 lb) 71.8 kg (158 lb 4.6 oz)    Examination:  General exam: Appears calm  and comfortable Respiratory system: Clear to auscultation. Respiratory effort normal. Cardiovascular system: S1 & S2 heard, RRR. No murmurs, rubs, gallops or clicks. Gastrointestinal system: Abdomen is nondistended, soft and nontender. Normal bowel sounds heard. Central nervous system: Alert and oriented. No focal neurological deficits. Extremities: No edema. No calf tenderness Skin: No cyanosis. No rashes Psychiatry: Judgement and insight appear normal. Mood & affect appropriate.     Data Reviewed: I have personally reviewed following labs and imaging studies  CBC:  Recent Labs Lab 10/30/16 2000 10/31/16 0225 11/01/16 0636  WBC 5.8 5.2 4.1  NEUTROABS 3.9  --   --   HGB 13.3 13.6 12.2  HCT 38.0 38.3 35.9*  MCV 88.2 88.2 89.1  PLT 249 246 229   Basic Metabolic Panel:  Recent Labs Lab 10/30/16 2000 11/01/16 0636  NA 137 141  K 3.5 3.8  CL 105 108  CO2 24 26  GLUCOSE 110* 103*  BUN 6 5*  CREATININE 0.68 0.67  CALCIUM 9.1 8.7*   GFR: Estimated Creatinine Clearance: 86.8 mL/min (by C-G formula based on SCr of 0.67 mg/dL). Liver Function Tests:  Recent Labs Lab 10/30/16 2000  AST 19  ALT 15  ALKPHOS 106  BILITOT 0.7  PROT 6.8  ALBUMIN 4.1  No results for input(s): LIPASE, AMYLASE in the last 168 hours. No results for input(s): AMMONIA in the last 168 hours. Coagulation Profile: No results for input(s): INR, PROTIME in the last 168 hours. Cardiac Enzymes:  Recent Labs Lab 10/30/16 2000 10/31/16 0023 10/31/16 0225 10/31/16 1036 11/01/16 1150  TROPONINI 0.03* 0.09* 0.09* 0.06* 0.04*   BNP (last 3 results) No results for input(s): PROBNP in the last 8760 hours. HbA1C: No results for input(s): HGBA1C in the last 72 hours. CBG:  Recent Labs Lab 10/31/16 0821  GLUCAP 113*   Lipid Profile: No results for input(s): CHOL, HDL, LDLCALC, TRIG, CHOLHDL, LDLDIRECT in the last 72 hours. Thyroid Function Tests: No results for input(s): TSH, T4TOTAL,  FREET4, T3FREE, THYROIDAB in the last 72 hours. Anemia Panel: No results for input(s): VITAMINB12, FOLATE, FERRITIN, TIBC, IRON, RETICCTPCT in the last 72 hours. Sepsis Labs: No results for input(s): PROCALCITON, LATICACIDVEN in the last 168 hours.  Recent Results (from the past 240 hour(s))  MRSA PCR Screening     Status: None   Collection Time: 10/31/16  3:13 AM  Result Value Ref Range Status   MRSA by PCR NEGATIVE NEGATIVE Final    Comment:        The GeneXpert MRSA Assay (FDA approved for NASAL specimens only), is one component of a comprehensive MRSA colonization surveillance program. It is not intended to diagnose MRSA infection nor to guide or monitor treatment for MRSA infections.          Radiology Studies: Dg Chest 2 View  Result Date: 10/30/2016 CLINICAL DATA:  Chest pain and dyspnea x3 days with pain radiating to the neck and jaw. EXAM: CHEST  2 VIEW COMPARISON:  01/12/2016 FINDINGS: The heart size and mediastinal contours are within normal limits. Both lungs are clear. The visualized skeletal structures are unremarkable. IMPRESSION: No active cardiopulmonary disease. Electronically Signed   By: Tollie Ethavid  Kwon M.D.   On: 10/30/2016 19:48        Scheduled Meds: . ALPRAZolam  0.5 mg Oral Daily  . [START ON 11/02/2016] amLODipine  2.5 mg Oral Daily  . [START ON 11/02/2016] aspirin  81 mg Oral Pre-Cath  . aspirin EC  81 mg Oral Daily  . atorvastatin  40 mg Oral Daily  . buPROPion  300 mg Oral Daily  . doxepin  10-20 mg Oral QHS  . escitalopram  20 mg Oral Daily  . pantoprazole  40 mg Oral Daily  . ranolazine  500 mg Oral BID  . sodium chloride flush  3 mL Intravenous Q12H  . sodium chloride flush  3 mL Intravenous Q12H   Continuous Infusions: . sodium chloride Stopped (11/01/16 1039)  . [START ON 11/02/2016] sodium chloride     Followed by  . [START ON 11/02/2016] sodium chloride    . heparin 900 Units/hr (11/01/16 1033)  . nitroGLYCERIN 10 mcg/min  (11/01/16 [id number])  . tirofiban 0.15 mcg/kg/min (11/01/16 1318)     LOS: 1 day     Jacquelin Hawkingalph Mariea Mcmartin [facility] 11/01/2016, 3:42 PM Pager: [phone]  If 7PM-7AM, please contact night-coverage [email or web address] Password [email or web address] 11/01/2016, 3:42 PM

## 2016-11-01 NOTE — Consult Note (Signed)
[address]       [place] [address]             [phone]          CARDIOTHORACIC SURGERY CONSULTATION REPORT  PCP is Pearson Grippe, MD Referring Provider is Yvonne Kendall, MD Primary Cardiologist is [name], MD  Reason for consultation:  Multivessel CAD with unstable angina  HPI:  Patient is a 40 year old female with history of coronary artery disease status post PCI and stenting on 2 previous occasions, hyperlipidemia, long-standing tobacco abuse, a strong family history of coronary artery disease, bipolar disorder, history of substance abuse and occluding cocaine use in the remote past, and chronic pain on long-term oral narcotics who was admitted to the hospital with unstable angina pectoris and has been referred for surgical consultation to discuss treatment options for management of severe multivessel coronary artery disease. The patient's cardiac history dates back to April 2016 when she presented with an acute non-ST segment elevation myocardial infarction that occurred in the context of recent cocaine use. She was treated with bare-metal stenting to a diagonal branch of the left anterior descending coronary artery. She subsequently developed chronic chest pain and has undergone multiple cardiac catheterization studies despite the fact that she quit using illicit drugs other than long-term oral narcotics and tobacco. In January 2017 she presented with a second non-ST segment elevation myocardial infarction and was treated with overlapping drug-eluting stents in the left circumflex coronary artery for progressive obstructive disease.  The patient states that she did fairly well after her last stenting procedure in January of this year and she was last seen in follow-up by Dr. Excell Seltzer on 05/20/2016. Approximately 5 days ago the patient developed sudden onset recurrent substernal chest pain radiating to the neck that was similar to previous episodes but according  to the patient's somewhat more severe. Initially symptoms were alleviated by nitroglycerin, but the patient's symptoms continued to recur even when she was at rest or trying to sleep at night.  She was readmitted to the hospital 10/30/2016 for accelerating recurrent symptoms of substernal chest pain. She was pain free on arrival and EKG was without significant ST segment elevation. Serial troponin levels have been flat ranging from 0.03 to 0.09 at a maximum. She has continued to have intermittent episodes of chest pain for which she is currently on intravenous nitroglycerin, heparin, and tirofiban. She underwent diagnostic cardiac catheterization yesterday evening by Dr. Okey Dupre and was found to have significant progression of disease with 70% ostial stenosis of the left anterior descending coronary artery that was confirmed using IVUS and 70% ostial in-stent restenosis of the left circumflex coronary artery. There remained patent stent in the diagonal branch with very mild in-stent restenosis. There remained unchanged ostial stenosis of a small to medium-sized first OM branch. There is no significant disease in the right coronary artery territory. Cardiothoracic surgical consultation was requested to discuss treatment options. Plavix was discontinued after catheterization and tirofiban was started.  The patient is single and lives locally in [place] with her fianc. She has 2 adult children, one of whom lives with her. She is currently out of work. She reports no significant physical limitations other than that related to some chronic back pain for which she takes oxycodone on a daily basis. Prior to 4 days ago she reported only occasional episodes of substernal chest discomfort. She is currently pain-free but she has had a few brief repeat episodes of substernal chest pain  today that were relieved with nitroglycerin. She reports mild exertional shortness of breath at baseline. She denies any recent acute  exacerbations of shortness of breath, PND, orthopnea, or lower extremity edema. She continues to smoke cigarettes although she states that she has cut back. She states that she has not used any cocaine or other illicit drugs since her heart attack in 2016.  Urine tox screen was positive for amphetamines at the time of admission.  Past Medical History:  Diagnosis Date  . Anxiety   . Bipolar 1 disorder (HCC)   . Chronic pain   . Cocaine use   . Coronary artery disease    a. 03/2015 NSTEMI/PCI in setting of cocaine use - BMS to diagonal. b. NSTEMI 12/2015 s/o overlapping DES to Cx, residual mild RCA and LAD disease, 75% OM1.  . Depression   . Dyslipidemia   . Migraine   . Osteoarthritis   . Polysubstance abuse    a. tobacco/cocaine  . Scoliosis   . TIA (transient ischemic attack)     Past Surgical History:  Procedure Laterality Date  . CARDIAC CATHETERIZATION  03/21/2015   Procedure: CORONARY STENT INTERVENTION;  Surgeon: [name], MD;  Location: [facility] CATH LAB;  Service: Cardiovascular;;  diag bms 2.75 x 12 vision  . CARDIAC CATHETERIZATION N/A 01/14/2016   Procedure: Coronary Stent Intervention;  Surgeon: [name], MD;  Location: [facility] INVASIVE CV LAB;  Service: Cardiovascular;  Laterality: N/A;  . CORONARY STENT PLACEMENT  03/21/2015   first diagonal  . ESOPHAGOGASTRODUODENOSCOPY (EGD) WITH PROPOFOL N/A 06/11/2015   Procedure: ESOPHAGOGASTRODUODENOSCOPY (EGD) WITH PROPOFOL;  Surgeon: Bernette Redbird, MD;  Location[facility] ENDOSCOPY;  Service: Endoscopy;  Laterality: N/A;  . LEFT HEART CATHETERIZATION WITH CORONARY ANGIOGRAM N/A 03/21/2015   Procedure: LEFT HEART CATHETERIZATION WITH CORONARY ANGIOGRAM;  Surgeon: [name], MD;  Location: [facility] CATH LAB;  Service: Cardiovascular;  Laterality: N/A;  . LEFT HEART CATHETERIZATION WITH CORONARY ANGIOGRAM N/A 04/11/2015   Procedure: LEFT HEART CATHETERIZATION WITH CORONARY ANGIOGRAM;  Surgeon: Kathleene Hazel, MD;  Location: [facility] CATH LAB;   Service: Cardiovascular;  Laterality: N/A;    Family History  Problem Relation Age of Onset  . Mental retardation Mother   . Hypertension Mother   . Hyperlipidemia Mother   . Heart disease Mother   . Depression Mother   . Hypertension Father   . Hyperlipidemia Father   . Heart disease Father   . Mental retardation Father   . Diabetes Father   . Cancer Maternal Aunt   . Stroke Maternal Grandmother     Social History   Social History  . Marital status: Divorced    Spouse name: N/A  . Number of children: N/A  . Years of education: N/A   Occupational History  . Not on file.   Social History Main Topics  . Smoking status: Current Some Day Smoker    Packs/day: 0.10    Types: Cigarettes  . Smokeless tobacco: Never Used  . Alcohol use No  . Drug use: No     Comment: Prior cocaine use  . Sexual activity: Not on file   Other Topics Concern  . Not on file   Social History Narrative  . No narrative on file    Prior to Admission medications   Medication Sig Start Date End Date Taking? Authorizing Provider  ALPRAZolam Prudy Feeler) 0.5 MG tablet Take 0.5 mg by mouth daily.    Yes Historical Provider, MD  amLODipine (NORVASC) 5 MG tablet TAKE 1  TABLET BY MOUTH DAILY Patient taking differently: Take 5 mg by mouth once a day 07/21/16  Yes Tonny BollmanMichael Cooper, MD  aspirin EC 81 MG EC tablet Take 1 tablet (81 mg total) by mouth daily. 03/23/15  Yes Ok Anishristopher R Berge, NP  atorvastatin (LIPITOR) 40 MG tablet Take 1 tablet (40 mg total) by mouth daily. 03/13/16  Yes Leone BrandLaura R Ingold, NP  buPROPion (WELLBUTRIN XL) 300 MG 24 hr tablet Take 300 mg by mouth every morning. 10/20/16  Yes Historical Provider, MD  clopidogrel (PLAVIX) 75 MG tablet TAKE 1 TABLET BY MOUTH DAILY WITH BREAKFAST Patient taking differently: Take 75 mg by mouth once a day 07/21/16  Yes Tonny BollmanMichael Cooper, MD  doxepin (SINEQUAN) 10 MG capsule Take 10-20 mg by mouth at bedtime. 10/19/16  Yes Historical Provider, MD  escitalopram  (LEXAPRO) 20 MG tablet Take 20 mg by mouth daily. 01/18/16  Yes Historical Provider, MD  fluticasone (FLONASE) 50 MCG/ACT nasal spray Place 2 sprays into both nostrils daily as needed for allergies or rhinitis.   Yes Historical Provider, MD  isosorbide mononitrate (IMDUR) 30 MG 24 hr tablet Take 3 tablets (90 mg total) by mouth daily. Patient taking differently: Take 30-60 mg by mouth See admin instructions. 60 mg in the morning and 30 mg at bedtime 06/18/16  Yes Tonny BollmanMichael Cooper, MD  nitroGLYCERIN (NITROSTAT) 0.4 MG SL tablet Place 1 tablet (0.4 mg total) under the tongue every 5 (five) minutes as needed for chest pain. 05/20/16  Yes Tonny BollmanMichael Cooper, MD  ondansetron (ZOFRAN-ODT) 4 MG disintegrating tablet Take 4 mg by mouth daily as needed for nausea or vomiting. DISSOLVE 10/19/16  Yes Historical Provider, MD  Oxycodone HCl 10 MG TABS Take 10 mg by mouth 3 (three) times daily as needed (for pain).    Yes Historical Provider, MD  pantoprazole (PROTONIX) 40 MG tablet TAKE 1 TABLET BY MOUTH TWICE A DAY BEFORE MEALS Patient taking differently: Take 40 mg by mouth two times a day before meals 07/21/16  Yes Tonny BollmanMichael Cooper, MD  ranolazine (RANEXA) 500 MG 12 hr tablet Take 1 tablet (500 mg total) by mouth 2 (two) times daily. 09/24/16  Yes Tonny BollmanMichael Cooper, MD  VYVANSE 40 MG capsule Take 40 mg by mouth every morning. 10/28/16  Yes Historical Provider, MD  acetaminophen (TYLENOL) 325 MG tablet Take 2 tablets (650 mg total) by mouth every 4 (four) hours as needed for headache or mild pain. Patient not taking: Reported on 10/30/2016 01/15/16   Abelino DerrickLuke K Kilroy, PA-C  buPROPion [facility](WELLBUTRIN SR) 150 MG 12 hr tablet Take 1-2 tablets (150-300 mg total) by mouth 2 (two) times daily. Takes 300mg  in am and 150mg  in pm Patient not taking: Reported on 10/30/2016 06/11/15   Joseph ArtJessica U Vann, DO    Current Facility-Administered Medications  Medication Dose Route Frequency Provider Last Rate Last Dose  . 0.9 %  sodium chloride infusion    Intravenous Continuous Lorre NickAnthony Allen, MD   Stopped at 11/01/16 1039  . 0.9 %  sodium chloride infusion  250 mL Intravenous PRN Kathleene Hazelhristopher D McAlhany, MD      . 0.9 %  sodium chloride infusion  250 mL Intravenous PRN Yvonne Kendallhristopher End, MD      . Melene Muller[START ON 11/02/2016] 0.9% sodium chloride infusion  3 mL/kg/hr Intravenous Continuous Kathleene Hazelhristopher D McAlhany, MD       Followed by  . [START ON 11/02/2016] 0.9% sodium chloride infusion  1 mL/kg/hr Intravenous Continuous Kathleene Hazelhristopher D McAlhany, MD      .  acetaminophen (TYLENOL) tablet 650 mg  650 mg Oral Q4H PRN Eduard Clos, MD      . ALPRAZolam Prudy Feeler) tablet 0.5 mg  0.5 mg Oral Daily Eduard Clos, MD   0.5 mg at 11/01/16 0931  . [START ON 11/02/2016] amLODipine (NORVASC) tablet 2.5 mg  2.5 mg Oral Daily Duke Salvia, MD      . Melene Muller ON 11/02/2016] aspirin chewable tablet 81 mg  81 mg Oral Pre-Cath Kathleene Hazel, MD      . aspirin EC tablet 81 mg  81 mg Oral Daily Eduard Clos, MD   81 mg at 11/01/16 0931  . atorvastatin (LIPITOR) tablet 40 mg  40 mg Oral Daily Eduard Clos, MD   40 mg at 11/01/16 0931  . buPROPion (WELLBUTRIN XL) 24 hr tablet 300 mg  300 mg Oral Daily Eduard Clos, MD   300 mg at 11/01/16 0935  . doxepin (SINEQUAN) capsule 10-20 mg  10-20 mg Oral QHS Eduard Clos, MD   20 mg at 10/31/16 2248  . escitalopram (LEXAPRO) tablet 20 mg  20 mg Oral Daily Eduard Clos, MD   20 mg at 11/01/16 0931  . fluticasone (FLONASE) 50 MCG/ACT nasal spray 2 spray  2 spray Each Nare Daily PRN Eduard Clos, MD      . heparin ADULT infusion 100 units/mL (25000 units/262mL sodium chloride 0.45%)  900 Units/hr Intravenous Continuous Duke Salvia, MD 9 mL/hr at 11/01/16 1033 900 Units/hr at 11/01/16 1033  . LORazepam (ATIVAN) injection 1-2 mg  1-2 mg Intravenous Q6H PRN Duke Salvia, MD      . magnesium hydroxide (MILK OF MAGNESIA) suspension 30 mL  30 mL Oral Daily PRN Narda Bonds, MD   30  mL at 11/01/16 1135  . morphine 4 MG/ML injection 4 mg  4 mg Intravenous Q2H PRN Duke Salvia, MD      . nitroGLYCERIN (NITROSTAT) SL tablet 0.4 mg  0.4 mg Sublingual Q5 min PRN Lorre Nick, MD   0.4 mg at 10/31/16 1627  . nitroGLYCERIN 50 mg in dextrose 5 % 250 mL (0.2 mg/mL) infusion  0-200 mcg/min Intravenous Titrated Yvonne Kendall, MD 3 mL/hr at 11/01/16 0938 10 mcg/min at 11/01/16 0938  . ondansetron (ZOFRAN) injection 4 mg  4 mg Intravenous Q6H PRN Eduard Clos, MD      . pantoprazole (PROTONIX) EC tablet 40 mg  40 mg Oral Daily Eduard Clos, MD   40 mg at 11/01/16 0931  . ranolazine (RANEXA) 12 hr tablet 500 mg  500 mg Oral BID Eduard Clos, MD   500 mg at 11/01/16 0931  . sodium chloride flush (NS) 0.9 % injection 3 mL  3 mL Intravenous Q12H Kathleene Hazel, MD   3 mL at 11/01/16 0932  . sodium chloride flush (NS) 0.9 % injection 3 mL  3 mL Intravenous PRN Kathleene Hazel, MD      . sodium chloride flush (NS) 0.9 % injection 3 mL  3 mL Intravenous Q12H Yvonne Kendall, MD   3 mL at 11/01/16 0932  . sodium chloride flush (NS) 0.9 % injection 3 mL  3 mL Intravenous PRN Christopher End, MD      . tirofiban (AGGRASTAT) infusion 50 mcg/mL 100 mL  0.15 mcg/kg/min Intravenous Continuous Narda Bonds, MD 12.9 mL/hr at 11/01/16 1318 0.15 mcg/kg/min at 11/01/16 1318    Allergies  Allergen Reactions  . Ranexa [Ranolazine] Nausea  Only    Dizziness (only the 1,000 mg dose has this effect)      Review of Systems:   General:  normal appetite, decreased energy, + weight gain, no weight loss, no fever  Cardiac:  + chest pain with exertion, + chest pain at rest, +SOB with exertion, no resting SOB, no PND, no orthopnea, no palpitations, no arrhythmia, no atrial fibrillation, no LE edema, no dizzy spells, no syncope  Respiratory:  no shortness of breath, no home oxygen, no productive cough, no dry cough, no bronchitis, no wheezing, no hemoptysis, no asthma, no  pain with inspiration or cough, no sleep apnea, no CPAP at night  GI:   no difficulty swallowing, no reflux, no frequent heartburn, no hiatal hernia, no abdominal pain, + constipation, no diarrhea, no hematochezia, no hematemesis, no melena  GU:   no dysuria,  no frequency, no urinary tract infection, no hematuria, no kidney stones, no kidney disease  Vascular:  no pain suggestive of claudication, no pain in feet, no leg cramps, no varicose veins, no DVT, no non-healing foot ulcer  Neuro:   no stroke, no TIA's, no seizures, + headaches, no temporary blindness one eye,  no slurred speech, no peripheral neuropathy, + chronic pain, no instability of gait, no memory/cognitive dysfunction  Musculoskeletal: + arthritis - primarily involving the back, no joint swelling, no myalgias, no difficulty walking, normal mobility   Skin:   no rash, no itching, no skin infections, no pressure sores or ulcerations  Psych:   + anxiety, + depression, no nervousness, no unusual recent stress  Eyes:   + blurry vision, no floaters, + recent vision changes, does not wears glasses or contacts  ENT:   no hearing loss, no loose or painful teeth, no dentures, last saw dentist several years ago  Hematologic:  no easy bruising, no abnormal bleeding, no clotting disorder, no frequent epistaxis  Endocrine:  no diabetes, does not check CBG's at home     Physical Exam:   BP 92/71   Pulse 79   Temp 97.8 F (36.6 C) (Oral)   Resp (!) 22   Ht 5\' 2"  (1.575 m)   Wt 158 lb 4.6 oz (71.8 kg)   LMP 10/27/2016 (Exact Date)   SpO2 96%   BMI 28.95 kg/m   General:  Mildly obese,  well-appearing  HEENT:  Unremarkable   Neck:   no JVD, no bruits, no adenopathy   Chest:   clear to auscultation, symmetrical breath sounds, no wheezes, no rhonchi   CV:   RRR, no  murmur   Abdomen:  soft, non-tender, no masses   Extremities:  warm, well-perfused, pulses thready but palpable, no lower extremity  edema  Rectal/GU  Deferred  Neuro:   Grossly non-focal and symmetrical throughout  Skin:   Clean and dry, no rashes, no breakdown  Diagnostic Tests:  Procedures   Intravascular Ultrasound/IVUS  Left Heart Cath and Coronary Angiography  Conclusion   Conclusions: 1. Significant 2-vessel coronary artery disease with 70% ostial LAD (MLA 3.1 mm^2 by IVUS) and 70% ostial LCx in-stent restenosis (MLA 3.0 mm^2). 2. Patent stent in diagonal branch with mild in-stent restenosis. 3. Unchanged ostial OM1 stenosis. 4. Normal left ventricular filling pressure.  Recommendations: 1. Transfer to CICU. 2. Initiate tirofiban infusion and discontinue clopidogrel. 3. Initiate low-dose nitroglycerin infusion for component of vasospasm. 4. Cardiac surgery consultation with heart-team discussion to evaluate for CABG versus high risk PCI to ostial LAD and LCx. 5. Smoking cessation c  Indications   Unstable angina (HCC) [I20.0 (ICD-10-CM)]  Coronary artery disease involving native coronary artery of native heart with unstable angina pectoris (HCC) [I25.110 (ICD-10-CM)]  Procedural Details/Technique   Technical Details Indication: 40 y.o. year-old woman with history of coronary artery disease s/p PCI to diagonal and LCx (12/2015), dyslipidemia, anxiety/depression, and polysubstance abuse, who presented with acute onset of chest pain. Troponins were borderline elevated, consistent with unstable angina. She has been referred for left heart catheterization and possible PCI.  GFR: >60 ml/min  Procedure: The risks, benefits, complications, treatment options, and expected outcomes were discussed with the patient. The patient and/or family concurred with the proposed plan, giving informed consent. The patient was brought to the cath lab after IV hydration was begun and oral premedication was given. The patient was further sedated with Versed and Fentanyl. The right wrist was assessed with a modified Allens test  which was normal. The right wrist was prepped and draped in a sterile fashion. 1% lidocaine was used for local anesthesia. Using the modified Seldinger access technique, a 6 French Terumo slender Glidesheath was placed in the right radial artery. 3 mg Verapamil was given through the sheath. Heparin 3500 units were administered.  Selective coronary angiography was performed using 763F EBU3 and JR4 catheters to engage the left and right coronary arteries, respectively. Left heart catheterization was performed using a 763F JR4 catheter. Left ventriculogram was not performed.  IVUS of LAD and LCx Heparin was used for anticoagulation. The left coronary artery was engaged with a 91F CLS3 catheter. A Prowater wire was advanced into the distal LAD. Vasospasm of the LAD occurred with wire advancement, which resolved with intracoronary nitroglycerin. A Cougar wire was advanced into the LCx. Intravascular ultrasound was performed in the LAD and LCx, demonstrating minimal luminal areas at the ostia of 3.1 mm^2 (LAD) and 3.0 mm^2 (LCx). Final angiogram demonstrates minimal vasospasm in the mid LAD with TIMI-3 flow throughout the left coronary artery.  At the end of the procedure, the radial artery sheath was removed and a TR band applied to achieve patent hemostasis. There were no immediate complications. The patient will be transferred to CICU for medical therapy and monitoring.  Contrast used: 120 mL Isovue Fluoroscopy time: 16.3 min Radiation dose: 351 mGy   Estimated blood loss <50 mL.  During this procedure the patient was administered the following to achieve and maintain moderate conscious sedation: Versed 3 mg, Fentanyl 75 mcg, while the patient's heart rate, blood pressure, and oxygen saturation were continuously monitored. The period of conscious sedation was 76 minutes, of which I was present face-to-face 100% of this time.    Coronary Findings   Dominance: Right  Left Anterior Descending  Ost LAD  lesion, 70% stenosed. IVUS was performed on the lesion. Minimum lumen diameter: 3.1 mm. IVUS has determined that the lesion is heterogeneous.  Mid LAD lesion, 25% stenosed.  First Diagonal Branch  1st Diag lesion, 20% stenosed. The lesion is segmental. The lesion was previously treatedbetween 6-12 months ago. Mild diffuse ISR in the bare metal stent  Left Circumflex  Ost Cx lesion, 70% stenosed. IVUS was performed on the lesion. Minimum lumen diameter: 3 mm. IVUS has determined that the lesion is heterogeneous.  Ost Cx to Prox Cx lesion, 0% stenosed. Ost Cx to Prox Cx lesion with no stenosis was previously treated.  Prox Cx to Mid Cx lesion, 0% stenosed. Prox Cx to Mid Cx lesion with no stenosis was previously treated.  First Biomedical engineerbtuse Marginal Branch  Ost 1st Mrg to 1st Mrg lesion, 75% stenosed. Ostial OM1 lesion, small to medium caliber vessel  Right Coronary Artery  Mid RCA lesion, 30% stenosed. The lesion is segmental.  Left Heart   Left Ventricle LV end diastolic pressure is normal.    Coronary Diagrams   Diagnostic Diagram     Implants     No implant documentation for this case.  PACS Images   Show images for Cardiac catheterization   Link to Procedure Log   Procedure Log    Hemo Data   Flowsheet Row Most Recent Value  AO Systolic Pressure 102 mmHg  AO Diastolic Pressure 68 mmHg  AO Mean 85 mmHg  LV Systolic Pressure 96 mmHg  LV Diastolic Pressure 2 mmHg  LV EDP 10 mmHg  Arterial Occlusion Pressure Extended Systolic Pressure 99 mmHg  Arterial Occlusion Pressure Extended Diastolic Pressure 58 mmHg  Arterial Occlusion Pressure Extended Mean Pressure 78 mmHg  Left Ventricular Apex Extended Systolic Pressure 98 mmHg  Left Ventricular Apex Extended Diastolic Pressure 2 mmHg  Left Ventricular Apex Extended EDP Pressure 12 mmHg      Impression:  I have personally reviewed the patient's diagnostic cardiac catheterization.  She has severe two-vessel coronary artery  disease with significant ostial stenosis of both the left anterior descending coronary artery and the left circumflex coronary artery. She has undergone multiple PCI and stenting procedures in the past, most recently in January of this year. Disease has progressed significantly and the patient is now having symptoms consistent with unstable angina pectoris.  Her clinical presentation is further colored by the presence of problems with anxiety and substance abuse.  Options include another attempt at multivessel PCI and stenting versus surgical revascularization. Given the patient's young age and ostial stenosis involving both the left anterior descending coronary artery and the left circumflex coronary artery, I favor surgical revascularization as a better long-term treatment option.    Plan:  I have reviewed the indications, risks, and potential benefits of coronary artery bypass grafting with the patient and her fianc at the bedside.  Alternative treatment strategies have been discussed, including the relative risks, benefits and long term prognosis associated with medical therapy, percutaneous coronary intervention, and surgical revascularization.  The patient understands and accepts all potential associated risks of surgery including but not limited to risk of death, stroke or other neurologic complication, myocardial infarction, congestive heart failure, respiratory failure, renal failure, bleeding requiring blood transfusion and/or reexploration, aortic dissection or other major vascular complication, arrhythmia, heart block or bradycardia requiring permanent pacemaker, pneumonia, pleural effusion, wound infection, pulmonary embolus or other thromboembolic complication, chronic pain or other delayed complications related to median sternotomy, or the late recurrence of symptomatic ischemic heart disease and/or congestive heart failure.  The importance of long term risk modification have been emphasized.   All questions answered.  The patient has been taking Plavix at home and received most recently on the morning of Friday, 10/31/2016. Risks associated with surgery would be diminished if surgery can be delayed a few days to allow Plavix to washout. Present the patient is pain-free and comfortable.  We will continue to follow closely and make plans for surgery as soon as practical and based upon her continued stability.   I spent in excess of 120 minutes during the conduct of this hospital consultation and >50% of this time involved direct face-to-face encounter for counseling and/or coordination of the patient's care.    Salvatore Decent. Cornelius Moras, MD 11/01/2016 2:56 PM

## 2016-11-01 NOTE — Progress Notes (Signed)
ANTICOAGULATION CONSULT NOTE - Follow Up Consult  Pharmacy Consult Tirofiban infusion / Heparin infusion  Indication: chest pain/ACS  Allergies  Allergen Reactions  . Ranexa [Ranolazine] Nausea Only    Dizziness (only the 1,000 mg dose has this effect)    Patient Measurements: Height: 5\' 2"  (157.5 cm) Weight: 158 lb 4.6 oz (71.8 kg) IBW/kg (Calculated) : 50.1 Heparin Dosing Weight: 65 kg  Vital Signs: Temp: 98.2 F (36.8 C) (11/18 0809) Temp Source: Axillary (11/18 0809) BP: 94/55 (11/18 0945) Pulse Rate: 72 (11/18 0945)  Labs:  Recent Labs  10/30/16 2000 10/31/16 0023 10/31/16 0215 10/31/16 0225 10/31/16 1036 11/01/16 0636  HGB 13.3  --   --  13.6  --  12.2  HCT 38.0  --   --  38.3  --  35.9*  PLT 249  --   --  246  --  229  HEPARINUNFRC  --   --  0.20*  --  0.25*  --   CREATININE 0.68  --   --   --   --  0.67  TROPONINI 0.03* 0.09*  --  0.09* 0.06*  --     Estimated Creatinine Clearance: 86.8 mL/min (by C-G formula based on SCr of 0.67 mg/dL).  Assessment: 40 yo f presenting with CP and started on IV heparin infusion for r/o ACS.  Now s/p cardiac cath, revealed significant 2-vessel CAD - with instent restenosis on LCX stent from 1/18 - discussion of CABG v PCI.  Initiated Tirofiban bolus & infusion  in the cath lab 11/17.  CBC stable, ongoing CP and new ECG changes> restart heparin drip as well. [name].  PMH: CAD, HTN, Hx of cocaine   Goal of Therapy:  HL 0.3-0.5 Monitor platelets by anticoagulation protocol: Yes   Plan:  Tirofiban infusion 0.15 mcg/kg/min  Monitor PLTC daily Heparin drip 900 uts/hr  Check 6hr HL and daily  Leota SauersLisa Reana Chacko Pharm.D. CPP, BCPS Clinical Pharmacist [phone] 11/01/2016 10:08 AM

## 2016-11-01 NOTE — Progress Notes (Signed)
ANTICOAGULATION CONSULT NOTE - Follow Up Consult  Pharmacy Consult Tirofiban infusion / Heparin infusion  Indication: chest pain/ACS  Allergies  Allergen Reactions  . Ranexa [Ranolazine] Nausea Only    Dizziness (only the 1,000 mg dose has this effect)    Patient Measurements: Height: 5\' 2"  (157.5 cm) Weight: 158 lb 4.6 oz (71.8 kg) IBW/kg (Calculated) : 50.1 Heparin Dosing Weight: 65 kg  Vital Signs: Temp: 98.3 F (36.8 C) (11/18 1615) Temp Source: Oral (11/18 1615) BP: 89/61 (11/18 1645) Pulse Rate: 83 (11/18 1645)  Labs:  Recent Labs  10/30/16 2000  10/31/16 0215 10/31/16 0225 10/31/16 1036 11/01/16 0636 11/01/16 1150 11/01/16 1623  HGB 13.3  --   --  13.6  --  12.2  --   --   HCT 38.0  --   --  38.3  --  35.9*  --   --   PLT 249  --   --  246  --  229  --   --   HEPARINUNFRC  --   --  0.20*  --  0.25*  --   --  0.14*  CREATININE 0.68  --   --   --   --  0.67  --   --   TROPONINI 0.03*  < >  --  0.09* 0.06*  --  0.04*  --   < > = values in this interval not displayed.  Estimated Creatinine Clearance: 86.8 mL/min (by C-G formula based on SCr of 0.67 mg/dL).  Assessment: 40 yo f presenting with CP and started on IV heparin infusion for r/o ACS.  Now s/p cardiac cath, revealed significant 2-vessel CAD - with instent restenosis on LCX stent from 1/18 - discussion of CABG v PCI.  Initiated Tirofiban bolus & infusion  in the cath lab 11/17.  CBC stable, ongoing CP and new ECG changes> restart heparin drip as well.  This evening her heparin level is subtherapeutic on 900 units/hr. No bleeding noted.  PMH: CAD, HTN, Hx of cocaine   Goal of Therapy:  HL 0.3-0.5 Monitor platelets by anticoagulation protocol: Yes   Plan:  Tirofiban infusion 0.15 mcg/kg/min  Monitor PLTC daily Increase Heparin drip to 1100 units/hr  Check 6hr HL and daily  Estella HuskMichelle Meagon Duskin, Pharm.D., BCPS, AAHIVP Clinical Pharmacist Phone: [phone] or [phone] 11/01/2016, 5:58 PM

## 2016-11-01 NOTE — Progress Notes (Addendum)
Patient Name: Christina CascoKelly Morrison      SUBJECTIVE: 40 year old lady with known coronary artery disease presented with chest pain 10/30/16.    Catheterization demonstrated left sided disease with plan for CT surgery consultation versus high risk PCI.  Her Ongoing chest pain today. ECG continues to demonstrate progressive T-wave inversions is concerning   Past Medical History:  Diagnosis Date  . Anxiety   . Bipolar 1 disorder (HCC)   . Chronic pain   . Cocaine use   . Coronary artery disease    a. 03/2015 NSTEMI/PCI in setting of cocaine use - BMS to diagonal. b. NSTEMI 12/2015 s/o overlapping DES to Cx, residual mild RCA and LAD disease, 75% OM1.  . Depression   . Dyslipidemia   . Migraine   . Osteoarthritis   . Polysubstance abuse    a. tobacco/cocaine  . Scoliosis   . TIA (transient ischemic attack)     Scheduled Meds:  Scheduled Meds: . ALPRAZolam  0.5 mg Oral Daily  . amLODipine  5 mg Oral Daily  . [START ON 11/02/2016] aspirin  81 mg Oral Pre-Cath  . aspirin EC  81 mg Oral Daily  . atorvastatin  40 mg Oral Daily  . buPROPion  300 mg Oral Daily  . doxepin  10-20 mg Oral QHS  . escitalopram  20 mg Oral Daily  . heparin  5,000 Units Subcutaneous Q8H  . isosorbide mononitrate  60 mg Oral [id number]   And  . isosorbide mononitrate  30 mg Oral QHS  . lisdexamfetamine  40 mg Oral Daily  . pantoprazole  40 mg Oral Daily  . ranolazine  500 mg Oral BID  . sodium chloride flush  3 mL Intravenous Q12H  . sodium chloride flush  3 mL Intravenous Q12H   Continuous Infusions: . sodium chloride 10 mL/hr at 11/01/16 0800  . [START ON 11/02/2016] sodium chloride     Followed by  . [START ON 11/02/2016] sodium chloride    . nitroGLYCERIN 10 mcg/min (11/01/16 [id number])  . tirofiban     sodium chloride, sodium chloride, acetaminophen, fluticasone, morphine injection, nitroGLYCERIN, ondansetron (ZOFRAN) IV, sodium chloride flush, sodium chloride flush    PHYSICAL EXAM Vitals:    11/01/16 0845 11/01/16 0900 11/01/16 0915 11/01/16 0930  BP: 107/71 107/83 106/84 112/82  Pulse: 69 78 83 83  Resp: 19 17 17 17   Temp:      TempSrc:      SpO2: 99% 100% 99% 100%  Weight:      Height:       Well developed and nourished in no acute distress HENT normal Neck supple with JVP-flat Clear Regular rate and rhythm, no murmurs or gallops Abd-soft with active BS No Clubbing cyanosis edema Skin-warm and dry A & Oriented  Grossly normal sensory and motor function   Telemetry Personally reviewed  Sinus    Intake/Output Summary (Last 24 hours) at 11/01/16 0949 Last data filed at 11/01/16 0900  Gross per 24 hour  Intake          1581.13 ml  Output             1425 ml  Net           156.13 ml    LABS: Basic Metabolic Panel:  Recent Labs Lab 10/30/16 2000 11/01/16 0636  NA 137 141  K 3.5 3.8  CL 105 108  CO2 24 26  GLUCOSE 110* 103*  BUN 6 5*  CREATININE 0.68  0.67  CALCIUM 9.1 8.7*   Cardiac Enzymes:  Recent Labs  10/31/16 0023 10/31/16 0225 10/31/16 1036  TROPONINI 0.09* 0.09* 0.06*   CBC:  Recent Labs Lab 10/30/16 2000 10/31/16 0225 11/01/16 0636  WBC 5.8 5.2 4.1  NEUTROABS 3.9  --   --   HGB 13.3 13.6 12.2  HCT 38.0 38.3 35.9*  MCV 88.2 88.2 89.1  PLT 249 246 229   PROTIME: No results for input(s): LABPROT, INR in the last 72 hours. Liver Function Tests:  Recent Labs  10/30/16 2000  AST 19  ALT 15  ALKPHOS 106  BILITOT 0.7  PROT 6.8  ALBUMIN 4.1   No results for input(s): LIPASE, AMYLASE in the last 72 hours. BNP: BNP (last 3 results) No results for input(s): BNP in the last 8760 hours.  ProBNP (last 3 results) No results for input(s): PROBNP in the last 8760 hours.  D-Dimer:     ASSESSMENT AND PLAN:  Principal Problem:   Unstable angina (HCC) Active Problems:   Tobacco use disorder   CAD S/P percutaneous coronary angioplasty   Dyslipidemia   Chest pain   Hypertension  Ongoing chest pain is worrisome.  This particular in the context of worsening T wave inversions. The opportunities for intervention yesterday catheterization however were not clear. He does have a history of spasm and as her pain is intermittent spasm may be contributing. Will continue to use intravenous nitrates; add low dose benzo  We'll recheck troponin and with less chest pain rechecked ECG. We need to review again with interventional  Advised to stop smoking  Brief literature review also highlights an association between the use of ADD medicines   AND coronary vasospasm. We will hold Vyvanse  Signed, Sherryl MangesSteven Lavenia Stumpo MD  11/01/2016

## 2016-11-02 DIAGNOSIS — R072 Precordial pain: Secondary | ICD-10-CM

## 2016-11-02 DIAGNOSIS — F1190 Opioid use, unspecified, uncomplicated: Secondary | ICD-10-CM | POA: Diagnosis present

## 2016-11-02 DIAGNOSIS — I25110 Atherosclerotic heart disease of native coronary artery with unstable angina pectoris: Secondary | ICD-10-CM

## 2016-11-02 LAB — BLOOD GAS, ARTERIAL
Acid-base deficit: 1.7 mmol/L (ref 0.0–2.0)
BICARBONATE: 22.7 mmol/L (ref 20.0–28.0)
DRAWN BY: 270221
O2 Content: 2 L/min
O2 Saturation: 96.7 %
PH ART: 7.372 (ref 7.350–7.450)
PO2 ART: 86.5 mmHg (ref 83.0–108.0)
Patient temperature: 98.6
pCO2 arterial: 40.1 mmHg (ref 32.0–48.0)

## 2016-11-02 LAB — TROPONIN I

## 2016-11-02 LAB — CBC
HCT: 34.6 % — ABNORMAL LOW (ref 36.0–46.0)
Hemoglobin: 11.9 g/dL — ABNORMAL LOW (ref 12.0–15.0)
MCH: 30.7 pg (ref 26.0–34.0)
MCHC: 34.4 g/dL (ref 30.0–36.0)
MCV: 89.2 fL (ref 78.0–100.0)
PLATELETS: 246 10*3/uL (ref 150–400)
RBC: 3.88 MIL/uL (ref 3.87–5.11)
RDW: 12.4 % (ref 11.5–15.5)
WBC: 5.2 10*3/uL (ref 4.0–10.5)

## 2016-11-02 LAB — COMPREHENSIVE METABOLIC PANEL
ALBUMIN: 3.7 g/dL (ref 3.5–5.0)
ALK PHOS: 103 U/L (ref 38–126)
ALT: 16 U/L (ref 14–54)
ANION GAP: 7 (ref 5–15)
AST: 18 U/L (ref 15–41)
BUN: 6 mg/dL (ref 6–20)
CALCIUM: 9.1 mg/dL (ref 8.9–10.3)
CO2: 25 mmol/L (ref 22–32)
Chloride: 109 mmol/L (ref 101–111)
Creatinine, Ser: 0.73 mg/dL (ref 0.44–1.00)
GFR calc non Af Amer: >60 mL/min (ref >60)
GLUCOSE: 118 mg/dL — AB (ref 65–99)
POTASSIUM: 3.7 mmol/L (ref 3.5–5.1)
SODIUM: 141 mmol/L (ref 135–145)
Total Bilirubin: 0.5 mg/dL (ref 0.3–1.2)
Total Protein: 6.7 g/dL (ref 6.5–8.1)

## 2016-11-02 LAB — HEPARIN LEVEL (UNFRACTIONATED)
HEPARIN UNFRACTIONATED: 0.21 [IU]/mL — AB (ref 0.30–0.70)
Heparin Unfractionated: 0.34 IU/mL (ref 0.30–0.70)

## 2016-11-02 LAB — PREALBUMIN: Prealbumin: 20.8 mg/dL (ref 18–38)

## 2016-11-02 MED ORDER — TEMAZEPAM 15 MG PO CAPS: 15.0000 mg | ORAL_CAPSULE | Freq: Once | ORAL | Status: DC | PRN

## 2016-11-02 MED ORDER — CHLORHEXIDINE GLUCONATE CLOTH 2 % EX PADS
6.0000 | MEDICATED_PAD | Freq: Once | CUTANEOUS | Status: AC
  Administered 2016-11-02: 6 via TOPICAL

## 2016-11-02 MED ORDER — TRANEXAMIC ACID 1000 MG/10ML IV SOLN
1.5000 mg/kg/h | INTRAVENOUS | Status: AC
  Administered 2016-11-03: 1.5 mg/kg/h via INTRAVENOUS
  Filled 2016-11-02: qty 25

## 2016-11-02 MED ORDER — DEXTROSE 5 % IV SOLN
750.0000 mg | INTRAVENOUS | Status: DC
  Filled 2016-11-02: qty 750

## 2016-11-02 MED ORDER — DEXMEDETOMIDINE HCL IN NACL 400 MCG/100ML IV SOLN
0.1000 ug/kg/h | INTRAVENOUS | Status: AC
  Administered 2016-11-03: .3 ug/kg/h via INTRAVENOUS
  Filled 2016-11-02: qty 100

## 2016-11-02 MED ORDER — DOPAMINE-DEXTROSE 3.2-5 MG/ML-% IV SOLN
0.0000 ug/kg/min | INTRAVENOUS | Status: AC
  Administered 2016-11-03: 3 ug/kg/min via INTRAVENOUS
  Filled 2016-11-02: qty 250

## 2016-11-02 MED ORDER — MAGNESIUM SULFATE 50 % IJ SOLN
40.0000 meq | INTRAMUSCULAR | Status: DC
  Filled 2016-11-02: qty 10

## 2016-11-02 MED ORDER — OXYCODONE HCL 5 MG PO TABS
5.0000 mg | ORAL_TABLET | ORAL | Status: DC | PRN
  Administered 2016-11-02 – 2016-11-03 (×3): 5 mg via ORAL
  Filled 2016-11-02 (×3): qty 1

## 2016-11-02 MED ORDER — METOPROLOL TARTRATE 12.5 MG HALF TABLET
12.5000 mg | ORAL_TABLET | Freq: Once | ORAL | Status: AC
  Administered 2016-11-03: 12.5 mg via ORAL
  Filled 2016-11-02: qty 1

## 2016-11-02 MED ORDER — SODIUM CHLORIDE 0.9 % IV SOLN
INTRAVENOUS | Status: AC
  Administered 2016-11-03: 1.1 [IU]/h via INTRAVENOUS
  Filled 2016-11-02: qty 2.5

## 2016-11-02 MED ORDER — CHLORHEXIDINE GLUCONATE 0.12 % MT SOLN
15.0000 mL | Freq: Once | OROMUCOSAL | Status: AC
  Administered 2016-11-03: 15 mL via OROMUCOSAL
  Filled 2016-11-02: qty 15

## 2016-11-02 MED ORDER — OXYCODONE HCL ER 10 MG PO T12A
10.0000 mg | EXTENDED_RELEASE_TABLET | Freq: Two times a day (BID) | ORAL | Status: DC
  Administered 2016-11-02 (×2): 10 mg via ORAL
  Filled 2016-11-02 (×2): qty 1

## 2016-11-02 MED ORDER — TRANEXAMIC ACID (OHS) BOLUS VIA INFUSION
15.0000 mg/kg | INTRAVENOUS | Status: AC
  Administered 2016-11-03: 1077 mg via INTRAVENOUS
  Filled 2016-11-02: qty 1077

## 2016-11-02 MED ORDER — DEXTROSE 5 % IV SOLN
1.5000 g | INTRAVENOUS | Status: AC
  Administered 2016-11-03: 1.5 g via INTRAVENOUS
  Administered 2016-11-03: .75 g via INTRAVENOUS
  Filled 2016-11-02: qty 1.5

## 2016-11-02 MED ORDER — PLASMA-LYTE 148 IV SOLN
INTRAVENOUS | Status: AC
  Administered 2016-11-03: 500 mL
  Filled 2016-11-02: qty 2.5

## 2016-11-02 MED ORDER — VANCOMYCIN HCL 10 G IV SOLR
1250.0000 mg | INTRAVENOUS | Status: AC
  Administered 2016-11-03: 1250 mg via INTRAVENOUS
  Filled 2016-11-02: qty 1250

## 2016-11-02 MED ORDER — TRANEXAMIC ACID (OHS) PUMP PRIME SOLUTION
2.0000 mg/kg | INTRAVENOUS | Status: DC
  Filled 2016-11-02: qty 1.44

## 2016-11-02 MED ORDER — VANCOMYCIN HCL 1000 MG IV SOLR
INTRAVENOUS | Status: DC
  Filled 2016-11-02: qty 1000

## 2016-11-02 MED ORDER — SODIUM CHLORIDE 0.9 % IV SOLN
INTRAVENOUS | Status: DC
  Filled 2016-11-02: qty 30

## 2016-11-02 MED ORDER — PHENYLEPHRINE HCL 10 MG/ML IJ SOLN
30.0000 ug/min | INTRAVENOUS | Status: DC
  Filled 2016-11-02: qty 2

## 2016-11-02 MED ORDER — NITROGLYCERIN IN D5W 200-5 MCG/ML-% IV SOLN
2.0000 ug/min | INTRAVENOUS | Status: DC
  Filled 2016-11-02: qty 250

## 2016-11-02 MED ORDER — VANCOMYCIN HCL 1000 MG IV SOLR
INTRAVENOUS | Status: AC
  Administered 2016-11-03: 1000 mL
  Filled 2016-11-02: qty 1000

## 2016-11-02 MED ORDER — EPINEPHRINE PF 1 MG/ML IJ SOLN
0.0000 ug/min | INTRAMUSCULAR | Status: DC
  Filled 2016-11-02: qty 4

## 2016-11-02 MED ORDER — POTASSIUM CHLORIDE 2 MEQ/ML IV SOLN
80.0000 meq | INTRAVENOUS | Status: DC
  Filled 2016-11-02: qty 40

## 2016-11-02 NOTE — Progress Notes (Signed)
Patient Name: Christina CascoKelly Groeneveld      SUBJECTIVE: 40 year old lady with known coronary artery disease presented with chest pain 10/30/16.  Catheterization demonstrated left sided disease with plan for CT surgery consultation versus high risk PCI.  CT surgery saw the patient yesterday. All concern for ongoing chest pain. Was not felt to have an indication for emergent bypass.   She continues with chest pain. She tells me today, in response to my questions today which I didn't ask yesterday as to whether she uses narcotics tonically. She admits to oxycodone  10 mg 4 times daily. Telemetry has demonstrated increasingly frequent ventricular ectopy  Past Medical History:  Diagnosis Date  . Anxiety   . Bipolar 1 disorder (HCC)   . Chronic pain   . Cocaine use   . Coronary artery disease    a. 03/2015 NSTEMI/PCI in setting of cocaine use - BMS to diagonal. b. NSTEMI 12/2015 s/o overlapping DES to Cx, residual mild RCA and LAD disease, 75% OM1.  . Depression   . Dyslipidemia   . Migraine   . Osteoarthritis   . Polysubstance abuse    a. tobacco/cocaine  . Scoliosis   . TIA (transient ischemic attack)     Scheduled Meds:  Scheduled Meds: . ALPRAZolam  0.5 mg Oral Daily  . amLODipine  2.5 mg Oral Daily  . aspirin  81 mg Oral Pre-Cath  . aspirin EC  81 mg Oral Daily  . atorvastatin  40 mg Oral Daily  . buPROPion  300 mg Oral Daily  . doxepin  10-20 mg Oral QHS  . escitalopram  20 mg Oral Daily  . oxyCODONE  10 mg Oral Q12H  . pantoprazole  40 mg Oral Daily  . ranolazine  500 mg Oral BID  . sodium chloride flush  3 mL Intravenous Q12H   Continuous Infusions: . sodium chloride Stopped (11/01/16 1039)  . sodium chloride Stopped (11/02/16 0100)  . heparin 1,300 Units/hr (11/02/16 0800)  . nitroGLYCERIN 15 mcg/min (11/02/16 0800)  . tirofiban 0.15 mcg/kg/min (11/02/16 0800)   sodium chloride, acetaminophen, fluticasone, LORazepam, magnesium hydroxide, morphine injection,  nitroGLYCERIN, ondansetron (ZOFRAN) IV, sodium chloride flush    PHYSICAL EXAM Vitals:   11/02/16 0755 11/02/16 0757 11/02/16 0800 11/02/16 0821  BP: (!) 139/100 (!) 137/107 (!) 127/103   Pulse: 71 71 79   Resp: (!) 23 18 17    Temp:    97.5 F (36.4 C)  TempSrc:    Oral  SpO2: 100% 90% 100%   Weight:      Height:       Well developed and nourished in no acute distress HENT normal Neck supple with JVP-flat Clear Regular rate and rhythm, no murmurs or gallops Abd-soft with active BS No Clubbing cyanosis edema Skin-warm and dry A & Oriented  Grossly normal sensory and motor function   Telemetry Personally reviewed  Sinus With intermittent ventricular couplets ECG personally reviewed no interval changes and T wave morphologies compared to yesterday   Intake/Output Summary (Last 24 hours) at 11/02/16 0838 Last data filed at 11/02/16 0800  Gross per 24 hour  Intake           1746.5 ml  Output              350 ml  Net           1396.5 ml    LABS: Basic Metabolic Panel:  Recent Labs Lab 10/30/16 2000 11/01/16 0636 11/02/16 0037  NA 137 141 141  K 3.5 3.8 3.7  CL 105 108 109  CO2 24 26 25   GLUCOSE 110* 103* 118*  BUN 6 5* 6  CREATININE 0.68 0.67 0.73  CALCIUM 9.1 8.7* 9.1   Cardiac Enzymes:  Recent Labs  10/31/16 0225 10/31/16 1036 11/01/16 1150  TROPONINI 0.09* 0.06* 0.04*   CBC:  Recent Labs Lab 10/30/16 2000 10/31/16 0225 11/01/16 0636 11/02/16 0037  WBC 5.8 5.2 4.1 5.2  NEUTROABS 3.9  --   --   --   HGB 13.3 13.6 12.2 11.9*  HCT 38.0 38.3 35.9* 34.6*  MCV 88.2 88.2 89.1 89.2  PLT 249 246 229 246   PROTIME: No results for input(s): LABPROT, INR in the last 72 hours. Liver Function Tests:  Recent Labs  10/30/16 2000 11/02/16 0037  AST 19 18  ALT 15 16  ALKPHOS 106 103  BILITOT 0.7 0.5  PROT 6.8 6.7  ALBUMIN 4.1 3.7   No results for input(s): LIPASE, AMYLASE in the last 72 hours. BNP: BNP (last 3 results) No results for  input(s): BNP in the last 8760 hours.  ProBNP (last 3 results) No results for input(s): PROBNP in the last 8760 hours.  D-Dimer:     ASSESSMENT AND PLAN:  Principal Problem:   Unstable angina (HCC) Active Problems:   Tobacco use disorder   CAD S/P percutaneous coronary angioplasty   Dyslipidemia   Chest pain   Hypertension   Depression   Chronic narcotic use Ventricular tachycardia-nonsustained   Concern regarding spasm yesterday was treated by benzodiazepines in the discontinuation of IV which is associated with vasospasm  I learned today that she is a chronic narcotic user of 2 years duration and of mild-moderate dosage. Hence, we will change her morphine to MS Contin.  We also await assessment of finding surgery given her antecedent Plavix.  For now we'll continue heparin and tirofaban  as well as nitroglycerin    Signed, Sherryl MangesSteven Vito Beg MD  11/02/2016

## 2016-11-02 NOTE — Progress Notes (Signed)
ANTICOAGULATION CONSULT NOTE - Follow Up Consult  Pharmacy Consult Tirofiban infusion / Heparin infusion  Indication: chest pain/ACS  Allergies  Allergen Reactions  . Ranexa [Ranolazine] Nausea Only    Dizziness (only the 1,000 mg dose has this effect)    Patient Measurements: Height: 5\' 2"  (157.5 cm) Weight: 158 lb 4.6 oz (71.8 kg) IBW/kg (Calculated) : 50.1 Heparin Dosing Weight: 65 kg  Vital Signs: Temp: 97.7 F (36.5 C) (11/19 0030) Temp Source: Oral (11/19 0030) BP: 127/62 (11/19 0030) Pulse Rate: 74 (11/19 0030)  Labs:  Recent Labs  10/30/16 2000  10/31/16 0225 10/31/16 1036 11/01/16 0636 11/01/16 1150 11/01/16 1623 11/02/16 0037  HGB 13.3  --  13.6  --  12.2  --   --  11.9*  HCT 38.0  --  38.3  --  35.9*  --   --  34.6*  PLT 249  --  246  --  229  --   --  246  HEPARINUNFRC  --   < >  --  0.25*  --   --  0.14* 0.21*  CREATININE 0.68  --   --   --  0.67  --   --   --   TROPONINI 0.03*  < > 0.09* 0.06*  --  0.04*  --   --   < > = values in this interval not displayed.  Estimated Creatinine Clearance: 86.8 mL/min (by C-G formula based on SCr of 0.67 mg/dL).  Assessment: 40 yo f presenting with CP and started on IV heparin infusion for r/o ACS.  Now s/p cardiac cath, revealed significant 2-vessel CAD - with instent restenosis on LCX stent from 1/18 - discussion of CABG v PCI.  Initiated Tirofiban bolus & infusion  in the cath lab 11/17.  CBC stable, ongoing CP and new ECG changes> restart heparin drip as well.  This morning her heparin level is subtherapeutic on 1100 units/hr. No bleeding noted.  PMH: CAD, HTN, Hx of cocaine   Goal of Therapy:  HL 0.3-0.5 Monitor platelets by anticoagulation protocol: Yes   Plan:  Tirofiban infusion 0.15 mcg/kg/min  Monitor PLTC daily Increase Heparin drip to 1300 units/hr  Check 6hr HL and daily  Pollyann SamplesAndy Carlyle Mcelrath, PharmD, BCPS 11/02/2016, 1:44 AM Pager: [phone]

## 2016-11-02 NOTE — Progress Notes (Signed)
ANTICOAGULATION CONSULT NOTE - Follow Up Consult  Pharmacy Consult Tirofiban infusion / Heparin infusion  Indication: chest pain/ACS  Allergies  Allergen Reactions  . Ranexa [Ranolazine] Nausea Only    Dizziness (only the 1,000 mg dose has this effect)    Patient Measurements: Height: 5\' 2"  (157.5 cm) Weight: 158 lb 4.6 oz (71.8 kg) IBW/kg (Calculated) : 50.1 Heparin Dosing Weight: 65 kg  Vital Signs: Temp: 97.5 F (36.4 C) (11/19 0821) Temp Source: Oral (11/19 0821) BP: 113/76 (11/19 0930) Pulse Rate: 82 (11/19 0930)  Labs:  Recent Labs  10/30/16 2000  10/31/16 0225 10/31/16 1036 11/01/16 0636 11/01/16 1150 11/01/16 1623 11/02/16 0037 11/02/16 0827  HGB 13.3  --  13.6  --  12.2  --   --  11.9*  --   HCT 38.0  --  38.3  --  35.9*  --   --  34.6*  --   PLT 249  --  246  --  229  --   --  246  --   HEPARINUNFRC  --   < >  --  0.25*  --   --  0.14* 0.21* 0.34  CREATININE 0.68  --   --   --  0.67  --   --  0.73  --   TROPONINI 0.03*  < > 0.09* 0.06*  --  0.04*  --   --  <0.03  < > = values in this interval not displayed.  Estimated Creatinine Clearance: 86.8 mL/min (by C-G formula based on SCr of 0.73 mg/dL).  Assessment: 40 yo f presenting with CP and started on IV heparin infusion for r/o ACS.  Now s/p cardiac cath, revealed significant 2-vessel CAD - with instent restenosis on LCX stent from 1/18 - discussion of CABG v PCI.  Initiated Tirofiban bolus & infusion  in the cath lab 11/17.  CBC stable, ongoing CP and new ECG changes> restart heparin drip as well. Heparin drip 1300 uts/hr HL 0.34 at goal.  CBC stable no bleeding noted  PMH: CAD, HTN, Hx of cocaine   Goal of Therapy:  HL 0.3-0.5 Monitor platelets by anticoagulation protocol: Yes   Plan:  Tirofiban infusion 0.15 mcg/kg/min  Monitor PLTC daily Heparin drip 1300 uts/hr  Daily CBC, HL  Leota SauersLisa Lealer Marsland Pharm.D. CPP, BCPS Clinical Pharmacist [phone] 11/02/2016 10:25 AM

## 2016-11-02 NOTE — Progress Notes (Signed)
Pt having severe CP. RN administered 4 mg morphine and increased Nitro gtt to 18. Pt also having EKG changes with paired PVCs. MD aware.

## 2016-11-02 NOTE — Progress Notes (Signed)
PROGRESS NOTE    Christina Morrison  ZOX:[id number] DOB: 01-25-1976 DOA: 10/30/2016 PCP: Pearson GrippeJames Kim, MD    Brief Narrative: Christina Morrison is a 40 y.o.  female with CAD status post stenting in April 2016 in the setting of cocaine abuse and hypertension. She presented with chest pain and found to have an NSTEMI. She is s/p cath and plan going forward is for CABG.   Assessment & Plan:   Principal Problem:   Unstable angina (HCC) Active Problems:   Tobacco use disorder   CAD S/P percutaneous coronary angioplasty   Dyslipidemia   Chest pain   Hypertension   Depression   NSTEMI CAD Patient is s/p cath with multivessel disease on 11/18, continued chest pain and t-wave inversions. -cardiology recommendations: CABG, continue nitro gtt, continue heparin gtt.  -morphine prn -continue atorvastatin -TCTS recommendations: PCI vs CABG in a few days; started on tirofiban gtt  Hypertension On nitro gtt. Blood pressure on low side today -continue amlodipine  Tobacco abuse Drug abuse Cessation  Depression -continue Lexapro -continue Wellbutrin   DVT prophylaxis: Heparin gtt Code Status: Full code Family Communication: None at bedside Disposition Plan: Pending cardiac workup/management   Consultants:   Cardiology  Cardiothoracic surgery  Procedures:  Cardiac cath (11/18)  Antimicrobials:   None    Subjective: Chest pain last night. States she is not having any currently  Objective: Vitals:   11/02/16 0645 11/02/16 0700 11/02/16 0715 11/02/16 0730  BP: 105/71 106/75 99/70 94/64   Pulse: 80 77 79 79  Resp: 15 16 15 15   Temp:      TempSrc:      SpO2: 100% 100% 100% 100%  Weight:      Height:        Intake/Output Summary (Last 24 hours) at 11/02/16 0752 Last data filed at 11/02/16 0700  Gross per 24 hour  Intake           1861.1 ml  Output              350 ml  Net           1511.1 ml   Filed Weights   10/30/16 2301 10/31/16 0251  Weight: 61.2 kg (135 lb)  71.8 kg (158 lb 4.6 oz)    Examination:  General exam: Appears calm and comfortable Respiratory system: Clear to auscultation. Respiratory effort normal. Cardiovascular system: S1 & S2 heard, RRR. No murmurs, rubs, gallops or clicks. Gastrointestinal system: Abdomen is nondistended, soft and nontender. Normal bowel sounds heard. Central nervous system: Alert and oriented. No focal neurological deficits. Extremities: No edema. No calf tenderness Skin: No cyanosis. No rashes Psychiatry: Judgement and insight appear normal. Mood & affect appropriate.     Data Reviewed: I have personally reviewed following labs and imaging studies  CBC:  Recent Labs Lab 10/30/16 2000 10/31/16 0225 11/01/16 0636 11/02/16 0037  WBC 5.8 5.2 4.1 5.2  NEUTROABS 3.9  --   --   --   HGB 13.3 13.6 12.2 11.9*  HCT 38.0 38.3 35.9* 34.6*  MCV 88.2 88.2 89.1 89.2  PLT 249 246 229 246   Basic Metabolic Panel:  Recent Labs Lab 10/30/16 2000 11/01/16 0636 11/02/16 0037  NA 137 141 141  K 3.5 3.8 3.7  CL 105 108 109  CO2 24 26 25   GLUCOSE 110* 103* 118*  BUN 6 5* 6  CREATININE 0.68 0.67 0.73  CALCIUM 9.1 8.7* 9.1   GFR: Estimated Creatinine Clearance: 86.8 mL/min (by C-G formula based on  SCr of 0.73 mg/dL). Liver Function Tests:  Recent Labs Lab 10/30/16 2000 11/02/16 0037  AST 19 18  ALT 15 16  ALKPHOS 106 103  BILITOT 0.7 0.5  PROT 6.8 6.7  ALBUMIN 4.1 3.7   No results for input(s): LIPASE, AMYLASE in the last 168 hours. No results for input(s): AMMONIA in the last 168 hours. Coagulation Profile: No results for input(s): INR, PROTIME in the last 168 hours. Cardiac Enzymes:  Recent Labs Lab 10/30/16 2000 10/31/16 0023 10/31/16 0225 10/31/16 1036 11/01/16 1150  TROPONINI 0.03* 0.09* 0.09* 0.06* 0.04*   BNP (last 3 results) No results for input(s): PROBNP in the last 8760 hours. HbA1C: No results for input(s): HGBA1C in the last 72 hours. CBG:  Recent Labs Lab  10/31/16 0821  GLUCAP 113*   Lipid Profile: No results for input(s): CHOL, HDL, LDLCALC, TRIG, CHOLHDL, LDLDIRECT in the last 72 hours. Thyroid Function Tests: No results for input(s): TSH, T4TOTAL, FREET4, T3FREE, THYROIDAB in the last 72 hours. Anemia Panel: No results for input(s): VITAMINB12, FOLATE, FERRITIN, TIBC, IRON, RETICCTPCT in the last 72 hours. Sepsis Labs: No results for input(s): PROCALCITON, LATICACIDVEN in the last 168 hours.  Recent Results (from the past 240 hour(s))  MRSA PCR Screening     Status: None   Collection Time: 10/31/16  3:13 AM  Result Value Ref Range Status   MRSA by PCR NEGATIVE NEGATIVE Final    Comment:        The GeneXpert MRSA Assay (FDA approved for NASAL specimens only), is one component of a comprehensive MRSA colonization surveillance program. It is not intended to diagnose MRSA infection nor to guide or monitor treatment for MRSA infections.          Radiology Studies: No results found.      Scheduled Meds: . ALPRAZolam  0.5 mg Oral Daily  . amLODipine  2.5 mg Oral Daily  . aspirin  81 mg Oral Pre-Cath  . aspirin EC  81 mg Oral Daily  . atorvastatin  40 mg Oral Daily  . buPROPion  300 mg Oral Daily  . doxepin  10-20 mg Oral QHS  . escitalopram  20 mg Oral Daily  . pantoprazole  40 mg Oral Daily  . ranolazine  500 mg Oral BID  . sodium chloride flush  3 mL Intravenous Q12H   Continuous Infusions: . sodium chloride Stopped (11/01/16 1039)  . sodium chloride Stopped (11/02/16 0100)  . heparin 1,300 Units/hr (11/02/16 0600)  . nitroGLYCERIN 15 mcg/min (11/02/16 0600)  . tirofiban 0.15 mcg/kg/min (11/02/16 0600)     LOS: 2 days     Jacquelin Hawkingalph Chani Ghanem [facility] 11/02/2016, 7:52 AM Pager: [phone]  If 7PM-7AM, please contact night-coverage [email or web address] Password [email or web address] 11/02/2016, 7:52 AM

## 2016-11-02 NOTE — Progress Notes (Signed)
      [address]       [place] [id number]             [phone]     CARDIOTHORACIC SURGERY PROGRESS NOTE  2 Days Post-Op  S/P Procedure(s) (LRB): Left Heart Cath and Coronary Angiography (N/A) Intravascular Ultrasound/IVUS (N/A)  Subjective: Patient has continued to have episodes of chest pain that resolve w/ NTG and/or sedation.  No SOB  Objective: Vital signs in last 24 hours: Temp:  [97.5 F (36.4 C)-98.3 F (36.8 C)] 97.5 F (36.4 C) (11/19 0821) Pulse Rate:  [71-150] 87 (11/19 1100) Cardiac Rhythm: Normal sinus rhythm (11/19 0740) Resp:  [12-33] 21 (11/19 1100) BP: (79-147)/(49-107) 89/56 (11/19 1100) SpO2:  [81 %-100 %] 99 % (11/19 1100)  Physical Exam:  Rhythm:   sinus  Breath sounds: clear  Heart sounds:  RRR  Incisions:  n/a  Abdomen:  soft  Extremities:  warm   Intake/Output from previous day: 11/18 0701 - 11/19 0700 In: 1861.1 [P.O.:1200; I.V.:661.1] Out: 350 [Urine:350] Intake/Output this shift: Total I/O In: 572.7 [P.O.:480; I.V.:92.7] Out: -   Lab Results:  Recent Labs  11/01/16 0636 11/02/16 0037  WBC 4.1 5.2  HGB 12.2 11.9*  HCT 35.9* 34.6*  PLT 229 246   BMET:  Recent Labs  11/01/16 0636 11/02/16 0037  NA 141 141  K 3.8 3.7  CL 108 109  CO2 26 25  GLUCOSE 103* 118*  BUN 5* 6  CREATININE 0.67 0.73  CALCIUM 8.7* 9.1    CBG (last 3)   Recent Labs  10/31/16 0821  GLUCAP 113*   PT/INR:  No results for input(s): LABPROT, INR in the last 72 hours.  CXR:  CHEST  2 VIEW  COMPARISON:  01/12/2016  FINDINGS: The heart size and mediastinal contours are within normal limits. Both lungs are clear. The visualized skeletal structures are unremarkable.  IMPRESSION: No active cardiopulmonary disease.   Electronically Signed   By: Tollie Ethavid  Kwon M.D.   On: 10/30/2016 19:48  Assessment/Plan: S/P Procedure(s) (LRB): Left Heart Cath and Coronary Angiography (N/A) Intravascular Ultrasound/IVUS  (N/A)  Patient continues to have episodes of chest pain c/w unstable angina.  Troponin levels remain low.  She ate breakfast, remains on tirofiban and heparin, and last took Plavix on Friday 11/17  Will plan CABG 1st case tomorrow morning.  Stop Tirofiban at 3am.  Discussed w/ patient.  She understands the plan and agrees.  All questions answered.  I spent in excess of 15 minutes during the conduct of this hospital encounter and >50% of this time involved direct face-to-face encounter with the patient for counseling and/or coordination of their care.   Purcell Nailslarence H Owen, MD 11/02/2016 11:22 AM

## 2016-11-03 ENCOUNTER — Encounter (HOSPITAL_COMMUNITY)
Admission: EM | Disposition: A | Discharge status: Home Health | Payer: Self-pay | Source: Home / Self Care | Attending: Thoracic Surgery (Cardiothoracic Vascular Surgery)

## 2016-11-03 ENCOUNTER — Inpatient Hospital Stay (HOSPITAL_COMMUNITY): Payer: Medicaid Other

## 2016-11-03 ENCOUNTER — Other Ambulatory Visit (HOSPITAL_COMMUNITY): Payer: Medicaid Other

## 2016-11-03 ENCOUNTER — Inpatient Hospital Stay (HOSPITAL_COMMUNITY): Payer: Medicaid Other | Admitting: Certified Registered Nurse Anesthetist

## 2016-11-03 ENCOUNTER — Inpatient Hospital Stay (HOSPITAL_COMMUNITY): Payer: Medicaid Other | Admitting: Certified Registered"

## 2016-11-03 ENCOUNTER — Encounter (HOSPITAL_COMMUNITY): Payer: Self-pay | Admitting: Certified Registered Nurse Anesthetist

## 2016-11-03 ENCOUNTER — Ambulatory Visit: Payer: Medicaid Other | Admitting: Physician Assistant

## 2016-11-03 DIAGNOSIS — I469 Cardiac arrest, cause unspecified: Secondary | ICD-10-CM

## 2016-11-03 DIAGNOSIS — R570 Cardiogenic shock: Secondary | ICD-10-CM

## 2016-11-03 DIAGNOSIS — Z951 Presence of aortocoronary bypass graft: Secondary | ICD-10-CM

## 2016-11-03 HISTORY — DX: Cardiac arrest, cause unspecified: I46.9

## 2016-11-03 HISTORY — DX: Presence of aortocoronary bypass graft: Z95.1

## 2016-11-03 HISTORY — PX: TEE WITHOUT CARDIOVERSION: SHX5443

## 2016-11-03 HISTORY — PX: CORONARY ARTERY BYPASS GRAFT: SHX141

## 2016-11-03 HISTORY — DX: Cardiogenic shock: R57.0

## 2016-11-03 LAB — POCT I-STAT 3, ART BLOOD GAS (G3+)
ACID-BASE DEFICIT: 3 mmol/L — AB (ref 0.0–2.0)
ACID-BASE DEFICIT: 4 mmol/L — AB (ref 0.0–2.0)
ACID-BASE DEFICIT: 9 mmol/L — AB (ref 0.0–2.0)
ACID-BASE EXCESS: 5 mmol/L — AB (ref 0.0–2.0)
Acid-Base Excess: 1 mmol/L (ref 0.0–2.0)
Acid-base deficit: 2 mmol/L (ref 0.0–2.0)
Acid-base deficit: 1 mmol/L (ref 0.0–2.0)
Acid-base deficit: 1 mmol/L (ref 0.0–2.0)
Acid-base deficit: 3 mmol/L — ABNORMAL HIGH (ref 0.0–2.0)
BICARBONATE: 24.8 mmol/L (ref 20.0–28.0)
BICARBONATE: 25.8 mmol/L (ref 20.0–28.0)
BICARBONATE: 24.7 mmol/L (ref 20.0–28.0)
BICARBONATE: 22.6 mmol/L (ref 20.0–28.0)
BICARBONATE: 16.4 mmol/L — AB (ref 20.0–28.0)
BICARBONATE: 26 mmol/L (ref 20.0–28.0)
BICARBONATE: 24.7 mmol/L (ref 20.0–28.0)
Bicarbonate: 25 mmol/L (ref 20.0–28.0)
Bicarbonate: 31 mmol/L — ABNORMAL HIGH (ref 20.0–28.0)
O2 SAT: 100 %
O2 SAT: 100 %
O2 SAT: 96 %
O2 SAT: 99 %
O2 SAT: 99 %
O2 Saturation: 100 %
O2 Saturation: 100 %
O2 Saturation: 100 %
O2 Saturation: 100 %
PCO2 ART: 48.8 mmHg — AB (ref 32.0–48.0)
PCO2 ART: 43.7 mmHg (ref 32.0–48.0)
PCO2 ART: 61.9 mmHg — AB (ref 32.0–48.0)
PH ART: 7.313 — AB (ref 7.350–7.450)
PH ART: 7.379 (ref 7.350–7.450)
PH ART: 7.324 — AB (ref 7.350–7.450)
PH ART: 7.218 — AB (ref 7.350–7.450)
PH ART: 7.441 (ref 7.350–7.450)
PO2 ART: 390 mmHg — AB (ref 83.0–108.0)
PO2 ART: 94 mmHg (ref 83.0–108.0)
PO2 ART: 135 mmHg — AB (ref 83.0–108.0)
PO2 ART: 173 mmHg — AB (ref 83.0–108.0)
PO2 ART: 393 mmHg — AB (ref 83.0–108.0)
Patient temperature: 36.1
Patient temperature: 36.3
TCO2: 26 mmol/L (ref 0–100)
TCO2: 27 mmol/L (ref 0–100)
TCO2: 27 mmol/L (ref 0–100)
TCO2: 27 mmol/L (ref 0–100)
TCO2: 24 mmol/L (ref 0–100)
TCO2: 17 mmol/L (ref 0–100)
TCO2: 28 mmol/L (ref 0–100)
TCO2: 27 mmol/L (ref 0–100)
TCO2: 32 mmol/L (ref 0–100)
pCO2 arterial: 50.1 mmHg — ABNORMAL HIGH (ref 32.0–48.0)
pCO2 arterial: 60.7 mmHg — ABNORMAL HIGH (ref 32.0–48.0)
pCO2 arterial: 48.2 mmHg — ABNORMAL HIGH (ref 32.0–48.0)
pCO2 arterial: 30.9 mmHg — ABNORMAL LOW (ref 32.0–48.0)
pCO2 arterial: 60.6 mmHg — ABNORMAL HIGH (ref 32.0–48.0)
pCO2 arterial: 44.3 mmHg (ref 32.0–48.0)
pH, Arterial: 7.306 — ABNORMAL LOW (ref 7.350–7.450)
pH, Arterial: 7.213 — ABNORMAL LOW (ref 7.350–7.450)
pH, Arterial: 7.275 — ABNORMAL LOW (ref 7.350–7.450)
pH, Arterial: 7.231 — ABNORMAL LOW (ref 7.350–7.450)
pO2, Arterial: 203 mmHg — ABNORMAL HIGH (ref 83.0–108.0)
pO2, Arterial: 423 mmHg — ABNORMAL HIGH (ref 83.0–108.0)
pO2, Arterial: 163 mmHg — ABNORMAL HIGH (ref 83.0–108.0)
pO2, Arterial: 158 mmHg — ABNORMAL HIGH (ref 83.0–108.0)

## 2016-11-03 LAB — BASIC METABOLIC PANEL
ANION GAP: 8 (ref 5–15)
BUN: 5 mg/dL — ABNORMAL LOW (ref 6–20)
CALCIUM: 8.8 mg/dL — AB (ref 8.9–10.3)
CO2: 25 mmol/L (ref 22–32)
CREATININE: 0.79 mg/dL (ref 0.44–1.00)
Chloride: 107 mmol/L (ref 101–111)
GLUCOSE: 106 mg/dL — AB (ref 65–99)
Potassium: 3.4 mmol/L — ABNORMAL LOW (ref 3.5–5.1)
Sodium: 140 mmol/L (ref 135–145)

## 2016-11-03 LAB — POCT I-STAT, CHEM 8
BUN: 7 mg/dL (ref 6–20)
BUN: 6 mg/dL (ref 6–20)
BUN: 5 mg/dL — AB (ref 6–20)
BUN: 5 mg/dL — ABNORMAL LOW (ref 6–20)
BUN: 4 mg/dL — AB (ref 6–20)
BUN: 4 mg/dL — AB (ref 6–20)
BUN: 3 mg/dL — AB (ref 6–20)
BUN: 5 mg/dL — AB (ref 6–20)
BUN: 6 mg/dL (ref 6–20)
BUN: 6 mg/dL (ref 6–20)
CALCIUM ION: 0.95 mmol/L — AB (ref 1.15–1.40)
CALCIUM ION: 1.01 mmol/L — AB (ref 1.15–1.40)
CALCIUM ION: 1.07 mmol/L — AB (ref 1.15–1.40)
CALCIUM ION: 1.09 mmol/L — AB (ref 1.15–1.40)
CALCIUM ION: 0.82 mmol/L — AB (ref 1.15–1.40)
CALCIUM ION: 0.96 mmol/L — AB (ref 1.15–1.40)
CHLORIDE: 104 mmol/L (ref 101–111)
CHLORIDE: 104 mmol/L (ref 101–111)
CHLORIDE: 101 mmol/L (ref 101–111)
CHLORIDE: 103 mmol/L (ref 101–111)
CHLORIDE: 104 mmol/L (ref 101–111)
CHLORIDE: 93 mmol/L — AB (ref 101–111)
CHLORIDE: 97 mmol/L — AB (ref 101–111)
CHLORIDE: 100 mmol/L — AB (ref 101–111)
CHLORIDE: 100 mmol/L — AB (ref 101–111)
CREATININE: 0.5 mg/dL (ref 0.44–1.00)
CREATININE: 0.4 mg/dL — AB (ref 0.44–1.00)
CREATININE: 0.4 mg/dL — AB (ref 0.44–1.00)
CREATININE: 0.5 mg/dL (ref 0.44–1.00)
CREATININE: 0.5 mg/dL (ref 0.44–1.00)
CREATININE: 0.7 mg/dL (ref 0.44–1.00)
CREATININE: 0.8 mg/dL (ref 0.44–1.00)
Calcium, Ion: 1.22 mmol/L (ref 1.15–1.40)
Calcium, Ion: 1.18 mmol/L (ref 1.15–1.40)
Calcium, Ion: 1.34 mmol/L (ref 1.15–1.40)
Calcium, Ion: 0.82 mmol/L — CL (ref 1.15–1.40)
Calcium, Ion: 0.86 mmol/L — CL (ref 1.15–1.40)
Chloride: 101 mmol/L (ref 101–111)
Chloride: 106 mmol/L (ref 101–111)
Creatinine, Ser: 0.7 mg/dL (ref 0.44–1.00)
Creatinine, Ser: 0.4 mg/dL — ABNORMAL LOW (ref 0.44–1.00)
Creatinine, Ser: 0.4 mg/dL — ABNORMAL LOW (ref 0.44–1.00)
Creatinine, Ser: 0.7 mg/dL (ref 0.44–1.00)
GLUCOSE: 103 mg/dL — AB (ref 65–99)
GLUCOSE: 130 mg/dL — AB (ref 65–99)
GLUCOSE: 95 mg/dL (ref 65–99)
GLUCOSE: 149 mg/dL — AB (ref 65–99)
GLUCOSE: 175 mg/dL — AB (ref 65–99)
GLUCOSE: 406 mg/dL — AB (ref 65–99)
GLUCOSE: 322 mg/dL — AB (ref 65–99)
GLUCOSE: 147 mg/dL — AB (ref 65–99)
Glucose, Bld: 116 mg/dL — ABNORMAL HIGH (ref 65–99)
Glucose, Bld: 421 mg/dL — ABNORMAL HIGH (ref 65–99)
Glucose, Bld: 461 mg/dL — ABNORMAL HIGH (ref 65–99)
HCT: 20 % — ABNORMAL LOW (ref 36.0–46.0)
HCT: 20 % — ABNORMAL LOW (ref 36.0–46.0)
HCT: 21 % — ABNORMAL LOW (ref 36.0–46.0)
HCT: 22 % — ABNORMAL LOW (ref 36.0–46.0)
HCT: 26 % — ABNORMAL LOW (ref 36.0–46.0)
HCT: 29 % — ABNORMAL LOW (ref 36.0–46.0)
HEMATOCRIT: 29 % — AB (ref 36.0–46.0)
HEMATOCRIT: 25 % — AB (ref 36.0–46.0)
HEMATOCRIT: 16 % — AB (ref 36.0–46.0)
HEMATOCRIT: 15 % — AB (ref 36.0–46.0)
HEMATOCRIT: 20 % — AB (ref 36.0–46.0)
HEMOGLOBIN: 9.9 g/dL — AB (ref 12.0–15.0)
HEMOGLOBIN: 8.5 g/dL — AB (ref 12.0–15.0)
HEMOGLOBIN: 7.5 g/dL — AB (ref 12.0–15.0)
HEMOGLOBIN: 5.4 g/dL — AB (ref 12.0–15.0)
HEMOGLOBIN: 9.9 g/dL — AB (ref 12.0–15.0)
Hemoglobin: 6.8 g/dL — CL (ref 12.0–15.0)
Hemoglobin: 6.8 g/dL — CL (ref 12.0–15.0)
Hemoglobin: 7.1 g/dL — ABNORMAL LOW (ref 12.0–15.0)
Hemoglobin: 5.1 g/dL — CL (ref 12.0–15.0)
Hemoglobin: 6.8 g/dL — CL (ref 12.0–15.0)
Hemoglobin: 8.8 g/dL — ABNORMAL LOW (ref 12.0–15.0)
POTASSIUM: 4.1 mmol/L (ref 3.5–5.1)
POTASSIUM: 4.1 mmol/L (ref 3.5–5.1)
POTASSIUM: 4.3 mmol/L (ref 3.5–5.1)
POTASSIUM: 5.2 mmol/L — AB (ref 3.5–5.1)
POTASSIUM: 3.9 mmol/L (ref 3.5–5.1)
POTASSIUM: 4.3 mmol/L (ref 3.5–5.1)
POTASSIUM: 5.8 mmol/L — AB (ref 3.5–5.1)
POTASSIUM: 2.9 mmol/L — AB (ref 3.5–5.1)
Potassium: 5.1 mmol/L (ref 3.5–5.1)
Potassium: 4.1 mmol/L (ref 3.5–5.1)
Potassium: 3.9 mmol/L (ref 3.5–5.1)
SODIUM: 141 mmol/L (ref 135–145)
SODIUM: 141 mmol/L (ref 135–145)
SODIUM: 145 mmol/L (ref 135–145)
SODIUM: 136 mmol/L (ref 135–145)
Sodium: 141 mmol/L (ref 135–145)
Sodium: 137 mmol/L (ref 135–145)
Sodium: 139 mmol/L (ref 135–145)
Sodium: 138 mmol/L (ref 135–145)
Sodium: 139 mmol/L (ref 135–145)
Sodium: 140 mmol/L (ref 135–145)
Sodium: 146 mmol/L — ABNORMAL HIGH (ref 135–145)
TCO2: 25 mmol/L (ref 0–100)
TCO2: 25 mmol/L (ref 0–100)
TCO2: 26 mmol/L (ref 0–100)
TCO2: 26 mmol/L (ref 0–100)
TCO2: 26 mmol/L (ref 0–100)
TCO2: 25 mmol/L (ref 0–100)
TCO2: 18 mmol/L (ref 0–100)
TCO2: 22 mmol/L (ref 0–100)
TCO2: 22 mmol/L (ref 0–100)
TCO2: 25 mmol/L (ref 0–100)
TCO2: 23 mmol/L (ref 0–100)

## 2016-11-03 LAB — CBC
HCT: 30.9 % — ABNORMAL LOW (ref 36.0–46.0)
HCT: 28.3 % — ABNORMAL LOW (ref 36.0–46.0)
HCT: 33.4 % — ABNORMAL LOW (ref 36.0–46.0)
HEMOGLOBIN: 9.8 g/dL — AB (ref 12.0–15.0)
HEMOGLOBIN: 11.7 g/dL — AB (ref 12.0–15.0)
Hemoglobin: 10.6 g/dL — ABNORMAL LOW (ref 12.0–15.0)
MCH: 30.5 pg (ref 26.0–34.0)
MCH: 31.1 pg (ref 26.0–34.0)
MCH: 30.6 pg (ref 26.0–34.0)
MCHC: 34.3 g/dL (ref 30.0–36.0)
MCHC: 34.6 g/dL (ref 30.0–36.0)
MCHC: 35 g/dL (ref 30.0–36.0)
MCV: 89 fL (ref 78.0–100.0)
MCV: 89.8 fL (ref 78.0–100.0)
MCV: 87.4 fL (ref 78.0–100.0)
PLATELETS: 226 10*3/uL (ref 150–400)
PLATELETS: 196 10*3/uL (ref 150–400)
Platelets: 151 10*3/uL (ref 150–400)
RBC: 3.47 MIL/uL — AB (ref 3.87–5.11)
RBC: 3.15 MIL/uL — AB (ref 3.87–5.11)
RBC: 3.82 MIL/uL — AB (ref 3.87–5.11)
RDW: 12.3 % (ref 11.5–15.5)
RDW: 12.6 % (ref 11.5–15.5)
RDW: 13.9 % (ref 11.5–15.5)
WBC: 5.3 10*3/uL (ref 4.0–10.5)
WBC: 15.7 10*3/uL — ABNORMAL HIGH (ref 4.0–10.5)
WBC: 20.5 10*3/uL — ABNORMAL HIGH (ref 4.0–10.5)

## 2016-11-03 LAB — HEMOGLOBIN A1C
HEMOGLOBIN A1C: 4.9 % (ref 4.8–5.6)
MEAN PLASMA GLUCOSE: 94 mg/dL

## 2016-11-03 LAB — GLUCOSE, CAPILLARY
Glucose-Capillary: 100 mg/dL — ABNORMAL HIGH (ref 65–99)
Glucose-Capillary: 114 mg/dL — ABNORMAL HIGH (ref 65–99)
Glucose-Capillary: 85 mg/dL (ref 65–99)
Glucose-Capillary: 89 mg/dL (ref 65–99)

## 2016-11-03 LAB — HEMOGLOBIN AND HEMATOCRIT, BLOOD
HCT: 21.2 % — ABNORMAL LOW (ref 36.0–46.0)
HEMATOCRIT: 21 % — AB (ref 36.0–46.0)
HEMATOCRIT: 28.7 % — AB (ref 36.0–46.0)
Hemoglobin: 7.3 g/dL — ABNORMAL LOW (ref 12.0–15.0)
Hemoglobin: 7.4 g/dL — ABNORMAL LOW (ref 12.0–15.0)
Hemoglobin: 9.9 g/dL — ABNORMAL LOW (ref 12.0–15.0)

## 2016-11-03 LAB — POCT I-STAT 4, (NA,K, GLUC, HGB,HCT)
GLUCOSE: 121 mg/dL — AB (ref 65–99)
GLUCOSE: 114 mg/dL — AB (ref 65–99)
HCT: 31 % — ABNORMAL LOW (ref 36.0–46.0)
HEMATOCRIT: 33 % — AB (ref 36.0–46.0)
Hemoglobin: 10.5 g/dL — ABNORMAL LOW (ref 12.0–15.0)
Hemoglobin: 11.2 g/dL — ABNORMAL LOW (ref 12.0–15.0)
POTASSIUM: 3.9 mmol/L (ref 3.5–5.1)
Potassium: 3.4 mmol/L — ABNORMAL LOW (ref 3.5–5.1)
SODIUM: 141 mmol/L (ref 135–145)
Sodium: 146 mmol/L — ABNORMAL HIGH (ref 135–145)

## 2016-11-03 LAB — PLATELET COUNT
PLATELETS: 167 10*3/uL (ref 150–400)
PLATELETS: 167 10*3/uL (ref 150–400)
PLATELETS: 103 10*3/uL — AB (ref 150–400)

## 2016-11-03 LAB — PREPARE RBC (CROSSMATCH)

## 2016-11-03 LAB — PROTIME-INR
INR: 1.27
PROTHROMBIN TIME: 15.9 s — AB (ref 11.4–15.2)

## 2016-11-03 LAB — DIC (DISSEMINATED INTRAVASCULAR COAGULATION) PANEL
APTT: 36 s (ref 24–36)
D DIMER QUANT: 1.51 ug{FEU}/mL — AB (ref 0.00–0.50)
INR: 1.44
PROTHROMBIN TIME: 17.6 s — AB (ref 11.4–15.2)

## 2016-11-03 LAB — DIC (DISSEMINATED INTRAVASCULAR COAGULATION)PANEL
Fibrinogen: 302 mg/dL (ref 210–475)
Platelets: 150 10*3/uL (ref 150–400)

## 2016-11-03 LAB — APTT: aPTT: 30 seconds (ref 24–36)

## 2016-11-03 LAB — HEPARIN LEVEL (UNFRACTIONATED): HEPARIN UNFRACTIONATED: 0.23 [IU]/mL — AB (ref 0.30–0.70)

## 2016-11-03 SURGERY — CORONARY ARTERY BYPASS GRAFTING (CABG)
Anesthesia: General | Site: Chest

## 2016-11-03 MED ORDER — LACTATED RINGERS IV SOLN: 500.0000 mL | Freq: Once | INTRAVENOUS | Status: DC | PRN

## 2016-11-03 MED ORDER — MIDAZOLAM HCL 2 MG/2ML IJ SOLN
2.0000 mg | INTRAMUSCULAR | Status: DC | PRN
  Administered 2016-11-03: 4 mg via INTRAVENOUS
  Administered 2016-11-04 (×2): 2 mg via INTRAVENOUS
  Filled 2016-11-03 (×2): qty 2

## 2016-11-03 MED ORDER — HEPARIN SODIUM (PORCINE) 1000 UNIT/ML IJ SOLN
INTRAMUSCULAR | Status: DC | PRN
  Administered 2016-11-03: 13000 [IU] via INTRAVENOUS
  Administered 2016-11-03: 3000 [IU] via INTRAVENOUS
  Administered 2016-11-03: 5000 [IU] via INTRAVENOUS

## 2016-11-03 MED ORDER — ORAL CARE MOUTH RINSE
15.0000 mL | OROMUCOSAL | Status: DC
  Administered 2016-11-04 – 2016-11-07 (×34): 15 mL via OROMUCOSAL

## 2016-11-03 MED ORDER — SODIUM CHLORIDE 0.9 % IV SOLN
INTRAVENOUS | Status: DC
  Administered 2016-11-03 – 2016-11-05 (×4): via INTRAVENOUS

## 2016-11-03 MED ORDER — MIDAZOLAM HCL 5 MG/5ML IJ SOLN
INTRAMUSCULAR | Status: DC | PRN
  Administered 2016-11-03: 4 mg via INTRAVENOUS
  Administered 2016-11-03 (×3): 2 mg via INTRAVENOUS

## 2016-11-03 MED ORDER — MIDAZOLAM HCL 2 MG/2ML IJ SOLN
INTRAMUSCULAR | Status: AC
  Filled 2016-11-03: qty 4

## 2016-11-03 MED ORDER — ONDANSETRON HCL 4 MG/2ML IJ SOLN: 4.0000 mg | Freq: Four times a day (QID) | INTRAMUSCULAR | Status: DC | PRN

## 2016-11-03 MED ORDER — MUPIROCIN 2 % EX OINT
1.0000 "application " | TOPICAL_OINTMENT | Freq: Two times a day (BID) | CUTANEOUS | Status: DC
  Administered 2016-11-03 – 2016-11-06 (×7): 1 via TOPICAL
  Filled 2016-11-03: qty 22

## 2016-11-03 MED ORDER — POTASSIUM CHLORIDE 10 MEQ/50ML IV SOLN
10.0000 meq | INTRAVENOUS | Status: AC | PRN
  Administered 2016-11-04 (×3): 10 meq via INTRAVENOUS
  Filled 2016-11-03 (×3): qty 50

## 2016-11-03 MED ORDER — LACTATED RINGERS IV SOLN
INTRAVENOUS | Status: DC | PRN
  Administered 2016-11-03: 08:00:00 via INTRAVENOUS

## 2016-11-03 MED ORDER — SODIUM CHLORIDE 0.9 % IV SOLN
INTRAVENOUS | Status: AC
  Administered 2016-11-03: 22:00:00 via INTRAVENOUS

## 2016-11-03 MED ORDER — PROPOFOL 10 MG/ML IV BOLUS
INTRAVENOUS | Status: AC
  Filled 2016-11-03: qty 20

## 2016-11-03 MED ORDER — DEXTROSE 5 % IV SOLN
1.5000 g | Freq: Two times a day (BID) | INTRAVENOUS | Status: DC
  Administered 2016-11-03: 1.5 g via INTRAVENOUS
  Filled 2016-11-03 (×2): qty 1.5

## 2016-11-03 MED ORDER — MIDAZOLAM HCL 2 MG/2ML IJ SOLN
INTRAMUSCULAR | Status: AC
  Filled 2016-11-03: qty 2

## 2016-11-03 MED ORDER — LACTATED RINGERS IV SOLN
INTRAVENOUS | Status: DC
  Administered 2016-11-03 – 2016-11-07 (×3): via INTRAVENOUS

## 2016-11-03 MED ORDER — EPINEPHRINE PF 1 MG/ML IJ SOLN
INTRAVENOUS | Status: DC | PRN
  Administered 2016-11-03: 4 ug/min via INTRAVENOUS

## 2016-11-03 MED ORDER — SODIUM CHLORIDE 0.9 % IV SOLN
INTRAVENOUS | Status: DC | PRN
  Administered 2016-11-03: 500 mL

## 2016-11-03 MED ORDER — MORPHINE SULFATE (PF) 2 MG/ML IV SOLN: 1.0000 mg | INTRAVENOUS | Status: DC | PRN

## 2016-11-03 MED ORDER — DEXTROSE 5 % IV SOLN
0.0000 ug/min | INTRAVENOUS | Status: DC
  Filled 2016-11-03: qty 4

## 2016-11-03 MED ORDER — METOPROLOL TARTRATE 12.5 MG HALF TABLET: 12.5000 mg | ORAL_TABLET | Freq: Two times a day (BID) | ORAL | Status: DC

## 2016-11-03 MED ORDER — EPINEPHRINE PF 1 MG/10ML IJ SOSY
PREFILLED_SYRINGE | INTRAMUSCULAR | Status: AC
  Filled 2016-11-03: qty 40

## 2016-11-03 MED ORDER — NOREPINEPHRINE BITARTRATE 1 MG/ML IV SOLN
0.0000 ug/min | INTRAVENOUS | Status: DC
  Filled 2016-11-03 (×2): qty 4

## 2016-11-03 MED ORDER — ASPIRIN EC 325 MG PO TBEC: 325.0000 mg | DELAYED_RELEASE_TABLET | Freq: Every day | ORAL | Status: DC

## 2016-11-03 MED ORDER — SUCCINYLCHOLINE CHLORIDE 20 MG/ML IJ SOLN
INTRAMUSCULAR | Status: DC | PRN
  Administered 2016-11-03: 120 mg via INTRAVENOUS

## 2016-11-03 MED ORDER — MILRINONE LACTATE IN DEXTROSE 20-5 MG/100ML-% IV SOLN
0.1250 ug/kg/min | INTRAVENOUS | Status: AC
  Administered 2016-11-03: .375 ug/kg/min via INTRAVENOUS
  Filled 2016-11-03: qty 100

## 2016-11-03 MED ORDER — NOREPINEPHRINE BITARTRATE 1 MG/ML IV SOLN
0.0000 ug/min | INTRAVENOUS | Status: DC
  Filled 2016-11-03 (×3): qty 4

## 2016-11-03 MED ORDER — FUROSEMIDE 10 MG/ML IJ SOLN
INTRAMUSCULAR | Status: AC
  Filled 2016-11-03: qty 4

## 2016-11-03 MED ORDER — MAGNESIUM SULFATE 4 GM/100ML IV SOLN
4.0000 g | Freq: Once | INTRAVENOUS | Status: AC
  Administered 2016-11-03: 4 g via INTRAVENOUS
  Filled 2016-11-03: qty 100

## 2016-11-03 MED ORDER — DEXTROSE 5 % IV SOLN
0.0000 ug/min | INTRAVENOUS | Status: DC
  Administered 2016-11-04: 45 ug/min via INTRAVENOUS
  Administered 2016-11-04: 40 ug/min via INTRAVENOUS
  Administered 2016-11-04: 50 ug/min via INTRAVENOUS
  Administered 2016-11-05: 45 ug/min via INTRAVENOUS
  Filled 2016-11-03 (×7): qty 2

## 2016-11-03 MED ORDER — PHENYLEPHRINE HCL 10 MG/ML IJ SOLN
0.0000 ug/min | INTRAVENOUS | Status: DC
  Filled 2016-11-03: qty 2

## 2016-11-03 MED ORDER — SODIUM CHLORIDE 0.9 % IV SOLN: Freq: Once | INTRAVENOUS | Status: DC

## 2016-11-03 MED ORDER — PHENYLEPHRINE HCL 10 MG/ML IJ SOLN
INTRAMUSCULAR | Status: DC | PRN
  Administered 2016-11-03 (×4): 120 ug via INTRAVENOUS

## 2016-11-03 MED ORDER — INSULIN REGULAR BOLUS VIA INFUSION
0.0000 [IU] | Freq: Three times a day (TID) | INTRAVENOUS | Status: DC
  Filled 2016-11-03: qty 10

## 2016-11-03 MED ORDER — SODIUM CHLORIDE 0.9 % IV SOLN: 250.0000 mL | INTRAVENOUS | Status: DC

## 2016-11-03 MED ORDER — SODIUM CHLORIDE 0.9 % IV SOLN
INTRAVENOUS | Status: DC
  Administered 2016-11-04: 1.7 [IU]/h via INTRAVENOUS
  Administered 2016-11-05: 08:00:00 via INTRAVENOUS
  Filled 2016-11-03 (×4): qty 2.5

## 2016-11-03 MED ORDER — METOPROLOL TARTRATE 5 MG/5ML IV SOLN: 2.5000 mg | INTRAVENOUS | Status: DC | PRN

## 2016-11-03 MED ORDER — FENTANYL CITRATE (PF) 250 MCG/5ML IJ SOLN
INTRAMUSCULAR | Status: AC
  Filled 2016-11-03: qty 25

## 2016-11-03 MED ORDER — EPINEPHRINE PF 1 MG/10ML IJ SOSY
PREFILLED_SYRINGE | INTRAMUSCULAR | Status: DC | PRN
  Administered 2016-11-03 (×3): 1 mg via INTRAVENOUS

## 2016-11-03 MED ORDER — SODIUM CHLORIDE 0.9 % IV SOLN
INTRAVENOUS | Status: DC | PRN
  Administered 2016-11-03: 1000 mL

## 2016-11-03 MED ORDER — FUROSEMIDE 10 MG/ML IJ SOLN
INTRAMUSCULAR | Status: DC | PRN
  Administered 2016-11-03: 40 mg via INTRAMUSCULAR

## 2016-11-03 MED ORDER — LACTATED RINGERS IV SOLN
INTRAVENOUS | Status: DC
  Administered 2016-11-03: 14:00:00 via INTRAVENOUS

## 2016-11-03 MED ORDER — MIDAZOLAM HCL 2 MG/2ML IJ SOLN: 2.0000 mg | INTRAMUSCULAR | Status: DC | PRN

## 2016-11-03 MED ORDER — FENTANYL CITRATE (PF) 250 MCG/5ML IJ SOLN
INTRAMUSCULAR | Status: AC
  Filled 2016-11-03: qty 5

## 2016-11-03 MED ORDER — PROTAMINE SULFATE 10 MG/ML IV SOLN
INTRAVENOUS | Status: DC | PRN
  Administered 2016-11-03: 300 mg via INTRAVENOUS

## 2016-11-03 MED ORDER — ARTIFICIAL TEARS OP OINT
TOPICAL_OINTMENT | OPHTHALMIC | Status: DC | PRN
  Administered 2016-11-03: 1 via OPHTHALMIC

## 2016-11-03 MED ORDER — ACETAMINOPHEN 160 MG/5ML PO SOLN: 1000.0000 mg | Freq: Four times a day (QID) | ORAL | Status: DC

## 2016-11-03 MED ORDER — ASPIRIN 81 MG PO CHEW
324.0000 mg | CHEWABLE_TABLET | Freq: Every day | ORAL | Status: DC
  Administered 2016-11-04 – 2016-11-05 (×2): 324 mg
  Filled 2016-11-03 (×2): qty 4

## 2016-11-03 MED ORDER — CALCIUM CHLORIDE 10 % IV SOLN
INTRAVENOUS | Status: DC | PRN
  Administered 2016-11-03: 1000 mg via INTRAVENOUS

## 2016-11-03 MED ORDER — DEXMEDETOMIDINE HCL IN NACL 200 MCG/50ML IV SOLN
0.0000 ug/kg/h | INTRAVENOUS | Status: DC
  Administered 2016-11-03: 19:00:00 via INTRAVENOUS
  Administered 2016-11-03 (×2): 0.7 ug/kg/h via INTRAVENOUS
  Filled 2016-11-03 (×2): qty 50

## 2016-11-03 MED ORDER — PROPOFOL 10 MG/ML IV BOLUS
INTRAVENOUS | Status: DC | PRN
  Administered 2016-11-03: 100 mg via INTRAVENOUS
  Administered 2016-11-03: 30 mg via INTRAVENOUS

## 2016-11-03 MED ORDER — ALBUMIN HUMAN 5 % IV SOLN
INTRAVENOUS | Status: DC | PRN
  Administered 2016-11-03: 12:00:00 via INTRAVENOUS

## 2016-11-03 MED ORDER — HEPARIN (PORCINE) IN NACL 100-0.45 UNIT/ML-% IJ SOLN
500.0000 [IU]/h | INTRAMUSCULAR | Status: DC
  Administered 2016-11-04: 500 [IU]/h via INTRAVENOUS

## 2016-11-03 MED ORDER — SODIUM CHLORIDE 0.9 % IV SOLN
INTRAVENOUS | Status: DC
  Filled 2016-11-03: qty 2.5

## 2016-11-03 MED ORDER — SODIUM CHLORIDE 0.9 % IV SOLN
INTRAVENOUS | Status: DC
  Administered 2016-11-03: 14:00:00 via INTRAVENOUS

## 2016-11-03 MED ORDER — NITROGLYCERIN 0.2 MG/ML ON CALL CATH LAB
INTRAVENOUS | Status: DC | PRN
  Administered 2016-11-03 (×2): 20 ug via INTRAVENOUS

## 2016-11-03 MED ORDER — VASOPRESSIN 20 UNIT/ML IV SOLN
0.0400 [IU]/min | INTRAVENOUS | Status: DC | PRN
  Filled 2016-11-03: qty 2

## 2016-11-03 MED ORDER — ACETAMINOPHEN 160 MG/5ML PO SOLN: 650.0000 mg | Freq: Once | ORAL | Status: AC

## 2016-11-03 MED ORDER — ALBUMIN HUMAN 5 % IV SOLN
250.0000 mL | INTRAVENOUS | Status: AC | PRN
  Administered 2016-11-03 (×4): 250 mL via INTRAVENOUS
  Filled 2016-11-03 (×2): qty 250

## 2016-11-03 MED ORDER — FENTANYL CITRATE (PF) 250 MCG/5ML IJ SOLN
INTRAMUSCULAR | Status: DC | PRN
  Administered 2016-11-03: 50 ug via INTRAVENOUS
  Administered 2016-11-03 (×2): 100 ug via INTRAVENOUS
  Administered 2016-11-03: 50 ug via INTRAVENOUS
  Administered 2016-11-03 (×3): 100 ug via INTRAVENOUS
  Administered 2016-11-03: 150 ug via INTRAVENOUS
  Administered 2016-11-03: 100 ug via INTRAVENOUS
  Administered 2016-11-03 (×2): 150 ug via INTRAVENOUS
  Administered 2016-11-03: 100 ug via INTRAVENOUS
  Administered 2016-11-03: 50 ug via INTRAVENOUS
  Administered 2016-11-03 (×2): 100 ug via INTRAVENOUS

## 2016-11-03 MED ORDER — ACETAMINOPHEN 650 MG RE SUPP
650.0000 mg | Freq: Once | RECTAL | Status: AC
  Administered 2016-11-03: 650 mg via RECTAL

## 2016-11-03 MED ORDER — SODIUM CHLORIDE 0.45 % IV SOLN: INTRAVENOUS | Status: DC | PRN

## 2016-11-03 MED ORDER — POTASSIUM CHLORIDE 10 MEQ/50ML IV SOLN
10.0000 meq | INTRAVENOUS | Status: AC
  Administered 2016-11-03 – 2016-11-04 (×3): 10 meq via INTRAVENOUS

## 2016-11-03 MED ORDER — HEPARIN (PORCINE) IN NACL 100-0.45 UNIT/ML-% IJ SOLN
500.0000 [IU]/h | INTRAMUSCULAR | Status: DC
  Filled 2016-11-03: qty 250

## 2016-11-03 MED ORDER — FAMOTIDINE IN NACL 20-0.9 MG/50ML-% IV SOLN
20.0000 mg | Freq: Two times a day (BID) | INTRAVENOUS | Status: DC
  Administered 2016-11-03: 20 mg via INTRAVENOUS

## 2016-11-03 MED ORDER — SODIUM CHLORIDE 0.9 % IV SOLN
INTRAVENOUS | Status: DC
  Administered 2016-11-03: 13:00:00 via INTRAVENOUS

## 2016-11-03 MED ORDER — METOPROLOL TARTRATE 25 MG/10 ML ORAL SUSPENSION: 12.5000 mg | Freq: Two times a day (BID) | ORAL | Status: DC

## 2016-11-03 MED ORDER — SODIUM BICARBONATE 8.4 % IV SOLN
INTRAVENOUS | Status: AC
  Filled 2016-11-03: qty 200

## 2016-11-03 MED ORDER — PROTAMINE SULFATE 10 MG/ML IV SOLN
INTRAVENOUS | Status: DC | PRN
  Administered 2016-11-03: 160 mg via INTRAVENOUS

## 2016-11-03 MED ORDER — SODIUM CHLORIDE 0.9% FLUSH
3.0000 mL | Freq: Two times a day (BID) | INTRAVENOUS | Status: DC
  Administered 2016-11-04 – 2016-11-05 (×3): 3 mL via INTRAVENOUS

## 2016-11-03 MED ORDER — MORPHINE SULFATE (PF) 2 MG/ML IV SOLN
1.0000 mg | INTRAVENOUS | Status: DC | PRN
Start: 2016-11-03 — End: 2016-11-07

## 2016-11-03 MED ORDER — ACETAMINOPHEN 500 MG PO TABS: 1000.0000 mg | ORAL_TABLET | Freq: Four times a day (QID) | ORAL | Status: DC

## 2016-11-03 MED ORDER — VANCOMYCIN HCL IN DEXTROSE 1-5 GM/200ML-% IV SOLN
1000.0000 mg | Freq: Once | INTRAVENOUS | Status: DC
  Filled 2016-11-03: qty 200

## 2016-11-03 MED ORDER — ROCURONIUM BROMIDE 10 MG/ML (PF) SYRINGE
PREFILLED_SYRINGE | INTRAVENOUS | Status: DC | PRN
  Administered 2016-11-03: 30 mg via INTRAVENOUS
  Administered 2016-11-03 (×3): 50 mg via INTRAVENOUS
  Administered 2016-11-03: 20 mg via INTRAVENOUS

## 2016-11-03 MED ORDER — DEXMEDETOMIDINE HCL IN NACL 400 MCG/100ML IV SOLN
0.0000 ug/kg/h | INTRAVENOUS | Status: DC
  Administered 2016-11-04 (×3): 1.2 ug/kg/h via INTRAVENOUS
  Administered 2016-11-04: 0.7 ug/kg/h via INTRAVENOUS
  Administered 2016-11-04 – 2016-11-05 (×4): 1.2 ug/kg/h via INTRAVENOUS
  Filled 2016-11-03 (×3): qty 50
  Filled 2016-11-03 (×2): qty 100
  Filled 2016-11-03: qty 50
  Filled 2016-11-03 (×5): qty 100

## 2016-11-03 MED ORDER — SODIUM BICARBONATE 8.4 % IV SOLN
INTRAVENOUS | Status: DC | PRN
  Administered 2016-11-03: 100 meq via INTRAVENOUS

## 2016-11-03 MED ORDER — DOCUSATE SODIUM 100 MG PO CAPS: 200.0000 mg | ORAL_CAPSULE | Freq: Every day | ORAL | Status: DC

## 2016-11-03 MED ORDER — TRAMADOL HCL 50 MG PO TABS: 50.0000 mg | ORAL_TABLET | ORAL | Status: DC | PRN

## 2016-11-03 MED ORDER — CHLORHEXIDINE GLUCONATE 0.12 % MT SOLN
15.0000 mL | OROMUCOSAL | Status: AC
  Administered 2016-11-03: 15 mL via OROMUCOSAL

## 2016-11-03 MED ORDER — SODIUM CHLORIDE 0.9 % IJ SOLN
INTRAMUSCULAR | Status: DC | PRN
  Administered 2016-11-03: 16:00:00 via TOPICAL

## 2016-11-03 MED ORDER — ALBUMIN HUMAN 5 % IV SOLN
250.0000 mL | INTRAVENOUS | Status: AC | PRN
  Administered 2016-11-03 – 2016-11-04 (×4): 250 mL via INTRAVENOUS
  Filled 2016-11-03 (×3): qty 250

## 2016-11-03 MED ORDER — HEPARIN SODIUM (PORCINE) 1000 UNIT/ML IJ SOLN
INTRAMUSCULAR | Status: DC | PRN
  Administered 2016-11-03: 30000 [IU] via INTRAVENOUS

## 2016-11-03 MED ORDER — VANCOMYCIN HCL IN DEXTROSE 1-5 GM/200ML-% IV SOLN
1000.0000 mg | Freq: Two times a day (BID) | INTRAVENOUS | Status: DC
  Administered 2016-11-04 – 2016-11-07 (×8): 1000 mg via INTRAVENOUS
  Filled 2016-11-03 (×8): qty 200

## 2016-11-03 MED ORDER — MIDAZOLAM HCL 2 MG/2ML IJ SOLN
2.0000 mg | INTRAMUSCULAR | Status: DC | PRN
  Administered 2016-11-04 (×2): 2 mg via INTRAVENOUS
  Filled 2016-11-03 (×2): qty 2

## 2016-11-03 MED ORDER — SODIUM CHLORIDE 0.9% FLUSH: 3.0000 mL | INTRAVENOUS | Status: DC | PRN

## 2016-11-03 MED ORDER — MIDAZOLAM HCL 10 MG/2ML IJ SOLN
INTRAMUSCULAR | Status: AC
  Filled 2016-11-03: qty 2

## 2016-11-03 MED ORDER — HEPARIN BOLUS VIA INFUSION
500.0000 [IU] | INTRAVENOUS | Status: DC | PRN
  Administered 2016-11-04: 500 [IU] via INTRAVENOUS
  Administered 2016-11-04: 1000 [IU] via INTRAVENOUS
  Administered 2016-11-04: 500 [IU] via INTRAVENOUS
  Filled 2016-11-03: qty 1000

## 2016-11-03 MED ORDER — PHENYLEPHRINE HCL 10 MG/ML IJ SOLN
INTRAMUSCULAR | Status: DC | PRN
  Administered 2016-11-03: 25 ug/min via INTRAVENOUS

## 2016-11-03 MED ORDER — MORPHINE SULFATE (PF) 2 MG/ML IV SOLN
1.0000 mg | INTRAVENOUS | Status: AC | PRN
  Administered 2016-11-03 – 2016-11-04 (×4): 4 mg via INTRAVENOUS
  Filled 2016-11-03 (×4): qty 2

## 2016-11-03 MED ORDER — SODIUM CHLORIDE 0.9 % IJ SOLN
OROMUCOSAL | Status: DC | PRN
  Administered 2016-11-03 (×3): 4 mL via TOPICAL

## 2016-11-03 MED ORDER — TRANEXAMIC ACID 1000 MG/10ML IV SOLN
1.5000 mg/kg/h | INTRAVENOUS | Status: DC
  Filled 2016-11-03: qty 25

## 2016-11-03 MED ORDER — SODIUM CHLORIDE 0.9 % IJ SOLN
OROMUCOSAL | Status: DC | PRN
  Administered 2016-11-03: 16:00:00 via TOPICAL

## 2016-11-03 MED ORDER — ASPIRIN 325 MG PO TABS: 325.0000 mg | ORAL_TABLET | Freq: Every day | ORAL | Status: DC

## 2016-11-03 MED ORDER — LACTATED RINGERS IV SOLN
INTRAVENOUS | Status: DC
  Administered 2016-11-03 – 2016-11-06 (×3): via INTRAVENOUS

## 2016-11-03 MED ORDER — BISACODYL 10 MG RE SUPP: 10.0000 mg | Freq: Every day | RECTAL | Status: DC

## 2016-11-03 MED ORDER — SODIUM CHLORIDE 0.9% FLUSH
3.0000 mL | Freq: Two times a day (BID) | INTRAVENOUS | Status: DC
Start: 2016-11-04 — End: 2016-11-03

## 2016-11-03 MED ORDER — LACTATED RINGERS IV SOLN
INTRAVENOUS | Status: DC | PRN
  Administered 2016-11-03: 16:00:00 via INTRAVENOUS

## 2016-11-03 MED ORDER — 0.9 % SODIUM CHLORIDE (POUR BTL) OPTIME
TOPICAL | Status: DC | PRN
  Administered 2016-11-03: 5000 mL

## 2016-11-03 MED ORDER — EPINEPHRINE PF 1 MG/ML IJ SOLN
INTRAMUSCULAR | Status: DC | PRN
  Administered 2016-11-03: 3 ug/kg/min via INTRAVENOUS

## 2016-11-03 MED ORDER — 0.9 % SODIUM CHLORIDE (POUR BTL) OPTIME
TOPICAL | Status: DC | PRN
  Administered 2016-11-03: 6000 mL

## 2016-11-03 MED ORDER — FENTANYL CITRATE (PF) 100 MCG/2ML IJ SOLN
INTRAMUSCULAR | Status: AC
  Administered 2016-11-03: 100 ug
  Filled 2016-11-03: qty 2

## 2016-11-03 MED ORDER — VANCOMYCIN HCL IN DEXTROSE 1-5 GM/200ML-% IV SOLN: 1000.0000 mg | Freq: Once | INTRAVENOUS | Status: DC

## 2016-11-03 MED ORDER — MIDAZOLAM HCL 5 MG/5ML IJ SOLN
INTRAMUSCULAR | Status: DC | PRN
  Administered 2016-11-03: 2 mg via INTRAVENOUS

## 2016-11-03 MED ORDER — DEXTROSE 5 % IV SOLN: 1.5000 g | Freq: Two times a day (BID) | INTRAVENOUS | Status: DC

## 2016-11-03 MED ORDER — DOPAMINE-DEXTROSE 3.2-5 MG/ML-% IV SOLN
0.0000 ug/kg/min | INTRAVENOUS | Status: DC
  Filled 2016-11-03: qty 250

## 2016-11-03 MED ORDER — POTASSIUM CHLORIDE 10 MEQ/50ML IV SOLN
10.0000 meq | INTRAVENOUS | Status: DC
Start: 2016-11-03 — End: 2016-11-03
  Administered 2016-11-03: 10 meq via INTRAVENOUS

## 2016-11-03 MED ORDER — DEXTROSE 5 % IV SOLN
1.5000 g | Freq: Two times a day (BID) | INTRAVENOUS | Status: DC
  Administered 2016-11-04 – 2016-11-07 (×7): 1.5 g via INTRAVENOUS
  Filled 2016-11-03 (×7): qty 1.5

## 2016-11-03 MED ORDER — MILRINONE LACTATE IN DEXTROSE 20-5 MG/100ML-% IV SOLN
0.3750 ug/kg/min | INTRAVENOUS | Status: DC
  Administered 2016-11-03 – 2016-11-05 (×5): 0.375 ug/kg/min via INTRAVENOUS
  Filled 2016-11-03 (×4): qty 100

## 2016-11-03 MED ORDER — VASOPRESSIN 20 UNIT/ML IV SOLN
0.0300 [IU]/min | INTRAVENOUS | Status: DC
  Filled 2016-11-03: qty 2

## 2016-11-03 MED ORDER — CHLORHEXIDINE GLUCONATE 0.12% ORAL RINSE (MEDLINE KIT)
15.0000 mL | Freq: Two times a day (BID) | OROMUCOSAL | Status: DC
  Administered 2016-11-04 – 2016-11-06 (×6): 15 mL via OROMUCOSAL

## 2016-11-03 MED ORDER — NITROGLYCERIN IN D5W 200-5 MCG/ML-% IV SOLN: 0.0000 ug/min | INTRAVENOUS | Status: DC

## 2016-11-03 MED ORDER — HEPARIN (PORCINE) IN NACL 100-0.45 UNIT/ML-% IJ SOLN: 500.0000 [IU]/h | INTRAMUSCULAR | Status: DC

## 2016-11-03 MED ORDER — HEMOSTATIC AGENTS (NO CHARGE) OPTIME
TOPICAL | Status: DC | PRN
  Administered 2016-11-03: 1 via TOPICAL

## 2016-11-03 MED ORDER — BISACODYL 5 MG PO TBEC: 10.0000 mg | DELAYED_RELEASE_TABLET | Freq: Every day | ORAL | Status: DC

## 2016-11-03 MED ORDER — VANCOMYCIN HCL 1000 MG IV SOLR
INTRAVENOUS | Status: AC
  Filled 2016-11-03: qty 1000

## 2016-11-03 MED ORDER — ROCURONIUM BROMIDE 100 MG/10ML IV SOLN
INTRAVENOUS | Status: DC | PRN
  Administered 2016-11-03 (×2): 50 mg via INTRAVENOUS

## 2016-11-03 MED ORDER — OXYCODONE HCL 5 MG PO TABS: 5.0000 mg | ORAL_TABLET | ORAL | Status: DC | PRN

## 2016-11-03 MED FILL — Potassium Chloride Inj 2 mEq/ML: INTRAVENOUS | Qty: 40 | Status: AC

## 2016-11-03 MED FILL — Magnesium Sulfate Inj 50%: INTRAMUSCULAR | Qty: 10 | Status: AC

## 2016-11-03 MED FILL — Verapamil HCl IV Soln 2.5 MG/ML: INTRAVENOUS | Qty: 2 | Status: AC

## 2016-11-03 MED FILL — Heparin Sodium (Porcine) Inj 1000 Unit/ML: INTRAMUSCULAR | Qty: 30 | Status: AC

## 2016-11-03 SURGICAL SUPPLY — 125 items
APPLIER CLIP 9.375 SM OPEN (CLIP) ×2
APR CLP SM 9.3 20 MLT OPN (CLIP) ×1
BAG DECANTER FOR FLEXI CONT (MISCELLANEOUS) ×3 IMPLANT
BANDAGE ACE 4X5 VEL STRL LF (GAUZE/BANDAGES/DRESSINGS) ×4 IMPLANT
BANDAGE ACE 6X5 VEL STRL LF (GAUZE/BANDAGES/DRESSINGS) ×3 IMPLANT
BANDAGE ESMARK 6X9 LF (GAUZE/BANDAGES/DRESSINGS) IMPLANT
BASKET HEART (ORDER IN 25'S) (MISCELLANEOUS)
BASKET HEART (ORDER IN 25S) (MISCELLANEOUS) ×1 IMPLANT
BLADE STERNUM SYSTEM 6 (BLADE) ×1 IMPLANT
BLADE SURG ROTATE 9660 (MISCELLANEOUS) IMPLANT
BNDG CMPR 9X4 STRL LF SNTH (GAUZE/BANDAGES/DRESSINGS) ×1
BNDG CMPR 9X6 STRL LF SNTH (GAUZE/BANDAGES/DRESSINGS) ×1
BNDG ESMARK 4X9 LF (GAUZE/BANDAGES/DRESSINGS) ×1 IMPLANT
BNDG ESMARK 6X9 LF (GAUZE/BANDAGES/DRESSINGS) ×2
BNDG GAUZE ELAST 4 BULKY (GAUZE/BANDAGES/DRESSINGS) ×3 IMPLANT
CANISTER SUCTION 2500CC (MISCELLANEOUS) ×2 IMPLANT
CANNULA EZ GLIDE AORTIC 21FR (CANNULA) ×4 IMPLANT
CANNULA RT ANGLE VENOUS 32FR (CANNULA) IMPLANT
CANNULAE RT ANGLE VENOUS 32FR (CANNULA) ×2
CATH CPB KIT OWEN (MISCELLANEOUS) ×2 IMPLANT
CATH THORACIC 36FR (CATHETERS) ×1 IMPLANT
CLIP APPLIE 9.375 SM OPEN (CLIP) IMPLANT
CLIP TI MEDIUM 24 (CLIP) IMPLANT
CLIP TI WIDE RED SMALL 24 (CLIP) IMPLANT
CONN ST 1/4X3/8  BEN (MISCELLANEOUS) ×5
CONN ST 1/4X3/8 BEN (MISCELLANEOUS) IMPLANT
CONN Y 3/8X3/8X3/8  BEN (MISCELLANEOUS) ×1
CONN Y 3/8X3/8X3/8 BEN (MISCELLANEOUS) IMPLANT
CONNECTOR SHUNT 1.2MMX1.8MM (MISCELLANEOUS) ×1 IMPLANT
COVER PROBE W GEL 5X96 (DRAPES) ×1 IMPLANT
CRADLE DONUT ADULT HEAD (MISCELLANEOUS) ×2 IMPLANT
DRAIN CHANNEL 32F RND 10.7 FF (WOUND CARE) ×3 IMPLANT
DRAPE CARDIOVASCULAR INCISE (DRAPES)
DRAPE INCISE IOBAN 66X45 STRL (DRAPES) ×2 IMPLANT
DRAPE SLUSH/WARMER DISC (DRAPES) ×2 IMPLANT
DRAPE SRG 135X102X78XABS (DRAPES) ×1 IMPLANT
DRSG COVADERM 4X14 (GAUZE/BANDAGES/DRESSINGS) ×2 IMPLANT
ELECT BLADE 4.0 EZ CLEAN MEGAD (MISCELLANEOUS) ×2
ELECT REM PT RETURN 9FT ADLT (ELECTROSURGICAL) ×4
ELECTRODE BLDE 4.0 EZ CLN MEGD (MISCELLANEOUS) ×1 IMPLANT
ELECTRODE REM PT RTRN 9FT ADLT (ELECTROSURGICAL) ×2 IMPLANT
FELT TEFLON 1X6 (MISCELLANEOUS) ×5 IMPLANT
GAUZE SPONGE 4X4 12PLY STRL (GAUZE/BANDAGES/DRESSINGS) ×4 IMPLANT
GLOVE ORTHO TXT STRL SZ7.5 (GLOVE) ×4 IMPLANT
GOWN STRL REUS W/ TWL LRG LVL3 (GOWN DISPOSABLE) ×4 IMPLANT
GOWN STRL REUS W/TWL LRG LVL3 (GOWN DISPOSABLE) ×16
HEMOSTAT POWDER SURGIFOAM 1G (HEMOSTASIS) ×6 IMPLANT
INSERT FOGARTY XLG (MISCELLANEOUS) ×3 IMPLANT
KIT BASIN OR (CUSTOM PROCEDURE TRAY) ×2 IMPLANT
KIT DILATOR VASC 18G NDL (KITS) ×1 IMPLANT
KIT ROOM TURNOVER OR (KITS) ×2 IMPLANT
KIT SUCTION CATH 14FR (SUCTIONS) ×6 IMPLANT
KIT TOURNIQUET VASCULAR (KITS) ×5 IMPLANT
KIT VASOVIEW HEMOPRO VH 3000 (KITS) ×1 IMPLANT
LEAD PACING MYOCARDI (MISCELLANEOUS) ×2 IMPLANT
MARKER GRAFT CORONARY BYPASS (MISCELLANEOUS) ×3 IMPLANT
NS IRRIG 1000ML POUR BTL (IV SOLUTION) ×10 IMPLANT
PACK OPEN HEART (CUSTOM PROCEDURE TRAY) ×2 IMPLANT
PACK VENTRIC ASSIST CUSTOM (KITS) ×1 IMPLANT
PAD ARMBOARD 7.5X6 YLW CONV (MISCELLANEOUS) ×4 IMPLANT
PAD ELECT DEFIB RADIOL ZOLL (MISCELLANEOUS) ×2 IMPLANT
PENCIL BUTTON HOLSTER BLD 10FT (ELECTRODE) ×2 IMPLANT
PUMP BLOOD CENTRIMAG (PUMP) ×2 IMPLANT
PUNCH AORTIC ROT 4.0MM RCL 40 (MISCELLANEOUS) ×1 IMPLANT
PUNCH AORTIC ROTATE 4.0MM (MISCELLANEOUS) IMPLANT
PUNCH AORTIC ROTATE 4.5MM 8IN (MISCELLANEOUS) IMPLANT
PUNCH AORTIC ROTATE 5MM 8IN (MISCELLANEOUS) IMPLANT
SET CARDIOPLEGIA MPS 5001102 (MISCELLANEOUS) ×1 IMPLANT
SOLUTION ANTI FOG 6CC (MISCELLANEOUS) IMPLANT
SPONGE ABDOMINAL VAC ABTHERA (MISCELLANEOUS) ×1 IMPLANT
SPONGE GAUZE 4X4 12PLY STER LF (GAUZE/BANDAGES/DRESSINGS) ×1 IMPLANT
SPONGE LAP 18X18 X RAY DECT (DISPOSABLE) IMPLANT
SPONGE LAP 4X18 X RAY DECT (DISPOSABLE) IMPLANT
STAPLER VISISTAT 35W (STAPLE) ×1 IMPLANT
STOPCOCK 4 WAY LG BORE MALE ST (IV SETS) ×1 IMPLANT
SUT BONE WAX W31G (SUTURE) ×2 IMPLANT
SUT ETHIBOND NAB MH 2-0 36IN (SUTURE) ×6 IMPLANT
SUT ETHIBOND X763 2 0 SH 1 (SUTURE) ×4 IMPLANT
SUT MNCRL AB 3-0 PS2 18 (SUTURE) ×4 IMPLANT
SUT MNCRL AB 4-0 PS2 18 (SUTURE) IMPLANT
SUT PDS AB 1 CTX 36 (SUTURE) ×4 IMPLANT
SUT PROLENE 2 0 MH 48 (SUTURE) ×3 IMPLANT
SUT PROLENE 2 0 SH DA (SUTURE) IMPLANT
SUT PROLENE 3 0 SH DA (SUTURE) ×2 IMPLANT
SUT PROLENE 3 0 SH1 36 (SUTURE) IMPLANT
SUT PROLENE 4 0 RB 1 (SUTURE) ×6
SUT PROLENE 4 0 SH DA (SUTURE) IMPLANT
SUT PROLENE 4-0 RB1 .5 CRCL 36 (SUTURE) IMPLANT
SUT PROLENE 5 0 C 1 36 (SUTURE) IMPLANT
SUT PROLENE 6 0 C 1 30 (SUTURE) ×2 IMPLANT
SUT PROLENE 7.0 RB 3 (SUTURE) ×11 IMPLANT
SUT PROLENE 8 0 BV175 6 (SUTURE) IMPLANT
SUT PROLENE BLUE 7 0 (SUTURE) ×2 IMPLANT
SUT PROLENE POLY MONO (SUTURE) ×1 IMPLANT
SUT SILK  1 MH (SUTURE) ×8
SUT SILK 1 MH (SUTURE) ×1 IMPLANT
SUT SILK 1 TIES 10X30 (SUTURE) ×1 IMPLANT
SUT STEEL 6MS V (SUTURE) IMPLANT
SUT STEEL STERNAL CCS#1 18IN (SUTURE) IMPLANT
SUT STEEL SZ 6 DBL 3X14 BALL (SUTURE) IMPLANT
SUT VIC AB 1 CTX 36 (SUTURE)
SUT VIC AB 1 CTX36XBRD ANBCTR (SUTURE) IMPLANT
SUT VIC AB 2-0 CT1 27 (SUTURE) ×6
SUT VIC AB 2-0 CT1 TAPERPNT 27 (SUTURE) IMPLANT
SUT VIC AB 2-0 CTX 27 (SUTURE) IMPLANT
SUT VIC AB 3-0 SH 27 (SUTURE)
SUT VIC AB 3-0 SH 27X BRD (SUTURE) IMPLANT
SUT VIC AB 3-0 X1 27 (SUTURE) ×2 IMPLANT
SUT VICRYL 4-0 PS2 18IN ABS (SUTURE) IMPLANT
SUTURE E-PAK OPEN HEART (SUTURE) ×2 IMPLANT
SYSTEM SAHARA CHEST DRAIN ATS (WOUND CARE) ×1 IMPLANT
SYSTEM SAHARA CHEST DRAIN RE-I (WOUND CARE) ×2 IMPLANT
TAPE CLOTH SURG 4X10 WHT LF (GAUZE/BANDAGES/DRESSINGS) ×1 IMPLANT
TAPE PAPER 2X10 WHT MICROPORE (GAUZE/BANDAGES/DRESSINGS) ×1 IMPLANT
TOWEL OR 17X24 6PK STRL BLUE (TOWEL DISPOSABLE) ×4 IMPLANT
TOWEL OR 17X26 10 PK STRL BLUE (TOWEL DISPOSABLE) ×4 IMPLANT
TRAY CATH LUMEN 1 20CM STRL (SET/KITS/TRAYS/PACK) ×1 IMPLANT
TRAY FOLEY IC TEMP SENS 16FR (CATHETERS) ×1 IMPLANT
TUBE 1/2X3/32 6FT (MISCELLANEOUS) ×1 IMPLANT
TUBE SUCT INTRACARD DLP 20F (MISCELLANEOUS) ×1 IMPLANT
TUBING ART PRESS 48 MALE/FEM (TUBING) ×2 IMPLANT
TUBING INSUFFLATION (TUBING) ×1 IMPLANT
TUBING MEDICAL 3X8X3X32 (MISCELLANEOUS) ×1 IMPLANT
UNDERPAD 30X30 (UNDERPADS AND DIAPERS) ×1 IMPLANT
WATER STERILE IRR 1000ML POUR (IV SOLUTION) ×4 IMPLANT

## 2016-11-03 SURGICAL SUPPLY — 116 items
ADH SKN CLS APL DERMABOND .7 (GAUZE/BANDAGES/DRESSINGS) ×2
APPLIER CLIP 9.375 SM OPEN (CLIP) ×3
APR CLP SM 9.3 20 MLT OPN (CLIP) ×2
BAG DECANTER FOR FLEXI CONT (MISCELLANEOUS) ×6 IMPLANT
BANDAGE ACE 4X5 VEL STRL LF (GAUZE/BANDAGES/DRESSINGS) ×3 IMPLANT
BANDAGE ACE 6X5 VEL STRL LF (GAUZE/BANDAGES/DRESSINGS) ×3 IMPLANT
BASKET HEART (ORDER IN 25'S) (MISCELLANEOUS) ×1
BASKET HEART (ORDER IN 25S) (MISCELLANEOUS) ×2 IMPLANT
BLADE STERNUM SYSTEM 6 (BLADE) ×3 IMPLANT
BLADE SURG 10 STRL SS (BLADE) ×2 IMPLANT
BLADE SURG 11 STRL SS (BLADE) ×1 IMPLANT
BLADE SURG ROTATE 9660 (MISCELLANEOUS) IMPLANT
BNDG GAUZE ELAST 4 BULKY (GAUZE/BANDAGES/DRESSINGS) ×3 IMPLANT
CANISTER SUCTION 2500CC (MISCELLANEOUS) ×3 IMPLANT
CANNULA AORTIC ROOT 9FR (CANNULA) ×1 IMPLANT
CANNULA EZ GLIDE AORTIC 21FR (CANNULA) ×6 IMPLANT
CATH CPB KIT OWEN (MISCELLANEOUS) ×3 IMPLANT
CATH THORACIC 36FR (CATHETERS) ×3 IMPLANT
CLIP APPLIE 9.375 SM OPEN (CLIP) IMPLANT
CLIP RETRACTION 3.0MM CORONARY (MISCELLANEOUS) ×1 IMPLANT
CLIP TI MEDIUM 24 (CLIP) ×1 IMPLANT
CLIP TI WIDE RED SMALL 24 (CLIP) ×1 IMPLANT
CONN ST 1/4X3/8  BEN (MISCELLANEOUS) ×4
CONN ST 1/4X3/8 BEN (MISCELLANEOUS) IMPLANT
COUNTER NEEDLE 20 DBL MAG RED (NEEDLE) ×1 IMPLANT
COVER PROBE W GEL 5X96 (DRAPES) ×1 IMPLANT
CRADLE DONUT ADULT HEAD (MISCELLANEOUS) ×3 IMPLANT
DERMABOND ADVANCED (GAUZE/BANDAGES/DRESSINGS) ×1
DERMABOND ADVANCED .7 DNX12 (GAUZE/BANDAGES/DRESSINGS) IMPLANT
DRAIN CHANNEL 32F RND 10.7 FF (WOUND CARE) ×7 IMPLANT
DRAPE CARDIOVASCULAR INCISE (DRAPES) ×3
DRAPE INCISE IOBAN 66X45 STRL (DRAPES) ×3 IMPLANT
DRAPE SLUSH/WARMER DISC (DRAPES) ×3 IMPLANT
DRAPE SRG 135X102X78XABS (DRAPES) ×2 IMPLANT
DRSG AQUACEL AG ADV 3.5X14 (GAUZE/BANDAGES/DRESSINGS) ×1 IMPLANT
DRSG COVADERM 4X14 (GAUZE/BANDAGES/DRESSINGS) ×3 IMPLANT
ELECT BLADE 4.0 EZ CLEAN MEGAD (MISCELLANEOUS) ×3
ELECT REM PT RETURN 9FT ADLT (ELECTROSURGICAL) ×6
ELECTRODE BLDE 4.0 EZ CLN MEGD (MISCELLANEOUS) ×2 IMPLANT
ELECTRODE REM PT RTRN 9FT ADLT (ELECTROSURGICAL) ×4 IMPLANT
FELT TEFLON 1X6 (MISCELLANEOUS) ×6 IMPLANT
GAUZE SPONGE 4X4 12PLY STRL (GAUZE/BANDAGES/DRESSINGS) ×6 IMPLANT
GLOVE BIO SURGEON STRL SZ 6 (GLOVE) ×2 IMPLANT
GLOVE BIO SURGEON STRL SZ 6.5 (GLOVE) ×6 IMPLANT
GLOVE ORTHO TXT STRL SZ7.5 (GLOVE) ×6 IMPLANT
GOWN STRL REUS W/ TWL LRG LVL3 (GOWN DISPOSABLE) ×8 IMPLANT
GOWN STRL REUS W/TWL LRG LVL3 (GOWN DISPOSABLE) ×15
HEMOSTAT POWDER SURGIFOAM 1G (HEMOSTASIS) ×9 IMPLANT
INSERT FOGARTY XLG (MISCELLANEOUS) ×3 IMPLANT
KIT BASIN OR (CUSTOM PROCEDURE TRAY) ×3 IMPLANT
KIT ROOM TURNOVER OR (KITS) ×3 IMPLANT
KIT SUCTION CATH 14FR (SUCTIONS) ×10 IMPLANT
KIT VASOVIEW HEMOPRO VH 3000 (KITS) ×3 IMPLANT
LEAD PACING MYOCARDI (MISCELLANEOUS) ×3 IMPLANT
MARKER GRAFT CORONARY BYPASS (MISCELLANEOUS) ×9 IMPLANT
NS IRRIG 1000ML POUR BTL (IV SOLUTION) ×15 IMPLANT
PACK OPEN HEART (CUSTOM PROCEDURE TRAY) ×3 IMPLANT
PAD ARMBOARD 7.5X6 YLW CONV (MISCELLANEOUS) ×6 IMPLANT
PAD ELECT DEFIB RADIOL ZOLL (MISCELLANEOUS) ×3 IMPLANT
PENCIL BUTTON HOLSTER BLD 10FT (ELECTRODE) ×4 IMPLANT
PUNCH AORTIC ROTATE 4.0MM (MISCELLANEOUS) IMPLANT
PUNCH AORTIC ROTATE 4.5MM 8IN (MISCELLANEOUS) IMPLANT
PUNCH AORTIC ROTATE 5MM 8IN (MISCELLANEOUS) IMPLANT
SET CARDIOPLEGIA MPS 5001102 (MISCELLANEOUS) ×1 IMPLANT
SOLUTION ANTI FOG 6CC (MISCELLANEOUS) IMPLANT
SPONGE GAUZE 4X4 12PLY STER LF (GAUZE/BANDAGES/DRESSINGS) ×2 IMPLANT
SPONGE LAP 18X18 X RAY DECT (DISPOSABLE) ×4 IMPLANT
SPONGE LAP 4X18 X RAY DECT (DISPOSABLE) IMPLANT
STOPCOCK 4 WAY LG BORE MALE ST (IV SETS) ×1 IMPLANT
SUT BONE WAX W31G (SUTURE) ×3 IMPLANT
SUT ETHIBOND X763 2 0 SH 1 (SUTURE) ×6 IMPLANT
SUT ETHILON 3 0 FSL (SUTURE) ×1 IMPLANT
SUT MNCRL AB 3-0 PS2 18 (SUTURE) ×6 IMPLANT
SUT MNCRL AB 4-0 PS2 18 (SUTURE) ×3 IMPLANT
SUT PDS AB 1 CTX 36 (SUTURE) ×6 IMPLANT
SUT PROLENE 2 0 SH DA (SUTURE) IMPLANT
SUT PROLENE 3 0 SH DA (SUTURE) ×4 IMPLANT
SUT PROLENE 3 0 SH1 36 (SUTURE) ×2 IMPLANT
SUT PROLENE 4 0 RB 1 (SUTURE) ×9
SUT PROLENE 4 0 SH DA (SUTURE) IMPLANT
SUT PROLENE 4-0 RB1 .5 CRCL 36 (SUTURE) IMPLANT
SUT PROLENE 5 0 C 1 36 (SUTURE) ×2 IMPLANT
SUT PROLENE 6 0 C 1 30 (SUTURE) ×5 IMPLANT
SUT PROLENE 7.0 RB 3 (SUTURE) ×10 IMPLANT
SUT PROLENE 8 0 BV175 6 (SUTURE) ×8 IMPLANT
SUT PROLENE BLUE 7 0 (SUTURE) ×3 IMPLANT
SUT PROLENE POLY MONO (SUTURE) ×2 IMPLANT
SUT SILK  1 MH (SUTURE) ×2
SUT SILK 1 MH (SUTURE) ×2 IMPLANT
SUT STEEL 6MS V (SUTURE) ×1 IMPLANT
SUT STEEL STERNAL CCS#1 18IN (SUTURE) IMPLANT
SUT STEEL SZ 6 DBL 3X14 BALL (SUTURE) ×2 IMPLANT
SUT TEM PAC WIRE 2 0 SH (SUTURE) ×5 IMPLANT
SUT VIC AB 1 CTX 36 (SUTURE)
SUT VIC AB 1 CTX36XBRD ANBCTR (SUTURE) IMPLANT
SUT VIC AB 2-0 CT1 27 (SUTURE) ×9
SUT VIC AB 2-0 CT1 TAPERPNT 27 (SUTURE) IMPLANT
SUT VIC AB 2-0 CTX 27 (SUTURE) IMPLANT
SUT VIC AB 3-0 SH 27 (SUTURE)
SUT VIC AB 3-0 SH 27X BRD (SUTURE) IMPLANT
SUT VIC AB 3-0 X1 27 (SUTURE) IMPLANT
SUT VICRYL 4-0 PS2 18IN ABS (SUTURE) IMPLANT
SUTURE E-PAK OPEN HEART (SUTURE) ×2 IMPLANT
SYSTEM SAHARA CHEST DRAIN ATS (WOUND CARE) ×3 IMPLANT
SYSTEM SAHARA CHEST DRAIN RE-I (WOUND CARE) ×1 IMPLANT
TAPE CLOTH SURG 4X10 WHT LF (GAUZE/BANDAGES/DRESSINGS) ×1 IMPLANT
TAPE PAPER 2X10 WHT MICROPORE (GAUZE/BANDAGES/DRESSINGS) ×1 IMPLANT
TOWEL OR 17X24 6PK STRL BLUE (TOWEL DISPOSABLE) ×6 IMPLANT
TOWEL OR 17X26 10 PK STRL BLUE (TOWEL DISPOSABLE) ×6 IMPLANT
TRAY CATH LUMEN 1 20CM STRL (SET/KITS/TRAYS/PACK) ×1 IMPLANT
TRAY FOLEY IC TEMP SENS 16FR (CATHETERS) ×3 IMPLANT
TUBING ART PRESS 48 MALE/FEM (TUBING) ×2 IMPLANT
TUBING INSUFFLATION (TUBING) ×3 IMPLANT
UNDERPAD 30X30 (UNDERPADS AND DIAPERS) ×4 IMPLANT
WATER STERILE IRR 1000ML POUR (IV SOLUTION) ×6 IMPLANT
YANKAUER SUCT BULB TIP NO VENT (SUCTIONS) ×1 IMPLANT

## 2016-11-03 NOTE — OR Nursing (Signed)
12:10 - 45 minute call to SICU charge nurse

## 2016-11-03 NOTE — Progress Notes (Signed)
   11/03/16 1515  Clinical Encounter Type  Visited With Patient and family together  Visit Type Code  Referral From Nurse  Consult/Referral To Chaplain  Spiritual Encounters  Spiritual Needs Prayer;Emotional  Stress Factors  Patient Stress Factors Health changes  Family Stress Factors Health changes  Pt. Unresponsive, just gotten out of surgery @ 2S Rm#16, code blue, was immediately taken to the OR.  Family present.  Ministry of presence, prayer, emotional support.  [facility] IncChaplain Zailynn Brandel [phone]

## 2016-11-03 NOTE — Progress Notes (Signed)
Pt transported to pre-op on monitor and nitro drip, no distress noted, pt axox4, VSS, on RA. Report given to [facility] IncRebecca with anesthesia

## 2016-11-03 NOTE — Anesthesia Procedure Notes (Signed)
Procedure Name: Intubation Date/Time: 11/03/2016 7:50 AM Performed by: Virgel GessHOLTZMAN, Adamary Savary LEFFEW Pre-anesthesia Checklist: Patient identified, Patient being monitored, Timeout performed, Emergency Drugs available and Suction available Patient Re-evaluated:Patient Re-evaluated prior to inductionOxygen Delivery Method: Circle System Utilized Preoxygenation: Pre-oxygenation with 100% oxygen Intubation Type: IV induction Laryngoscope Size: Mac and 3 Grade View: Grade I Tube type: Oral Tube size: 7.5 mm Number of attempts: 1 Airway Equipment and Method: Stylet Placement Confirmation: ETT inserted through vocal cords under direct vision,  positive ETCO2 and breath sounds checked- equal and bilateral Secured at: 21 cm Tube secured with: Tape Dental Injury: Teeth and Oropharynx as per pre-operative assessment  Comments: RSI with cricoid pressure

## 2016-11-03 NOTE — Brief Op Note (Signed)
10/30/2016 - 11/03/2016  9:28 PM  PATIENT:  Christina Morrison  40 y.o. female  PRE-OPERATIVE DIAGNOSIS:  bring back heart, cardiac arrest  POST-OPERATIVE DIAGNOSIS:  bring back heart, cardiac arrest  PROCEDURE:  Procedure(s): EMERGENCY REDO STERNOTOMY IN ICU, REDO CABG X 1, PLACEMENT OF BILATERAL VAD, PLACEMENT OF LEFT FEMORAL ARTERIAL LINE (N/A)  SURGEON:  Surgeon(s) and Role:    * Purcell Nailslarence H Ilo Beamon, MD - Primary    * Alleen BorneBryan K Bartle, MD    * Delight OvensEdward B Gerhardt, MD  ANESTHESIA:   Heather RobertsJames Singer, MD  CROSSCLAMP TIME:   40'  CARDIOPULMONARY BYPASS TIME: 184'   PATIENT DISPOSITION:   TO SICU IN CRITICAL BUT STABLE CONDITION  Purcell Nailslarence H Osker Ayoub, MD 11/03/2016 9:28 PM

## 2016-11-03 NOTE — Progress Notes (Signed)
RT transported patient from OR back to 2S without any complications

## 2016-11-03 NOTE — OR Nursing (Signed)
12:35 - 20 minute call to SICU charge nurse

## 2016-11-03 NOTE — Progress Notes (Signed)
  Echocardiogram Echocardiogram Transesophageal has been performed.  Janalyn HarderWest, Graviel Payeur R 11/03/2016, 10:12 AM

## 2016-11-03 NOTE — OR Nursing (Signed)
Pt was brought emergently to OR 14 after sustaining cardiac arrest and having chest reopened in SICU.  Timeout was not performed due to emergent situation.

## 2016-11-03 NOTE — Anesthesia Procedure Notes (Signed)
Central Venous Catheter Insertion Performed by: anesthesiologist Patient location: Pre-op. Preanesthetic checklist: patient identified, IV checked, site marked, risks and benefits discussed, surgical consent, monitors and equipment checked, pre-op evaluation, timeout performed and anesthesia consent Position: Trendelenburg Lidocaine 1% used for infiltration Landmarks identified Catheter size: 8.5 Fr PA cath was placed.Sheath introducer Swan type and PA catheter depth:thermodilation and 40PA Cath depth:40 Procedure performed using ultrasound guided technique. Attempts: 1 Patient tolerated the procedure well with no immediate complications.

## 2016-11-03 NOTE — Anesthesia Preprocedure Evaluation (Addendum)
Anesthesia Evaluation  Patient identified by MRN, date of birth, ID band  Reviewed: Allergy & Precautions, NPO status , Patient's Chart, lab work & pertinent test resultsPreop documentation limited or incomplete due to emergent nature of procedure.  Airway Mallampati: Intubated  TM Distance: >3 FB Neck ROM: Full    Dental  (+) Teeth Intact, Dental Advisory Given   Pulmonary Current Smoker,    breath sounds clear to auscultation       Cardiovascular hypertension, + angina + CAD and + Past MI   Rhythm:Regular Rate:Normal     Neuro/Psych  Headaches, PSYCHIATRIC DISORDERS Anxiety Depression Bipolar Disorder TIA   GI/Hepatic negative GI ROS, Neg liver ROS,   Endo/Other  negative endocrine ROS  Renal/GU negative Renal ROS  negative genitourinary   Musculoskeletal  (+) Arthritis , Osteoarthritis,    Abdominal   Peds negative pediatric ROS (+)  Hematology negative hematology ROS (+)   Anesthesia Other Findings   Reproductive/Obstetrics negative OB ROS                            Anesthesia Physical Anesthesia Plan  ASA: IV and emergent  Anesthesia Plan: General   Post-op Pain Management:    Induction: Inhalational  Airway Management Planned: Oral ETT  Additional Equipment: Arterial line, CVP, PA Cath and TEE  Intra-op Plan:   Post-operative Plan: Post-operative intubation/ventilation  Informed Consent: I have reviewed the patients History and Physical, chart, labs and discussed the procedure including the risks, benefits and alternatives for the proposed anesthesia with the patient or authorized representative who has indicated his/her understanding and acceptance.   Only emergency history available and History available from chart only  Plan Discussed with: CRNA  Anesthesia Plan Comments: (Patient suffered cardiac arrest in SICU following CABG. Chest opened in SICU per Dr. Aida Raideresusitative  efforts ongoing. Patient brought to OR for emergency exploration and reoperation.  Kipp Broodavid Shavana Calder)       Anesthesia Quick Evaluation

## 2016-11-03 NOTE — Progress Notes (Signed)
TCTS BRIEF SICU PROGRESS NOTE  Day of Surgery  S/P Procedure(s) (LRB): EMERGENCY REDO STERNOTOMY IN ICU, REDO CABG X 1, PLACEMENT OF BILATERAL VAD, PLACEMENT OF LEFT FEMORAL ARTERIAL LINE (N/A)   Sedated on vent NSR w/ MAP 90-100 stable BiVAD flows off all pressors except milrinone, CVP and PA pressures relatively low O2 sats 100%, initial ABG w/ good gas exchange Minimal chest tube output and Esmark dressing flat Excellent UOP Labs okay w/ initial Hgb 11.7 CXR looks good  Plan: Continue current support  Purcell Nailslarence H Shalayah Beagley, MD 11/03/2016 10:59 PM

## 2016-11-03 NOTE — Anesthesia Preprocedure Evaluation (Signed)
Anesthesia Evaluation  Patient identified by MRN, date of birth, ID band Patient awake    Reviewed: Allergy & Precautions, NPO status , Patient's Chart, lab work & pertinent test results  Airway Mallampati: II  TM Distance: >3 FB Neck ROM: Full    Dental  (+) Teeth Intact   Pulmonary Current Smoker,    breath sounds clear to auscultation       Cardiovascular  Rhythm:Regular Rate:Normal     Neuro/Psych    GI/Hepatic   Endo/Other    Renal/GU      Musculoskeletal   Abdominal   Peds  Hematology   Anesthesia Other Findings   Reproductive/Obstetrics                             Anesthesia Physical Anesthesia Plan  ASA: III  Anesthesia Plan: General   Post-op Pain Management:    Induction: Intravenous  Airway Management Planned: Oral ETT  Additional Equipment: Arterial line, CVP, PA Cath, 3D TEE and Ultrasound Guidance Line Placement  Intra-op Plan:   Post-operative Plan: Post-operative intubation/ventilation  Informed Consent: I have reviewed the patients History and Physical, chart, labs and discussed the procedure including the risks, benefits and alternatives for the proposed anesthesia with the patient or authorized representative who has indicated his/her understanding and acceptance.     Plan Discussed with: CRNA and Anesthesiologist  Anesthesia Plan Comments:         Anesthesia Quick Evaluation

## 2016-11-03 NOTE — Progress Notes (Signed)
Pharmacy Antibiotic Note  Christina Morrison is a 40 y.o. female s/p CABG x 3 today. Coded as soon as she arrived to 2S. Sternotomy done at bedside and she was taken back to the OR for emergent redo CABG x 1. She now has a left and right centrimag. Pharmacy consulted to dose vancomycin for prophylaxis. Renal function wnl. Received a dose of vancomycin 1250mg  this morning at 0740.  Vancomycin trough goal 10-15  Plan: 1) Vancomycin 1g  IV q12 2) Follow renal function, LOT, level if needed  Height: 5\' 2"  (157.5 cm) Weight: 158 lb 4.6 oz (71.8 kg) IBW/kg (Calculated) : 50.1  Temp (24hrs), Avg:97.1 F (36.2 C), Min:95.9 F (35.5 C), Max:98 F (36.7 C)   Recent Labs Lab 11/01/16 0636 11/02/16 0037 11/03/16 0406  11/03/16 1331 11/03/16 1619 11/03/16 1632 11/03/16 1706 11/03/16 1739 11/03/16 1953 11/03/16 2209  WBC 4.1 5.2 5.3  --  15.7*  --   --   --   --   --  20.5*  CREATININE 0.67 0.73 0.79  < >  --  0.50 0.50 0.70 0.70 0.80  --   < > = values in this interval not displayed.  Estimated Creatinine Clearance: 86.8 mL/min (by C-G formula based on SCr of 0.8 mg/dL).    Allergies  Allergen Reactions  . Ranexa [Ranolazine] Nausea Only    Dizziness (only the 1,000 mg dose has this effect)    Antimicrobials this admission: 11/20 Vancomycin >> 11/20 Cefuroxime >>  Dose adjustments this admission: n/a  Microbiology results: none  Thank you for allowing pharmacy to be a part of this patient's care.  Fredrik RiggerMarkle, Haliey Romberg Sue 11/03/2016 10:22 PM

## 2016-11-03 NOTE — Progress Notes (Signed)
Attempted to see patient once around 8:00AM and again around 4:30PM. Patient not available for me to evaluate as she was initially in surgery for CABG in the morning and return to the OR apparently secondary to CODE this afternoon. Patient has already been transferred to TCTS service. Will sign off, but welcome consult if medicine is needed.  Jacquelin Hawkingalph Lamanda Rudder, MD [facility] 11/03/2016, 5:26 PM Pager: [phone]

## 2016-11-03 NOTE — Anesthesia Postprocedure Evaluation (Signed)
Anesthesia Post Note  Patient: Christina Morrison  Procedure(s) Performed: Procedure(s) (LRB): EMERGENCY REDO STERNOTOMY IN ICU, REDO CABG X 1, PLACEMENT OF BILATERAL VAD, PLACEMENT OF LEFT FEMORAL ARTERIAL LINE (N/A)  Patient location during evaluation: SICU Anesthesia Type: General Level of consciousness: sedated Pain management: pain level controlled Vital Signs Assessment: post-procedure vital signs reviewed and stable Respiratory status: patient remains intubated per anesthesia plan Cardiovascular status: stable Anesthetic complications: no    Last Vitals:  Vitals:   11/03/16 1545 11/03/16 2125  BP:    Pulse:  80  Resp: 18 18  Temp:      Last Pain:  Vitals:   11/03/16 0600  TempSrc:   PainSc: 5                  Bracha Frankowski DANIEL

## 2016-11-03 NOTE — OR Nursing (Signed)
Patient placed on left and right CentriMag.  8 keepers left in chest at this time.  Wound vac applied to chest.

## 2016-11-03 NOTE — Brief Op Note (Addendum)
10/30/2016 - 11/03/2016  10:18 AM  PATIENT:  Christina Morrison  40 y.o. female  PRE-OPERATIVE DIAGNOSIS:  1. Unstable angina 2. Coronary artery disease  POST-OPERATIVE DIAGNOSIS:  1. Unstable angina 2. Coronary artery disease   PROCEDURE:  TRANSESOPHAGEAL ECHOCARDIOGRAM (TEE), MEDIAN STERNOTOMY for CORONARY ARTERY BYPASS GRAFTING (CABG) x  Three (LIMA to LAD, , SVG to LAD, SVG to DISTAL CIRCUMFLEX) using left internal mammary artery and right thigh greater saphenous vein harvested endoscopically as well as open harvest right lower leg greater saphenous vein  SURGEON:    Purcell Nailslarence H Owen, MD  ASSISTANTS:  Ardelle Ballsonielle M. Zimmerman, PA-C  ANESTHESIA:   Kipp Broodavid Joslin, MD  CROSSCLAMP TIME:   1674'  CARDIOPULMONARY BYPASS TIME: 124'  FINDINGS:  Ongoing chest pain on arrival to OR and during induction  Severe anterior wall hypokinesis/akinesis on initial TEE images which improved/resolved with administration I.V. Nitroglycerine  Small caliber LIMA conduit for grafting  Good quality SVG conduit for grafting  Good quality target vessels for grafting  OM1 branch too small for grafting  Transient severe anterior wall hypokinesis/akinesis after 2 initial attempts to separate from CPB  No technical problems with LIMA conduit or LIMA-LAD anastomosis but sluggish flow through graft suggestive of spasm  Normal LV function after final separation from CPB after placement of SVG to distal LAD  COMPLICATIONS: None  BASELINE WEIGHT: 71.8 kg  PATIENT DISPOSITION:   TO SICU IN STABLE CONDITION  Purcell Nailslarence H Owen, MD 11/03/2016 12:41 PM

## 2016-11-03 NOTE — Anesthesia Procedure Notes (Signed)
Central Venous Catheter Insertion Performed by: anesthesiologist 11/03/2016 8:04 AM Patient location: OR. Preanesthetic checklist: patient identified, IV checked, site marked, risks and benefits discussed, surgical consent, monitors and equipment checked, pre-op evaluation and timeout performed Position: Trendelenburg Landmarks identified and Seldinger technique used Catheter size: 8 Fr Central line was placed.Single lumen Procedure performed without using ultrasound guided technique. Attempts: 1 Following insertion, line sutured and dressing applied. Post procedure assessment: blood return through all ports, free fluid flow and no air. Patient tolerated the procedure well with no immediate complications.

## 2016-11-03 NOTE — Transfer of Care (Signed)
Immediate Anesthesia Transfer of Care Note  Patient: Christina Morrison  Procedure(s) Performed: Procedure(s): CORONARY ARTERY BYPASS GRAFTING (CABG) x  three, using left internal mammary artery and right leg greater saphenous vein harvested endoscopically (N/A) TRANSESOPHAGEAL ECHOCARDIOGRAM (TEE) (N/A)  Patient Location: SICU  Anesthesia Type:General  Level of Consciousness: Patient remains intubated per anesthesia plan  Airway & Oxygen Therapy: Patient remains intubated per anesthesia plan and Patient placed on Ventilator (see vital sign flow sheet for setting)  Post-op Assessment: Report given to RN and Post -op Vital signs reviewed and stable  Post vital signs: Reviewed and stable  Last Vitals:  Vitals:   11/03/16 0720 11/03/16 0721  BP:    Pulse: 93 83  Resp: 16 20  Temp:      Last Pain:  Vitals:   11/03/16 0600  TempSrc:   PainSc: 5       Patients Stated Pain Goal: 0 (11/02/16 2000)  Complications: No apparent anesthesia complications

## 2016-11-03 NOTE — Progress Notes (Signed)
   11/03/16 2100  Clinical Encounter Type  Visited With Patient  Visit Type Follow-up;Spiritual support;Social support;Post-op;Critical Care;Patient actively dying  Referral From Nurse  Spiritual Encounters  Spiritual Needs Emotional;Grief support  [facility] called to be with family following hearing that pt will not recover; [facility] was not needed for one group of family but was asked by daughters to bring them to waiting area offering them grief support; [facility] [phone].Erline LevineMichael I Iracema Lanagan 9:37 PM

## 2016-11-03 NOTE — Op Note (Signed)
CARDIOTHORACIC SURGERY OPERATIVE NOTE  Date of Procedure: 11/03/2016  Preoperative Diagnosis:   Severe 2-vessel Coronary Artery Disease  Unstable Angina Pectoris  Postoperative Diagnosis: Same  Procedure:    Coronary Artery Bypass Grafting x 3   Left Internal Mammary Artery to Distal Left Anterior Descending Coronary Artery  Saphenous Vein Graft to Distal Left Circumflex Coronary Artery  Sapheonous Vein Graft to Distal Left Anterior Descending Coronary Artery  Endoscopic Vein Harvest from Right Thigh and Lower Leg  Surgeon: Salvatore Decent. Cornelius Moras, MD  Assistant: Ardelle Balls, PA-C  Anesthesia: Kipp Brood, MD  Operative Findings:  Ongoing chest pain on arrival to OR and during induction  Severe anterior wall hypokinesis/akinesis on initial TEE images which improved/resolved with administration I.V. Nitroglycerine  Small caliber LIMA conduit for grafting  Good quality SVG conduit for grafting  Good quality target vessels for grafting  OM1 branch too small for grafting  Transient severe anterior wall hypokinesis/akinesis after 2 initial attempts to separate from CPB  No technical problems with LIMA conduit or LIMA-LAD anastomosis but sluggish flow through graft suggestive of spasm  Normal LV function after final separation from CPB after placement of SVG to distal LAD    BRIEF CLINICAL NOTE AND INDICATIONS FOR SURGERY  Patient is a 40 year old female with history of coronary artery disease status post PCI and stenting on 2 previous occasions, hyperlipidemia, long-standing tobacco abuse, a strong family history of coronary artery disease, bipolar disorder, history of substance abuse and occluding cocaine use in the remote past, and chronic pain on long-term oral narcotics who was admitted to the hospital with unstable angina pectoris and has been referred for surgical consultation to discuss treatment options for management of severe multivessel coronary artery  disease. The patient's cardiac history dates back to April 2016 when she presented with an acute non-ST segment elevation myocardial infarction that occurred in the context of recent cocaine use. She was treated with bare-metal stenting to a diagonal branch of the left anterior descending coronary artery. She subsequently developed chronic chest pain and has undergone multiple cardiac catheterization studies despite the fact that she quit using illicit drugs other than long-term oral narcotics and tobacco. In January 2017 she presented with a second non-ST segment elevation myocardial infarction and was treated with overlapping drug-eluting stents in the left circumflex coronary artery for progressive obstructive disease.  The patient states that she did fairly well after her last stenting procedure in January of this year and she was last seen in follow-up by Dr. Excell Seltzer on 05/20/2016. Approximately 5 days ago the patient developed sudden onset recurrent substernal chest pain radiating to the neck that was similar to previous episodes but according to the patient's somewhat more severe. Initially symptoms were alleviated by nitroglycerin, but the patient's symptoms continued to recur even when she was at rest or trying to sleep at night.  She was readmitted to the hospital 10/30/2016 for accelerating recurrent symptoms of substernal chest pain. She was pain free on arrival and EKG was without significant ST segment elevation. Serial troponin levels have been flat ranging from 0.03 to 0.09 at a maximum. She has continued to have intermittent episodes of chest pain for which she is currently on intravenous nitroglycerin, heparin, and tirofiban. She underwent diagnostic cardiac catheterization yesterday evening by Dr. Okey Dupre and was found to have significant progression of disease with 70% ostial stenosis of the left anterior descending coronary artery that was confirmed using IVUS and 70% ostial in-stent restenosis of  the left circumflex  coronary artery. There remained patent stent in the diagonal branch with very mild in-stent restenosis. There remained unchanged ostial stenosis of a small to medium-sized first OM branch. There is no significant disease in the right coronary artery territory. Cardiothoracic surgical consultation was requested to discuss treatment options. Plavix was discontinued after catheterization and tirofiban was started.   DETAILS OF THE OPERATIVE PROCEDURE  Preparation:  The patient is brought to the operating room on the above mentioned date and central monitoring was established by the anesthesia team including placement of Swan-Ganz catheter and radial arterial line. The patient is placed in the supine position on the operating table.  Intravenous antibiotics are administered. General endotracheal anesthesia is induced uneventfully. A Foley catheter is placed.  Baseline transesophageal echocardiogram was performed.  Findings were notable for severe hypokinesis or akinesis of the distal anterior wall and apex consistent with acute myocardial ischemia in the left anterior descending coronary artery distribution. Intravenous nitroglycerin was administered in the wall motion abnormality resolved. No other significant abnormalities were noted.  The patient's chest, abdomen, both groins, and both lower extremities are prepared and draped in a sterile manner. A time out procedure is performed.   Surgical Approach and Conduit Harvest:  A median sternotomy incision was performed and the left internal mammary artery is dissected from the chest wall and prepared for bypass grafting. The left internal mammary artery is notably small caliber but normal in appearance.  It was prone to spasm but had adequate flow. Simultaneously, the greater saphenous vein is obtained from the patient's right thigh using endoscopic vein harvest technique. The saphenous vein is notably good quality conduit. After  removal of the saphenous vein, the small surgical incisions in the lower extremity are closed with absorbable suture. Following systemic heparinization, the left internal mammary artery was transected distally noted to have excellent flow.   Extracorporeal Cardiopulmonary Bypass and Myocardial Protection:  The pericardium is opened. The ascending aorta is normal in appearance. The ascending aorta and the right atrium are cannulated for cardiopulmonary bypass.  Adequate heparinization is verified.     The entire pre-bypass portion of the operation was notable for stable hemodynamics.  Cardiopulmonary bypass was begun and the surface of the heart is inspected. Distal target vessels are selected for coronary artery bypass grafting. A cardioplegia cannula is placed in the ascending aorta.  A temperature probe was placed in the interventricular septum.  The patient is allowed to cool passively to [facility]32C systemic temperature.  The aortic cross clamp is applied and cold blood cardioplegia is delivered initially in an antegrade fashion through the aortic root.  Iced saline slush is applied for topical hypothermia.  The initial cardioplegic arrest is rapid with early diastolic arrest.  Repeat doses of cardioplegia are administered intermittently throughout the entire cross clamp portion of the operation through the aortic root and through subsequently placed vein graft in order to maintain completely flat electrocardiogram and septal myocardial temperature below 15C.  Myocardial protection was felt to be excellent.  Coronary Artery Bypass Grafting:   The distal left circumflex coronary artery was grafted using a reversed saphenous vein graft in an end-to-side fashion.  At the site of distal anastomosis the target vessel was good quality and measured approximately 1.5 mm in diameter.  The distal left anterior coronary artery was grafted with the left internal mammary artery in an end-to-side fashion.  At the  site of distal anastomosis the target vessel was good quality and measured approximately 1.8 mm in diameter.  The proximal vein graft anastomosis was placed directly to the ascending aorta prior to removal of the aortic cross clamp.  The septal myocardial temperature rose rapidly after reperfusion of the left internal mammary artery graft.  The aortic cross clamp was removed after a total cross clamp time of 41 minutes.  All proximal and distal coronary anastomoses were inspected for hemostasis and appropriate graft orientation. Epicardial pacing wires are fixed to the right ventricular outflow tract and to the right atrial appendage. The patient is rewarmed to 37C temperature. The patient is weaned and disconnected from cardiopulmonary bypass.  The patient's rhythm at separation from bypass was sinus.  The patient was weaned from cardiopulmonary bypass without any inotropic support. Total cardiopulmonary bypass time for the operation was 51 minutes.  Shortly after separation from bypass the patient developed hypotension with elevated pulmonary artery pressures. Transesophageal echocardiogram revealed hypokinesis in the distal anterior wall. A sterile Doppler flow probe was placed on the left internal mammary artery pedicle and noted to have good flow. Cardiopulmonary bypass was resumed. Low-dose dopamine infusion was begun. The patient was rested for several minutes and a second attempt at separation from cardioplegic bypass was performed.  Initially the patient looked quite good with normal wall motion on transesophageal echocardiogram. However, within a few minutes the patient again developed hypotension with elevated pulmonary artery pressures and hypokinesis in the distal anterior wall distribution. Cardiopulmonary bypass was resumed.  A decision was made to redo the distal anastomosis of the left internal mammary artery graft to the left anterior descending coronary artery. An antegrade cardioplegia  cannula is placed in the ascending aorta.  The aortic cross clamp is replaced and a bulldog clamp placed on the left internal mammary artery pedicle. Cold blood cardioplegia was administered to the aortic root. Iced saline slush is applied for topical hypothermia.  The distal anastomosis of the left internal mammary artery graft to the left anterior descending coronary artery is taken down. On careful inspection the anastomosis appears perfectly normal and a coronary probe will easily pass in all directions. The bulldog clamp is released and there is sluggish flow in the left internal mammary artery pedicle.  The left internal mammary artery graft is sewn to the left anterior descending coronary artery a second time. Simultaneously, a segment of greater saphenous vein was obtained the patient's right lower leg through a series of open longitudinal incisions.    The distal left anterior coronary artery was grafted with a reversed greater saphenous vein in an end-to-side fashion.  At the site of distal anastomosis the target vessel was good quality and measured approximately 1.5 mm in diameter.  The distal anastomosis was placed approximately 1 cm beyond the left internal mammary artery distal anastomosis.  The left internal mammary artery graft is opened. Septal myocardial temperature rises. The aortic cross clamp is removed after a second cross clamp duration of 33 minutes such that the grand total cross-clamp duration for the entire procedure was 74 minutes.  The proximal end of the saphenous vein graft to the distal left anterior descending coronary artery is sewn to the hood of the proximal vein graft to the left circumflex coronary artery.  All proximal and distal coronary anastomoses are inspected for hemostasis and appropriate graft orientation.    Procedure Completion:  The patient is weaned and separated from cardiopulmonary bypass without difficulty. The patient's rhythm at separation from  bypass in sinus rhythm. The patient is weaned from bypass on low-dose dopamine and epinephrine infusions. The  duration of cardiopulmonary bypass for the third segment was 65 minutes such that the grand total duration of cardioplegic bypass for the entire operation was 124 minutes.  Follow-up transesophageal echocardiogram initially reveals mild hypokinesis of the distal anterior wall but this resolves completely, and by the termination of the procedure left ventricular systolic function appeared entirely normal. No other significant abnormalities were noted.  The aortic and venous cannula were removed uneventfully. Protamine was administered to reverse the anticoagulation. The mediastinum and pleural space were inspected for hemostasis and irrigated with saline solution. The mediastinum and both pleural spaces were drained using 4 chest tubes placed through separate stab incisions inferiorly.  The soft tissues anterior to the aorta were reapproximated loosely. The sternum is closed with double strength sternal wire. The soft tissues anterior to the sternum were closed in multiple layers and the skin is closed with a running subcuticular skin closure.  The post-bypass portion of the operation was notable for stable rhythm and hemodynamics.  No blood products were administered during the operation.   Disposition:  The patient tolerated the procedure well and is transported to the surgical intensive care in stable condition. There are no intraoperative complications. All sponge instrument and needle counts are verified correct at completion of the operation.    Salvatore Decentlarence H. Cornelius Moraswen MD 11/03/2016 12:47 PM

## 2016-11-03 NOTE — Anesthesia Procedure Notes (Signed)
Anesthesia Procedure Image    

## 2016-11-03 NOTE — OR Nursing (Signed)
2103 rolling call made to SICU.

## 2016-11-03 NOTE — Progress Notes (Signed)
Pt's BP trending downward on Art Line. Albumin started, Neo increased to 100 mcg, dopamine restarted at 2 mcg. Legs up to promote venous return. AAI paced at 90. No EKG changes.  PAP pressures 30's/upper 20's, previously upper 20's/mid 10's. BP did not respond to efforts. Pt previously responsive, now unresponsive. CODE BLUE and ACLS protocol initiated. See code sheet. Dr. Laneta SimmersBartle at bedside to assist. Dr. Cornelius Moraswen called and arrived at 1523. Resternotomy started at bedside. 2 RNs and Dr. Cornelius Moraswen sterile at bedside. Chest covered in betadine after dressing removed. Chest draped and Dr. Cornelius Moraswen initiated resternotomy. ACLS protocol continued. See Code sheet. OR team paged to come to bedside.  Dr. Cornelius Moraswen at bedside performing cardiac massage. Pt taken to OR emergently following resternotomy.  Family made aware of situation. Taken into consult room with Administrative Coordinator, Chaplain, and 2S RN.

## 2016-11-03 NOTE — Progress Notes (Deleted)
Cardiology Office Note:    Date:  11/03/2016   ID:  Christina Morrison, DOB Mar 05, 1976, MRN [id number]  PCP:  Pearson GrippeJames Kim, MD  Cardiologist:  ***  Electrophysiologist:  ***  Referring MD: Pearson GrippeKim, James, MD   No chief complaint on file. *** *** History of Present Illness:    Christina Morrison is a 40 y.o. female with a hx of extensive premature CAD. She initially presented in 4/16 with non-STEMI in the setting of cocaine use treated with BMS to the DX of the LAD. She has chronic chest pain and has undergone repeated cardiac catheterizations. She had a recurrent non-STEMI in 1/17 treated with overlapping DES to the LCx. Other history includes chronic pain, HL, depression, tobacco abuse, prior TIA. Last seen by Dr. Excell Seltzerooper 6/17. The patient's chest pain was overall stable. She did note worsening symptoms if she stopped taking her medications.    Prior CV studies that were reviewed today include:    LHC 10/31/16 Myoview 3/17 Echo 4/16  Past Medical History:  Diagnosis Date  . Anxiety   . Bipolar 1 disorder (HCC)   . Chronic pain   . Cocaine use   . Coronary artery disease    a. 03/2015 NSTEMI/PCI in setting of cocaine use - BMS to diagonal. b. NSTEMI 12/2015 s/o overlapping DES to Cx, residual mild RCA and LAD disease, 75% OM1.  . Depression   . Dyslipidemia   . Migraine   . Osteoarthritis   . Polysubstance abuse    a. tobacco/cocaine  . Scoliosis   . TIA (transient ischemic attack)     Past Surgical History:  Procedure Laterality Date  . CARDIAC CATHETERIZATION  03/21/2015   Procedure: CORONARY STENT INTERVENTION;  Surgeon: Tonny BollmanMichael Cooper, MD;  [facility];  Service: Cardiovascular;;  diag bms 2.75 x 12 vision  . CARDIAC CATHETERIZATION N/A 01/14/2016   Procedure: Coronary Stent Intervention;  Surgeon: Tonny BollmanMichael Cooper, MD;  [facility];  Service: Cardiovascular;  Laterality: N/A;  . CARDIAC CATHETERIZATION N/A 10/31/2016   Procedure: Left Heart Cath and Coronary  Angiography;  Surgeon: Yvonne Kendallhristopher End, MD;  [facility];  Service: Cardiovascular;  Laterality: N/A;  . CARDIAC CATHETERIZATION N/A 10/31/2016   Procedure: Intravascular Ultrasound/IVUS;  Surgeon: Yvonne Kendallhristopher End, MD;  Location: [facility] INVASIVE CV LAB;  Service: Cardiovascular;  Laterality: N/A;  . CORONARY STENT PLACEMENT  03/21/2015   first diagonal  . ESOPHAGOGASTRODUODENOSCOPY (EGD) WITH PROPOFOL N/A 06/11/2015   Procedure: ESOPHAGOGASTRODUODENOSCOPY (EGD) WITH PROPOFOL;  Surgeon: Bernette Redbirdobert Buccini, MD;  [facility];  Service: Endoscopy;  Laterality: N/A;  . LEFT HEART CATHETERIZATION WITH CORONARY ANGIOGRAM N/A 03/21/2015   Procedure: LEFT HEART CATHETERIZATION WITH CORONARY ANGIOGRAM;  Surgeon: Tonny BollmanMichael Cooper, MD;  [facility];  Service: Cardiovascular;  Laterality: N/A;  . LEFT HEART CATHETERIZATION WITH CORONARY ANGIOGRAM N/A 04/11/2015   Procedure: LEFT HEART CATHETERIZATION WITH CORONARY ANGIOGRAM;  Surgeon: Kathleene Hazelhristopher D McAlhany, MD;  [facility];  Service: Cardiovascular;  Laterality: N/A;    Current Medications: No outpatient prescriptions have been marked as taking for the 11/03/16 encounter (Appointment) with Beatrice LecherScott T Trenise Turay, PA-C.     Allergies:   Ranexa [ranolazine]   Social History   Social History  . Marital status: Divorced    Spouse name: N/A  . Number of children: N/A  . Years of education: N/A   Social History Main Topics  . Smoking status: Current Some Day Smoker    Packs/day: 0.10  Types: Cigarettes  . Smokeless tobacco: Never Used  . Alcohol use No  . Drug use: No     Comment: Prior cocaine use  . Sexual activity: Not on file   Other Topics Concern  . Not on file   Social History Narrative  . No narrative on file     Family History:  The patient's ***family history includes Cancer in her maternal aunt; Depression in her mother; Diabetes in her father; Heart disease in her father and mother; Hyperlipidemia in  her father and mother; Hypertension in her father and mother; Mental retardation in her father and mother; Stroke in her maternal grandmother.   ROS:   Please see the history of present illness.    ROS All other systems reviewed and are negative.   EKGs/Labs/Other Test Reviewed:    EKG:  EKG is *** ordered today.  The ekg ordered today demonstrates ***  Recent Labs: 11/02/2016: ALT 16 11/03/2016: BUN 5; Creatinine, Ser 0.79; Hemoglobin 10.6; Platelets 226; Potassium 3.4; Sodium 140   Recent Lipid Panel    Component Value Date/Time   CHOL 185 03/24/2016 1113   TRIG 141 03/24/2016 1113   HDL 35 (L) 03/24/2016 1113   CHOLHDL 5.3 (H) 03/24/2016 1113   VLDL 28 03/24/2016 1113   LDLCALC 122 03/24/2016 1113     Physical Exam:    VS:  LMP 10/27/2016 (Exact Date)     Wt Readings from Last 3 Encounters:  10/31/16 158 lb 4.6 oz (71.8 kg)  05/20/16 164 lb 9.6 oz (74.7 kg)  03/12/16 154 lb (69.9 kg)     ***Physical Exam  ASSESSMENT:    No diagnosis found. PLAN:    In order of problems listed above:  ***   Medication Adjustments/Labs and Tests Ordered: Current medicines are reviewed at length with the patient today.  Concerns regarding medicines are outlined above.  Medication changes, Labs and Tests ordered today are outlined in the Patient Instructions noted below. There are no Patient Instructions on file for this visit. Signed, Tereso NewcomerScott Shubham Thackston, PA-C  11/03/2016 9:00 AM    [facility] [address], [place], [place]  [address] Phone: [phone]; Fax: [phone]

## 2016-11-03 NOTE — Op Note (Signed)
CARDIOTHORACIC SURGERY OPERATIVE NOTE  Date of Procedure: 11/03/2016  Preoperative Diagnosis:   Cardiac Arrest  S/P Coronary Artery Bypass Grafting x3  Postoperative Diagnosis: Same  Procedure:    Emergency Redo Sternotomy in Surgical Intensive Care Unit   Redo Coronary Artery Bypass Grafting x 1  Reversed Greater Saphenous Vein Graft to Second Obtuse Marginal Branch of Left Circumflex Coronary Artery  Open Vein Harvest from Left Thigh    Placement of Centrimag Bi-ventricular Assist Device     Placement of Left Femoral Arterial Line   Surgeon: Salvatore Decentlarence H. Cornelius Moraswen, MD  Assistants: Alleen BorneBryan K. Bartle, MD and Gwenith DailyEdward B. Tyrone SageGerhardt, MD  Anesthesia: Heather RobertsJames Singer, MD   BRIEF CLINICAL NOTE AND INDICATIONS FOR SURGERY  Patient is a 40 year old female who underwent coronary artery bypass grafting 3 earlier in the day on 11/03/2016. The patient was initially brought to the surgical intensive care unit in stable condition on no inotropic support. The patient's initial course over the next 2 hours was entirely uneventful. With very little warning the patient suddenly developed hypotension without any change in heart rhythm or other signs to suggest ongoing myocardial ischemia. Pulmonary artery pressures were relatively low. Attempts to treat hypotension with volume and vasopressors were not fruitful and the patient developed pulseless electrical activity. CPR was immediately begun. The patient was resuscitated with intravenous pressors including epinephrine.  Despite good pulse with CPR the patient's blood pressure remained refractory to resuscitation using volume and repeat doses of epinephrine and sodium bicarbonate.   DETAILS OF THE OPERATIVE PROCEDURE  At the bedside in the intensive care unit team rapidly prepared for emergency redo sternotomy. The anterior surface of the chest was quickly prepped with Betadine solution. The sternal incision was reopened with a 10 blade knife. All sternal  wires were removed. The retractor is placed. Open chest compressions or commenced. Good blood pressure is noted with CPR on the patient's arterial line. Repeat doses of intravenous epinephrine are administered. The patient developed ventricular fibrillation and was cardioverted using epicardial paddles. A regular narrow complex rhythm resumed but the patient remained in cardiogenic shock with no sustainable blood pressure. Preparations were made to return directly to the operating room.  The patient is transported directly to the operative with ongoing open chest CPR in progress. Once in the operating room the patient is placed in the supine position on the operative table. The anterior surface of the chest abdomen and both groins and thighs are prepared with Betadine solution while CPR was in progress. A retractor is replaced and pericardial sutures placed. Intravenous heparin is administered. The ascending aorta is cannulated for cardiac bony bypass. A two-stage venous cannula is placed through the right atrium. Cardiopulmonary bypass is resumed.  At this juncture the heart is allowed to rest on the ventilator. All of the previous bypass grafts are inspected. The vein graft to the left anterior descending coronary artery appears normal. There appears to be clot in the midportion of the vein graft to the left circumflex coronary artery. All of the proximal and distal anastomoses appeared intact with no obvious sign of kinking or mechanical obstruction. A decision is made to proceed with replacement of vein graft to the left circumflex system.  A femoral arterial line is placed in the patient's left common femoral artery using ultrasound guidance with the Seldinger technique for monitoring purposes.  A retrograde cardioplegia cannula is placed through the right atrium into the cornea sinus. An antegrade cardioplegia cannula is placed in the ascending aorta.  The  patient is allowed to cool passively to [facility]  systemic temperature.  The aortic cross clamp is applied and cold blood cardioplegia is delivered initially in an antegrade fashion through the aortic root.  Supplemental cardioplegia is given retrograde through the coronary sinus catheter.  Iced saline slush is applied for topical hypothermia.  The initial cardioplegic arrest is rapid with early diastolic arrest.  Repeat doses of cardioplegia are administered intermittently throughout the entire cross clamp portion of the operation through the aortic root, through the coronary sinus catheter, and through subsequently placed vein grafts in order to maintain completely flat electrocardiogram.   Redo Coronary Artery Bypass Grafting:   The second obtuse marginal branch of the left circumflex coronary artery was grafted using a reversed saphenous vein graft in an end-to-side fashion.  At the site of distal anastomosis the target vessel was good quality and measured approximately 1.4 mm in diameter.  With the arteriotomy open 1.0 probe will easily pass retrograde through the distal anastomosis of the previous vein graft to the left circumflex coronary artery documenting no mechanical problem at the distal anastomosis.  The proximal end of the vein graft to left anterior descending coronary artery is removed from the hood of the old vein graft to the left circumflex coronary artery. A small amount of clot is evacuated from the vein graft to the left circumflex coronary artery and good backbleeding is noted.  Both of the proximal vein graft anastomoses were placed directly to the ascending aorta prior to removal of the aortic cross clamp, including the new vein graft to the second obtuse marginal branch and the vein graft to left anterior descending coronary artery.  The proximal hood of the old vein graft to the left circumflex coronary artery was repaired, preserving the integrity of the graft.  The aortic cross clamp was removed after a total cross clamp time of  40 minutes.   Placement of Bi-ventricular Assist Devise:  All proximal and distal coronary anastomoses were inspected for hemostasis and appropriate graft orientation. Epicardial pacing wires are fixed to the right ventricular outflow tract and to the right atrial appendage. The patient is rewarmed to 37C temperature.  Milrinone and epinephrine infusions are begun. A ventilator is brought from the intensive care unit and inhaled nitric oxide therapy is begun.  During rewarming preparations were made for left ventricular assist device placement.  A 32 French right angle single stage venous cannula is passed through a separate stab incision in the chest wall to be utilized for left ventricular drainage via the apex. 2 concentric pledgeted pursestring sutures are placed in the left ventricular apex and the 32 French right angle venous cannula is placed through the apex into the left ventricular chamber.  The ventricular drainage cannula is connected to left ventricular assist device circuit.  The patient is weaned and disconnected from cardiopulmonary bypass.  The patient's rhythm at separation from bypass was AV paced.  The patient was weaned from cardiopulmonary bypass on epinephrine and milrinone infusions. The arterial line is quickly disconnected from the cardiopulmonary bypass circuit and connected to the left ventricular assist device circuit. Left ventricular assist is commenced. However, the patient develops progressively elevated pulmonary artery and central venous pressures with severe right ventricular chamber enlargement consistent with acute RV failure. Left ventricular assist support is stopped and the arterial line reconnected to the cardiopulmonary bypass circuit. Cardiopulmonary bypass is resumed.  A 7 mm arterial cannula is placed through a separate stab incision in the chest wall and  subsequently through double concentric pursestring sutures in the right ventricular outflow tract and the  tip of the cannula is passed through the pulmonic valve into the pulmonary artery.  The pulmonary artery cannula is connected to right ventricular assist device circuit.  The patient is weaned and disconnected from cardioplegic bypass. The patient's rhythm at separation from bypass was AV paced. The patient was initially weaned from bypass on epinephrine, dopamine and milrinone infusions with nitric oxide at 30 ppm. The arterial line is disconnected from the cardiopulmonary bypass circuit and connected to the left ventricular assist device circuit. Left ventricular assist support is commenced. The venous drainage line is disconnected from the cardioplegic bypass circuit and directly connected to the right ventricular assist device circuit. Right ventricular assist support is commenced approximately 1 minute after left ventricular assist support and was commenced.  The entire duration of cardioplegia bypass for the procedure was 184 minutes.  Once biventricular assist device support was commenced the patient's hemodynamics stabilized fairly quickly. Left and right ventricular assist device flows were excellent, ranging between 4 and 5 L/m. Pulmonary artery and central venous pressures gradually decreased. Epinephrine and dopamine infusions were stopped.  All of the cannula sites were carefully inspected and there appeared to be excellent hemostasis even prior to protamine administration. Protamine was administered to reverse the anticoagulation. The patient received a total of 2 packs adult platelets, 4 units fresh frozen plasma, and 2 packs of cryoprecipitate due to coagulopathy and thrombocytopenia after separation from cardiopulmonary bypass and reversal of heparin with protamine.  The patient received 6 units packed red blood cells during the procedure due to anemia which was present preoperatively and exacerbated by acute blood loss and hemodilution during cardiopulmonary bypass.  The patient was  monitored in the operating room for an extended period of time and remained remarkably stable on biventricular support. Arterial blood gases field remarkably good oxygenation with relatively low airway pressures.  The mediastinum and pleural space were inspected for hemostasis and irrigated with saline solution.  The sternum was left open and covered with an Esmark dressing that was sewn circumferentially with Prolene suture. A wound VAC sponge was placed over the top of the Esmark dressing to maintain a clean field.    Disposition:  The patient was transported to the surgical intensive care in critical but stable condition. There are no intraoperative complications. All sponge instrument and needle counts are verified correct at completion of the operation.    Salvatore Decentlarence H. Cornelius Moraswen MD 11/03/2016 10:19 PM

## 2016-11-03 NOTE — Anesthesia Procedure Notes (Signed)
Procedures

## 2016-11-03 NOTE — Transfer of Care (Signed)
Immediate Anesthesia Transfer of Care Note  Patient: Christina Morrison  Procedure(s) Performed: Procedure(s): EMERGENCY REDO STERNOTOMY IN ICU, REDO CABG X 1, PLACEMENT OF BILATERAL VAD, PLACEMENT OF LEFT FEMORAL ARTERIAL LINE (N/A)  Patient Location: SICU  Anesthesia Type:General  Level of Consciousness: Patient remains intubated per anesthesia plan  Airway & Oxygen Therapy: Patient remains intubated per anesthesia plan and Patient placed on Ventilator (see vital sign flow sheet for setting)  Post-op Assessment: Report given to RN and Post -op Vital signs reviewed and stable  Post vital signs: Reviewed and stable  Last Vitals:  Vitals:   11/03/16 1530 11/03/16 1545  BP:    Pulse: 84   Resp: (!) 22 18  Temp: (!) 35.5 C     Last Pain:  Vitals:   11/03/16 0600  TempSrc:   PainSc: 5      HR 99, MAP 78  Patients Stated Pain Goal: 0 (11/02/16 2000)  Complications: No apparent anesthesia complications

## 2016-11-04 ENCOUNTER — Encounter (HOSPITAL_COMMUNITY): Payer: Self-pay | Admitting: Thoracic Surgery (Cardiothoracic Vascular Surgery)

## 2016-11-04 ENCOUNTER — Inpatient Hospital Stay (HOSPITAL_COMMUNITY): Payer: Medicaid Other

## 2016-11-04 DIAGNOSIS — I469 Cardiac arrest, cause unspecified: Secondary | ICD-10-CM

## 2016-11-04 DIAGNOSIS — Z95811 Presence of heart assist device: Secondary | ICD-10-CM

## 2016-11-04 DIAGNOSIS — R570 Cardiogenic shock: Secondary | ICD-10-CM

## 2016-11-04 DIAGNOSIS — Z951 Presence of aortocoronary bypass graft: Secondary | ICD-10-CM

## 2016-11-04 LAB — PREPARE FRESH FROZEN PLASMA
UNIT DIVISION: 0
UNIT DIVISION: 0
UNIT DIVISION: 0
Unit division: 0

## 2016-11-04 LAB — COOXEMETRY PANEL
CARBOXYHEMOGLOBIN: 1.1 % (ref 0.5–1.5)
CARBOXYHEMOGLOBIN: 1.5 % (ref 0.5–1.5)
METHEMOGLOBIN: 2.2 % — AB (ref 0.0–1.5)
METHEMOGLOBIN: 1.6 % — AB (ref 0.0–1.5)
O2 SAT: 61 %
O2 Saturation: 66.3 %
TOTAL HEMOGLOBIN: 6.6 g/dL — AB (ref 12.0–16.0)
TOTAL HEMOGLOBIN: 11.5 g/dL — AB (ref 12.0–16.0)

## 2016-11-04 LAB — POCT I-STAT 3, ART BLOOD GAS (G3+)
ACID-BASE EXCESS: 6 mmol/L — AB (ref 0.0–2.0)
Acid-Base Excess: 6 mmol/L — ABNORMAL HIGH (ref 0.0–2.0)
Bicarbonate: 30.5 mmol/L — ABNORMAL HIGH (ref 20.0–28.0)
Bicarbonate: 31.5 mmol/L — ABNORMAL HIGH (ref 20.0–28.0)
O2 SAT: 100 %
O2 Saturation: 99 %
PCO2 ART: 41.7 mmHg (ref 32.0–48.0)
PH ART: 7.431 (ref 7.350–7.450)
PO2 ART: 251 mmHg — AB (ref 83.0–108.0)
PO2 ART: 156 mmHg — AB (ref 83.0–108.0)
Patient temperature: 36.2
TCO2: 32 mmol/L (ref 0–100)
TCO2: 33 mmol/L (ref 0–100)
pCO2 arterial: 47.2 mmHg (ref 32.0–48.0)
pH, Arterial: 7.469 — ABNORMAL HIGH (ref 7.350–7.450)

## 2016-11-04 LAB — BASIC METABOLIC PANEL
Anion gap: 10 (ref 5–15)
BUN: 10 mg/dL (ref 6–20)
CHLORIDE: 106 mmol/L (ref 101–111)
CO2: 28 mmol/L (ref 22–32)
CREATININE: 1.02 mg/dL — AB (ref 0.44–1.00)
Calcium: 6.6 mg/dL — ABNORMAL LOW (ref 8.9–10.3)
GFR calc Af Amer: >60 mL/min (ref >60)
GFR calc non Af Amer: >60 mL/min (ref >60)
GLUCOSE: 149 mg/dL — AB (ref 65–99)
POTASSIUM: 3.7 mmol/L (ref 3.5–5.1)
SODIUM: 144 mmol/L (ref 135–145)

## 2016-11-04 LAB — POCT I-STAT, CHEM 8
BUN: 16 mg/dL (ref 6–20)
Calcium, Ion: 0.97 mmol/L — ABNORMAL LOW (ref 1.15–1.40)
Chloride: 100 mmol/L — ABNORMAL LOW (ref 101–111)
Creatinine, Ser: 0.9 mg/dL (ref 0.44–1.00)
Glucose, Bld: 108 mg/dL — ABNORMAL HIGH (ref 65–99)
HEMATOCRIT: 25 % — AB (ref 36.0–46.0)
HEMOGLOBIN: 8.5 g/dL — AB (ref 12.0–15.0)
Potassium: 3.4 mmol/L — ABNORMAL LOW (ref 3.5–5.1)
SODIUM: 144 mmol/L (ref 135–145)
TCO2: 30 mmol/L (ref 0–100)

## 2016-11-04 LAB — GLUCOSE, CAPILLARY
GLUCOSE-CAPILLARY: 104 mg/dL — AB (ref 65–99)
GLUCOSE-CAPILLARY: 108 mg/dL — AB (ref 65–99)
GLUCOSE-CAPILLARY: 110 mg/dL — AB (ref 65–99)
GLUCOSE-CAPILLARY: 111 mg/dL — AB (ref 65–99)
GLUCOSE-CAPILLARY: 111 mg/dL — AB (ref 65–99)
GLUCOSE-CAPILLARY: 115 mg/dL — AB (ref 65–99)
GLUCOSE-CAPILLARY: 118 mg/dL — AB (ref 65–99)
GLUCOSE-CAPILLARY: 121 mg/dL — AB (ref 65–99)
GLUCOSE-CAPILLARY: 127 mg/dL — AB (ref 65–99)
GLUCOSE-CAPILLARY: 98 mg/dL (ref 65–99)
Glucose-Capillary: 133 mg/dL — ABNORMAL HIGH (ref 65–99)
Glucose-Capillary: 156 mg/dL — ABNORMAL HIGH (ref 65–99)
Glucose-Capillary: 144 mg/dL — ABNORMAL HIGH (ref 65–99)
Glucose-Capillary: 120 mg/dL — ABNORMAL HIGH (ref 65–99)
Glucose-Capillary: 120 mg/dL — ABNORMAL HIGH (ref 65–99)
Glucose-Capillary: 110 mg/dL — ABNORMAL HIGH (ref 65–99)
Glucose-Capillary: 108 mg/dL — ABNORMAL HIGH (ref 65–99)
Glucose-Capillary: 133 mg/dL — ABNORMAL HIGH (ref 65–99)
Glucose-Capillary: 104 mg/dL — ABNORMAL HIGH (ref 65–99)
Glucose-Capillary: 126 mg/dL — ABNORMAL HIGH (ref 65–99)
Glucose-Capillary: 114 mg/dL — ABNORMAL HIGH (ref 65–99)
Glucose-Capillary: 105 mg/dL — ABNORMAL HIGH (ref 65–99)
Glucose-Capillary: 94 mg/dL (ref 65–99)
Glucose-Capillary: 107 mg/dL — ABNORMAL HIGH (ref 65–99)
Glucose-Capillary: 111 mg/dL — ABNORMAL HIGH (ref 65–99)

## 2016-11-04 LAB — PREPARE CRYOPRECIPITATE
UNIT DIVISION: 0
Unit division: 0

## 2016-11-04 LAB — CBC
HCT: 18 % — ABNORMAL LOW (ref 36.0–46.0)
HCT: 25.6 % — ABNORMAL LOW (ref 36.0–46.0)
HEMATOCRIT: 27.7 % — AB (ref 36.0–46.0)
HEMOGLOBIN: 6.4 g/dL — AB (ref 12.0–15.0)
HEMOGLOBIN: 9.8 g/dL — AB (ref 12.0–15.0)
Hemoglobin: 9 g/dL — ABNORMAL LOW (ref 12.0–15.0)
MCH: 30.6 pg (ref 26.0–34.0)
MCH: 30.3 pg (ref 26.0–34.0)
MCH: 30.3 pg (ref 26.0–34.0)
MCHC: 35.6 g/dL (ref 30.0–36.0)
MCHC: 35.2 g/dL (ref 30.0–36.0)
MCHC: 35.4 g/dL (ref 30.0–36.0)
MCV: 86.1 fL (ref 78.0–100.0)
MCV: 86.2 fL (ref 78.0–100.0)
MCV: 85.8 fL (ref 78.0–100.0)
PLATELETS: 116 10*3/uL — AB (ref 150–400)
Platelets: 114 10*3/uL — ABNORMAL LOW (ref 150–400)
Platelets: 103 10*3/uL — ABNORMAL LOW (ref 150–400)
RBC: 2.09 MIL/uL — AB (ref 3.87–5.11)
RBC: 2.97 MIL/uL — ABNORMAL LOW (ref 3.87–5.11)
RBC: 3.23 MIL/uL — AB (ref 3.87–5.11)
RDW: 13.8 % (ref 11.5–15.5)
RDW: 14 % (ref 11.5–15.5)
RDW: 14.3 % (ref 11.5–15.5)
WBC: 10.4 10*3/uL (ref 4.0–10.5)
WBC: 10.1 10*3/uL (ref 4.0–10.5)
WBC: 12.6 10*3/uL — AB (ref 4.0–10.5)

## 2016-11-04 LAB — POCT ACTIVATED CLOTTING TIME
ACTIVATED CLOTTING TIME: 0 s
ACTIVATED CLOTTING TIME: 125 s
ACTIVATED CLOTTING TIME: 136 s
ACTIVATED CLOTTING TIME: 158 s
ACTIVATED CLOTTING TIME: 169 s
ACTIVATED CLOTTING TIME: 169 s
ACTIVATED CLOTTING TIME: 169 s
ACTIVATED CLOTTING TIME: 164 s
ACTIVATED CLOTTING TIME: 175 s
ACTIVATED CLOTTING TIME: 175 s
ACTIVATED CLOTTING TIME: 175 s
ACTIVATED CLOTTING TIME: 175 s
Activated Clotting Time: 136 seconds
Activated Clotting Time: 164 seconds
Activated Clotting Time: 158 seconds
Activated Clotting Time: 169 seconds
Activated Clotting Time: 175 seconds

## 2016-11-04 LAB — PREPARE PLATELET PHERESIS
UNIT DIVISION: 0
Unit division: 0

## 2016-11-04 LAB — HEPARIN LEVEL (UNFRACTIONATED): Heparin Unfractionated: <0.1 IU/mL — ABNORMAL LOW (ref 0.30–0.70)

## 2016-11-04 LAB — PROTIME-INR
INR: 1.62
PROTHROMBIN TIME: 19.4 s — AB (ref 11.4–15.2)

## 2016-11-04 LAB — CREATININE, SERUM: Creatinine, Ser: 1 mg/dL (ref 0.44–1.00)

## 2016-11-04 LAB — PREPARE RBC (CROSSMATCH)

## 2016-11-04 LAB — HEMOGLOBIN AND HEMATOCRIT, BLOOD
HEMATOCRIT: 29.7 % — AB (ref 36.0–46.0)
HEMOGLOBIN: 10.4 g/dL — AB (ref 12.0–15.0)

## 2016-11-04 LAB — FIBRINOGEN: Fibrinogen: 84 mg/dL — CL (ref 210–475)

## 2016-11-04 LAB — MAGNESIUM
MAGNESIUM: 2.2 mg/dL (ref 1.7–2.4)
Magnesium: 2.9 mg/dL — ABNORMAL HIGH (ref 1.7–2.4)

## 2016-11-04 LAB — APTT: APTT: 41 s — AB (ref 24–36)

## 2016-11-04 MED ORDER — SODIUM CHLORIDE 0.9 % IV SOLN
Freq: Once | INTRAVENOUS | Status: AC
  Administered 2016-11-04: 22:00:00 via INTRAVENOUS

## 2016-11-04 MED ORDER — ALBUMIN HUMAN 5 % IV SOLN
INTRAVENOUS | Status: AC
  Filled 2016-11-04: qty 250

## 2016-11-04 MED ORDER — FUROSEMIDE 10 MG/ML IJ SOLN
20.0000 mg | Freq: Four times a day (QID) | INTRAMUSCULAR | Status: DC
  Administered 2016-11-05: 20 mg via INTRAVENOUS
  Administered 2016-11-05: 10 mg via INTRAVENOUS
  Administered 2016-11-05 – 2016-11-07 (×5): 20 mg via INTRAVENOUS
  Filled 2016-11-04 (×7): qty 2

## 2016-11-04 MED ORDER — SODIUM CHLORIDE 0.9 % IV SOLN: Freq: Once | INTRAVENOUS | Status: AC

## 2016-11-04 MED ORDER — FAMOTIDINE IN NACL 20-0.9 MG/50ML-% IV SOLN
20.0000 mg | Freq: Every day | INTRAVENOUS | Status: DC
  Administered 2016-11-04: 20 mg via INTRAVENOUS
  Filled 2016-11-04: qty 50

## 2016-11-04 MED ORDER — POTASSIUM CHLORIDE 10 MEQ/50ML IV SOLN
10.0000 meq | INTRAVENOUS | Status: AC
  Administered 2016-11-04 (×3): 10 meq via INTRAVENOUS
  Filled 2016-11-04 (×3): qty 50

## 2016-11-04 MED ORDER — ALBUMIN HUMAN 5 % IV SOLN
250.0000 mL | INTRAVENOUS | Status: AC | PRN
  Administered 2016-11-05: 250 mL via INTRAVENOUS
  Filled 2016-11-04: qty 250

## 2016-11-04 MED ORDER — HEPARIN (PORCINE) IN NACL 100-0.45 UNIT/ML-% IJ SOLN
500.0000 [IU]/h | INTRAMUSCULAR | Status: DC
  Administered 2016-11-05: 1100 [IU]/h via INTRAVENOUS
  Filled 2016-11-04: qty 250

## 2016-11-04 MED ORDER — FENTANYL 2500MCG IN NS 250ML (10MCG/ML) PREMIX INFUSION
0.0000 ug/h | INTRAVENOUS | Status: DC
  Administered 2016-11-04: 300 ug/h via INTRAVENOUS
  Administered 2016-11-04: 150 ug/h via INTRAVENOUS
  Administered 2016-11-05 – 2016-11-06 (×4): 325 ug/h via INTRAVENOUS
  Administered 2016-11-06: 50 ug/h via INTRAVENOUS
  Administered 2016-11-06 (×2): 325 ug/h via INTRAVENOUS
  Filled 2016-11-04 (×11): qty 250

## 2016-11-04 MED ORDER — SODIUM CHLORIDE 0.9 % IV SOLN
1.0000 mg/h | INTRAVENOUS | Status: DC
  Administered 2016-11-04: 2 mg/h via INTRAVENOUS
  Administered 2016-11-04 – 2016-11-05 (×2): 3 mg/h via INTRAVENOUS
  Administered 2016-11-06: 4 mg/h via INTRAVENOUS
  Administered 2016-11-06: 2 mg/h via INTRAVENOUS
  Administered 2016-11-07 (×2): 3 mg/h via INTRAVENOUS
  Filled 2016-11-04 (×7): qty 10

## 2016-11-04 MED ORDER — SODIUM CHLORIDE 0.9% FLUSH
10.0000 mL | Freq: Two times a day (BID) | INTRAVENOUS | Status: DC
  Administered 2016-11-05: 10 mL

## 2016-11-04 MED ORDER — FUROSEMIDE 10 MG/ML IJ SOLN
40.0000 mg | Freq: Three times a day (TID) | INTRAMUSCULAR | Status: DC
  Administered 2016-11-04 (×2): 40 mg via INTRAVENOUS

## 2016-11-04 MED ORDER — SODIUM CHLORIDE 0.9 % IV SOLN
Freq: Once | INTRAVENOUS | Status: AC
  Administered 2016-11-04: 04:00:00 via INTRAVENOUS

## 2016-11-04 MED ORDER — SODIUM CHLORIDE 0.9% FLUSH: 10.0000 mL | INTRAVENOUS | Status: DC | PRN

## 2016-11-04 MED ORDER — MIDAZOLAM BOLUS VIA INFUSION
1.0000 mg | INTRAVENOUS | Status: DC | PRN
  Administered 2016-11-04 (×2): 1 mg via INTRAVENOUS
  Filled 2016-11-04: qty 1

## 2016-11-04 MED ORDER — FUROSEMIDE 10 MG/ML IJ SOLN: 20.0000 mg | Freq: Three times a day (TID) | INTRAMUSCULAR | Status: DC

## 2016-11-04 MED FILL — Heparin Sodium (Porcine) Inj 1000 Unit/ML: INTRAMUSCULAR | Qty: 30 | Status: AC

## 2016-11-04 MED FILL — Calcium Chloride Inj 10%: INTRAVENOUS | Qty: 10 | Status: AC

## 2016-11-04 MED FILL — Sodium Bicarbonate IV Soln 8.4%: INTRAVENOUS | Qty: 50 | Status: AC

## 2016-11-04 MED FILL — Electrolyte-R (PH 7.4) Solution: INTRAVENOUS | Qty: 3000 | Status: AC

## 2016-11-04 MED FILL — Heparin Sodium (Porcine) Inj 1000 Unit/ML: INTRAMUSCULAR | Qty: 10 | Status: AC

## 2016-11-04 MED FILL — Mannitol IV Soln 20%: INTRAVENOUS | Qty: 500 | Status: AC

## 2016-11-04 MED FILL — Lidocaine HCl IV Inj 20 MG/ML: INTRAVENOUS | Qty: 15 | Status: AC

## 2016-11-04 MED FILL — Electrolyte-R (PH 7.4) Solution: INTRAVENOUS | Qty: 6000 | Status: AC

## 2016-11-04 MED FILL — Sodium Chloride IV Soln 0.9%: INTRAVENOUS | Qty: 2000 | Status: AC

## 2016-11-04 MED FILL — Albumin, Human Inj 5%: INTRAVENOUS | Qty: 250 | Status: AC

## 2016-11-04 MED FILL — Mannitol IV Soln 20%: INTRAVENOUS | Qty: 2000 | Status: AC

## 2016-11-04 MED FILL — Medication: Qty: 1 | Status: AC

## 2016-11-04 MED FILL — Sodium Chloride IV Soln 0.9%: INTRAVENOUS | Qty: 6000 | Status: AC

## 2016-11-04 NOTE — Progress Notes (Signed)
11/04/2016 2:23 PM Dr. Cornelius Moraswen on floor to see patient. Verbal orders received if patient requires more volume, give one more unit packed red cells. Orders placed. Will continue to closely monitor patient.  Henning Ehle, Blanchard KelchStephanie Ingold

## 2016-11-04 NOTE — Progress Notes (Signed)
Anesthesiology Follow-up:  40 year old female one day S/P CABG followed by emergency reexploration and placement of LVAD and RVAD.  Maintaining hemodynamic stability with biventricular VAD support. Only on neosynephrine for BP support, other pressors weaned off. Sedated on vent but she has been following commands.   VS: T- 37.1 BP-87/83 (85) HR- 82 (SR) PA 30/22 CVP-15  CO/CI 3.3/1.9  LVAD pump speed 3400 rpm flow-5.08 lpm RVAD pump speed 2800 flow- 4.24 lpm  FiO2 0.50 PRVC TV- 450 rate 12 peep-5  PH 7.43 PCO2 -47 PO2- 156 K-3.4 BUN/Cr. 16/1.0 glucose-108 WBC- 12,600 H/H- 9.8/27.7 Platelets- 103,000  Patients looks remarkably good considering the events of the last 36 hours,  respiratory function stable with good oxygenation and pulmonary compliance, renal function stable, required four units PRBCs today. Plan continued VAD support with possible explant on Friday.  Kipp Broodavid Amritpal Shropshire

## 2016-11-04 NOTE — Progress Notes (Signed)
eLink Physician-Brief Progress Note Patient Name: Christina Morrison DOB: 02-29-1976 MRN: [id number]   Date of Service  11/04/2016  HPI/Events of Note  On vent  eICU Interventions  Needs stress ulcer prx        Erin FullingKurian Dresean Beckel 11/04/2016, 10:28 PM

## 2016-11-04 NOTE — Plan of Care (Signed)
Problem: Activity: Goal: Risk for activity intolerance will decrease Outcome: Not Progressing Pt is too unstable to turn  Problem: Cardiac: Goal: Ability to maintain an adequate cardiac output will improve Outcome: Not Progressing Pt has BiVAD and inotropic support

## 2016-11-04 NOTE — Progress Notes (Signed)
Advanced Heart Failure Rounding Note   Subjective:    40 y/o woman with HTN, CAD and tobacco use. Underwent stenting of LCX in 1/17. Readmitted for NSTEMI. Found to have 2v CAD with high-grade ostial LAD and LCX stensosis. Underwent CABG with Dr. Roxy Manns with LIMA to LAD, SVG to DIAG and SVG to LCX. Developed VF arrest and had re-do sternotomy begun at bedside with cardia massage. 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 Underwent re-do CABG x1 with SVG to OM and placement of biVAD support due to shock.   Remains on bivad support. Intubated on 50% FiO2 but responds to commands. Good urine output. On milrinone 0.25 and phenylephrine 45  MAP 75-80  LVAD speed 3400 Flow 5.1 L/min RVAD speed 2700 4.1 L/min  Swan # RA 12 PA 32/22 (27) CO/CI  3.4/2.0    Objective:   Weight Range:  Vital Signs:   Temp:  [93.9 F (34.4 C)-99.1 F (37.3 C)] 98.4 F (36.9 C) (11/21 1745) Pulse Rate:  [25-101] 83 (11/21 1745) Resp:  [3-25] 15 (11/21 1745) BP: (82-113)/(69-83) 82/79 (11/21 0808) SpO2:  [60 %-100 %] 100 % (11/21 1745) Arterial Line BP: (65-111)/(55-97) 77/74 (11/21 1745) FiO2 (%):  [50 %-100 %] 50 % (11/21 1600) Weight:  [71.8 kg (158 lb 4.6 oz)-82.4 kg (181 lb 10.5 oz)] 82.4 kg (181 lb 10.5 oz) (11/21 0500) Last BM Date:  (preop)  Weight change: Filed Weights   10/31/16 0251 11/03/16 2145 11/04/16 0500  Weight: 71.8 kg (158 lb 4.6 oz) 71.8 kg (158 lb 4.6 oz) 82.4 kg (181 lb 10.5 oz)    Intake/Output:   Intake/Output Summary (Last 24 hours) at 11/04/16 1800 Last data filed at 11/04/16 1730  Gross per 24 hour  Intake         11936.53 ml  Output            12240 ml  Net          -303.47 ml     Physical Exam: General:  Intubated. Sedated  HEENT: normal ETT Neck: supple.RIJ swan Cor: Chest open with wound vac. Bilateral VADs Lungs: coarse Abdomen: soft, nontender, +distended. quiet Extremities: warm 2+ edema Neuro: intubated sedated   Telemetry: SR 860s (viewed  personally)  Labs: Basic Metabolic Panel:  Recent Labs Lab 10/30/16 2000 11/01/16 0636 11/02/16 0037 11/03/16 0406  11/03/16 1706 11/03/16 1739 11/03/16 1953 11/03/16 2131 11/04/16 0300 11/04/16 1613 11/04/16 1700  NA 137 141 141 140  < > 139 140 146* 146* 144 144  --   K 3.5 3.8 3.7 3.4*  < > 4.3 5.8* 2.9* 3.4* 3.7 3.4*  --   CL 105 108 109 107  < > 97* 100* 100*  --  106 100*  --   CO2 '24 26 25 25  '$ --   --   --   --   --  28  --   --   GLUCOSE 110* 103* 118* 106*  < > 406* 322* 147* 114* 149* 108*  --   BUN 6 5* 6 5*  < > 5* 6 6  --  10 16  --   CREATININE 0.68 0.67 0.73 0.79  < > 0.70 0.70 0.80  --  1.02* 0.90 1.00  CALCIUM 9.1 8.7* 9.1 8.8*  --   --   --   --   --  6.6*  --   --   MG  --   --   --   --   --   --   --   --   --  2.9*  --  2.2  < > = values in this interval not displayed.  Liver Function Tests:  Recent Labs Lab 10/30/16 2000 11/02/16 0037  AST 19 18  ALT 15 16  ALKPHOS 106 103  BILITOT 0.7 0.5  PROT 6.8 6.7  ALBUMIN 4.1 3.7   No results for input(s): LIPASE, AMYLASE in the last 168 hours. No results for input(s): AMMONIA in the last 168 hours.  CBC:  Recent Labs Lab 10/30/16 2000  11/03/16 1331  11/03/16 2208 11/03/16 2209 11/04/16 0300 11/04/16 0630 11/04/16 1613 11/04/16 1700  WBC 5.8  < > 15.7*  --   --  20.5* 10.4 10.1  --  12.6*  NEUTROABS 3.9  --   --   --   --   --   --   --   --   --   HGB 13.3  < > 9.8*  < >  --  11.7* 6.4* 9.0* 8.5* 9.8*  HCT 38.0  < > 28.3*  < >  --  33.4* 18.0* 25.6* 25.0* 27.7*  MCV 88.2  < > 89.8  --   --  87.4 86.1 86.2  --  85.8  PLT 249  < > 196  < > 150 151 114* 116*  --  103*  < > = values in this interval not displayed.  Cardiac Enzymes:  Recent Labs Lab 10/31/16 0023 10/31/16 0225 10/31/16 1036 11/01/16 1150 11/02/16 0827  TROPONINI 0.09* 0.09* 0.06* 0.04* <0.03    BNP: BNP (last 3 results) No results for input(s): BNP in the last 8760 hours.  ProBNP (last 3 results) No results  for input(s): PROBNP in the last 8760 hours.    Other results:  Imaging: Dg Chest Port 1 View  Result Date: 11/04/2016 CLINICAL DATA:  History of cardiac arrest and status post CABG. Placement of biventricular assist device. EXAM: PORTABLE CHEST 1 VIEW COMPARISON:  11/03/2016 FINDINGS: Endotracheal tube is appropriately positioned, 4.1 cm above the carina. Swan-Ganz catheter in the region of the distal main pulmonary artery. Again noted are bilateral chest tubes. There is tubing over the heart compatible with a biventricular assist device. No evidence for a large pneumothorax. Heart size is stable and within normal limits for size. Again noted is the left subclavian central line that is extending cephalad and the tip is beyond the image. Slightly improved aeration in lungs suggest decreased edema. Nasogastric tube extends into the abdomen. IMPRESSION: Slightly improved aeration of the lungs may represent decreased pulmonary edema. Minimal change in the support apparatuses as described. Again, the left subclavian central line extends cephalad and the tip is beyond the image. Negative for pneumothorax. Electronically Signed   By: Markus Daft M.D.   On: 11/04/2016 08:00   Dg Chest Port 1 View  Result Date: 11/03/2016 CLINICAL DATA:  Postop emergency surgery EXAM: PORTABLE CHEST 1 VIEW COMPARISON:  11/03/2016 FINDINGS: Endotracheal tube tip is approximately 4.4 cm superior to the carina. Right IJ Swan-Ganz catheter tip projects over the left main pulmonary artery region. Interim removal of esophageal tube. Repositioning or replacement of bilateral chest drainage catheters. A mediastinal drain is again evident. Left-sided catheter tubing courses over the left neck cephalad and is similar compared to prior. Multiple additional coital like opacities overlie the thorax. There are low lung volumes. Right greater than left hazy bilateral edema or infiltrates. No effusion. Removal of sternotomy wires. Stable  heart size. Mild lucency at the left cardiac apex and right CP angle could be  related to postoperative air. IMPRESSION: 1. Support lines and tubes as described above. There is a catheter that overlies the left neck and the tip is presumably directed cephalad, recommend clinical correlation. 2. Low lung volumes with hazy right greater than left edema or infiltrates. 3. Mild lucencies at the right cardiophrenic angle and left cardiac apex could be related to postop status. Electronically Signed   By: Donavan Foil M.D.   On: 11/03/2016 21:17   Dg Chest Port 1 View  Result Date: 11/03/2016 CLINICAL DATA:  CABG. EXAM: PORTABLE CHEST 1 VIEW COMPARISON:  10/30/2016. FINDINGS: Endotracheal tube, NG tube, mediastinal drainage catheters, bilateral chest tubes in stable position. Prior CABG. Heart size normal. No focal infiltrate. No pleural effusion or pneumothorax. IMPRESSION: 1. Lines and tubes including bilateral chest tubes in good anatomic position. No pneumothorax. 2. Prior CABG.  Heart size normal. Electronically Signed   By: [place]   On: 11/03/2016 15:02      Medications:     Scheduled Medications: . albumin human      . aspirin EC  325 mg Oral Daily   Or  . aspirin  324 mg Per Tube Daily  . cefUROXime (ZINACEF)  IV  1.5 g Intravenous Q12H  . chlorhexidine gluconate (MEDLINE KIT)  15 mL Mouth Rinse BID  . furosemide  20 mg Intravenous Q6H  . insulin regular  0-10 Units Intravenous TID WC  . mouth rinse  15 mL Mouth Rinse 10 times per day  . mupirocin ointment  1 application Topical BID  . potassium chloride  10 mEq Intravenous Q1 Hr x 3  . potassium chloride  10 mEq Intravenous Q1 Hr x 3  . [START ON 11/05/2016] sodium chloride flush  10-40 mL Intracatheter Q12H  . sodium chloride flush  3 mL Intravenous Q12H  . vancomycin  1,000 mg Intravenous Q12H     Infusions: . sodium chloride 20 mL/hr at 11/04/16 1000  . sodium chloride    . sodium chloride 20 mL/hr at 11/04/16 1700   . dexmedetomidine 1.2 mcg/kg/hr (11/04/16 1700)  . DOPamine Stopped (11/03/16 2200)  . EPINEPHrine 4 mg in dextrose 5% 250 mL infusion (16 mcg/mL) Stopped (11/03/16 2200)  . fentaNYL infusion INTRAVENOUS 300 mcg/hr (11/04/16 1700)  . heparin 1,100 Units/hr (11/04/16 1700)  . insulin (NOVOLIN-R) infusion 1.6 Units/hr (11/04/16 1700)  . lactated ringers 20 mL/hr at 11/04/16 1700  . lactated ringers 20 mL/hr at 11/04/16 1700  . midazolam (VERSED) infusion 3 mg/hr (11/04/16 1700)  . milrinone 0.375 mcg/kg/min (11/04/16 1700)  . nitroGLYCERIN Stopped (11/03/16 2352)  . norepinephrine (LEVOPHED) Adult infusion    . phenylephrine (NEO-SYNEPHRINE) Adult infusion 50 mcg/min (11/04/16 1700)  . vasopressin (PITRESSIN) infusion - *FOR SHOCK*       PRN Medications:  sodium chloride, albumin human, heparin, lactated ringers, metoprolol, midazolam, morphine injection, ondansetron (ZOFRAN) IV, sodium chloride flush, sodium chloride flush, vasopressin (PITRESSIN) infusion - *FOR SHOCK*   Assessment:   1. Cardiogenic shock with acute biventricular failure 2. VF arrest 3. CAD s/p CABG with emergent re-do 4. NSTEMI 5. Acute respiratory failure 6. Acute blood loss anemia 7. Hypokalemia  Plan/Discussion:    She is critically ill requiring full biVAD support. Currently flows look good and cardiac output is acceptable. Continue pressor support. Will diurese gently as tolerated to keep oxygenation stable. I have d/w Dr. Roxy Manns and plan on keeping VADs in place until at least Friday to allow time for ventricular recovery. Will attempt to wean flows  slowly. If tolerating may be able to explant Friday, if not maybe Monday. Can consider TEE or limited apical echo at some point to assess ventricular function. Currently maintaining NSR. Replace blood products as needed.   We will be available 24/7 to support the surgical team as needed.   The patient is critically ill with multiple organ systems failure and  requires high complexity decision making for assessment and support, frequent evaluation and titration of therapies, application of advanced monitoring technologies and extensive interpretation of multiple databases.   Critical Care Time devoted to patient care services described in this note is 45 Minutes.   Length of Stay: 4   Talisa Petrak MD 11/04/2016, 6:00 PM  Advanced Heart Failure Team Pager [phone] (M-F; 7a - 4p)  Please contact [facility] for night-coverage after hours (4p -7a ) and weekends on [email or web address]

## 2016-11-04 NOTE — Progress Notes (Signed)
      [address]       [place] [id number]             [phone]      Intubated, sedated, but does respond and follow commands  On BIVAD. Flow from RVAD= 4.3, LVAD= 5.2  BP (!) 82/79   Pulse 83   Temp 98.4 F (36.9 C) (Core (Comment))   Resp 15   Ht 5\' 2"  (1.575 m)   Wt 181 lb 10.5 oz (82.4 kg)   LMP 10/27/2016 (Exact Date)   SpO2 100%   BMI 33.23 kg/m    Intake/Output Summary (Last 24 hours) at 11/04/16 1759 Last data filed at 11/04/16 1745  Gross per 24 hour  Intake         12086.53 ml  Output            12240 ml  Net          -153.47 ml   PA= 24/17 CI= 1.8  Hct 27  Receiving 2 units PRBC- 2nd infusing now.  Salvatore DecentSteven C. Dorris FetchHendrickson, MD [facility] [phone]

## 2016-11-04 NOTE — Addendum Note (Signed)
Addendum  created 11/04/16 2008 by Kipp Broodavid Blayne Frankie, MD   Sign clinical note

## 2016-11-04 NOTE — Progress Notes (Signed)
Initial Nutrition Assessment  DOCUMENTATION CODES:   Not applicable  INTERVENTION:   Recommend Vital 1.2 at goal rate of 6455ml/hr and Prostat SF once a day. Tfs plus Prostat will provide 1684kcal/day (106% estimated needs) and provide 114g/day protein (meets 99-103% estimated needs) free water 102970ml/day, fiber 6.7g/day.    NUTRITION DIAGNOSIS:   Inadequate oral intake related to other (see comment) (intubated on vent) as evidenced by NPO status.  GOAL:   Patient will meet greater than or equal to 90% of their needs (when able to start feedings)  MONITOR:   Vent status, Weight trends  REASON FOR ASSESSMENT:   Ventilator    ASSESSMENT:   40 y.o. female s/p CABG x 3 11/20. Coded as soon as she arrived to 2S. Sternotomy done at bedside and she was taken back to the OR for emergent redo CABG x 1.   Patient is currently intubated on ventilator support MV: 10.3L/min Temp (24hrs), Avg:97.8 F (36.6 C), Min:93.9 F (34.4 C), Max:99.1 F (37.3 C)  Medications reviewed and include: aspirin, Zinacef, lasix, insulin, vancomycin, heparin, albumin, Kcl, neo-synephrine   Labs reviewed: creat 1.02(H), Ca 6.6(L), Mg 2.9(H), iCa 0.96(L) Hgb 9.0(L), Hct 25.6(L), methemoglobin 2.2(H) BG- 114, 147, 149, 322, 406 X 24 hrs, AIC 4.9 wnl 11/19    Nutrition-Focused physical exam completed. Findings are no fat depletion, no muscle depletion, and moderate generalized edema.   Diet Order:  Diet NPO time specified  Skin:  Wound (see comment) (chest and leg incisions )  Last BM:  11/20  Height:   Ht Readings from Last 1 Encounters:  11/03/16 5\' 2"  (1.575 m)    Weight:   Wt Readings from Last 1 Encounters:  11/04/16 181 lb 10.5 oz (82.4 kg)    Ideal Body Weight:  50 kg  BMI:  Body mass index is 33.23 kg/m.  Estimated Nutritional Needs:   Used previous weight of 158lb(71.8kg) from 11/20 to estimate needs since more consistent with previous weight history.    Kcal:  1593kcal/day    Protein:  110-115g/day   Fluid:  1.5L/day   EDUCATION NEEDS:   Education needs no appropriate at this time  Betsey Holidayasey Jahiem Franzoni, RD, LDN

## 2016-11-04 NOTE — Anesthesia Postprocedure Evaluation (Signed)
Anesthesia Post Note  Patient: Christina Morrison  Procedure(s) Performed: Procedure(s) (LRB): CORONARY ARTERY BYPASS GRAFTING (CABG) x  three, using left internal mammary artery and right leg greater saphenous vein harvested endoscopically (N/A) TRANSESOPHAGEAL ECHOCARDIOGRAM (TEE) (N/A)  Patient location during evaluation: SICU Anesthesia Type: General Level of consciousness: patient remains intubated per anesthesia plan Vital Signs Assessment: post-procedure vital signs reviewed and stable Respiratory status: patient on ventilator - see flowsheet for VS and patient remains intubated per anesthesia plan    Last Vitals:  Vitals:   11/04/16 1845 11/04/16 1900  BP:    Pulse:    Resp: 16 14  Temp: 37.1 C 37.1 C    Last Pain:  Vitals:   11/04/16 1745  TempSrc: Core (Comment)  PainSc:                  Christina Morrison

## 2016-11-04 NOTE — Care Management Note (Signed)
Case Management Note  Patient Details  Name: Christina Morrison MRN: [id number] Date of Birth: 05/12/1976  Subjective/Objective:     [name]  EMERGENCY REDO STERNOTOMY IN ICU, REDO CABG X 1, PLACEMENT OF BILATERAL VAD, PLACEMENT OF LEFT FEMORAL ARTERIAL LINE (N/A)          Action/Plan:  Pt is intubated - no family at bedside.  CM will continue to follow   Expected Discharge Date:                  Expected Discharge Plan:     In-House Referral:     Discharge planning Services  CM Consult  Post Acute Care Choice:    Choice offered to:     DME Arranged:    DME Agency:     HH Arranged:    HH Agency:     Status of Service:  In process, will continue to follow  If discussed at Long Length of Stay Meetings, dates discussed:    Additional Comments:  Christina Morrison, Christina Haughey S, RN 11/04/2016, 2:25 PM

## 2016-11-04 NOTE — Progress Notes (Signed)
11/04/2016 5:15 PM Dr. Cornelius Moraswen paged and made aware of lower CI as well as CVP and increased chatter in lines. Current lab results reviewed with MD. Verbal order received to transfuse second unit of PRBC. Orders placed and enacted. Will continue to closely monitor patient.  Shantara Goosby, Blanchard KelchStephanie Ingold

## 2016-11-04 NOTE — Progress Notes (Signed)
Dr. Dorris FetchHendrickson notified of H&H results of Hbg 10.7 and Hct 29.7 after 2 units of blood. CVP 7, flows 4 on RVAD and 5 on LVAD. PA pressure 25/16. Verbal order to give 1 unit of PRBC and hold scheduled 20 mg of Lasix this evening. Will implement orders and continue to monitor patient.

## 2016-11-04 NOTE — Progress Notes (Signed)
[address]       [place] [id number]             [phone]        CARDIOTHORACIC SURGERY PROGRESS NOTE   R1 Day Post-Op Procedure(s) (LRB): EMERGENCY REDO STERNOTOMY IN ICU, REDO CABG X 1, PLACEMENT OF BILATERAL VAD, PLACEMENT OF LEFT FEMORAL ARTERIAL LINE (N/A)  Subjective: Woke up overnight.  Following simple commands w/ all 4 extremities.  Currently sedated but still relatively light, moving spontaneously.  Objective: Vital signs: BP Readings from Last 1 Encounters:  11/03/16 (!) 86/69   Pulse Readings from Last 1 Encounters:  11/04/16 (!) 101   Resp Readings from Last 1 Encounters:  11/04/16 19   Temp Readings from Last 1 Encounters:  11/04/16 98.4 F (36.9 C)    Hemodynamics: PAP: (17-44)/(10-27) 30/20 CVP:  [5 mmHg-16 mmHg] 16 mmHg CO:  [3.3 L/min-4.9 L/min] 4.5 L/min CI:  [1.9 L/min/m2-2.8 L/min/m2] 2.6 L/min/m2  Mixed venous co-ox 61%%  Physical Exam:  Rhythm:   sinus  Breath sounds: Coarse but clear  Heart sounds:  RRR  Incisions:  Esmark dressing flat, minimal oozing around cannulas  Abdomen:  Soft, non-distended  Extremities:  Warm, well-perfused, swollen, left groin hematoma stable  Chest tubes:  Low volume serosanguinous output - becoming less bloody, no air leak    Intake/Output from previous day: 11/20 0701 - 11/21 0700 In: 13704.3 [I.V.:8178.3; Blood:3626; IV Piggyback:1900] Out: [id number] [Urine:7700; Emesis/NG output:200; Drains:450; Blood:2900; Chest Tube:710] Intake/Output this shift: No intake/output data recorded.  Lab Results:  CBC: Recent Labs  11/03/16 2209 11/04/16 0300  WBC 20.5* 10.4  HGB 11.7* 6.4*  HCT 33.4* 18.0*  PLT 151 114*    BMET:  Recent Labs  11/03/16 0406  11/03/16 1953 11/03/16 2131 11/04/16 0300  NA 140  < > 146* 146* 144  K 3.4*  < > 2.9* 3.4* 3.7  CL 107  < > 100*  --  106  CO2 25  --   --   --  28  GLUCOSE 106*  < > 147* 114* 149*  BUN 5*  < > 6  --  10  CREATININE 0.79   < > 0.80  --  1.02*  CALCIUM 8.8*  --   --   --  6.6*  < > = values in this interval not displayed.   PT/INR:   Recent Labs  11/04/16 0300  LABPROT 19.4*  INR 1.62    CBG (last 3)   Recent Labs  11/04/16 0547 11/04/16 0631 11/04/16 0736  GLUCAP 120* 120* 108*    ABG    Component Value Date/Time   PHART 7.469 (H) 11/04/2016 0309   PCO2ART 41.7 11/04/2016 0309   PO2ART 251.0 (H) 11/04/2016 0309   HCO3 30.5 (H) 11/04/2016 0309   TCO2 32 11/04/2016 0309   ACIDBASEDEF 3.0 (H) 11/03/2016 1950   O2SAT 61.0 11/04/2016 0315    CXR: Looks remarkably good w/ very mild pulm vasc congestion and tubes/cannulas in stable position - hasn't been read by a radiologist   Assessment/Plan: S/P Procedure(s) (LRB): EMERGENCY REDO STERNOTOMY IN ICU, REDO CABG X 1, PLACEMENT OF BILATERAL VAD, PLACEMENT OF LEFT FEMORAL ARTERIAL LINE (N/A)  Overall remarkably stable overnight on BiVAD support Maintaining NSR w/ stable MAP and BiVAD flows on very low dose Neo, milrinone, and nitric oxide Oxygenation remarkably good w/ O2 sats 100% on 50% FiO2, CXR looks good Acute systolic CHF w/ massive volume overload, making  good UOP, creatinine up slightly 1.0 Appears to be neurologically intact - following simple commands - now on continuous IV sedation Expected post op acute blood loss anemia, Hgb down to 6.4 this morning - no other signs of ongoing significant blood loss Post op thrombocytopenia, platelet count down to 114k this morning   Transfuse 2 units PRBC's and recheck Hgb/Hct  Start heparin if Hgb rises appropriately and no other signs suggestive of ongoing blood loss  Lasix to stimulate diuresis  Continuous IV sedation as needed    Purcell Nailslarence H Lashon Hillier, MD 11/04/2016 7:59 AM

## 2016-11-05 ENCOUNTER — Inpatient Hospital Stay (HOSPITAL_COMMUNITY): Payer: Medicaid Other

## 2016-11-05 LAB — POCT I-STAT 3, ART BLOOD GAS (G3+)
ACID-BASE EXCESS: 2 mmol/L (ref 0.0–2.0)
BICARBONATE: 28.3 mmol/L — AB (ref 20.0–28.0)
O2 Saturation: 99 %
PCO2 ART: 49.2 mmHg — AB (ref 32.0–48.0)
PH ART: 7.368 (ref 7.350–7.450)
PO2 ART: 150 mmHg — AB (ref 83.0–108.0)
Patient temperature: 36.9
TCO2: 30 mmol/L (ref 0–100)

## 2016-11-05 LAB — CBC
HCT: 30.4 % — ABNORMAL LOW (ref 36.0–46.0)
HCT: 19.7 % — ABNORMAL LOW (ref 36.0–46.0)
Hemoglobin: 10.6 g/dL — ABNORMAL LOW (ref 12.0–15.0)
Hemoglobin: 7 g/dL — ABNORMAL LOW (ref 12.0–15.0)
MCH: 29.4 pg (ref 26.0–34.0)
MCH: 29.9 pg (ref 26.0–34.0)
MCHC: 34.9 g/dL (ref 30.0–36.0)
MCHC: 35.5 g/dL (ref 30.0–36.0)
MCV: 84.2 fL (ref 78.0–100.0)
MCV: 84.2 fL (ref 78.0–100.0)
PLATELETS: 81 10*3/uL — AB (ref 150–400)
Platelets: 63 10*3/uL — ABNORMAL LOW (ref 150–400)
RBC: 3.61 MIL/uL — ABNORMAL LOW (ref 3.87–5.11)
RBC: 2.34 MIL/uL — ABNORMAL LOW (ref 3.87–5.11)
RDW: 15 % (ref 11.5–15.5)
RDW: 15.7 % — AB (ref 11.5–15.5)
WBC: 13.4 10*3/uL — ABNORMAL HIGH (ref 4.0–10.5)
WBC: 8.4 10*3/uL (ref 4.0–10.5)

## 2016-11-05 LAB — POCT I-STAT, CHEM 8
BUN: 12 mg/dL (ref 6–20)
CALCIUM ION: 1.07 mmol/L — AB (ref 1.15–1.40)
Chloride: 101 mmol/L (ref 101–111)
Creatinine, Ser: 0.7 mg/dL (ref 0.44–1.00)
Glucose, Bld: 100 mg/dL — ABNORMAL HIGH (ref 65–99)
HEMATOCRIT: 20 % — AB (ref 36.0–46.0)
Hemoglobin: 6.8 g/dL — CL (ref 12.0–15.0)
Potassium: 3.6 mmol/L (ref 3.5–5.1)
SODIUM: 139 mmol/L (ref 135–145)
TCO2: 26 mmol/L (ref 0–100)

## 2016-11-05 LAB — GLUCOSE, CAPILLARY
GLUCOSE-CAPILLARY: 101 mg/dL — AB (ref 65–99)
GLUCOSE-CAPILLARY: 102 mg/dL — AB (ref 65–99)
GLUCOSE-CAPILLARY: 105 mg/dL — AB (ref 65–99)
GLUCOSE-CAPILLARY: 108 mg/dL — AB (ref 65–99)
GLUCOSE-CAPILLARY: 110 mg/dL — AB (ref 65–99)
GLUCOSE-CAPILLARY: 112 mg/dL — AB (ref 65–99)
GLUCOSE-CAPILLARY: 114 mg/dL — AB (ref 65–99)
GLUCOSE-CAPILLARY: 128 mg/dL — AB (ref 65–99)
GLUCOSE-CAPILLARY: 134 mg/dL — AB (ref 65–99)
GLUCOSE-CAPILLARY: 137 mg/dL — AB (ref 65–99)
GLUCOSE-CAPILLARY: 142 mg/dL — AB (ref 65–99)
GLUCOSE-CAPILLARY: 99 mg/dL (ref 65–99)
Glucose-Capillary: 103 mg/dL — ABNORMAL HIGH (ref 65–99)
Glucose-Capillary: 105 mg/dL — ABNORMAL HIGH (ref 65–99)
Glucose-Capillary: 105 mg/dL — ABNORMAL HIGH (ref 65–99)
Glucose-Capillary: 110 mg/dL — ABNORMAL HIGH (ref 65–99)
Glucose-Capillary: 112 mg/dL — ABNORMAL HIGH (ref 65–99)
Glucose-Capillary: 113 mg/dL — ABNORMAL HIGH (ref 65–99)
Glucose-Capillary: 117 mg/dL — ABNORMAL HIGH (ref 65–99)
Glucose-Capillary: 120 mg/dL — ABNORMAL HIGH (ref 65–99)
Glucose-Capillary: 121 mg/dL — ABNORMAL HIGH (ref 65–99)
Glucose-Capillary: 140 mg/dL — ABNORMAL HIGH (ref 65–99)
Glucose-Capillary: 96 mg/dL (ref 65–99)

## 2016-11-05 LAB — APTT
APTT: 55 s — AB (ref 24–36)
aPTT: 135 seconds — ABNORMAL HIGH (ref 24–36)

## 2016-11-05 LAB — COMPREHENSIVE METABOLIC PANEL
ALT: 70 U/L — ABNORMAL HIGH (ref 14–54)
AST: 128 U/L — ABNORMAL HIGH (ref 15–41)
Albumin: 2.6 g/dL — ABNORMAL LOW (ref 3.5–5.0)
Alkaline Phosphatase: 39 U/L (ref 38–126)
Anion gap: 4 — ABNORMAL LOW (ref 5–15)
BILIRUBIN TOTAL: 1 mg/dL (ref 0.3–1.2)
BUN: 14 mg/dL (ref 6–20)
CHLORIDE: 107 mmol/L (ref 101–111)
CO2: 28 mmol/L (ref 22–32)
CREATININE: 0.82 mg/dL (ref 0.44–1.00)
Calcium: 6.6 mg/dL — ABNORMAL LOW (ref 8.9–10.3)
Glucose, Bld: 103 mg/dL — ABNORMAL HIGH (ref 65–99)
POTASSIUM: 3.9 mmol/L (ref 3.5–5.1)
Sodium: 139 mmol/L (ref 135–145)
TOTAL PROTEIN: 4.1 g/dL — AB (ref 6.5–8.1)

## 2016-11-05 LAB — PREPARE RBC (CROSSMATCH)

## 2016-11-05 LAB — POCT ACTIVATED CLOTTING TIME
ACTIVATED CLOTTING TIME: 169 s
ACTIVATED CLOTTING TIME: 169 s
ACTIVATED CLOTTING TIME: 169 s
ACTIVATED CLOTTING TIME: 175 s
ACTIVATED CLOTTING TIME: 175 s
ACTIVATED CLOTTING TIME: 175 s
ACTIVATED CLOTTING TIME: 175 s
ACTIVATED CLOTTING TIME: 175 s
ACTIVATED CLOTTING TIME: 175 s
ACTIVATED CLOTTING TIME: 175 s
ACTIVATED CLOTTING TIME: 175 s
Activated Clotting Time: 175 seconds
Activated Clotting Time: 180 seconds
Activated Clotting Time: 169 seconds
Activated Clotting Time: 169 seconds
Activated Clotting Time: 175 s
Activated Clotting Time: 175 s
Activated Clotting Time: 175 s
Activated Clotting Time: 169 seconds

## 2016-11-05 LAB — COOXEMETRY PANEL
Carboxyhemoglobin: 1.9 % — ABNORMAL HIGH (ref 0.5–1.5)
Methemoglobin: 1.6 % — ABNORMAL HIGH (ref 0.0–1.5)
O2 Saturation: 72.6 %
Total hemoglobin: 10.4 g/dL — ABNORMAL LOW (ref 12.0–16.0)

## 2016-11-05 LAB — PROTIME-INR
INR: 1.41
Prothrombin Time: 17.4 seconds — ABNORMAL HIGH (ref 11.4–15.2)

## 2016-11-05 LAB — HEPARIN LEVEL (UNFRACTIONATED): HEPARIN UNFRACTIONATED: 0.29 [IU]/mL — AB (ref 0.30–0.70)

## 2016-11-05 LAB — CALCIUM, IONIZED: Calcium, Ionized, Serum: 3.9 mg/dL — ABNORMAL LOW (ref 4.5–5.6)

## 2016-11-05 LAB — HEMOGLOBIN AND HEMATOCRIT, BLOOD
HCT: 26.4 % — ABNORMAL LOW (ref 36.0–46.0)
Hemoglobin: 9.3 g/dL — ABNORMAL LOW (ref 12.0–15.0)

## 2016-11-05 MED ORDER — SODIUM CHLORIDE 0.9 % IV SOLN
Freq: Once | INTRAVENOUS | Status: AC
  Administered 2016-11-05: 17:00:00 via INTRAVENOUS

## 2016-11-05 MED ORDER — SODIUM CHLORIDE 0.9 % IV SOLN
0.0500 mg/kg/h | INTRAVENOUS | Status: DC
  Administered 2016-11-05: 0.05 mg/kg/h via INTRAVENOUS
  Filled 2016-11-05: qty 250

## 2016-11-05 MED ORDER — SODIUM CHLORIDE 0.9 % IV SOLN: Freq: Once | INTRAVENOUS | Status: DC

## 2016-11-05 MED ORDER — ALBUMIN HUMAN 5 % IV SOLN
250.0000 mL | INTRAVENOUS | Status: AC | PRN
  Administered 2016-11-05 (×4): 250 mL via INTRAVENOUS
  Filled 2016-11-05 (×3): qty 250

## 2016-11-05 MED ORDER — DEXMEDETOMIDINE HCL IN NACL 400 MCG/100ML IV SOLN
0.0000 ug/kg/h | INTRAVENOUS | Status: DC
  Administered 2016-11-05 – 2016-11-07 (×9): 1.2 ug/kg/h via INTRAVENOUS
  Filled 2016-11-05 (×9): qty 100

## 2016-11-05 MED ORDER — ALBUMIN HUMAN 5 % IV SOLN
250.0000 mL | INTRAVENOUS | Status: AC | PRN
  Administered 2016-11-06 (×4): 250 mL via INTRAVENOUS
  Filled 2016-11-05 (×3): qty 250

## 2016-11-05 MED ORDER — PHENYLEPHRINE HCL 10 MG/ML IJ SOLN
0.0000 ug/min | INTRAVENOUS | Status: DC
  Administered 2016-11-05: 60 ug/min via INTRAVENOUS
  Administered 2016-11-06: 15 ug/min via INTRAVENOUS
  Filled 2016-11-05 (×3): qty 4

## 2016-11-05 MED ORDER — FAMOTIDINE IN NACL 20-0.9 MG/50ML-% IV SOLN
20.0000 mg | Freq: Two times a day (BID) | INTRAVENOUS | Status: DC
  Administered 2016-11-05 – 2016-11-06 (×4): 20 mg via INTRAVENOUS
  Filled 2016-11-05 (×4): qty 50

## 2016-11-05 MED ORDER — MILRINONE LACTATE IN DEXTROSE 20-5 MG/100ML-% IV SOLN
0.3000 ug/kg/min | INTRAVENOUS | Status: DC
  Administered 2016-11-06 – 2016-11-07 (×3): 0.3 ug/kg/min via INTRAVENOUS
  Filled 2016-11-05 (×3): qty 100

## 2016-11-05 MED ORDER — SODIUM CHLORIDE 0.9 % IV SOLN
30.0000 meq | Freq: Once | INTRAVENOUS | Status: AC
  Administered 2016-11-05: 30 meq via INTRAVENOUS
  Filled 2016-11-05: qty 15

## 2016-11-05 NOTE — Progress Notes (Addendum)
[address]       [place] [id number]             [phone]        CARDIOTHORACIC SURGERY PROGRESS NOTE   R2 Days Post-Op Procedure(s) (LRB): EMERGENCY REDO STERNOTOMY IN ICU, REDO CABG X 1, PLACEMENT OF BILATERAL VAD, PLACEMENT OF LEFT FEMORAL ARTERIAL LINE (N/A)  Subjective: Sedated on vent.  Objective: Vital signs: BP Readings from Last 1 Encounters:  11/05/16 (!) 78/64   Pulse Readings from Last 1 Encounters:  11/05/16 93   Resp Readings from Last 1 Encounters:  11/05/16 14   Temp Readings from Last 1 Encounters:  11/05/16 98.6 F (37 C)    Hemodynamics: PAP: (20-50)/(13-28) 25/14 CVP:  [5 mmHg-20 mmHg] 6 mmHg CO:  [2.7 L/min-4 L/min] 2.9 L/min CI:  [1.6 L/min/m2-2.3 L/min/m2] 1.7 L/min/m2  Mixed venous co-ox 72.6% LVAD flows consistently > 4.5-5.0 L/min RVAD flows consistently > 3.5-4.0 L/min   Physical Exam:  Rhythm:   sinus  Breath sounds: clear  Heart sounds:  RRR  Incisions:  Wound VAC flat, mild oozing around cannulas  Abdomen:  Soft, non-distended, quiet  Extremities:  Warm, well-perfused, left groin hematoma stable  Chest tubes:  Decreasing but significant volume thin serosanguinous output, no air leak    Intake/Output from previous day: 11/21 0701 - 11/22 0700 In: 6672.9 [I.V.:4017.9; Blood:1215; NG/GT:90; IV Piggyback:1350] Out: 5845 [Urine:4555; Emesis/NG output:250; Drains:230; Chest Tube:810] Intake/Output this shift: No intake/output data recorded.  Lab Results:  CBC: Recent Labs  11/04/16 1700 11/04/16 2055 11/05/16 0215  WBC 12.6*  --  13.4*  HGB 9.8* 10.4* 10.6*  HCT 27.7* 29.7* 30.4*  PLT 103*  --  81*    BMET:  Recent Labs  11/04/16 0300 11/04/16 1613 11/04/16 1700 11/05/16 0215  NA 144 144  --  139  K 3.7 3.4*  --  3.9  CL 106 100*  --  107  CO2 28  --   --  28  GLUCOSE 149* 108*  --  103*  BUN 10 16  --  14  CREATININE 1.02* 0.90 1.00 0.82  CALCIUM 6.6*  --   --  6.6*      PT/INR:   Recent Labs  11/05/16 0215  LABPROT 17.4*  INR 1.41    CBG (last 3)   Recent Labs  11/05/16 0456 11/05/16 0558 11/05/16 0651  GLUCAP 128* 120* 112*    ABG    Component Value Date/Time   PHART 7.368 11/05/2016 0216   PCO2ART 49.2 (H) 11/05/2016 0216   PO2ART 150.0 (H) 11/05/2016 0216   HCO3 28.3 (H) 11/05/2016 0216   TCO2 30 11/05/2016 0216   ACIDBASEDEF 3.0 (H) 11/03/2016 1950   O2SAT 72.6 11/05/2016 0221    CXR: PORTABLE CHEST 1 VIEW  COMPARISON:  11/04/2016  FINDINGS: Cardiac shadow is stable. Postoperative changes are noted. Bilateral thoracostomy catheters are again identified. Mediastinal drain and apparent pericardial drains are noted and stable. Multiple radiopaque tubes are identified overlying the chest consistent with the history of biventricular assist device. No pneumothorax is noted. No sizable effusion is seen. Swan-Ganz catheter is noted within the pulmonary outflow tract. Endotracheal tube and nasogastric catheter are noted in satisfactory position. Left subclavian central line is identified extending into the left internal jugular vein. No bony abnormality is noted.  IMPRESSION: Tubes and lines as described above.  Continued improved aeration without focal infiltrate or sizable pneumothorax.   Electronically Signed  By: Alcide CleverMark  Lukens M.D.   On: 11/05/2016 08:00  Assessment/Plan: S/P Procedure(s) (LRB): EMERGENCY REDO STERNOTOMY IN ICU, REDO CABG X 1, PLACEMENT OF BILATERAL VAD, PLACEMENT OF LEFT FEMORAL ARTERIAL LINE (N/A)  Overall remarkably stable now POD2 on BiVAD support Maintaining NSR w/ stable MAP and BiVAD flows on very low dose Neo, milrinone, and nitric oxide Pulsatility in A-line increased considerably c/w improved LV function Oxygenation remarkably good w/ O2 sats 100% on 50% FiO2, CXR looks good Acute systolic CHF w/ massive volume overload, making good UOP, creatinine stable 0.8 Appears to be  neurologically intact - has followed simple commands - now on continuous IV sedation Expected post op acute blood loss anemia, Hgb stable 10.6 this morning - no other signs of ongoing significant blood loss except possibly from hematoma left groin and thigh Post op thrombocytopenia, platelet count down to 81k this morning on IV heparin per protocol   Continue milrinone but wean nitric oxide to 20 ppm for now  Increased pulsatility suggestive of improving LV function - possibly ready for attempt to wean from BiVAD support by the end of the week  Consider return to OR on Friday for TEE, washout of chest, possible attempt to wean BiVAD  Watch Hgb and platelet count closely, continue heparin for now  Watch left groin and thigh hematoma closely, apply gentle pressure, and continue heparin for now  Monitor for signs of bowel function and consider initiation of enteral feedings in 1-2 days  Patient's family updated regarding progress   I spent in excess of 30 minutes during the conduct of this hospital encounter and >50% of this time involved direct face-to-face encounter with the patient for counseling and/or coordination of their care.    Purcell Nailslarence H Marielouise Amey, MD 11/05/2016 8:06 AM

## 2016-11-05 NOTE — Progress Notes (Signed)
Per Dr. PVT, do not go any higher than 0.05mg /kg/hr on Angiomax no matter the APTT.  PRN orders received to transfuse 1 unit PRBC for hemoglobin less than 8 and 2 units PRBC for hemoglobin less than 7. Will continue to monitor closely. Modena JanskyKevin Nannette Zill RN [facility]

## 2016-11-05 NOTE — Progress Notes (Addendum)
ANTICOAGULATION CONSULT NOTE - Initial Consult  Pharmacy Consult for bivalirudin Indication:   Allergies  Allergen Reactions  . Ranexa [Ranolazine] Nausea Only    Dizziness (only the 1,000 mg dose has this effect)    Patient Measurements: Height: _0  (157.5 cm) Weight: 185 lb 6.5 oz (84.1 kg) IBW/kg (Calculated) : 50.1   Vital Signs: Temp: 97.7 F (36.5 C) (11/22 1630) Temp Source: Core (Comment) (11/22 1600) BP: 95/73 (11/22 1613) Pulse Rate: 85 (11/22 1630)  Labs:  Recent Labs  11/03/16 0406  11/03/16 2208  11/04/16 0300  11/04/16 1700 11/04/16 2055 11/05/16 0215 11/05/16 1605  HGB 10.6*  < >  --   < > 6.4*  < > 9.8* 10.4* 10.6* 7.0*  6.8*  HCT 30.9*  < >  --   < > 18.0*  < > 27.7* 29.7* 30.4* 19.7*  20.0*  PLT 226  < > 150  < > 114*  < > 103*  --  81* 63*  APTT  --   < > 36  --  41*  --   --   --  135*  --   LABPROT  --   < > 17.6*  --  19.4*  --   --   --  17.4*  --   INR  --   < > 1.44  --  1.62  --   --   --  1.41  --   HEPARINUNFRC 0.23*  --   --   --  <0.10*  --   --   --  0.29*  --   CREATININE 0.79  < >  --   --  1.02*  < > 1.00  --  0.82 0.70  < > = values in this interval not displayed.  Estimated Creatinine Clearance: 94 mL/min (by C-G formula based on SCr of 0.7 mg/dL).   Medical History: Past Medical History:  Diagnosis Date  . Anxiety   . Bipolar 1 disorder ([place])   . Cardiac arrest ([place]) 11/03/2016  . Cardiogenic shock ([place]) 11/03/2016  . Chronic pain   . Cocaine use   . Coronary artery disease    a. 03/2015 NSTEMI/PCI in setting of cocaine use - BMS to diagonal. b. NSTEMI 12/2015 s/o overlapping DES to Cx, residual mild RCA and LAD disease, 75% OM1.  . Depression   . Dyslipidemia   . Migraine   . Osteoarthritis   . Polysubstance abuse    a. tobacco/cocaine  . 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 Scoliosis   . TIA (transient ischemic attack)     Medications:  Scheduled:   . sodium chloride   Intravenous Once  . cefUROXime (ZINACEF)  IV  1.5 g Intravenous Q12H  . chlorhexidine gluconate (MEDLINE KIT)  15 mL Mouth Rinse BID  . famotidine (PEPCID) IV  20 mg Intravenous Q12H  . furosemide  20 mg Intravenous Q6H  . insulin regular  0-10 Units Intravenous TID WC  . mouth rinse  15 mL Mouth Rinse 10 times per day  . mupirocin ointment  1 application Topical BID  . potassium chloride (KCL MULTIRUN) 30 mEq in 265 mL IVPB  30 mEq Intravenous Once  . sodium chloride flush  10-40 mL Intracatheter Q12H  . sodium chloride flush  3 mL Intravenous Q12H  . vancomycin  1,000 mg Intravenous Q12H   Infusions:  . sodium chloride  Stopped (11/05/16 0800)  . sodium chloride    . sodium chloride 20 mL/hr at 11/05/16 1600  . dexmedetomidine 1.2 mcg/kg/hr (11/05/16 1600)  . fentaNYL infusion INTRAVENOUS 325 mcg/hr (11/05/16 1600)  . heparin 1,100 Units/hr (11/05/16 1600)  . insulin (NOVOLIN-R) infusion 0.8 Units/hr (11/05/16 1600)  . lactated ringers 20 mL/hr at 11/05/16 1600  . lactated ringers 20 mL/hr at 11/05/16 1600  . midazolam (VERSED) infusion 4 mg/hr (11/05/16 1600)  . milrinone 0.375 mcg/kg/min (11/05/16 1600)  . norepinephrine (LEVOPHED) Adult infusion    . phenylephrine (NEO-SYNEPHRINE) Adult infusion 35 mcg/min (11/05/16 1600)  . vasopressin (PITRESSIN) infusion - *FOR SHOCK*      Assessment: 40 yo female s/p CABG on 11/20 then with VF arrest then re-do sternotomy re-do CABG x1 with placement of biVAD support. Pharmacy asked to dose a direct thrombin inhibitor for r/o HIT -heparin initially started 11/17 with platelet count= 249). 11/22: platelet count= 63 -presurgery platelet count= 167 (11/20) -Hg today 6.8, s/p 4 units PRBC   Goal of Therapy:  APTT goal 60-80 per Dr. Lawson Fiscal Monitor platelets by anticoagulation protocol: Yes   Plan:  -Begin bivalirudin at 0.4m/kg/hr -Will shoot towards the low end of goal -aPTT in 2 hours and daily -CBC  daily -HIT antibody pending  AHildred Laser Pharm D 11/05/2016 5:15 PM  Addendum -aPTT= 55 on 0.022mkg/min (infusion startes ~ 6:30pm) -Per Dr. VaLucianne Leiright: do not increase any further and recheck at 5aJeffersonNo bivalirudin increase per MD -aPTT 5am and 5pm per MD  AnHildred LaserPharm D 11/05/2016 8:58 PM

## 2016-11-05 NOTE — Progress Notes (Signed)
Advanced Heart Failure Rounding Note   Subjective:    40 y/o woman with HTN, CAD and tobacco use. Underwent stenting of LCX in 1/17. Readmitted for NSTEMI. Found to have 2v CAD with high-grade ostial LAD and LCX stensosis. Underwent CABG with Dr. Roxy Manns with LIMA to LAD, SVG to DIAG and SVG to LCX. Developed VF arrest and had re-do sternotomy begun at bedside with cardia massage. 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 Underwent re-do CABG x1 with SVG to OM and placement of biVAD support due to shock.   Remains on bivad support - having chatter on arterial line. Intubated on 40% FiO2 but responds to commands when awake. Good urine output. CVP down to 7 so getting albumin back  On milrinone 0.25 and phenylephrine 30  MAP ~70. Weight up 30 pounds total.   LVAD speed 3400 Flow 5.3 L/min RVAD speed 2700 4.3 L/min  Swan # RA 7 PA 22/15 (18)     Objective:   Weight Range:  Vital Signs:   Temp:  [95 F (35 C)-99.3 F (37.4 C)] 97.7 F (36.5 C) (11/22 1600) Pulse Rate:  [47-94] 84 (11/22 1600) Resp:  [6-22] 16 (11/22 1600) BP: (78-98)/(64-82) 89/80 (11/22 1119) SpO2:  [100 %] 100 % (11/22 1600) Arterial Line BP: (70-99)/(50-86) 95/50 (11/22 1600) FiO2 (%):  [40 %-50 %] (P) 40 % (11/22 1545) Weight:  [84.1 kg (185 lb 6.5 oz)] 84.1 kg (185 lb 6.5 oz) (11/22 0500) Last BM Date:  (PTA)  Weight change: Filed Weights   11/03/16 2145 11/04/16 0500 11/05/16 0500  Weight: 71.8 kg (158 lb 4.6 oz) 82.4 kg (181 lb 10.5 oz) 84.1 kg (185 lb 6.5 oz)    Intake/Output:   Intake/Output Summary (Last 24 hours) at 11/05/16 1609 Last data filed at 11/05/16 1500  Gross per 24 hour  Intake          6327.31 ml  Output             5155 ml  Net          1172.31 ml     Physical Exam: General:  Intubated. Sedated  HEENT: normal ETT Neck: supple.RIJ swan Cor: Chest open with wound vac. Bilateral VADs Lungs: coarse Abdomen: soft, nontender, +distended. quiet Extremities: warm 2+ edema Neuro:  intubated sedated   Telemetry: SR 80s (viewed personally)  Labs: Basic Metabolic Panel:  Recent Labs Lab 11/01/16 0636 11/02/16 0037 11/03/16 0406  11/03/16 1739 11/03/16 1953 11/03/16 2131 11/04/16 0300 11/04/16 1613 11/04/16 1700 11/05/16 0215  NA 141 141 140  < > 140 146* 146* 144 144  --  139  K 3.8 3.7 3.4*  < > 5.8* 2.9* 3.4* 3.7 3.4*  --  3.9  CL 108 109 107  < > 100* 100*  --  106 100*  --  107  CO2 _0 --   --   --   --  28  --   --  28  GLUCOSE 103* 118* 106*  < > 322* 147* 114* 149* 108*  --  103*  BUN 5* 6 5*  < > 6 6  --  10 16  --  14  CREATININE 0.67 0.73 0.79  < > 0.70 0.80  --  1.02* 0.90 1.00 0.82  CALCIUM 8.7* 9.1 8.8*  --   --   --   --  6.6*  --   --  6.6*  MG  --   --   --   --   --   --   --  2.9*  --  2.2  --   < > = values in this interval not displayed.  Liver Function Tests:  Recent Labs Lab 10/30/16 2000 11/02/16 0037 11/05/16 0215  AST 19 18 128*  ALT 15 16 70*  ALKPHOS 106 103 39  BILITOT 0.7 0.5 1.0  PROT 6.8 6.7 4.1*  ALBUMIN 4.1 3.7 2.6*   No results for input(s): LIPASE, AMYLASE in the last 168 hours. No results for input(s): AMMONIA in the last 168 hours.  CBC:  Recent Labs Lab 10/30/16 2000  11/03/16 2209 11/04/16 0300 11/04/16 0630 11/04/16 1613 11/04/16 1700 11/04/16 2055 11/05/16 0215  WBC 5.8  < > 20.5* 10.4 10.1  --  12.6*  --  13.4*  NEUTROABS 3.9  --   --   --   --   --   --   --   --   HGB 13.3  < > 11.7* 6.4* 9.0* 8.5* 9.8* 10.4* 10.6*  HCT 38.0  < > 33.4* 18.0* 25.6* 25.0* 27.7* 29.7* 30.4*  MCV 88.2  < > 87.4 86.1 86.2  --  85.8  --  84.2  PLT 249  < > 151 114* 116*  --  103*  --  81*  < > = values in this interval not displayed.  Cardiac Enzymes:  Recent Labs Lab 10/31/16 0023 10/31/16 0225 10/31/16 1036 11/01/16 1150 11/02/16 0827  TROPONINI 0.09* 0.09* 0.06* 0.04* <0.03    BNP: BNP (last 3 results) No results for input(s): BNP in the last 8760 hours.  ProBNP (last 3 results) No  results for input(s): PROBNP in the last 8760 hours.    Other results:  Imaging: Dg Chest Port 1 View  Result Date: 11/05/2016 CLINICAL DATA:  Status post coronary bypass grafting EXAM: PORTABLE CHEST 1 VIEW COMPARISON:  11/04/2016 FINDINGS: Cardiac shadow is stable. Postoperative changes are noted. Bilateral thoracostomy catheters are again identified. Mediastinal drain and apparent pericardial drains are noted and stable. Multiple radiopaque tubes are identified overlying the chest consistent with the history of biventricular assist device. No pneumothorax is noted. No sizable effusion is seen. Swan-Ganz catheter is noted within the pulmonary outflow tract. Endotracheal tube and nasogastric catheter are noted in satisfactory position. Left subclavian central line is identified extending into the left internal jugular vein. No bony abnormality is noted. IMPRESSION: Tubes and lines as described above. Continued improved aeration without focal infiltrate or sizable pneumothorax. Electronically Signed   By: Inez Catalina M.D.   On: 11/05/2016 08:00   Dg Chest Port 1 View  Result Date: 11/04/2016 CLINICAL DATA:  History of cardiac arrest and status post CABG. Placement of biventricular assist device. EXAM: PORTABLE CHEST 1 VIEW COMPARISON:  11/03/2016 FINDINGS: Endotracheal tube is appropriately positioned, 4.1 cm above the carina. Swan-Ganz catheter in the region of the distal main pulmonary artery. Again noted are bilateral chest tubes. There is tubing over the heart compatible with a biventricular assist device. No evidence for a large pneumothorax. Heart size is stable and within normal limits for size. Again noted is the left subclavian central line that is extending cephalad and the tip is beyond the image. Slightly improved aeration in lungs suggest decreased edema. Nasogastric tube extends into the abdomen. IMPRESSION: Slightly improved aeration of the lungs may represent decreased pulmonary  edema. Minimal change in the support apparatuses as described. Again, the left subclavian central line extends cephalad and the tip is beyond the image. Negative for pneumothorax. Electronically Signed   By: Markus Daft  M.D.   On: 11/04/2016 08:00   Dg Chest Port 1 View  Result Date: 11/03/2016 CLINICAL DATA:  Postop emergency surgery EXAM: PORTABLE CHEST 1 VIEW COMPARISON:  11/03/2016 FINDINGS: Endotracheal tube tip is approximately 4.4 cm superior to the carina. Right IJ Swan-Ganz catheter tip projects over the left main pulmonary artery region. Interim removal of esophageal tube. Repositioning or replacement of bilateral chest drainage catheters. A mediastinal drain is again evident. Left-sided catheter tubing courses over the left neck cephalad and is similar compared to prior. Multiple additional coital like opacities overlie the thorax. There are low lung volumes. Right greater than left hazy bilateral edema or infiltrates. No effusion. Removal of sternotomy wires. Stable heart size. Mild lucency at the left cardiac apex and right CP angle could be related to postoperative air. IMPRESSION: 1. Support lines and tubes as described above. There is a catheter that overlies the left neck and the tip is presumably directed cephalad, recommend clinical correlation. 2. Low lung volumes with hazy right greater than left edema or infiltrates. 3. Mild lucencies at the right cardiophrenic angle and left cardiac apex could be related to postop status. Electronically Signed   By: Donavan Foil M.D.   On: 11/03/2016 21:17     Medications:     Scheduled Medications: . aspirin EC  325 mg Oral Daily   Or  . aspirin  324 mg Per Tube Daily  . cefUROXime (ZINACEF)  IV  1.5 g Intravenous Q12H  . chlorhexidine gluconate (MEDLINE KIT)  15 mL Mouth Rinse BID  . famotidine (PEPCID) IV  20 mg Intravenous Q12H  . furosemide  20 mg Intravenous Q6H  . insulin regular  0-10 Units Intravenous TID WC  . mouth rinse  15 mL  Mouth Rinse 10 times per day  . mupirocin ointment  1 application Topical BID  . sodium chloride flush  10-40 mL Intracatheter Q12H  . sodium chloride flush  3 mL Intravenous Q12H  . vancomycin  1,000 mg Intravenous Q12H    Infusions: . sodium chloride 20 mL/hr at 11/04/16 1000  . sodium chloride    . sodium chloride 20 mL/hr at 11/05/16 1500  . dexmedetomidine 1.2 mcg/kg/hr (11/05/16 1500)  . fentaNYL infusion INTRAVENOUS 325 mcg/hr (11/05/16 1500)  . heparin 1,100 Units/hr (11/05/16 1600)  . insulin (NOVOLIN-R) infusion 1.1 Units/hr (11/05/16 1500)  . lactated ringers 20 mL/hr at 11/05/16 1500  . lactated ringers 20 mL/hr at 11/05/16 1500  . midazolam (VERSED) infusion 4 mg/hr (11/05/16 1500)  . milrinone 0.375 mcg/kg/min (11/05/16 1500)  . norepinephrine (LEVOPHED) Adult infusion    . phenylephrine (NEO-SYNEPHRINE) Adult infusion 30 mcg/min (11/05/16 1500)  . vasopressin (PITRESSIN) infusion - *FOR SHOCK*      PRN Medications: sodium chloride, albumin human, heparin, lactated ringers, metoprolol, midazolam, morphine injection, ondansetron (ZOFRAN) IV, sodium chloride flush, sodium chloride flush, vasopressin (PITRESSIN) infusion - *FOR SHOCK*   Assessment:   1. Cardiogenic shock with acute biventricular failure 2. VF arrest 3. CAD s/p CABG with emergent re-do 4. NSTEMI 5. Acute respiratory failure 6. Acute blood loss anemia 7. Hypokalemia  Plan/Discussion:    She is critically ill requiring full biVAD support. Currently flows look good and cardiac output is acceptable. Given good outputs and chatter on arterial line I turned down to 3200. Keep RVAD at 2800 for now.    Continue pressor support. Strive to keep CVP around 10 but she is 3rd spacing so may need to go a bit lower to permit gentle  diuresis. Fortunately oxygenation is good. Can wean NO with low PA pressures.   I have d/w Dr. Roxy Manns and plan on keeping VADs in place until at least Friday to allow time for  ventricular recovery. Will attempt to wean flows slowly. If tolerating may be able to explant Friday, if not maybe Monday. Can consider TEE or limited apical echo at some point to assess ventricular function. Currently maintaining NSR. Replace blood products as needed.   We will be available 24/7 to support the surgical team as needed.   The patient is critically ill with multiple organ systems failure and requires high complexity decision making for assessment and support, frequent evaluation and titration of therapies, application of advanced monitoring technologies and extensive interpretation of multiple databases.   Critical Care Time devoted to patient care services described in this note is 35 Minutes.   Length of Stay: 5   Glori Bickers MD 11/05/2016, 4:09 PM  Advanced Heart Failure Team Pager [phone] (M-F; 7a - 4p)  Please contact [facility] for night-coverage after hours (4p -7a ) and weekends on [email or web address]

## 2016-11-05 NOTE — Progress Notes (Signed)
CT surgery p.m. Rounds  Patient with constant blood loss from the left upper portion of her chest dressing and left groin Patient received 4 units of packed cells today Thrombocytopenia platelet count dropped 85,000-62,000 HRT panel pending Anticoagulation change from heparin to bivalirudin Goal PTT 60-80 seconds 1 unit platelets ordered this  p.m.

## 2016-11-05 NOTE — Progress Notes (Signed)
Clarification per Dr Donata ClayVan Trigt, despite aPTT values, do not exceed 0.05 mg/kg/hr on Angiomax. Please draw aPTT levels Q12HR. Will allow a decrease in medication if warranted, but do not increase. Will continue to monitor.

## 2016-11-06 ENCOUNTER — Inpatient Hospital Stay (HOSPITAL_COMMUNITY): Payer: Medicaid Other

## 2016-11-06 DIAGNOSIS — Z95811 Presence of heart assist device: Secondary | ICD-10-CM

## 2016-11-06 LAB — TYPE AND SCREEN
ABO/RH(D): O POS
ABO/RH(D): O POS
ANTIBODY SCREEN: POSITIVE
Antibody Screen: POSITIVE
DAT, IgG: POSITIVE
UNIT DIVISION: 0
UNIT DIVISION: 0
UNIT DIVISION: 0
UNIT DIVISION: 0
UNIT DIVISION: 0
UNIT DIVISION: 0
UNIT DIVISION: 0
Unit division: 0
Unit division: 0
Unit division: 0
Unit division: 0
Unit division: 0
Unit division: 0
Unit division: 0
Unit division: 0
Unit division: 0
Unit division: 0
Unit division: 0

## 2016-11-06 LAB — APTT
aPTT: 44 seconds — ABNORMAL HIGH (ref 24–36)
aPTT: 73 seconds — ABNORMAL HIGH (ref 24–36)

## 2016-11-06 LAB — VANCOMYCIN, TROUGH: VANCOMYCIN TR: 15 ug/mL (ref 15–20)

## 2016-11-06 LAB — GLUCOSE, CAPILLARY
GLUCOSE-CAPILLARY: 102 mg/dL — AB (ref 65–99)
GLUCOSE-CAPILLARY: 103 mg/dL — AB (ref 65–99)
GLUCOSE-CAPILLARY: 105 mg/dL — AB (ref 65–99)
GLUCOSE-CAPILLARY: 106 mg/dL — AB (ref 65–99)
GLUCOSE-CAPILLARY: 110 mg/dL — AB (ref 65–99)
GLUCOSE-CAPILLARY: 117 mg/dL — AB (ref 65–99)
GLUCOSE-CAPILLARY: 79 mg/dL (ref 65–99)
GLUCOSE-CAPILLARY: 89 mg/dL (ref 65–99)
GLUCOSE-CAPILLARY: 94 mg/dL (ref 65–99)
Glucose-Capillary: 100 mg/dL — ABNORMAL HIGH (ref 65–99)
Glucose-Capillary: 101 mg/dL — ABNORMAL HIGH (ref 65–99)
Glucose-Capillary: 106 mg/dL — ABNORMAL HIGH (ref 65–99)
Glucose-Capillary: 107 mg/dL — ABNORMAL HIGH (ref 65–99)
Glucose-Capillary: 108 mg/dL — ABNORMAL HIGH (ref 65–99)
Glucose-Capillary: 114 mg/dL — ABNORMAL HIGH (ref 65–99)
Glucose-Capillary: 115 mg/dL — ABNORMAL HIGH (ref 65–99)
Glucose-Capillary: 120 mg/dL — ABNORMAL HIGH (ref 65–99)
Glucose-Capillary: 130 mg/dL — ABNORMAL HIGH (ref 65–99)
Glucose-Capillary: 138 mg/dL — ABNORMAL HIGH (ref 65–99)
Glucose-Capillary: 99 mg/dL (ref 65–99)

## 2016-11-06 LAB — COMPREHENSIVE METABOLIC PANEL
ALT: 42 U/L (ref 14–54)
AST: 47 U/L — AB (ref 15–41)
Albumin: 3.2 g/dL — ABNORMAL LOW (ref 3.5–5.0)
Alkaline Phosphatase: 38 U/L (ref 38–126)
Anion gap: 5 (ref 5–15)
BUN: 9 mg/dL (ref 6–20)
CHLORIDE: 107 mmol/L (ref 101–111)
CO2: 26 mmol/L (ref 22–32)
Calcium: 7.7 mg/dL — ABNORMAL LOW (ref 8.9–10.3)
Creatinine, Ser: 0.87 mg/dL (ref 0.44–1.00)
GFR calc Af Amer: >60 mL/min (ref >60)
Glucose, Bld: 109 mg/dL — ABNORMAL HIGH (ref 65–99)
POTASSIUM: 3.6 mmol/L (ref 3.5–5.1)
Sodium: 138 mmol/L (ref 135–145)
Total Bilirubin: 1.5 mg/dL — ABNORMAL HIGH (ref 0.3–1.2)
Total Protein: 4.7 g/dL — ABNORMAL LOW (ref 6.5–8.1)

## 2016-11-06 LAB — POCT I-STAT 3, ART BLOOD GAS (G3+)
BICARBONATE: 25.7 mmol/L (ref 20.0–28.0)
O2 SAT: 99 %
PO2 ART: 133 mmHg — AB (ref 83.0–108.0)
TCO2: 27 mmol/L (ref 0–100)
pCO2 arterial: 44.8 mmHg (ref 32.0–48.0)
pH, Arterial: 7.368 (ref 7.350–7.450)

## 2016-11-06 LAB — COOXEMETRY PANEL
Carboxyhemoglobin: 2.1 % — ABNORMAL HIGH (ref 0.5–1.5)
Methemoglobin: 1.4 % (ref 0.0–1.5)
O2 Saturation: 83.1 %
Total hemoglobin: 8.3 g/dL — ABNORMAL LOW (ref 12.0–16.0)

## 2016-11-06 LAB — CULTURE, RESPIRATORY W GRAM STAIN: Culture: NO GROWTH

## 2016-11-06 LAB — POCT I-STAT, CHEM 8
BUN: 8 mg/dL (ref 6–20)
CALCIUM ION: 1.18 mmol/L (ref 1.15–1.40)
CHLORIDE: 101 mmol/L (ref 101–111)
CREATININE: 0.7 mg/dL (ref 0.44–1.00)
GLUCOSE: 99 mg/dL (ref 65–99)
HCT: 24 % — ABNORMAL LOW (ref 36.0–46.0)
Hemoglobin: 8.2 g/dL — ABNORMAL LOW (ref 12.0–15.0)
POTASSIUM: 3.6 mmol/L (ref 3.5–5.1)
Sodium: 140 mmol/L (ref 135–145)
TCO2: 26 mmol/L (ref 0–100)

## 2016-11-06 LAB — PREPARE PLATELET PHERESIS: Unit division: 0

## 2016-11-06 LAB — CALCIUM, IONIZED: CALCIUM, IONIZED, SERUM: 3.9 mg/dL — AB (ref 4.5–5.6)

## 2016-11-06 LAB — PREPARE RBC (CROSSMATCH)

## 2016-11-06 LAB — CBC
HCT: 24.3 % — ABNORMAL LOW (ref 36.0–46.0)
HCT: 25.2 % — ABNORMAL LOW (ref 36.0–46.0)
Hemoglobin: 8.5 g/dL — ABNORMAL LOW (ref 12.0–15.0)
Hemoglobin: 8.7 g/dL — ABNORMAL LOW (ref 12.0–15.0)
MCH: 28.9 pg (ref 26.0–34.0)
MCH: 28.4 pg (ref 26.0–34.0)
MCHC: 35 g/dL (ref 30.0–36.0)
MCHC: 34.5 g/dL (ref 30.0–36.0)
MCV: 82.7 fL (ref 78.0–100.0)
MCV: 82.4 fL (ref 78.0–100.0)
PLATELETS: 83 10*3/uL — AB (ref 150–400)
PLATELETS: 72 10*3/uL — AB (ref 150–400)
RBC: 2.94 MIL/uL — ABNORMAL LOW (ref 3.87–5.11)
RBC: 3.06 MIL/uL — AB (ref 3.87–5.11)
RDW: 14.8 % (ref 11.5–15.5)
RDW: 14.7 % (ref 11.5–15.5)
WBC: 7.4 10*3/uL (ref 4.0–10.5)
WBC: 6.7 10*3/uL (ref 4.0–10.5)

## 2016-11-06 LAB — PROTIME-INR
INR: 1.53
Prothrombin Time: 18.6 seconds — ABNORMAL HIGH (ref 11.4–15.2)

## 2016-11-06 LAB — CULTURE, RESPIRATORY

## 2016-11-06 LAB — HEPARIN INDUCED PLATELET AB (HIT ANTIBODY): Heparin Induced Plt Ab: 0.138 OD (ref 0.000–0.400)

## 2016-11-06 MED ORDER — SODIUM CHLORIDE 0.9 % IV SOLN
30.0000 meq | Freq: Once | INTRAVENOUS | Status: AC
  Administered 2016-11-06: 30 meq via INTRAVENOUS
  Filled 2016-11-06: qty 15

## 2016-11-06 MED ORDER — SODIUM CHLORIDE 0.9 % IV SOLN
Freq: Once | INTRAVENOUS | Status: AC
  Administered 2016-11-06: 18:00:00 via INTRAVENOUS

## 2016-11-06 MED ORDER — SODIUM CHLORIDE 0.9 % IV SOLN
Freq: Once | INTRAVENOUS | Status: AC
  Administered 2016-11-06: 10:00:00 via INTRAVENOUS

## 2016-11-06 MED ORDER — SODIUM CHLORIDE 0.9 % IV SOLN: Freq: Once | INTRAVENOUS | Status: AC

## 2016-11-06 MED ORDER — ALBUMIN HUMAN 5 % IV SOLN
INTRAVENOUS | Status: AC
  Administered 2016-11-06: 12.5 g
  Filled 2016-11-06: qty 250

## 2016-11-06 NOTE — Progress Notes (Signed)
Good PA waveforms. Pulmonary artery pressures are stable at this time and Nitric Oxide is being wean. 12ppm. RN aware/bedside. RT will continue to monitor patient.

## 2016-11-06 NOTE — Progress Notes (Signed)
Pharmacy Antibiotic Note  Christina Morrison is a 40 y.o. female s/p CABG x3 and coded as soon as she arrived to 2S. Sternotomy done at bedside and she was taken back to the OR for emergent redo CABG x1. She now has a left and right Centrimag. Pharmacy consulted to dose vancomycin for prophylaxis.   Patient's renal function has been stable and her vancomycin trough is therapeutic at 15 mcg/mL.  She remains afebrile and her WBC is WNL.   Plan: - Continue Vanc 1gm IV Q12H - Continue Zinacef 1.5gm IV Q12H per MD - Monitor renal fxn, clinical progress, vanc trough PRN - Continue Angiomax per MD; f/u HIT panel - F/U weaning either Precedex or Versed as able   Height: 5\' 2"  (157.5 cm) Weight: 184 lb 1.4 oz (83.5 kg) IBW/kg (Calculated) : 50.1  Temp (24hrs), Avg:97.6 F (36.4 C), Min:95 F (35 C), Max:99.7 F (37.6 C)   Recent Labs Lab 11/04/16 0630 11/04/16 1613 11/04/16 1700 11/05/16 0215 11/05/16 1605 11/06/16 0400 11/06/16 0900  WBC 10.1  --  12.6* 13.4* 8.4 7.4  --   CREATININE  --  0.90 1.00 0.82 0.70 0.87  --   VANCOTROUGH  --   --   --   --   --   --  15    Estimated Creatinine Clearance: 86.2 mL/min (by C-G formula based on SCr of 0.87 mg/dL).    Allergies  Allergen Reactions  . Ranexa [Ranolazine] Nausea Only    Dizziness (only the 1,000 mg dose has this effect)    Vanc 11/20 >> Zinacef 11/20 >>  11/23 VT = 15 mcg/mL on 1g q12 >> no change  11/17 MRSA PCR - negative 11/20 resp cx - neg   Praise Dolecki D. Laney Potashang, PharmD, BCPS Pager:  [phone] 11/06/2016, 10:45 AM

## 2016-11-06 NOTE — Progress Notes (Signed)
3 Days Post-Op Procedure(s) (LRB): EMERGENCY REDO STERNOTOMY IN ICU, REDO CABG X 1, PLACEMENT OF BILATERAL VAD, PLACEMENT OF LEFT FEMORAL ARTERIAL LINE (N/A) Subjective:  Remains on bivad support with good pump flows  LVAD 3200 rpm, 4.9L RVAD 2800 rpm, 4.3L  Objective: Vital signs in last 24 hours: Temp:  [95 F (35 C)-99.7 F (37.6 C)] 97.2 F (36.2 C) (11/23 0800) Pulse Rate:  [76-98] 80 (11/23 0800) Cardiac Rhythm: Normal sinus rhythm (11/23 0800) Resp:  [12-20] 15 (11/23 0800) BP: (89-107)/(65-84) 95/68 (11/23 0722) SpO2:  [77 %-100 %] 100 % (11/23 0800) Arterial Line BP: (49-99)/(44-86) 87/82 (11/23 0800) FiO2 (%):  [40 %] 40 % (11/23 0800) Weight:  [83.5 kg (184 lb 1.4 oz)] 83.5 kg (184 lb 1.4 oz) (11/23 0443)  Hemodynamic parameters for last 24 hours: PAP: (18-34)/(11-23) 30/18 CVP:  [3 mmHg-11 mmHg] 10 mmHg CO:  [2.9 L/min-4 L/min] 4 L/min CI:  [1.7 L/min/m2-2.3 L/min/m2] 2.3 L/min/m2  Intake/Output from previous day: 11/22 0701 - 11/23 0700 In: 6874.6 [I.V.:3591; Blood:923.7; NG/GT:30; IV Piggyback:2330] Out: 7965 [Urine:7030; Emesis/NG output:150; Drains:25; Chest Tube:760] Intake/Output this shift: Total I/O In: 298.8 [I.V.:123.8; Other:175] Out: 205 [Urine:150; Drains:25; Chest Tube:30]  General appearance: sedated on vent Neurologic: sedated on fentanyl and versed but did wake up postop Heart: regular rate and rhythm, S1, S2 normal, no murmur, click, rub or gallop Lungs: clear to auscultation bilaterally Abdomen: soft, hypoactive bowel sounds Extremities: edema mild Wound: chest VAC dressing intact Chest tube output fairly low, thin serosanguinous  Lab Results:  Recent Labs  11/05/16 1605 11/05/16 2200 11/06/16 0400  WBC 8.4  --  7.4  HGB 7.0*  6.8* 9.3* 8.5*  HCT 19.7*  20.0* 26.4* 24.3*  PLT 63*  --  83*   BMET:  Recent Labs  11/05/16 0215 11/05/16 1605 11/06/16 0400  NA 139 139 138  K 3.9 3.6 3.6  CL 107 101 107  CO2 28  --  26   GLUCOSE 103* 100* 109*  BUN 14 12 9   CREATININE 0.82 0.70 0.87  CALCIUM 6.6*  --  7.7*    PT/INR:  Recent Labs  11/06/16 0400  LABPROT 18.6*  INR 1.53   ABG    Component Value Date/Time   PHART 7.368 11/06/2016 0358   HCO3 25.7 11/06/2016 0358   TCO2 27 11/06/2016 0358   ACIDBASEDEF 3.0 (H) 11/03/2016 1950   O2SAT 83.1 11/06/2016 0402   CBG (last 3)   Recent Labs  11/06/16 0554 11/06/16 0658 11/06/16 0802  GLUCAP 130* 106* 99   CXR: clear  Assessment/Plan: S/P Procedure(s) (LRB): EMERGENCY REDO STERNOTOMY IN ICU, REDO CABG X 1, PLACEMENT OF BILATERAL VAD, PLACEMENT OF LEFT FEMORAL ARTERIAL LINE (N/A)  She remains very stable on BIVAD support POD 3. Continue Milrinone 0.3, neo as needed and wean NO to 10 ppm since PAP are normal. She has good pulsatility in arterial and PA waveforms suggesting return of LV/RV function, although still on full support. Pulmonary and renal function normal, CXR clear  Acute blood loss anemia: Hgb 8.5 this am, down from 9.3 last pm. Will give her a unit of PRBC's. I don't think she is actively bleeding, most likely just trickle down effect. The left groin and thigh hematoma from arterial line looks stable.  Thrombocytopenia: plts up to 83 this am. Heparin switched to Bivalirudin. HIT panel pending. PTT on ly 43 this am so will increase Bival to 0.06 and repeat PTT this afternoon.  Plan return to OR tomorrow  for washout of mediastinum and possible weaning off BIVAD support by Dr. Donata ClayVan Trigt. Will get consent from family.   LOS: 6 days    Alleen BorneBryan K Samanthajo Payano 11/06/2016

## 2016-11-06 NOTE — Progress Notes (Signed)
[place] for bivalirudin Indication:   Allergies  Allergen Reactions  . Ranexa [Ranolazine] Nausea Only    Dizziness (only the 1,000 mg dose has this effect)    Patient Measurements: Height: _0  (157.5 cm) Weight: 184 lb 1.4 oz (83.5 kg) IBW/kg (Calculated) : 50.1   Vital Signs: Temp: 97 F (36.1 C) (11/23 1630) Temp Source: Core (Comment) (11/23 1600) BP: 102/72 (11/23 1600) Pulse Rate: 80 (11/23 1630)  Labs:  Recent Labs  11/04/16 0300  11/05/16 0215 11/05/16 1605 11/05/16 2023 11/05/16 2200 11/06/16 0400 11/06/16 1600 11/06/16 1605  HGB 6.4*  < > 10.6* 7.0*  6.8*  --  9.3* 8.5* 8.7*  8.2*  --   HCT 18.0*  < > 30.4* 19.7*  20.0*  --  26.4* 24.3* 25.2*  24.0*  --   PLT 114*  < > 81* 63*  --   --  83* 72*  --   APTT 41*  --  135*  --  55*  --  44*  --  73*  LABPROT 19.4*  --  17.4*  --   --   --  18.6*  --   --   INR 1.62  --  1.41  --   --   --  1.53  --   --   HEPARINUNFRC <0.10*  --  0.29*  --   --   --   --   --   --   CREATININE 1.02*  < > 0.82 0.70  --   --  0.87 0.70  --   < > = values in this interval not displayed.  Estimated Creatinine Clearance: 93.7 mL/min (by C-G formula based on SCr of 0.7 mg/dL).   Medical History: Past Medical History:  Diagnosis Date  . Anxiety   . Bipolar 1 disorder ([place])   . Cardiac arrest ([place]) 11/03/2016  . Cardiogenic shock ([place]) 11/03/2016  . Chronic pain   . Cocaine use   . Coronary artery disease    a. 03/2015 NSTEMI/PCI in setting of cocaine use - BMS to diagonal. b. NSTEMI 12/2015 s/o overlapping DES to Cx, residual mild RCA and LAD disease, 75% OM1.  . Depression   . Dyslipidemia   . Migraine   . Osteoarthritis   . Polysubstance abuse    a. tobacco/cocaine  . 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 Scoliosis   . TIA (transient ischemic attack)     Medications:  Scheduled:  . sodium chloride   Intravenous  Once  . cefUROXime (ZINACEF)  IV  1.5 g Intravenous Q12H  . chlorhexidine gluconate (MEDLINE KIT)  15 mL Mouth Rinse BID  . famotidine (PEPCID) IV  20 mg Intravenous Q12H  . furosemide  20 mg Intravenous Q6H  . insulin regular  0-10 Units Intravenous TID WC  . mouth rinse  15 mL Mouth Rinse 10 times per day  . mupirocin ointment  1 application Topical BID  . potassium chloride (KCL MULTIRUN) 30 mEq in 265 mL IVPB  30 mEq Intravenous Once  . sodium chloride flush  10-40 mL Intracatheter Q12H  . sodium chloride flush  3 mL Intravenous Q12H  . vancomycin  1,000 mg Intravenous Q12H   Infusions:  . sodium chloride Stopped (11/05/16 0800)  . sodium chloride    . sodium chloride 20 mL/hr at 11/06/16 0900  . bivalirudin (ANGIOMAX) infusion  0.5 mg/mL (Non-ACS indications) 0.06 mg/kg/hr (11/06/16 1600)  . dexmedetomidine 1.2 mcg/kg/hr (11/06/16 1627)  . fentaNYL infusion INTRAVENOUS 325 mcg/hr (11/06/16 1600)  . insulin (NOVOLIN-R) infusion Stopped (11/06/16 0800)  . lactated ringers 20 mL/hr at 11/06/16 1600  . lactated ringers 20 mL/hr at 11/06/16 1628  . midazolam (VERSED) infusion 3 mg/hr (11/06/16 1600)  . milrinone 0.3 mcg/kg/min (11/06/16 1600)  . norepinephrine (LEVOPHED) Adult infusion    . phenylephrine (NEO-SYNEPHRINE) Adult infusion 10 mcg/min (11/06/16 1600)  . vasopressin (PITRESSIN) infusion - *FOR SHOCK*      Assessment: 40 yo female s/p CABG on 11/20 then with VF arrest then re-do sternotomy re-do CABG x1 with placement of biVAD support. Pharmacy asked to dose a direct thrombin inhibitor for r/o HIT. No bival increases per MD request. Checking 0500/1700 aPTTs.  -heparin initially started 11/17 with platelet count= 249). 11/23: platelet count= 72 -presurgery platelet count= 167 (11/20) -Hg today 8.7, s/p 4 units PRBC  APTT within range at 73 this afternoon. No bleed documented.   Goal of Therapy:  APTT goal 60-80 per Dr. Lawson Fiscal Monitor platelets by anticoagulation  protocol: Yes   Plan:  -Bivalirudin at 0.79m/kg/hr -aPTT at 0500 and 1700 per MD -CBC daily -HIT antibody/SRA pending  HElicia Lamp PharmD, BCPS Clinical Pharmacist 11/06/2016 5:00 PM

## 2016-11-06 NOTE — Progress Notes (Signed)
Patient ID: Evalyn CascoKelly Schneider, female   DOB: [birth year], 40 y.o.   MRN: [id number]  SICU Evening Rounds  Hemodynamically stable with good pump flows  Very pulsatile arterial and PA waveforms.  CVP dropped to 6 today with diuresis -2L so giving another unit of PRBC's tonight in anticipation of surgery tomorrow.  NO down to 14. PAP low normal.  Excellent UO.  CBC    Component Value Date/Time   WBC 6.7 11/06/2016 1600   RBC 3.06 (L) 11/06/2016 1600   HGB 8.7 (L) 11/06/2016 1600   HGB 8.2 (L) 11/06/2016 1600   HCT 25.2 (L) 11/06/2016 1600   HCT 24.0 (L) 11/06/2016 1600   PLT 72 (L) 11/06/2016 1600   MCV 82.4 11/06/2016 1600   MCH 28.4 11/06/2016 1600   MCHC 34.5 11/06/2016 1600   RDW 14.7 11/06/2016 1600   LYMPHSABS 1.5 10/30/2016 2000   MONOABS 0.4 10/30/2016 2000   EOSABS 0.1 10/30/2016 2000   BASOSABS 0.0 10/30/2016 2000   BMET    Component Value Date/Time   NA 140 11/06/2016 1600   K 3.6 11/06/2016 1600   CL 101 11/06/2016 1600   CO2 26 11/06/2016 0400   GLUCOSE 99 11/06/2016 1600   BUN 8 11/06/2016 1600   CREATININE 0.70 11/06/2016 1600   CREATININE 0.79 12/20/2015 1218   CALCIUM 7.7 (L) 11/06/2016 0400   GFRNONAA >60 11/06/2016 0400   GFRNONAA >89 01/24/2015 1537   GFRAA >60 11/06/2016 0400   GFRAA >89 01/24/2015 1537    Plan return to OR in the am for washout and possible decannulation. Consent signed by family.

## 2016-11-06 NOTE — Progress Notes (Signed)
Error

## 2016-11-06 NOTE — Progress Notes (Signed)
Advanced Heart Failure Rounding Note   Subjective:    40 y/o woman with HTN, CAD and tobacco use. Underwent stenting of LCX in 1/17. Readmitted for NSTEMI. Found to have 2v CAD with high-grade ostial LAD and LCX stensosis. Underwent CABG with Dr. Roxy Manns with LIMA to LAD, SVG to DIAG and SVG to LCX. Developed VF arrest and had re-do sternotomy begun at bedside with cardia massage. 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 Underwent re-do CABG x1 with SVG to OM and placement of biVAD support due to shock.   Remains on bivad support. Arterial flow turned down to 3000 yesterday. Still having chatter on arterial line. Flows very good. Good pulsativity on both lines. Groin hematoma stable. PLTs low. Now on bival. HIT assay pending. Over 4.5L out yesterday   Intubated on 40% FiO2 but responds to commands when awake.   CVP 10-12. PA pressures low. CXR has cleared. L subclavian line directed cephalad.   On milrinone 0.25 and phenylephrine 15 MAP ~70.   LVAD speed 3200 Flow 4.9 L/min RVAD speed 2700 4.3 L/min   Objective:   Weight Range:  Vital Signs:   Temp:  [95 F (35 C)-99.7 F (37.6 C)] 97.9 F (36.6 C) (11/23 1015) Pulse Rate:  [76-98] 82 (11/23 1015) Resp:  [12-20] 15 (11/23 1015) BP: (87-107)/(59-84) 90/62 (11/23 1000) SpO2:  [77 %-100 %] 100 % (11/23 1015) Arterial Line BP: (49-98)/(44-86) 89/68 (11/23 1015) FiO2 (%):  [40 %] 40 % (11/23 0945) Weight:  [83.5 kg (184 lb 1.4 oz)] 83.5 kg (184 lb 1.4 oz) (11/23 0443) Last BM Date:  (PTA)  Weight change: Filed Weights   11/04/16 0500 11/05/16 0500 11/06/16 0443  Weight: 82.4 kg (181 lb 10.5 oz) 84.1 kg (185 lb 6.5 oz) 83.5 kg (184 lb 1.4 oz)    Intake/Output:   Intake/Output Summary (Last 24 hours) at 11/06/16 1108 Last data filed at 11/06/16 1015  Gross per 24 hour  Intake          6584.23 ml  Output             7805 ml  Net         -1220.77 ml     Physical Exam: General:  Intubated. Sedated  HEENT: normal ETT Neck:  supple.RIJ swan Cor: Chest open with wound vac. Bilateral VADs Lungs: coarse Abdomen: soft, nontender, +distended. quiet Extremities: warm 2+ edema. L groin hematoma stable Neuro: intubated sedated   Telemetry: SR 80s (viewed personally)  Labs: Basic Metabolic Panel:  Recent Labs Lab 11/02/16 0037 11/03/16 0406  11/04/16 0300 11/04/16 1613 11/04/16 1700 11/05/16 0215 11/05/16 1605 11/06/16 0400  NA 141 140  < > 144 144  --  139 139 138  K 3.7 3.4*  < > 3.7 3.4*  --  3.9 3.6 3.6  CL 109 107  < > 106 100*  --  107 101 107  CO2 25 25  --  28  --   --  28  --  26  GLUCOSE 118* 106*  < > 149* 108*  --  103* 100* 109*  BUN 6 5*  < > 10 16  --  '14 12 9  '$ CREATININE 0.73 0.79  < > 1.02* 0.90 1.00 0.82 0.70 0.87  CALCIUM 9.1 8.8*  --  6.6*  --   --  6.6*  --  7.7*  MG  --   --   --  2.9*  --  2.2  --   --   --   < > =  values in this interval not displayed.  Liver Function Tests:  Recent Labs Lab 10/30/16 2000 11/02/16 0037 11/05/16 0215 11/06/16 0400  AST 19 18 128* 47*  ALT 15 16 70* 42  ALKPHOS 106 103 39 38  BILITOT 0.7 0.5 1.0 1.5*  PROT 6.8 6.7 4.1* 4.7*  ALBUMIN 4.1 3.7 2.6* 3.2*   No results for input(s): LIPASE, AMYLASE in the last 168 hours. No results for input(s): AMMONIA in the last 168 hours.  CBC:  Recent Labs Lab 10/30/16 2000  11/04/16 0630  11/04/16 1700 11/04/16 2055 11/05/16 0215 11/05/16 1605 11/05/16 2200 11/06/16 0400  WBC 5.8  < > 10.1  --  12.6*  --  13.4* 8.4  --  7.4  NEUTROABS 3.9  --   --   --   --   --   --   --   --   --   HGB 13.3  < > 9.0*  < > 9.8* 10.4* 10.6* 7.0*  6.8* 9.3* 8.5*  HCT 38.0  < > 25.6*  < > 27.7* 29.7* 30.4* 19.7*  20.0* 26.4* 24.3*  MCV 88.2  < > 86.2  --  85.8  --  84.2 84.2  --  82.7  PLT 249  < > 116*  --  103*  --  81* 63*  --  83*  < > = values in this interval not displayed.  Cardiac Enzymes:  Recent Labs Lab 10/31/16 0023 10/31/16 0225 10/31/16 1036 11/01/16 1150 11/02/16 0827  TROPONINI  0.09* 0.09* 0.06* 0.04* <0.03    BNP: BNP (last 3 results) No results for input(s): BNP in the last 8760 hours.  ProBNP (last 3 results) No results for input(s): PROBNP in the last 8760 hours.    Other results:  Imaging: Dg Chest 1 View  Result Date: 11/06/2016 CLINICAL DATA:  Postop heart surgery.  Endotracheal tube. EXAM: CHEST 1 VIEW COMPARISON:  11/05/2016; 10/30/2016; 06/09/2015 FINDINGS: Grossly unchanged cardiac silhouette and mediastinal contours. Stable position of support apparatus including malpositioned left subclavian vein approach central venous catheter which courses cranially likely within the left internal jugular vein, tip excluded from view. Re- demonstrated chest tube, mediastinal drains and ventricular assist devices. Improved aeration of the lungs with persistent perihilar opacities, likely atelectasis. No new focal airspace opacities. No pleural effusion or pneumothorax. No acute osseus abnormalities. IMPRESSION: 1. Stable positioning of support apparatus including malpositioned left subclavian vein approach central venous catheter which courses cranially, likely within the left internal jugular vein, tip excluded from view. No pneumothorax. 2. Improved aeration along suggests improved edema and/or atelectasis. No new focal airspace opacities. Electronically Signed   By: Sandi Mariscal M.D.   On: 11/06/2016 10:07   Dg Chest Port 1 View  Result Date: 11/05/2016 CLINICAL DATA:  Status post coronary bypass grafting EXAM: PORTABLE CHEST 1 VIEW COMPARISON:  11/04/2016 FINDINGS: Cardiac shadow is stable. Postoperative changes are noted. Bilateral thoracostomy catheters are again identified. Mediastinal drain and apparent pericardial drains are noted and stable. Multiple radiopaque tubes are identified overlying the chest consistent with the history of biventricular assist device. No pneumothorax is noted. No sizable effusion is seen. Swan-Ganz catheter is noted within the  pulmonary outflow tract. Endotracheal tube and nasogastric catheter are noted in satisfactory position. Left subclavian central line is identified extending into the left internal jugular vein. No bony abnormality is noted. IMPRESSION: Tubes and lines as described above. Continued improved aeration without focal infiltrate or sizable pneumothorax. Electronically Signed   By: Elta Guadeloupe  Lukens M.D.   On: 11/05/2016 08:00     Medications:     Scheduled Medications: . sodium chloride   Intravenous Once  . cefUROXime (ZINACEF)  IV  1.5 g Intravenous Q12H  . chlorhexidine gluconate (MEDLINE KIT)  15 mL Mouth Rinse BID  . famotidine (PEPCID) IV  20 mg Intravenous Q12H  . furosemide  20 mg Intravenous Q6H  . insulin regular  0-10 Units Intravenous TID WC  . mouth rinse  15 mL Mouth Rinse 10 times per day  . mupirocin ointment  1 application Topical BID  . sodium chloride flush  10-40 mL Intracatheter Q12H  . sodium chloride flush  3 mL Intravenous Q12H  . vancomycin  1,000 mg Intravenous Q12H    Infusions: . sodium chloride Stopped (11/05/16 0800)  . sodium chloride    . sodium chloride 20 mL/hr at 11/06/16 0900  . bivalirudin (ANGIOMAX) infusion 0.5 mg/mL (Non-ACS indications) 0.06 mg/kg/hr (11/06/16 1000)  . dexmedetomidine 1.2 mcg/kg/hr (11/06/16 0900)  . fentaNYL infusion INTRAVENOUS 325 mcg/hr (11/06/16 0900)  . insulin (NOVOLIN-R) infusion Stopped (11/06/16 0800)  . lactated ringers 20 mL/hr at 11/06/16 1000  . lactated ringers 20 mL/hr at 11/06/16 1000  . midazolam (VERSED) infusion 3 mg/hr (11/06/16 1000)  . milrinone 0.3 mcg/kg/min (11/06/16 1000)  . norepinephrine (LEVOPHED) Adult infusion    . phenylephrine (NEO-SYNEPHRINE) Adult infusion 15 mcg/min (11/06/16 1000)  . vasopressin (PITRESSIN) infusion - *FOR SHOCK*      PRN Medications: sodium chloride, albumin human, lactated ringers, metoprolol, midazolam, morphine injection, ondansetron (ZOFRAN) IV, sodium chloride flush,  sodium chloride flush, vasopressin (PITRESSIN) infusion - *FOR SHOCK*   Assessment:   1. Cardiogenic shock with acute biventricular failure 2. VF arrest 3. CAD s/p CABG with emergent re-do 4. NSTEMI 5. Acute respiratory failure 6. Acute blood loss anemia 7. Hypokalemia 8. Left groin hematoma 8. Thrmobocytopenia ? HIT  Plan/Discussion:    She is critically ill requiring full biVAD support. Currently flows and cardiac output look good. Given chatter on arterial line will turn down to 3000.  Keep RVAD at 2800 for now.    Continue pressor support. Strive to keep CVP around 8-10. Continue diuresis. If MAP drops would increase phenylephrine. Can wean NO with low PA pressures.   Currently maintaining NSR. Replace blood products as needed.   I have d/w Dr. Cyndia Bent. Plan will be to go to OR tomorrow for TEE, washout and possible VAD removal.   We will be available 24/7 to support the surgical team as needed.   The patient is critically ill with multiple organ systems failure and requires high complexity decision making for assessment and support, frequent evaluation and titration of therapies, application of advanced monitoring technologies and extensive interpretation of multiple databases.   Critical Care Time devoted to patient care services described in this note is 35 Minutes.   Length of Stay: 6   Damondre Pfeifle MD 11/06/2016, 11:08 AM  Advanced Heart Failure Team Pager [phone] (M-F; [address])  Please contact [facility] for night-coverage after hours (4p -7a ) and weekends on [email or web address]

## 2016-11-07 ENCOUNTER — Inpatient Hospital Stay (HOSPITAL_COMMUNITY): Payer: Medicaid Other | Admitting: Anesthesiology

## 2016-11-07 ENCOUNTER — Encounter (HOSPITAL_COMMUNITY): Payer: Self-pay | Admitting: Certified Registered"

## 2016-11-07 ENCOUNTER — Encounter (HOSPITAL_COMMUNITY)
Admission: EM | Disposition: A | Discharge status: Home Health | Payer: Self-pay | Source: Home / Self Care | Attending: Thoracic Surgery (Cardiothoracic Vascular Surgery)

## 2016-11-07 ENCOUNTER — Inpatient Hospital Stay (HOSPITAL_COMMUNITY): Payer: Medicaid Other

## 2016-11-07 DIAGNOSIS — I97638 Postprocedural hematoma of a circulatory system organ or structure following other circulatory system procedure: Secondary | ICD-10-CM

## 2016-11-07 HISTORY — PX: HEMATOMA EVACUATION: SHX5118

## 2016-11-07 HISTORY — PX: STERNAL CLOSURE: SHX6203

## 2016-11-07 HISTORY — PX: TEE WITHOUT CARDIOVERSION: SHX5443

## 2016-11-07 HISTORY — PX: REMOVAL OF CENTRIMAG VENTRICULAR ASSIST DEVICE: SHX6220

## 2016-11-07 LAB — POCT I-STAT, CHEM 8
BUN: 6 mg/dL (ref 6–20)
BUN: 7 mg/dL (ref 6–20)
BUN: 5 mg/dL — AB (ref 6–20)
BUN: 6 mg/dL (ref 6–20)
BUN: 7 mg/dL (ref 6–20)
CALCIUM ION: 1.2 mmol/L (ref 1.15–1.40)
CALCIUM ION: 0.97 mmol/L — AB (ref 1.15–1.40)
CALCIUM ION: 1.1 mmol/L — AB (ref 1.15–1.40)
CHLORIDE: 99 mmol/L — AB (ref 101–111)
CHLORIDE: 98 mmol/L — AB (ref 101–111)
CHLORIDE: 102 mmol/L (ref 101–111)
CREATININE: 0.6 mg/dL (ref 0.44–1.00)
Calcium, Ion: 1.23 mmol/L (ref 1.15–1.40)
Calcium, Ion: 1.1 mmol/L — ABNORMAL LOW (ref 1.15–1.40)
Chloride: 100 mmol/L — ABNORMAL LOW (ref 101–111)
Chloride: 99 mmol/L — ABNORMAL LOW (ref 101–111)
Creatinine, Ser: 0.6 mg/dL (ref 0.44–1.00)
Creatinine, Ser: 0.6 mg/dL (ref 0.44–1.00)
Creatinine, Ser: 0.6 mg/dL (ref 0.44–1.00)
Creatinine, Ser: 0.6 mg/dL (ref 0.44–1.00)
GLUCOSE: 105 mg/dL — AB (ref 65–99)
GLUCOSE: 137 mg/dL — AB (ref 65–99)
Glucose, Bld: 130 mg/dL — ABNORMAL HIGH (ref 65–99)
Glucose, Bld: 170 mg/dL — ABNORMAL HIGH (ref 65–99)
Glucose, Bld: 172 mg/dL — ABNORMAL HIGH (ref 65–99)
HCT: 27 % — ABNORMAL LOW (ref 36.0–46.0)
HCT: 29 % — ABNORMAL LOW (ref 36.0–46.0)
HCT: 31 % — ABNORMAL LOW (ref 36.0–46.0)
HEMATOCRIT: 24 % — AB (ref 36.0–46.0)
HEMATOCRIT: 26 % — AB (ref 36.0–46.0)
HEMOGLOBIN: 9.2 g/dL — AB (ref 12.0–15.0)
HEMOGLOBIN: 9.9 g/dL — AB (ref 12.0–15.0)
HEMOGLOBIN: 8.8 g/dL — AB (ref 12.0–15.0)
Hemoglobin: 8.2 g/dL — ABNORMAL LOW (ref 12.0–15.0)
Hemoglobin: 10.5 g/dL — ABNORMAL LOW (ref 12.0–15.0)
POTASSIUM: 3.3 mmol/L — AB (ref 3.5–5.1)
POTASSIUM: 3.3 mmol/L — AB (ref 3.5–5.1)
Potassium: 3.5 mmol/L (ref 3.5–5.1)
Potassium: 4.1 mmol/L (ref 3.5–5.1)
Potassium: 4.4 mmol/L (ref 3.5–5.1)
SODIUM: 140 mmol/L (ref 135–145)
SODIUM: 140 mmol/L (ref 135–145)
SODIUM: 138 mmol/L (ref 135–145)
SODIUM: 138 mmol/L (ref 135–145)
Sodium: 137 mmol/L (ref 135–145)
TCO2: 28 mmol/L (ref 0–100)
TCO2: 29 mmol/L (ref 0–100)
TCO2: 25 mmol/L (ref 0–100)
TCO2: 26 mmol/L (ref 0–100)
TCO2: 23 mmol/L (ref 0–100)

## 2016-11-07 LAB — POCT I-STAT 4, (NA,K, GLUC, HGB,HCT)
Glucose, Bld: 140 mg/dL — ABNORMAL HIGH (ref 65–99)
HCT: 33 % — ABNORMAL LOW (ref 36.0–46.0)
HEMOGLOBIN: 11.2 g/dL — AB (ref 12.0–15.0)
POTASSIUM: 3.2 mmol/L — AB (ref 3.5–5.1)
SODIUM: 141 mmol/L (ref 135–145)

## 2016-11-07 LAB — POCT I-STAT 3, ART BLOOD GAS (G3+)
Acid-Base Excess: 5 mmol/L — ABNORMAL HIGH (ref 0.0–2.0)
Acid-Base Excess: 1 mmol/L (ref 0.0–2.0)
Acid-Base Excess: 1 mmol/L (ref 0.0–2.0)
Acid-Base Excess: 2 mmol/L (ref 0.0–2.0)
BICARBONATE: 25.6 mmol/L (ref 20.0–28.0)
Bicarbonate: 29 mmol/L — ABNORMAL HIGH (ref 20.0–28.0)
Bicarbonate: 25.8 mmol/L (ref 20.0–28.0)
Bicarbonate: 25.6 mmol/L (ref 20.0–28.0)
O2 SAT: 100 %
O2 SAT: 97 %
O2 Saturation: 100 %
O2 Saturation: 100 %
PCO2 ART: 41.2 mmHg (ref 32.0–48.0)
PCO2 ART: 39.7 mmHg (ref 32.0–48.0)
PCO2 ART: 43.3 mmHg (ref 32.0–48.0)
PCO2 ART: 36.8 mmHg (ref 32.0–48.0)
PH ART: 7.456 — AB (ref 7.350–7.450)
PH ART: 7.384 (ref 7.350–7.450)
PH ART: 7.449 (ref 7.350–7.450)
PO2 ART: 165 mmHg — AB (ref 83.0–108.0)
PO2 ART: 372 mmHg — AB (ref 83.0–108.0)
PO2 ART: 83 mmHg (ref 83.0–108.0)
Patient temperature: 36.9
TCO2: 30 mmol/L (ref 0–100)
TCO2: 27 mmol/L (ref 0–100)
TCO2: 27 mmol/L (ref 0–100)
TCO2: 27 mmol/L (ref 0–100)
pH, Arterial: 7.416 (ref 7.350–7.450)
pO2, Arterial: 274 mmHg — ABNORMAL HIGH (ref 83.0–108.0)

## 2016-11-07 LAB — COMPREHENSIVE METABOLIC PANEL
ALT: 27 U/L (ref 14–54)
AST: 28 U/L (ref 15–41)
Albumin: 3.1 g/dL — ABNORMAL LOW (ref 3.5–5.0)
Alkaline Phosphatase: 42 U/L (ref 38–126)
Anion gap: 9 (ref 5–15)
BUN: 8 mg/dL (ref 6–20)
CHLORIDE: 105 mmol/L (ref 101–111)
CO2: 25 mmol/L (ref 22–32)
Calcium: 8.3 mg/dL — ABNORMAL LOW (ref 8.9–10.3)
Creatinine, Ser: 0.81 mg/dL (ref 0.44–1.00)
Glucose, Bld: 94 mg/dL (ref 65–99)
POTASSIUM: 3.5 mmol/L (ref 3.5–5.1)
SODIUM: 139 mmol/L (ref 135–145)
Total Bilirubin: 1.3 mg/dL — ABNORMAL HIGH (ref 0.3–1.2)
Total Protein: 5 g/dL — ABNORMAL LOW (ref 6.5–8.1)

## 2016-11-07 LAB — BLOOD GAS, ARTERIAL
Acid-Base Excess: 2.7 mmol/L — ABNORMAL HIGH (ref 0.0–2.0)
BICARBONATE: 27.1 mmol/L (ref 20.0–28.0)
DRAWN BY: 31233
FIO2: 40
LHR: 12 {breaths}/min
MECHVT: 450 mL
NITRIC OXIDE: 10
O2 Saturation: 99.1 %
PATIENT TEMPERATURE: 99.7
PCO2 ART: 45.4 mmHg (ref 32.0–48.0)
PEEP: 5 cmH2O
PO2 ART: 134 mmHg — AB (ref 83.0–108.0)
pH, Arterial: 7.396 (ref 7.350–7.450)

## 2016-11-07 LAB — CBC
HCT: 32.8 % — ABNORMAL LOW (ref 36.0–46.0)
HEMATOCRIT: 27.5 % — AB (ref 36.0–46.0)
HEMOGLOBIN: 9.6 g/dL — AB (ref 12.0–15.0)
Hemoglobin: 11.6 g/dL — ABNORMAL LOW (ref 12.0–15.0)
MCH: 29.1 pg (ref 26.0–34.0)
MCH: 29.2 pg (ref 26.0–34.0)
MCHC: 34.9 g/dL (ref 30.0–36.0)
MCHC: 35.4 g/dL (ref 30.0–36.0)
MCV: 83.3 fL (ref 78.0–100.0)
MCV: 82.6 fL (ref 78.0–100.0)
Platelets: 72 10*3/uL — ABNORMAL LOW (ref 150–400)
Platelets: 113 10*3/uL — ABNORMAL LOW (ref 150–400)
RBC: 3.3 MIL/uL — ABNORMAL LOW (ref 3.87–5.11)
RBC: 3.97 MIL/uL (ref 3.87–5.11)
RDW: 14.7 % (ref 11.5–15.5)
RDW: 13.9 % (ref 11.5–15.5)
WBC: 5.7 10*3/uL (ref 4.0–10.5)
WBC: 9.1 10*3/uL (ref 4.0–10.5)

## 2016-11-07 LAB — GLUCOSE, CAPILLARY
GLUCOSE-CAPILLARY: 97 mg/dL (ref 65–99)
GLUCOSE-CAPILLARY: 94 mg/dL (ref 65–99)
GLUCOSE-CAPILLARY: 116 mg/dL — AB (ref 65–99)
GLUCOSE-CAPILLARY: 116 mg/dL — AB (ref 65–99)
GLUCOSE-CAPILLARY: 126 mg/dL — AB (ref 65–99)
GLUCOSE-CAPILLARY: 113 mg/dL — AB (ref 65–99)
Glucose-Capillary: 101 mg/dL — ABNORMAL HIGH (ref 65–99)
Glucose-Capillary: 106 mg/dL — ABNORMAL HIGH (ref 65–99)

## 2016-11-07 LAB — APTT
aPTT: 66 seconds — ABNORMAL HIGH (ref 24–36)
aPTT: 40 seconds — ABNORMAL HIGH (ref 24–36)

## 2016-11-07 LAB — CALCIUM, IONIZED: Calcium, Ionized, Serum: 4.7 mg/dL (ref 4.5–5.6)

## 2016-11-07 LAB — COOXEMETRY PANEL
Carboxyhemoglobin: 2 % — ABNORMAL HIGH (ref 0.5–1.5)
Methemoglobin: 1.2 % (ref 0.0–1.5)
O2 Saturation: 75.2 %
Total hemoglobin: 9.8 g/dL — ABNORMAL LOW (ref 12.0–16.0)

## 2016-11-07 LAB — CREATININE, SERUM
Creatinine, Ser: 0.72 mg/dL (ref 0.44–1.00)
GFR calc Af Amer: >60 mL/min (ref >60)
GFR calc non Af Amer: >60 mL/min (ref >60)

## 2016-11-07 LAB — MAGNESIUM: Magnesium: 2.6 mg/dL — ABNORMAL HIGH (ref 1.7–2.4)

## 2016-11-07 LAB — PROTIME-INR
INR: 1.43
INR: 1.38
Prothrombin Time: 17.6 seconds — ABNORMAL HIGH (ref 11.4–15.2)
Prothrombin Time: 17.1 seconds — ABNORMAL HIGH (ref 11.4–15.2)

## 2016-11-07 LAB — ECHO INTRAOPERATIVE TEE
Height: 62 in
WEIGHTICAEL: 2832.47 [oz_av]

## 2016-11-07 SURGERY — CLOSURE, STERNUM
Anesthesia: General | Site: Thigh

## 2016-11-07 MED ORDER — SODIUM CHLORIDE 0.9 % IV SOLN: Freq: Once | INTRAVENOUS | Status: DC

## 2016-11-07 MED ORDER — VANCOMYCIN HCL IN DEXTROSE 1-5 GM/200ML-% IV SOLN
1000.0000 mg | Freq: Two times a day (BID) | INTRAVENOUS | Status: AC
Start: 2016-11-07 — End: 2016-11-08
  Administered 2016-11-07 – 2016-11-08 (×3): 1000 mg via INTRAVENOUS
  Filled 2016-11-07 (×3): qty 200

## 2016-11-07 MED ORDER — VECURONIUM BROMIDE 10 MG IV SOLR
INTRAVENOUS | Status: DC | PRN
Start: 2016-11-07 — End: 2016-11-07
  Administered 2016-11-07: 4 mg via INTRAVENOUS
  Administered 2016-11-07: 3 mg via INTRAVENOUS
  Administered 2016-11-07: 6 mg via INTRAVENOUS
  Administered 2016-11-07: 3 mg via INTRAVENOUS
  Administered 2016-11-07: 4 mg via INTRAVENOUS

## 2016-11-07 MED ORDER — HEPARIN SODIUM (PORCINE) 1000 UNIT/ML IJ SOLN
INTRAMUSCULAR | Status: AC
  Filled 2016-11-07: qty 2

## 2016-11-07 MED ORDER — METOPROLOL TARTRATE 5 MG/5ML IV SOLN: 2.5000 mg | INTRAVENOUS | Status: DC | PRN

## 2016-11-07 MED ORDER — NITROGLYCERIN IN D5W 200-5 MCG/ML-% IV SOLN: 0.0000 ug/min | INTRAVENOUS | Status: DC

## 2016-11-07 MED ORDER — PROTAMINE SULFATE 10 MG/ML IV SOLN
INTRAVENOUS | Status: AC
  Filled 2016-11-07: qty 25

## 2016-11-07 MED ORDER — 0.9 % SODIUM CHLORIDE (POUR BTL) OPTIME
TOPICAL | Status: DC | PRN
  Administered 2016-11-07: 5000 mL
  Administered 2016-11-07: 1000 mL

## 2016-11-07 MED ORDER — DEXTROSE 5 % IV SOLN
0.0000 ug/min | INTRAVENOUS | Status: DC
  Filled 2016-11-07: qty 4

## 2016-11-07 MED ORDER — MAGNESIUM SULFATE 4 GM/100ML IV SOLN
4.0000 g | Freq: Once | INTRAVENOUS | Status: AC
  Administered 2016-11-07: 4 g via INTRAVENOUS
  Filled 2016-11-07: qty 100

## 2016-11-07 MED ORDER — INSULIN ASPART 100 UNIT/ML ~~LOC~~ SOLN
0.0000 [IU] | SUBCUTANEOUS | Status: DC
  Administered 2016-11-07 – 2016-11-09 (×5): 2 [IU] via SUBCUTANEOUS
  Administered 2016-11-10: 4 [IU] via SUBCUTANEOUS
  Administered 2016-11-10: 8 [IU] via SUBCUTANEOUS

## 2016-11-07 MED ORDER — ACETAMINOPHEN 160 MG/5ML PO SOLN
1000.0000 mg | Freq: Four times a day (QID) | ORAL | Status: DC
  Administered 2016-11-07 – 2016-11-10 (×10): 1000 mg
  Filled 2016-11-07 (×10): qty 40.6

## 2016-11-07 MED ORDER — LACTATED RINGERS IV SOLN: INTRAVENOUS | Status: DC

## 2016-11-07 MED ORDER — ACETAMINOPHEN 160 MG/5ML PO SOLN: 650.0000 mg | Freq: Once | ORAL | Status: AC

## 2016-11-07 MED ORDER — FENTANYL 2500MCG IN NS 250ML (10MCG/ML) PREMIX INFUSION
25.0000 ug/h | INTRAVENOUS | Status: DC
  Administered 2016-11-07 – 2016-11-09 (×7): 350 ug/h via INTRAVENOUS
  Administered 2016-11-09 – 2016-11-10 (×2): 400 ug/h via INTRAVENOUS
  Filled 2016-11-07 (×10): qty 250

## 2016-11-07 MED ORDER — ACETAMINOPHEN 500 MG PO TABS
1000.0000 mg | ORAL_TABLET | Freq: Four times a day (QID) | ORAL | Status: AC
  Administered 2016-11-10 – 2016-11-12 (×8): 1000 mg via ORAL
  Filled 2016-11-07 (×8): qty 2

## 2016-11-07 MED ORDER — MILRINONE LACTATE IN DEXTROSE 20-5 MG/100ML-% IV SOLN
0.0000 ug/kg/min | INTRAVENOUS | Status: DC
  Administered 2016-11-07 – 2016-11-10 (×5): 0.3 ug/kg/min via INTRAVENOUS
  Filled 2016-11-07 (×7): qty 100

## 2016-11-07 MED ORDER — ACETAMINOPHEN 650 MG RE SUPP
650.0000 mg | Freq: Once | RECTAL | Status: AC
  Administered 2016-11-07: 650 mg via RECTAL

## 2016-11-07 MED ORDER — SODIUM CHLORIDE 0.9 % IV SOLN
30.0000 meq | Freq: Once | INTRAVENOUS | Status: AC
  Administered 2016-11-07: 30 meq via INTRAVENOUS
  Filled 2016-11-07: qty 15

## 2016-11-07 MED ORDER — SODIUM CHLORIDE 0.9 % IV SOLN
INTRAVENOUS | Status: DC
  Filled 2016-11-07: qty 2.5

## 2016-11-07 MED ORDER — SUFENTANIL CITRATE 50 MCG/ML IV SOLN
INTRAVENOUS | Status: DC | PRN
  Administered 2016-11-07 (×2): 10 ug via INTRAVENOUS
  Administered 2016-11-07: 30 ug via INTRAVENOUS

## 2016-11-07 MED ORDER — SODIUM CHLORIDE 0.9 % IJ SOLN
INTRAMUSCULAR | Status: AC
  Filled 2016-11-07: qty 10

## 2016-11-07 MED ORDER — BISACODYL 5 MG PO TBEC
10.0000 mg | DELAYED_RELEASE_TABLET | Freq: Every day | ORAL | Status: DC
  Administered 2016-11-08: 10 mg via ORAL
  Filled 2016-11-07 (×2): qty 2

## 2016-11-07 MED ORDER — METOPROLOL TARTRATE 25 MG/10 ML ORAL SUSPENSION: 12.5000 mg | Freq: Two times a day (BID) | ORAL | Status: DC

## 2016-11-07 MED ORDER — EPINEPHRINE PF 1 MG/ML IJ SOLN
0.0000 ug/min | INTRAVENOUS | Status: DC
  Administered 2016-11-08 – 2016-11-09 (×2): 2 ug/min via INTRAVENOUS
  Filled 2016-11-07 (×4): qty 4

## 2016-11-07 MED ORDER — FAMOTIDINE IN NACL 20-0.9 MG/50ML-% IV SOLN
20.0000 mg | Freq: Two times a day (BID) | INTRAVENOUS | Status: AC
  Administered 2016-11-07 (×2): 20 mg via INTRAVENOUS
  Filled 2016-11-07: qty 50

## 2016-11-07 MED ORDER — HEMOSTATIC AGENTS (NO CHARGE) OPTIME
TOPICAL | Status: DC | PRN
  Administered 2016-11-07 (×3): 1 via TOPICAL

## 2016-11-07 MED ORDER — PANTOPRAZOLE SODIUM 40 MG PO TBEC
40.0000 mg | DELAYED_RELEASE_TABLET | Freq: Every day | ORAL | Status: DC
  Filled 2016-11-07: qty 1

## 2016-11-07 MED ORDER — VANCOMYCIN HCL 1000 MG IV SOLR
INTRAVENOUS | Status: DC | PRN
  Administered 2016-11-07: 1000 mL

## 2016-11-07 MED ORDER — MIDAZOLAM HCL 2 MG/2ML IJ SOLN
2.0000 mg | INTRAMUSCULAR | Status: DC | PRN
  Administered 2016-11-07 – 2016-11-10 (×7): 2 mg via INTRAVENOUS
  Filled 2016-11-07 (×9): qty 2

## 2016-11-07 MED ORDER — EPINEPHRINE PF 1 MG/ML IJ SOLN
INTRAVENOUS | Status: DC | PRN
  Administered 2016-11-07: 3 ug/min via INTRAVENOUS

## 2016-11-07 MED ORDER — ALBUMIN HUMAN 5 % IV SOLN
INTRAVENOUS | Status: DC | PRN
  Administered 2016-11-07: 12:00:00 via INTRAVENOUS

## 2016-11-07 MED ORDER — SODIUM CHLORIDE 0.45 % IV SOLN: INTRAVENOUS | Status: DC | PRN

## 2016-11-07 MED ORDER — SODIUM CHLORIDE 0.9 % IV SOLN
INTRAVENOUS | Status: DC
  Administered 2016-11-09: 20 mL via INTRAVENOUS

## 2016-11-07 MED ORDER — DEXTROSE 5 % IV SOLN
1.5000 g | Freq: Two times a day (BID) | INTRAVENOUS | Status: AC
  Administered 2016-11-07 – 2016-11-09 (×4): 1.5 g via INTRAVENOUS
  Filled 2016-11-07 (×4): qty 1.5

## 2016-11-07 MED ORDER — VANCOMYCIN HCL IN DEXTROSE 1-5 GM/200ML-% IV SOLN
1000.0000 mg | INTRAVENOUS | Status: DC
  Filled 2016-11-07: qty 200

## 2016-11-07 MED ORDER — SODIUM CHLORIDE 0.9 % IV SOLN
INTRAVENOUS | Status: DC | PRN
  Administered 2016-11-07: 11:00:00 via INTRAVENOUS

## 2016-11-07 MED ORDER — ASPIRIN EC 325 MG PO TBEC
325.0000 mg | DELAYED_RELEASE_TABLET | Freq: Every day | ORAL | Status: DC
  Administered 2016-11-10: 325 mg via ORAL
  Filled 2016-11-07 (×2): qty 1

## 2016-11-07 MED ORDER — SODIUM CHLORIDE 0.9 % IV SOLN: 250.0000 mL | INTRAVENOUS | Status: DC

## 2016-11-07 MED ORDER — METOCLOPRAMIDE HCL 5 MG/ML IJ SOLN
10.0000 mg | Freq: Four times a day (QID) | INTRAMUSCULAR | Status: AC
  Administered 2016-11-07 – 2016-11-11 (×15): 10 mg via INTRAVENOUS
  Filled 2016-11-07 (×13): qty 2

## 2016-11-07 MED ORDER — PHENYLEPHRINE HCL 10 MG/ML IJ SOLN
0.0000 ug/min | INTRAMUSCULAR | Status: DC
  Filled 2016-11-07 (×2): qty 2

## 2016-11-07 MED ORDER — ALBUMIN HUMAN 5 % IV SOLN: 250.0000 mL | INTRAVENOUS | Status: DC | PRN

## 2016-11-07 MED ORDER — OXYCODONE HCL 5 MG PO TABS: 5.0000 mg | ORAL_TABLET | ORAL | Status: DC | PRN

## 2016-11-07 MED ORDER — SUFENTANIL CITRATE 50 MCG/ML IV SOLN
INTRAVENOUS | Status: AC
  Filled 2016-11-07: qty 1

## 2016-11-07 MED ORDER — ARTIFICIAL TEARS OP OINT
TOPICAL_OINTMENT | OPHTHALMIC | Status: AC
  Filled 2016-11-07: qty 3.5

## 2016-11-07 MED ORDER — LACTATED RINGERS IV SOLN: 500.0000 mL | Freq: Once | INTRAVENOUS | Status: DC | PRN

## 2016-11-07 MED ORDER — DOCUSATE SODIUM 100 MG PO CAPS
200.0000 mg | ORAL_CAPSULE | Freq: Every day | ORAL | Status: DC
  Administered 2016-11-17: 200 mg via ORAL
  Filled 2016-11-07: qty 2

## 2016-11-07 MED ORDER — SODIUM CHLORIDE 0.9% FLUSH: 3.0000 mL | INTRAVENOUS | Status: DC | PRN

## 2016-11-07 MED ORDER — TRAMADOL HCL 50 MG PO TABS: 50.0000 mg | ORAL_TABLET | ORAL | Status: DC | PRN

## 2016-11-07 MED ORDER — MIDAZOLAM HCL 5 MG/5ML IJ SOLN
INTRAMUSCULAR | Status: DC | PRN
  Administered 2016-11-07: 2 mg via INTRAVENOUS

## 2016-11-07 MED ORDER — CHLORHEXIDINE GLUCONATE 0.12 % MT SOLN
15.0000 mL | OROMUCOSAL | Status: AC
  Administered 2016-11-07: 15 mL via OROMUCOSAL

## 2016-11-07 MED ORDER — PHENYLEPHRINE HCL 10 MG/ML IJ SOLN
0.0000 ug/min | INTRAVENOUS | Status: DC
  Administered 2016-11-07 – 2016-11-08 (×2): 30 ug/min via INTRAVENOUS
  Administered 2016-11-09: 25 ug/min via INTRAVENOUS
  Filled 2016-11-07 (×4): qty 4

## 2016-11-07 MED ORDER — LACTATED RINGERS IV SOLN
INTRAVENOUS | Status: DC
  Administered 2016-11-09: 20 mL via INTRAVENOUS

## 2016-11-07 MED ORDER — INSULIN REGULAR BOLUS VIA INFUSION
0.0000 [IU] | Freq: Three times a day (TID) | INTRAVENOUS | Status: DC
Start: 2016-11-07 — End: 2016-11-07
  Filled 2016-11-07: qty 10

## 2016-11-07 MED ORDER — DEXMEDETOMIDINE HCL IN NACL 200 MCG/50ML IV SOLN
INTRAVENOUS | Status: AC
  Filled 2016-11-07: qty 100

## 2016-11-07 MED ORDER — MORPHINE SULFATE (PF) 2 MG/ML IV SOLN
2.0000 mg | INTRAVENOUS | Status: DC | PRN
  Administered 2016-11-10 (×2): 2 mg via INTRAVENOUS
  Filled 2016-11-07 (×2): qty 1

## 2016-11-07 MED ORDER — VECURONIUM BROMIDE 10 MG IV SOLR
INTRAVENOUS | Status: AC
  Filled 2016-11-07: qty 10

## 2016-11-07 MED ORDER — SODIUM CHLORIDE 0.9% FLUSH
3.0000 mL | Freq: Two times a day (BID) | INTRAVENOUS | Status: DC
  Administered 2016-11-08 – 2016-11-10 (×3): 3 mL via INTRAVENOUS

## 2016-11-07 MED ORDER — VANCOMYCIN HCL 1000 MG IV SOLR
INTRAVENOUS | Status: AC
  Filled 2016-11-07: qty 1000

## 2016-11-07 MED ORDER — ORAL CARE MOUTH RINSE
15.0000 mL | OROMUCOSAL | Status: DC
  Administered 2016-11-07 – 2016-11-10 (×26): 15 mL via OROMUCOSAL

## 2016-11-07 MED ORDER — MORPHINE SULFATE (PF) 2 MG/ML IV SOLN: 1.0000 mg | INTRAVENOUS | Status: AC | PRN

## 2016-11-07 MED ORDER — DEXMEDETOMIDINE HCL IN NACL 200 MCG/50ML IV SOLN
0.0000 ug/kg/h | INTRAVENOUS | Status: DC
  Administered 2016-11-07 (×5): 1.2 ug/kg/h via INTRAVENOUS
  Filled 2016-11-07 (×4): qty 50

## 2016-11-07 MED ORDER — PROTAMINE SULFATE 10 MG/ML IV SOLN
INTRAVENOUS | Status: DC | PRN
  Administered 2016-11-07: 90 mg via INTRAVENOUS
  Administered 2016-11-07: 10 mg via INTRAVENOUS
  Administered 2016-11-07: 100 mg via INTRAVENOUS

## 2016-11-07 MED ORDER — HEPARIN SODIUM (PORCINE) 1000 UNIT/ML IJ SOLN
INTRAMUSCULAR | Status: AC
  Filled 2016-11-07: qty 1

## 2016-11-07 MED ORDER — CHLORHEXIDINE GLUCONATE 0.12% ORAL RINSE (MEDLINE KIT)
15.0000 mL | Freq: Two times a day (BID) | OROMUCOSAL | Status: DC
  Administered 2016-11-07 – 2016-11-10 (×6): 15 mL via OROMUCOSAL

## 2016-11-07 MED ORDER — HEPARIN SODIUM (PORCINE) 1000 UNIT/ML IJ SOLN
INTRAMUSCULAR | Status: DC | PRN
  Administered 2016-11-07: 18000 [IU] via INTRAVENOUS

## 2016-11-07 MED ORDER — ASPIRIN 81 MG PO CHEW
324.0000 mg | CHEWABLE_TABLET | Freq: Every day | ORAL | Status: DC
  Administered 2016-11-08 – 2016-11-09 (×2): 324 mg
  Filled 2016-11-07 (×2): qty 4

## 2016-11-07 MED ORDER — MIDAZOLAM HCL 2 MG/2ML IJ SOLN
INTRAMUSCULAR | Status: AC
  Filled 2016-11-07: qty 2

## 2016-11-07 MED ORDER — ONDANSETRON HCL 4 MG/2ML IJ SOLN
4.0000 mg | Freq: Four times a day (QID) | INTRAMUSCULAR | Status: DC | PRN
  Administered 2016-11-10 – 2016-11-11 (×2): 4 mg via INTRAVENOUS
  Filled 2016-11-07 (×3): qty 2

## 2016-11-07 MED ORDER — BISACODYL 10 MG RE SUPP
10.0000 mg | Freq: Every day | RECTAL | Status: DC
  Administered 2016-11-09: 10 mg via RECTAL
  Filled 2016-11-07: qty 1

## 2016-11-07 MED ORDER — METOPROLOL TARTRATE 12.5 MG HALF TABLET: 12.5000 mg | ORAL_TABLET | Freq: Two times a day (BID) | ORAL | Status: DC

## 2016-11-07 SURGICAL SUPPLY — 131 items
AGENT HMST KT MTR STRL THRMB (HEMOSTASIS) ×6
APL SKNCLS STERI-STRIP NONHPOA (GAUZE/BANDAGES/DRESSINGS)
ATTRACTOMAT 16X20 MAGNETIC DRP (DRAPES) ×3 IMPLANT
BAG DECANTER FOR FLEXI CONT (MISCELLANEOUS) ×6 IMPLANT
BANDAGE ELASTIC 4 VELCRO ST LF (GAUZE/BANDAGES/DRESSINGS) ×2 IMPLANT
BANDAGE ELASTIC 6 VELCRO ST LF (GAUZE/BANDAGES/DRESSINGS) ×2 IMPLANT
BENZOIN TINCTURE PRP APPL 2/3 (GAUZE/BANDAGES/DRESSINGS) IMPLANT
BLADE SAW SAG 29X58X.64 (BLADE) IMPLANT
BLADE SURG 11 STRL SS (BLADE) ×2 IMPLANT
BLADE SURG 12 STRL SS (BLADE) ×3 IMPLANT
BLADE SURG 15 STRL LF DISP TIS (BLADE) IMPLANT
BLADE SURG 15 STRL SS (BLADE) ×5
BNDG GAUZE ELAST 4 BULKY (GAUZE/BANDAGES/DRESSINGS) ×2 IMPLANT
CANISTER SUCTION 2500CC (MISCELLANEOUS) ×8 IMPLANT
CANNULA AORTIC HI-FLOW 6.5M20F (CANNULA) ×3 IMPLANT
CANNULA GUNDRY RCSP 15FR (MISCELLANEOUS) ×3 IMPLANT
CANNULA VENOUS MAL SGL STG 40 (MISCELLANEOUS) IMPLANT
CANNULAE VENOUS MAL SGL STG 40 (MISCELLANEOUS)
CATH CPB KIT VANTRIGT (MISCELLANEOUS) ×3 IMPLANT
CATH ROBINSON RED A/P 18FR (CATHETERS) ×9 IMPLANT
CATH THORACIC 28FR (CATHETERS) IMPLANT
CATH THORACIC 28FR RT ANG (CATHETERS) ×2 IMPLANT
CATH THORACIC 36FR (CATHETERS) IMPLANT
CATH THORACIC 36FR RT ANG (CATHETERS) ×6 IMPLANT
CLIP TI WIDE RED SMALL 24 (CLIP) IMPLANT
CONN 3/8X3/8 GISH STERILE (MISCELLANEOUS) ×4 IMPLANT
CONN ST 1/2X1/2  BEN (MISCELLANEOUS) ×4
CONN ST 1/2X1/2 BEN (MISCELLANEOUS) IMPLANT
CONN Y 3/8X3/8X3/8  BEN (MISCELLANEOUS)
CONN Y 3/8X3/8X3/8 BEN (MISCELLANEOUS) ×3 IMPLANT
CONT SPEC 4OZ CLIKSEAL STRL BL (MISCELLANEOUS) IMPLANT
COVER SURGICAL LIGHT HANDLE (MISCELLANEOUS) ×3 IMPLANT
CRADLE DONUT ADULT HEAD (MISCELLANEOUS) ×5 IMPLANT
DRAIN CHANNEL 19F RND (DRAIN) ×2 IMPLANT
DRAIN CHANNEL 32F RND 10.7 FF (WOUND CARE) ×3 IMPLANT
DRAPE CARDIOVASCULAR INCISE (DRAPES) ×5
DRAPE LAPAROSCOPIC ABDOMINAL (DRAPES) ×3 IMPLANT
DRAPE SLUSH/WARMER DISC (DRAPES) ×2 IMPLANT
DRAPE SRG 135X102X78XABS (DRAPES) ×3 IMPLANT
DRSG AQUACEL AG ADV 3.5X14 (GAUZE/BANDAGES/DRESSINGS) ×8 IMPLANT
DRSG PAD ABDOMINAL 8X10 ST (GAUZE/BANDAGES/DRESSINGS) IMPLANT
ELECT BLADE 4.0 EZ CLEAN MEGAD (MISCELLANEOUS) ×5
ELECT BLADE 6.5 EXT (BLADE) ×3 IMPLANT
ELECT CAUTERY BLADE 6.4 (BLADE) ×3 IMPLANT
ELECT REM PT RETURN 9FT ADLT (ELECTROSURGICAL) ×10
ELECTRODE BLDE 4.0 EZ CLN MEGD (MISCELLANEOUS) ×3 IMPLANT
ELECTRODE REM PT RTRN 9FT ADLT (ELECTROSURGICAL) ×9 IMPLANT
EVACUATOR SILICONE 100CC (DRAIN) ×2 IMPLANT
FELT TEFLON 1X6 (MISCELLANEOUS) ×2 IMPLANT
GAUZE SPONGE 4X4 12PLY STRL (GAUZE/BANDAGES/DRESSINGS) ×9 IMPLANT
GAUZE XEROFORM 5X9 LF (GAUZE/BANDAGES/DRESSINGS) ×2 IMPLANT
GLOVE BIO SURGEON STRL SZ7.5 (GLOVE) ×16 IMPLANT
GLOVE BIOGEL M 6.5 STRL (GLOVE) ×4 IMPLANT
GLOVE BIOGEL M 7.0 STRL (GLOVE) ×2 IMPLANT
GLOVE BIOGEL PI IND STRL 6 (GLOVE) IMPLANT
GLOVE BIOGEL PI IND STRL 7.0 (GLOVE) IMPLANT
GLOVE BIOGEL PI INDICATOR 6 (GLOVE) ×4
GLOVE BIOGEL PI INDICATOR 7.0 (GLOVE) ×8
GOWN STRL REUS W/ TWL LRG LVL3 (GOWN DISPOSABLE) ×24 IMPLANT
GOWN STRL REUS W/TWL LRG LVL3 (GOWN DISPOSABLE) ×30
HEMOSTAT POWDER SURGIFOAM 1G (HEMOSTASIS) ×9 IMPLANT
HEMOSTAT SURGICEL 2X14 (HEMOSTASIS) ×5 IMPLANT
INSERT FOGARTY XLG (MISCELLANEOUS) IMPLANT
KIT BASIN OR (CUSTOM PROCEDURE TRAY) ×8 IMPLANT
KIT REMOVER STAPLE SKIN (MISCELLANEOUS) ×2 IMPLANT
KIT ROOM TURNOVER OR (KITS) ×8 IMPLANT
KIT SUCTION CATH 14FR (SUCTIONS) ×5 IMPLANT
LEAD PACING MYOCARDI (MISCELLANEOUS) ×5 IMPLANT
NS IRRIG 1000ML POUR BTL (IV SOLUTION) ×30 IMPLANT
PACK CHEST (CUSTOM PROCEDURE TRAY) ×5 IMPLANT
PACK OPEN HEART (CUSTOM PROCEDURE TRAY) ×5 IMPLANT
PAD ARMBOARD 7.5X6 YLW CONV (MISCELLANEOUS) ×16 IMPLANT
PENCIL BUTTON HOLSTER BLD 10FT (ELECTRODE) ×3 IMPLANT
PIN SAFETY STERILE (MISCELLANEOUS) IMPLANT
RUBBERBAND STERILE (MISCELLANEOUS) IMPLANT
SET CARDIOPLEGIA MPS 5001102 (MISCELLANEOUS) ×2 IMPLANT
SPONGE GAUZE 4X4 12PLY STER LF (GAUZE/BANDAGES/DRESSINGS) ×4 IMPLANT
SPONGE LAP 18X18 X RAY DECT (DISPOSABLE) ×5 IMPLANT
STAPLER VISISTAT 35W (STAPLE) ×4 IMPLANT
STRAP MONTGOMERY 1.25X11-1/8 (MISCELLANEOUS) IMPLANT
SURGIFLO W/THROMBIN 8M KIT (HEMOSTASIS) ×4 IMPLANT
SUT BONE WAX W31G (SUTURE) ×3 IMPLANT
SUT ETHIBOND 2 0 SH (SUTURE) ×5
SUT ETHIBOND 2 0 SH 36X2 (SUTURE) IMPLANT
SUT ETHIBOND NAB MH 2-0 36IN (SUTURE) ×2 IMPLANT
SUT ETHILON 3 0 FSL (SUTURE) ×6 IMPLANT
SUT MNCRL AB 4-0 PS2 18 (SUTURE) IMPLANT
SUT PROLENE 3 0 SH DA (SUTURE) IMPLANT
SUT PROLENE 3 0 SH1 36 (SUTURE) ×4 IMPLANT
SUT PROLENE 4 0 RB 1 (SUTURE) ×5
SUT PROLENE 4 0 SH DA (SUTURE) ×7 IMPLANT
SUT PROLENE 4-0 RB1 .5 CRCL 36 (SUTURE) ×3 IMPLANT
SUT PROLENE 5 0 C 1 36 (SUTURE) IMPLANT
SUT PROLENE 6 0 C 1 30 (SUTURE) IMPLANT
SUT PROLENE 7 0 DA (SUTURE) IMPLANT
SUT PROLENE 8 0 BV175 6 (SUTURE) IMPLANT
SUT PROLENE BLUE 7 0 (SUTURE) ×3 IMPLANT
SUT SILK  1 MH (SUTURE) ×2
SUT SILK 1 MH (SUTURE) IMPLANT
SUT SILK 1 TIES 10X30 (SUTURE) ×2 IMPLANT
SUT SILK 2 0 SH CR/8 (SUTURE) ×4 IMPLANT
SUT SILK 3 0 SH CR/8 (SUTURE) ×2 IMPLANT
SUT STEEL 6MS V (SUTURE) ×10 IMPLANT
SUT STEEL STERNAL CCS#1 18IN (SUTURE) IMPLANT
SUT STEEL SZ 6 DBL 3X14 BALL (SUTURE) ×5 IMPLANT
SUT STERNA BAND STERNOTOMY (SUTURE) IMPLANT
SUT VIC AB 1 CTX 18 (SUTURE) ×4 IMPLANT
SUT VIC AB 1 CTX 36 (SUTURE) ×15
SUT VIC AB 1 CTX36XBRD ANBCTR (SUTURE) ×6 IMPLANT
SUT VIC AB 2-0 CT1 27 (SUTURE)
SUT VIC AB 2-0 CT1 TAPERPNT 27 (SUTURE) IMPLANT
SUT VIC AB 2-0 CTX 27 (SUTURE) ×8 IMPLANT
SUT VIC AB 3-0 SH 27 (SUTURE)
SUT VIC AB 3-0 SH 27X BRD (SUTURE) IMPLANT
SUT VIC AB 3-0 SH 8-18 (SUTURE) ×2 IMPLANT
SUT VIC AB 3-0 X1 27 (SUTURE) IMPLANT
SUT VICRYL 3 0 (SUTURE) ×4 IMPLANT
SUT VICRYL 4-0 PS2 18IN ABS (SUTURE) IMPLANT
SUTURE E-PAK OPEN HEART (SUTURE) ×3 IMPLANT
SWAB COLLECTION DEVICE MRSA (MISCELLANEOUS) IMPLANT
SYSTEM SAHARA CHEST DRAIN ATS (WOUND CARE) ×10 IMPLANT
TAPE CLOTH SURG 4X10 WHT LF (GAUZE/BANDAGES/DRESSINGS) ×2 IMPLANT
TOWEL OR 17X24 6PK STRL BLUE (TOWEL DISPOSABLE) ×15 IMPLANT
TOWEL OR 17X26 10 PK STRL BLUE (TOWEL DISPOSABLE) ×11 IMPLANT
TRAY FOLEY IC TEMP SENS 14FR (CATHETERS) ×3 IMPLANT
TUBE ANAEROBIC SPECIMEN COL (MISCELLANEOUS) IMPLANT
TUBE CONNECTING 12'X1/4 (SUCTIONS) ×1
TUBE CONNECTING 12X1/4 (SUCTIONS) ×1 IMPLANT
TUBE SUCT INTRACARD DLP 20F (MISCELLANEOUS) ×2 IMPLANT
UNDERPAD 30X30 (UNDERPADS AND DIAPERS) ×5 IMPLANT
WATER STERILE IRR 1000ML POUR (IV SOLUTION) ×13 IMPLANT

## 2016-11-07 NOTE — Progress Notes (Signed)
The patient was examined and preop studies reviewed. There has been no change from the prior exam and the patient is ready for surgery.  Plan sternal washout, wean of BIVAD and possible decannulation today Discussed procedure risks and benefits with patients mother and informed consent obtained HIT screen negative

## 2016-11-07 NOTE — OR Nursing (Signed)
1059 Second call made to SICU.

## 2016-11-07 NOTE — OR Nursing (Signed)
1148 SICU given update.

## 2016-11-07 NOTE — Op Note (Signed)
NAMKinnie Feil:  Sult, Layni                ACCOUNT NO.:  [id number]  MEDICAL RECORD NO.:  [id number]  LOCATION:  [id number]                        FACILITY:  [facility]  PHYSICIAN:  Kerin PernaPeter Van Trigt, M.D.  DATE OF BIRTH:  1976-02-22  DATE OF PROCEDURE:  11/07/2016 DATE OF DISCHARGE:                              OPERATIVE REPORT   OPERATION: 1. Removal of biventricular assist device pumps and closure of chest. 2. Evacuation of left thigh hematoma.  SURGEON:  Kerin PernaPeter Van Trigt, M.D.  PREOPERATIVE DIAGNOSIS:  Status post emergency redo CABG, placement of biventricular CentriMag pump assist device, cardiogenic shock.  ANESTHESIA:  General by Dr. Kipp Broodavid Joslin.  DESCRIPTION OF PROCEDURE:  After discussing the procedure in detail including benefits and risks with the family, informed consent was obtained, and the patient was transferred from the ICU directly to the OR on biventricular assist device, mechanical ventilation, and inhaled nitric oxide therapy.  The patient was placed supine on the operating table and remained stable.  A transesophageal echo probe was placed by the anesthesiologist.  General anesthesia was induced.  The patient remained stable.  The patient was prepped and draped as a sterile field. A proper time-out was performed.  A transesophageal echo showed good biventricular function with ejection from the left ventricle despite LVAD flows of 5 L/minute.  The previous wound VAC and Esmarch dressing on the open chest were removed.  There was a moderate amount of clotted and non-clotted blood in the anterior mediastinum.  This was carefully irrigated and removed. A sternal retractor was placed.  Further dissection of the mediastinum and removal of blood clots were performed.  The sternal retractor was then placed for full visualization of the mediastinum.  The ACT was 220 baseline.  The patient had been on bivalve en route until entering the operating room.  The patient was given a  calculated loading dose of heparin for possible cardiopulmonary bypass.  Heparin was administered, and the ACT was documented as being greater than 400. At this point, flows on the RVAD initially and then followed by flows on the LVAD were sequentially reduced down to 1 L/minute.  The patient remained stable and echocardiogram demonstrated adequate biventricular function.  Pump lines from the cardiopulmonary bypass circuit were then placed on the operative field in a sterile fashion.  Both LVAD and RVAD circuits were clamped.  The patient was placed on cardiopulmonary bypass by disconnecting the aortic inflow line and the right atrial outflow line to the cardiopulmonary bypass circuit.  Cardiopulmonary bypass was initiated with the ventilator still operating at a reduced rate of 4 breaths per minute, with inhaled nitric oxide.  CO2 was insufflated into the operative field.  After putting the patient on cardiopulmonary bypass, the heart decompressed.  At this point, the cannula to the RV outflow tract was removed and the pursestrings were carefully tied and reinforced with a pledgeted 4-0 Prolene.  Next, the apex of the heart was gently visualized and the pursestrings were loosened for the LV apical cannula.  This was then removed and the pursestrings were tied.  An initial pledgeted 2-0 Ethibond with an MH needle was placed in the LV apex to provide adequate hemostasis.  The 2 remaining cannulas were then used to wean from cardiopulmonary bypass in the usual fashion.  The patient remained stable with good biventricular function on echo.  The right atrial cannula was removed and the pursestrings tied.  This was reinforced with a pledgeted 4-0 Prolene.  Protamine was then administered without adverse reaction. After the protamine had been completely dosed, the aortic cannula was removed and the pursestrings were tied with good hemostasis.  The patient was completely off mechanical  circulatory support and observed for several minutes.  The chest was irrigated with warm vancomycin irrigation.  The pacing wires were intact.  Bilateral pleural tubes and anterior and posterior mediastinal tubes were placed and secured.  The superior pericardial fat was closed over the aorta.  It should be noted that during the inspection of the heart, both saphenous vein grafts were noted to be patent.  The sternum was closed with wire.  The patient remained hemodynamically stable.  The pectoralis fascia was closed with a running #1 Vicryl.  The subcutaneous layer was closed with a running 2-0 Vicryl.  The skin was closed with staples.  The entry incisions of the percutaneous LVAD cannulas were closed in layers using interrupted 3-0 Vicryl and skin sutures of 3-0 nylon.  Next, the left thigh hematoma was explored after removing the skin staples and opening the subcuticular running suture.  There was a large clotted hematoma of several hundred milliliters removed.  The wound was irrigated with vancomycin irrigation.  Hemostasis was obtained.  A 19- French round JP drain was placed beneath the sutures and connected to a bulb suction, brought out through a separate incision.  The incision was then closed in layers using Vicryl and then skin staples.  The patient remained stable.  Sterile dressings were applied and the patient was transferred back to the ICU in critical, but stable condition.     Kerin PernaPeter Van Trigt, M.D.     PV/MEDQ  D:  11/07/2016  T:  11/07/2016  Job:  [id number]  cc:   Hochron, Dr.

## 2016-11-07 NOTE — Progress Notes (Signed)
Advanced Heart Failure Rounding Note   Subjective:    40 y/o woman with HTN, CAD and tobacco use. Underwent stenting of LCX in 1/17. Readmitted for NSTEMI. Found to have 2v CAD with high-grade ostial LAD and LCX stensosis. Underwent CABG with Dr. Cornelius Moras with LIMA to LAD, SVG to DIAG and SVG to LCX. Developed VF arrest and had re-do sternotomy begun at bedside with cardia massage. 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 Underwent re-do CABG x1 with SVG to OM and placement of biVAD support due to shock.   Went to OR today for explantation of bivads and chest closure.   Did well. Now on epi 2, milrinone 0.3, NO 16 ppm Intubated on 40% FiO2    CI = 2.5  CVP 14  Objective:   Weight Range:  Vital Signs:   Temp:  [97.3 F (36.3 C)-100.4 F (38 C)] 100.4 F (38 C) (11/24 1530) Pulse Rate:  [76-110] 106 (11/24 1530) Resp:  [0-33] 33 (11/24 1530) BP: (97-107)/(65-78) 107/78 (11/24 1500) SpO2:  [100 %] 100 % (11/24 1530) Arterial Line BP: (69-136)/(43-89) 77/43 (11/24 1530) FiO2 (%):  [40 %] 40 % (11/24 1223) Weight:  [80.3 kg (177 lb 0.5 oz)] 80.3 kg (177 lb 0.5 oz) (11/24 0430) Last BM Date:  (PTA)  Weight change: Filed Weights   11/05/16 0500 11/06/16 0443 11/07/16 0430  Weight: 84.1 kg (185 lb 6.5 oz) 83.5 kg (184 lb 1.4 oz) 80.3 kg (177 lb 0.5 oz)    Intake/Output:   Intake/Output Summary (Last 24 hours) at 11/07/16 1704 Last data filed at 11/07/16 1400  Gross per 24 hour  Intake          5458.42 ml  Output             4030 ml  Net          1428.42 ml     Physical Exam: General:  Intubated. Sedated edematous  HEENT: normal ETT Neck: supple.RIJ swan Cor: Chest closed. Dressing in place Lungs: coarse Abdomen: soft, nontender, +distended. quiet Extremities: warm 3+ edema. L groin sutured with drain Neuro: intubated sedated   Telemetry: Sinus tach 100-110s (viewed personally)  Labs: Basic Metabolic Panel:  Recent Labs Lab 11/03/16 0406  11/04/16 0300   11/04/16 1700 11/05/16 0215  11/06/16 0400  11/07/16 0400 11/07/16 0830 11/07/16 0909 11/07/16 0943 11/07/16 1017  NA 140  < > 144  < >  --  139  < > 138  < > 139 140 140 138 137  K 3.4*  < > 3.7  < >  --  3.9  < > 3.6  < > 3.5 3.5 3.3* 4.1 3.3*  CL 107  < > 106  < >  --  107  < > 107  < > 105 100* 99* 98* 99*  CO2 25  --  28  --   --  28  --  26  --  25  --   --   --   --   GLUCOSE 106*  < > 149*  < >  --  103*  < > 109*  < > 94 105* 130* 172* 170*  BUN 5*  < > 10  < >  --  14  < > 9  < > 8 7 6  5* 6  CREATININE 0.79  < > 1.02*  < > 1.00 0.82  < > 0.87  < > 0.81 0.60 0.60 0.60 0.60  CALCIUM 8.8*  --  6.6*  --   --  6.6*  --  7.7*  --  8.3*  --   --   --   --   MG  --   --  2.9*  --  2.2  --   --   --   --   --   --   --   --   --   < > = values in this interval not displayed.  Liver Function Tests:  Recent Labs Lab 11/02/16 0037 11/05/16 0215 11/06/16 0400 11/07/16 0400  AST 18 128* 47* 28  ALT 16 70* 42 27  ALKPHOS 103 39 38 42  BILITOT 0.5 1.0 1.5* 1.3*  PROT 6.7 4.1* 4.7* 5.0*  ALBUMIN 3.7 2.6* 3.2* 3.1*   No results for input(s): LIPASE, AMYLASE in the last 168 hours. No results for input(s): AMMONIA in the last 168 hours.  CBC:  Recent Labs Lab 11/05/16 0215 11/05/16 1605  11/06/16 0400 11/06/16 1600 11/07/16 0400 11/07/16 0830 11/07/16 0909 11/07/16 0943 11/07/16 1017  WBC 13.4* 8.4  --  7.4 6.7 5.7  --   --   --   --   HGB 10.6* 7.0*  6.8*  < > 8.5* 8.7*  8.2* 9.6* 9.2* 9.9* 8.2* 8.8*  HCT 30.4* 19.7*  20.0*  < > 24.3* 25.2*  24.0* 27.5* 27.0* 29.0* 24.0* 26.0*  MCV 84.2 84.2  --  82.7 82.4 83.3  --   --   --   --   PLT 81* 63*  --  83* 72* 72*  --   --   --   --   < > = values in this interval not displayed.  Cardiac Enzymes:  Recent Labs Lab 11/01/16 1150 11/02/16 0827  TROPONINI 0.04* <0.03    BNP: BNP (last 3 results) No results for input(s): BNP in the last 8760 hours.  ProBNP (last 3 results) No results for input(s): PROBNP in  the last 8760 hours.    Other results:  Imaging: Dg Chest 1 View  Result Date: 11/06/2016 CLINICAL DATA:  Postop heart surgery.  Endotracheal tube. EXAM: CHEST 1 VIEW COMPARISON:  11/05/2016; 10/30/2016; 06/09/2015 FINDINGS: Grossly unchanged cardiac silhouette and mediastinal contours. Stable position of support apparatus including malpositioned left subclavian vein approach central venous catheter which courses cranially likely within the left internal jugular vein, tip excluded from view. Re- demonstrated chest tube, mediastinal drains and ventricular assist devices. Improved aeration of the lungs with persistent perihilar opacities, likely atelectasis. No new focal airspace opacities. No pleural effusion or pneumothorax. No acute osseus abnormalities. IMPRESSION: 1. Stable positioning of support apparatus including malpositioned left subclavian vein approach central venous catheter which courses cranially, likely within the left internal jugular vein, tip excluded from view. No pneumothorax. 2. Improved aeration along suggests improved edema and/or atelectasis. No new focal airspace opacities. Electronically Signed   By: Simonne ComeJohn  Watts M.D.   On: 11/06/2016 10:07   Dg Chest Portable 1 View  Result Date: 11/07/2016 CLINICAL DATA:  Status post removal of ventricular assist device EXAM: PORTABLE CHEST 1 VIEW COMPARISON:  11/07/2016 FINDINGS: Cardiac shadow is stable. Endotracheal tube is again identified in stable position. Swan-Ganz catheter is noted in the right pulmonary artery. There is been interval removal of the ventricular assist device. Nasogastric catheter is noted within the stomach although the tip lies in the distal esophagus coiled upon itself. Bilateral thoracostomy catheters as well as a pericardial drain are seen. Postsurgical changes with median sternotomy wires and  surgical staples are seen. Vascular congestion is noted in part due to a poor inspiratory effort. No focal infiltrate or  pneumothorax is seen. Left subclavian line is again identified coursing into the left jugular vein and stable. IMPRESSION: Interval removal of ventricular assist device. Tubes and lines as described above. The nasogastric catheter is coiled upon itself in the stomach with the tip lying in the distal esophagus. Vascular congestion in part due to poor inspiratory effort. Electronically Signed   By: Alcide CleverMark  Lukens M.D.   On: 11/07/2016 12:06   Dg Chest Port 1 View  Result Date: 11/07/2016 CLINICAL DATA:  CABG. EXAM: PORTABLE CHEST 1 VIEW COMPARISON:  11/06/2016. FINDINGS: Left subclavian central line malposition again noted with the course of the catheter projecting caudally over the left internal jugular vein. Endotracheal tube, NG tube, Swan-Ganz catheter mediastinal drainage catheter, bilateral chest tubes, cardiac tubing in stable position. Heart size stable. Left perihilar surgical clips are noted. Low lung volumes with mild basilar atelectasis. No pleural effusion or pneumothorax. IMPRESSION: 1. Lines and tubes in stable position. Left subclavian central line malposition again noted with the course of the catheter projecting caudally over the left internal vein. No pneumothorax. 2. Low lung volumes with mild bibasilar atelectasis. Heart size normal. No pulmonary venous congestion. Electronically Signed   By: Maisie Fushomas  Register   On: 11/07/2016 07:18     Medications:     Scheduled Medications: . [START ON 11/08/2016] acetaminophen  1,000 mg Oral Q6H   Or  . [START ON 11/08/2016] acetaminophen (TYLENOL) oral liquid 160 mg/5 mL  1,000 mg Per Tube Q6H  . [START ON 11/08/2016] aspirin EC  325 mg Oral Daily   Or  . [START ON 11/08/2016] aspirin  324 mg Per Tube Daily  . [START ON 11/08/2016] bisacodyl  10 mg Oral Daily   Or  . [START ON 11/08/2016] bisacodyl  10 mg Rectal Daily  . cefUROXime (ZINACEF)  IV  1.5 g Intravenous Q12H  . chlorhexidine  15 mL Mouth/Throat NOW  . [START ON 11/08/2016]  docusate sodium  200 mg Oral Daily  . famotidine (PEPCID) IV  20 mg Intravenous Q12H  . insulin aspart  0-24 Units Subcutaneous Q4H  . magnesium sulfate  4 g Intravenous Once  . metoCLOPramide (REGLAN) injection  10 mg Intravenous Q6H  . metoprolol tartrate  12.5 mg Oral BID   Or  . metoprolol tartrate  12.5 mg Per Tube BID  . [START ON 11/09/2016] pantoprazole  40 mg Oral Daily  . [START ON 11/08/2016] sodium chloride flush  3 mL Intravenous Q12H  . vancomycin  1,000 mg Intravenous Q12H    Infusions: . sodium chloride    . [START ON 11/08/2016] sodium chloride    . sodium chloride 20 mL/hr at 11/07/16 1215  . dexmedetomidine 1.2 mcg/kg/hr (11/07/16 1626)  . EPINEPHrine 4 mg in dextrose 5% 250 mL infusion (16 mcg/mL) 2 mcg/min (11/07/16 1400)  . fentaNYL infusion INTRAVENOUS    . lactated ringers 20 mL (11/07/16 1215)  . lactated ringers    . milrinone 0.3 mcg/kg/min (11/07/16 1215)  . nitroGLYCERIN    . norepinephrine (LEVOPHED) Adult infusion    . phenylephrine (NEO-SYNEPHRINE) Adult infusion 25 mcg/min (11/07/16 1400)    PRN Medications: sodium chloride, albumin human, lactated ringers, metoprolol, midazolam, morphine injection, morphine injection, ondansetron (ZOFRAN) IV, oxyCODONE, [START ON 11/08/2016] sodium chloride flush, traMADol   Assessment:   1. Cardiogenic shock with acute biventricular failure    --s/p removal of bivads  11/24 2. VF arrest 3. CAD s/p CABG with emergent re-do 4. NSTEMI 5. Acute respiratory failure 6. Acute blood loss anemia 7. Hypokalemia 8. Left groin hematoma 8. 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 Inter-op TEE reviewed LVEF likely 40-45% range RV function remains poor.    She is doing well on low-dose inotropes. She is markedly volume overloaded. Continue pressor support and NO overnight. Wean NO tomorrow. Diurese as tolerated. (she is 40 pounds over pre-op weight)  We will  continually to follow closely.   The patient is critically ill with multiple organ systems failure and requires high complexity decision making for assessment and support, frequent evaluation and titration of therapies, application of advanced monitoring technologies and extensive interpretation of multiple databases.   Critical Care Time devoted to patient care services described in this note is 35 Minutes.   Length of Stay: 7   Arvilla Meres MD 11/07/2016, 5:04 PM  Advanced Heart Failure Team Pager [phone] (M-F; 7a - 4p)  Please contact [facility] for night-coverage after hours (4p -7a ) and weekends on [email or web address]

## 2016-11-07 NOTE — Transfer of Care (Signed)
Immediate Anesthesia Transfer of Care Note  Patient: Christina Morrison  Procedure(s) Performed: Procedure(s): STERNAL WASHOUT WITH STERNAL CLOSURE (N/A) TRANSESOPHAGEAL ECHOCARDIOGRAM (TEE) (N/A) REMOVAL OF CENTRIMAG VENTRICULAR ASSIST DEVICE (N/A) EVACUATION HEMATOMA (Left)  Patient Location: ICU  Anesthesia Type:General  Level of Consciousness: unresponsive and Patient remains intubated per anesthesia plan  Airway & Oxygen Therapy: Patient remains intubated per anesthesia plan and Patient placed on Ventilator (see vital sign flow sheet for setting)  Post-op Assessment: Report given to RN and Post -op Vital signs reviewed and stable  Post vital signs: Reviewed and stable  Last Vitals:  Vitals:   11/07/16 0715 11/07/16 0730  BP:    Pulse: 77 76  Resp: 15 14  Temp: 36.4 C 36.3 C    Last Pain:  Vitals:   11/07/16 0700  TempSrc: Core (Comment)  PainSc:       Patients Stated Pain Goal: 0 (11/02/16 2000)  Complications: No apparent anesthesia complications

## 2016-11-07 NOTE — OR Nursing (Signed)
1204 Rolling call made to SICU.

## 2016-11-07 NOTE — OR Nursing (Signed)
Verified during count that all 8 keepers that had previously been left in pt's chest have been removed.

## 2016-11-07 NOTE — OR Nursing (Signed)
[id number] SICU made aware per [name], RN that pt is off centrimag.

## 2016-11-07 NOTE — Progress Notes (Signed)
Patient ID: Evalyn CascoKelly Tribby, female   DOB: 26-Jun-1976, 40 y.o.   MRN: [id number]   SICU Evening Rounds:   Hemodynamically stable  CI = 2.5 on epi 2, milrinone 0.3, NO 16 ppm  CVP 11 PA 36/18  Sedated on vent  Urine output good  CT output low  CBC    Component Value Date/Time   WBC 5.7 11/07/2016 0400   RBC 3.30 (L) 11/07/2016 0400   HGB 8.8 (L) 11/07/2016 1017   HCT 26.0 (L) 11/07/2016 1017   PLT 72 (L) 11/07/2016 0400   MCV 83.3 11/07/2016 0400   MCH 29.1 11/07/2016 0400   MCHC 34.9 11/07/2016 0400   RDW 14.7 11/07/2016 0400   LYMPHSABS 1.5 10/30/2016 2000   MONOABS 0.4 10/30/2016 2000   EOSABS 0.1 10/30/2016 2000   BASOSABS 0.0 10/30/2016 2000     BMET    Component Value Date/Time   NA 137 11/07/2016 1017   K 3.3 (L) 11/07/2016 1017   CL 99 (L) 11/07/2016 1017   CO2 25 11/07/2016 0400   GLUCOSE 170 (H) 11/07/2016 1017   BUN 6 11/07/2016 1017   CREATININE 0.60 11/07/2016 1017   CREATININE 0.79 12/20/2015 1218   CALCIUM 8.3 (L) 11/07/2016 0400   GFRNONAA >60 11/07/2016 0400   GFRNONAA >89 01/24/2015 1537   GFRAA >60 11/07/2016 0400   GFRAA >89 01/24/2015 1537     A/P:  Stable postop course. Continue current plans

## 2016-11-07 NOTE — Brief Op Note (Signed)
10/30/2016 - 11/07/2016  11:47 AM  PATIENT:  Christina CascoKelly Garlock  40 y.o. female  PRE-OPERATIVE DIAGNOSIS:  OPEN CHEST S/P CABG  POST-OPERATIVE DIAGNOSIS:  OPEN CHEST S/P CABG, biventricular assist device support  PROCEDURE:  Procedure(s): STERNAL WASHOUT WITH STERNAL CLOSURE (N/A) TRANSESOPHAGEAL ECHOCARDIOGRAM (TEE) (N/A) REMOVAL OF CENTRIMAG VENTRICULAR ASSIST DEVICE (N/A) EVACUATION HEMATOMA (Left)  leg  SURGEON:  Surgeon(s) and Role: Panel 1:    * Kerin PernaPeter Van Trigt, MD - Primary  Panel 2:    * Kerin PernaPeter Van Trigt, MD - Primary  PHYSICIAN ASSISTANT:   ASSISTANTS: none   ANESTHESIA:   general  EBL:  75 ml   BLOOD ADMINISTERED:2 PRBC 1 plt 2 FFP  DRAINS: Penrose drain in the left leg   LOCAL MEDICATIONS USED:  NONE  SPECIMEN:  No Specimen  DISPOSITION OF SPECIMEN:  N/A  COUNTS:  YES  TOURNIQUET:  * No tourniquets in log *  DICTATION: .Dragon Dictation  PLAN OF CARE: return to ICU bed  PATIENT DISPOSITION:  ICU - intubated and hemodynamically stable.   Delay start of Pharmacological VTE agent (>24hrs) due to surgical blood loss or risk of bleeding: yes

## 2016-11-07 NOTE — Anesthesia Preprocedure Evaluation (Addendum)
Anesthesia Evaluation  Patient identified by MRN, date of birth, ID band Patient awake    Reviewed: Allergy & Precautions, NPO status , Patient's Chart, lab work & pertinent test results  Airway Mallampati: Intubated       Dental   Pulmonary Current Smoker,    breath sounds clear to auscultation       Cardiovascular  Rhythm:Regular Rate:Normal     Neuro/Psych    GI/Hepatic   Endo/Other    Renal/GU      Musculoskeletal   Abdominal   Peds  Hematology   Anesthesia Other Findings Vac over sternotomy incision, responds to painful stimuli.  Reproductive/Obstetrics                             Anesthesia Physical Anesthesia Plan  ASA: IV  Anesthesia Plan: General   Post-op Pain Management:    Induction: Intravenous  Airway Management Planned: Oral ETT  Additional Equipment: Arterial line, CVP, PA Cath and 3D TEE  Intra-op Plan:   Post-operative Plan: Post-operative intubation/ventilation  Informed Consent: I have reviewed the patients History and Physical, chart, labs and discussed the procedure including the risks, benefits and alternatives for the proposed anesthesia with the patient or authorized representative who has indicated his/her understanding and acceptance.     Plan Discussed with: CRNA and Anesthesiologist  Anesthesia Plan Comments:         Anesthesia Quick Evaluation

## 2016-11-07 NOTE — CV Procedure (Signed)
Intra-operative Transesophageal Echocardiography Report:  Christina Morrison is a 40 year old female with a history of coronary artery disease and cocaine abuse who underwent previous coronary stenting in April, 2016 and January, 2017. He was readmitted on 10/30/2016 with symptoms of unstable angina. She was found to have 70% ostial stenosis of the LAD and 70% ostial in-stent restenosis of the left circumflex. There was no significant disease in the right coronary artery territory. He underwent coronary artery bypass grafting3  By Dr. Cornelius Moraswen on 11/03/2016. Following surgery she suffered a cardiac arrest in the SICU and the chest was opened in the ICU and direct cardiac massage was administered. She was then rushed to the operating room and underwent CABG 1 with  placement Centrimag right and left ventricular assist devices.  She has been neurologically intact and hemodynamically stable on BiVAD support and is now brought to the operating room for BIVAD explantation, sternal washout and chest closure.  [facility] and general anesthesia was induced without difficulty. The transesophageal echocardiography probe was inserted into the esophagus following orogastric suctioning.  Impression: Pre-bypass findings:  1. Aortic valve: The aortic valve was trileaflet. The leaflets were thin and pliable and opened normally with without restriction. There was trace aortic insufficiency.  2. Mitral valve: The mitral leaflets appeared to open normally. There was trace to 1+ mitral insufficiency. There appeared to be normal coaptation of the leaflets.  3. Left ventricle: There appeared to be adequate contractility of the left ventricle. There were no regional wall motion abnormalities appreciated. The ejection fraction was estimated 50-55%.  4. Right ventricle: Right ventricular cavity was of normal size. There appeared to be some decreased contractility the right  ventricular free wall but overall adequate right ventricular function.  5. Tricuspid valve: The tricuspid leaflets appeared structurally normal. There was trace tricuspid insufficiency.  6. Interatrial septum: Interatrial septum was intact without evidence of patent foramen ovale by color Doppler.  7. Left atrium: There was no thrombus noted within the left atrium or left atrial appendage.  8. Ascending aorta: The ascending aorta was of normal caliber with a well-defined aortic root and sinotubular ridge. The LVAD outflow cannula could be visualized with mildly turbulent flow within the asceinding aorta in the vicinity of the outflow cannula.  9. Descending aorta the descending aorta was of normal caliber and measured 1.7 cm. There was no atheromatous disease noted in the descending aorta.  Post-cannulation:  1. Aortic valve: Aortic valve was unchanged from the pre-cannulation study. There was trace aortic insufficiency but the aortic valve otherwise appeared normal.   2. Mitral valve: The mitral leaflets coapted normally and there was trace mitral insufficiency.  3. Left ventricle: There appeared to be adequate left ventricular contractility. The ejection fraction was estimated at 50-55% with no obvious regional wall motion abnormalities.  4. Right ventricle: The right ventricular cavity was not enlarged following explantation. There was reduced contractility of the right ventricular free wall but adequate RV function. Following sternotomy closure, there appeared to be some degree of extrinsic compression of the right ventricular free wall with decreased filling of the right ventricle.  5. Tricuspid valve: The tricuspid valve appeared structurally normal with trace tricuspid insufficiency.  Christina Broodavid Christina Morrison

## 2016-11-07 NOTE — OR Nursing (Signed)
1018 SICU given first call.

## 2016-11-07 NOTE — Progress Notes (Signed)
[place] for bivalirudin Indication: BiVAD  Allergies  Allergen Reactions  . Ranexa [Ranolazine] Nausea Only    Dizziness (only the 1,000 mg dose has this effect)    Patient Measurements: Height: '5\' 2"'$  (157.5 cm) Weight: 177 lb 0.5 oz (80.3 kg) IBW/kg (Calculated) : 50.1   Vital Signs: Temp: 99 F (37.2 C) (11/24 0500) Temp Source: Core (Comment) (11/24 0500) BP: 102/69 (11/24 0312) Pulse Rate: 82 (11/24 0500)  Labs:  Recent Labs  11/05/16 0215  11/06/16 0400 11/06/16 1600 11/06/16 1605 11/07/16 0400  HGB 10.6*  < > 8.5* 8.7*  8.2*  --  9.6*  HCT 30.4*  < > 24.3* 25.2*  24.0*  --  27.5*  PLT 81*  < > 83* 72*  --  72*  APTT 135*  < > 44*  --  73* 66*  LABPROT 17.4*  --  18.6*  --   --  17.6*  INR 1.41  --  1.53  --   --  1.43  HEPARINUNFRC 0.29*  --   --   --   --   --   CREATININE 0.82  < > 0.87 0.70  --  0.81  < > = values in this interval not displayed.  Estimated Creatinine Clearance: 90.7 mL/min (by C-G formula based on SCr of 0.81 mg/dL).   Medical History: Past Medical History:  Diagnosis Date  . Anxiety   . Bipolar 1 disorder ([place])   . Cardiac arrest ([place]) 11/03/2016  . Cardiogenic shock ([place]) 11/03/2016  . Chronic pain   . Cocaine use   . Coronary artery disease    a. 03/2015 NSTEMI/PCI in setting of cocaine use - BMS to diagonal. b. NSTEMI 12/2015 s/o overlapping DES to Cx, residual mild RCA and LAD disease, 75% OM1.  . Depression   . Dyslipidemia   . Migraine   . Osteoarthritis   . Polysubstance abuse    a. tobacco/cocaine  . 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 Scoliosis   . TIA (transient ischemic attack)     Medications:  Scheduled:  . sodium chloride   Intravenous Once  . cefUROXime (ZINACEF)  IV  1.5 g Intravenous Q12H  . chlorhexidine gluconate (MEDLINE KIT)  15 mL Mouth Rinse BID  . famotidine (PEPCID) IV  20 mg Intravenous Q12H  .  furosemide  20 mg Intravenous Q6H  . insulin regular  0-10 Units Intravenous TID WC  . mouth rinse  15 mL Mouth Rinse 10 times per day  . mupirocin ointment  1 application Topical BID  . potassium chloride (KCL MULTIRUN) 30 mEq in 265 mL IVPB  30 mEq Intravenous Once  . sodium chloride flush  10-40 mL Intracatheter Q12H  . sodium chloride flush  3 mL Intravenous Q12H  . vancomycin  1,000 mg Intravenous Q12H   Infusions:  . sodium chloride Stopped (11/05/16 0800)  . sodium chloride    . sodium chloride 20 mL/hr at 11/06/16 0900  . bivalirudin (ANGIOMAX) infusion 0.5 mg/mL (Non-ACS indications) 0.06 mg/kg/hr (11/07/16 0500)  . dexmedetomidine 1.2 mcg/kg/hr (11/07/16 0500)  . fentaNYL infusion INTRAVENOUS 325 mcg/hr (11/07/16 0500)  . insulin (NOVOLIN-R) infusion Stopped (11/06/16 0800)  . lactated ringers 20 mL/hr at 11/06/16 1800  . lactated ringers 20 mL/hr at 11/06/16 1800  . midazolam (VERSED) infusion 3 mg/hr (11/07/16 0500)  . milrinone 0.3 mcg/kg/min (11/07/16 0500)  .  norepinephrine (LEVOPHED) Adult infusion    . phenylephrine (NEO-SYNEPHRINE) Adult infusion 15 mcg/min (11/07/16 0500)  . vasopressin (PITRESSIN) infusion - *FOR SHOCK*      Assessment: 40 yo female s/p CABG on 11/20 then with VF arrest, re-do sternotomy, and re-do CABG x1 with placement of biVAD support. Pharmacy asked to dose a direct thrombin inhibitor for r/o HIT. No bival increases per MD request. Checking 0500/1700 aPTTs.  -heparin initially started 11/17 with platelet count= 249). 11/23: platelet count= 72 -presurgery platelet count= 167 (11/20) -Hg today 9.6, plt 72  aPTT therapeutic at 66. HIT antibody = 0.138, SRA pending. No S/Sx bleeding documented, plans for OR today. Per RN, Dr. Cyndia Bent discussed with Dr. Prescott Gum and the bivalirudin was increased to 0.06 mg/kg/hr, but we will not go any higher than this.   Goal of Therapy:  APTT goal 60-80 per Dr. Lawson Fiscal Monitor platelets by anticoagulation  protocol: Yes   Plan:  -Continue bivalirudin at 0.'06mg'$ /kg/hr -aPTT at 0500 and 1700 per MD -CBC daily -Follow-up SRA   Arrie Senate, PharmD PGY-1 Pharmacy Resident Pager: [phone] 11/07/2016

## 2016-11-07 NOTE — OR Nursing (Signed)
1011 SICU given update per [name], RN.

## 2016-11-07 NOTE — OR Nursing (Signed)
1153 Chest X-Ray performed per radiology.

## 2016-11-07 NOTE — OR Nursing (Signed)
1121 SICU called per [name], RN and made aware of delay to unit d/t evacuating hematoma to left leg.

## 2016-11-08 ENCOUNTER — Inpatient Hospital Stay (HOSPITAL_COMMUNITY): Payer: Medicaid Other

## 2016-11-08 LAB — BLOOD GAS, ARTERIAL
Acid-Base Excess: 0.1 mmol/L (ref 0.0–2.0)
Bicarbonate: 24.2 mmol/L (ref 20.0–28.0)
FIO2: 40
MECHVT: 450 mL
Nitric Oxide: 2
O2 Saturation: 97.9 %
PEEP: 5 cmH2O
Patient temperature: 98.6
RATE: 12 resp/min
pCO2 arterial: 38.9 mmHg (ref 32.0–48.0)
pH, Arterial: 7.411 (ref 7.350–7.450)
pO2, Arterial: 113 mmHg — ABNORMAL HIGH (ref 83.0–108.0)

## 2016-11-08 LAB — BASIC METABOLIC PANEL
Anion gap: 6 (ref 5–15)
BUN: 6 mg/dL (ref 6–20)
CO2: 24 mmol/L (ref 22–32)
Calcium: 7.9 mg/dL — ABNORMAL LOW (ref 8.9–10.3)
Chloride: 107 mmol/L (ref 101–111)
Creatinine, Ser: 0.64 mg/dL (ref 0.44–1.00)
GFR calc Af Amer: >60 mL/min (ref >60)
GFR calc non Af Amer: >60 mL/min (ref >60)
Glucose, Bld: 122 mg/dL — ABNORMAL HIGH (ref 65–99)
Potassium: 3.7 mmol/L (ref 3.5–5.1)
Sodium: 137 mmol/L (ref 135–145)

## 2016-11-08 LAB — POCT I-STAT, CHEM 8
BUN: 8 mg/dL (ref 6–20)
BUN: 8 mg/dL (ref 6–20)
CALCIUM ION: 1.14 mmol/L — AB (ref 1.15–1.40)
CHLORIDE: 96 mmol/L — AB (ref 101–111)
CREATININE: 0.7 mg/dL (ref 0.44–1.00)
CREATININE: 0.7 mg/dL (ref 0.44–1.00)
Calcium, Ion: 1.15 mmol/L (ref 1.15–1.40)
Chloride: 101 mmol/L (ref 101–111)
GLUCOSE: 129 mg/dL — AB (ref 65–99)
Glucose, Bld: 130 mg/dL — ABNORMAL HIGH (ref 65–99)
HCT: 36 % (ref 36.0–46.0)
HCT: 35 % — ABNORMAL LOW (ref 36.0–46.0)
Hemoglobin: 12.2 g/dL (ref 12.0–15.0)
Hemoglobin: 11.9 g/dL — ABNORMAL LOW (ref 12.0–15.0)
POTASSIUM: 3.9 mmol/L (ref 3.5–5.1)
Potassium: 3.9 mmol/L (ref 3.5–5.1)
SODIUM: 139 mmol/L (ref 135–145)
Sodium: 137 mmol/L (ref 135–145)
TCO2: 23 mmol/L (ref 0–100)
TCO2: 26 mmol/L (ref 0–100)

## 2016-11-08 LAB — CBC
HCT: 32.5 % — ABNORMAL LOW (ref 36.0–46.0)
HCT: 36 % (ref 36.0–46.0)
Hemoglobin: 11.4 g/dL — ABNORMAL LOW (ref 12.0–15.0)
Hemoglobin: 12.1 g/dL (ref 12.0–15.0)
MCH: 29.4 pg (ref 26.0–34.0)
MCH: 28.7 pg (ref 26.0–34.0)
MCHC: 35.1 g/dL (ref 30.0–36.0)
MCHC: 33.6 g/dL (ref 30.0–36.0)
MCV: 83.8 fL (ref 78.0–100.0)
MCV: 85.3 fL (ref 78.0–100.0)
Platelets: 99 10*3/uL — ABNORMAL LOW (ref 150–400)
Platelets: 102 10*3/uL — ABNORMAL LOW (ref 150–400)
RBC: 3.88 MIL/uL (ref 3.87–5.11)
RBC: 4.22 MIL/uL (ref 3.87–5.11)
RDW: 14.7 % (ref 11.5–15.5)
RDW: 15.1 % (ref 11.5–15.5)
WBC: 9.2 10*3/uL (ref 4.0–10.5)
WBC: 11.6 10*3/uL — ABNORMAL HIGH (ref 4.0–10.5)

## 2016-11-08 LAB — GLUCOSE, CAPILLARY
GLUCOSE-CAPILLARY: 131 mg/dL — AB (ref 65–99)
GLUCOSE-CAPILLARY: 102 mg/dL — AB (ref 65–99)
GLUCOSE-CAPILLARY: 119 mg/dL — AB (ref 65–99)
GLUCOSE-CAPILLARY: 116 mg/dL — AB (ref 65–99)
Glucose-Capillary: 109 mg/dL — ABNORMAL HIGH (ref 65–99)
Glucose-Capillary: 111 mg/dL — ABNORMAL HIGH (ref 65–99)

## 2016-11-08 LAB — PREPARE PLATELET PHERESIS: Unit division: 0

## 2016-11-08 LAB — PREPARE FRESH FROZEN PLASMA
UNIT DIVISION: 0
Unit division: 0

## 2016-11-08 LAB — COOXEMETRY PANEL
Carboxyhemoglobin: 2.1 % — ABNORMAL HIGH (ref 0.5–1.5)
Methemoglobin: 0.9 % (ref 0.0–1.5)
O2 Saturation: 71.7 %
Total hemoglobin: 11.3 g/dL — ABNORMAL LOW (ref 12.0–16.0)

## 2016-11-08 LAB — MAGNESIUM
Magnesium: 2.4 mg/dL (ref 1.7–2.4)
Magnesium: 2 mg/dL (ref 1.7–2.4)

## 2016-11-08 LAB — CALCIUM, IONIZED: Calcium, Ionized, Serum: 4.9 mg/dL (ref 4.5–5.6)

## 2016-11-08 MED ORDER — POTASSIUM CHLORIDE CRYS ER 20 MEQ PO TBCR
20.0000 meq | EXTENDED_RELEASE_TABLET | Freq: Three times a day (TID) | ORAL | Status: DC
  Administered 2016-11-08: 20 meq via ORAL
  Filled 2016-11-08: qty 1

## 2016-11-08 MED ORDER — SODIUM CHLORIDE 0.9 % IV SOLN
30.0000 meq | Freq: Once | INTRAVENOUS | Status: AC
  Administered 2016-11-08: 30 meq via INTRAVENOUS
  Filled 2016-11-08: qty 15

## 2016-11-08 MED ORDER — FUROSEMIDE 10 MG/ML IJ SOLN
40.0000 mg | Freq: Four times a day (QID) | INTRAMUSCULAR | Status: DC
  Administered 2016-11-08 (×2): 40 mg via INTRAVENOUS
  Filled 2016-11-08 (×2): qty 4

## 2016-11-08 MED ORDER — FUROSEMIDE 10 MG/ML IJ SOLN
10.0000 mg/h | INTRAVENOUS | Status: DC
  Administered 2016-11-08 – 2016-11-10 (×3): 10 mg/h via INTRAVENOUS
  Filled 2016-11-08 (×6): qty 25

## 2016-11-08 MED ORDER — DEXMEDETOMIDINE HCL IN NACL 400 MCG/100ML IV SOLN
0.4000 ug/kg/h | INTRAVENOUS | Status: DC
  Administered 2016-11-08 – 2016-11-09 (×9): 1.2 ug/kg/h via INTRAVENOUS
  Administered 2016-11-09: 1.1 ug/kg/h via INTRAVENOUS
  Administered 2016-11-10 (×3): 1.2 ug/kg/h via INTRAVENOUS
  Filled 2016-11-08 (×14): qty 100

## 2016-11-08 NOTE — Progress Notes (Signed)
Nitric turned off at this time, per MD written order. RN aware, RT to monitor as needed

## 2016-11-08 NOTE — Progress Notes (Addendum)
Advanced Heart Failure Rounding Note   Subjective:    40 y/o woman with HTN, CAD and tobacco use. Underwent stenting of LCX in 1/17. Readmitted for NSTEMI. Found to have 2v CAD with high-grade ostial LAD and LCX stensosis. Underwent CABG with Dr. Roxy Manns with LIMA to LAD, SVG to DIAG and SVG to LCX. Developed VF arrest and had re-do sternotomy begun at bedside with cardia massage. 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 Underwent re-do CABG x1 with SVG to OM and placement of biVAD support due to shock.   Went to OR 11/24  for explantation of bivads and chest closure. Did well.  Remains intubated. Now on epi 2, milrinone 0.3, NO off. Intubated on 40% FiO2    Weight up 46 pounds. Diuresing with IV lasix. Febrile overnight. On broad spectrum abx  CI = 2.5  CVP 14 PA 17/17  Objective:   Weight Range:  Vital Signs:   Temp:  [97.9 F (36.6 C)-102.6 F (39.2 C)] 100.9 F (38.3 C) (11/25 0900) Pulse Rate:  [102-128] 108 (11/25 0800) Resp:  [0-33] 25 (11/25 0900) BP: (86-107)/(56-78) 99/69 (11/25 0900) SpO2:  [94 %-100 %] 97 % (11/25 0800) Arterial Line BP: (77-136)/(39-87) 90/87 (11/25 0830) FiO2 (%):  [40 %-50 %] 50 % (11/25 0605) Weight:  [82.3 kg (181 lb 7 oz)] 82.3 kg (181 lb 7 oz) (11/25 0500) Last BM Date:  (PTA)  Weight change: Filed Weights   11/06/16 0443 11/07/16 0430 11/08/16 0500  Weight: 83.5 kg (184 lb 1.4 oz) 80.3 kg (177 lb 0.5 oz) 82.3 kg (181 lb 7 oz)    Intake/Output:   Intake/Output Summary (Last 24 hours) at 11/08/16 0922 Last data filed at 11/08/16 0913  Gross per 24 hour  Intake          6495.06 ml  Output             2510 ml  Net          3985.06 ml     Physical Exam: General:  Intubated. Sedated. edematous  HEENT: normal ETT Neck: supple.RIJ swan Cor: Chest closed. Dressing in place Lungs: coarse Abdomen: soft, nontender, +distended. quiet Extremities: warm 3+ edema. L groin sutured with drain Neuro: intubated sedated   Telemetry: Sinus  tach 100-110s (viewed personally)  Labs: Basic Metabolic Panel:  Recent Labs Lab 11/04/16 0300  11/04/16 1700 11/05/16 0215  11/06/16 0400  11/07/16 0400  11/07/16 0909 11/07/16 0943 11/07/16 1017 11/07/16 1222 11/07/16 1759 11/07/16 1830 11/08/16 0410  NA 144  < >  --  139  < > 138  < > 139  < > 140 138 137 141 138  --  137  K 3.7  < >  --  3.9  < > 3.6  < > 3.5  < > 3.3* 4.1 3.3* 3.2* 4.4  --  3.7  CL 106  < >  --  107  < > 107  < > 105  < > 99* 98* 99*  --  102  --  107  CO2 28  --   --  28  --  26  --  25  --   --   --   --   --   --   --  24  GLUCOSE 149*  < >  --  103*  < > 109*  < > 94  < > 130* 172* 170* 140* 137*  --  122*  BUN 10  < >  --  14  < > 9  < > 8  < > 6 5* 6  --  7  --  6  CREATININE 1.02*  < > 1.00 0.82  < > 0.87  < > 0.81  < > 0.60 0.60 0.60  --  0.60 0.72 0.64  CALCIUM 6.6*  --   --  6.6*  --  7.7*  --  8.3*  --   --   --   --   --   --   --  7.9*  MG 2.9*  --  2.2  --   --   --   --   --   --   --   --   --   --   --  2.6* 2.4  < > = values in this interval not displayed.  Liver Function Tests:  Recent Labs Lab 11/02/16 0037 11/05/16 0215 11/06/16 0400 11/07/16 0400  AST 18 128* 47* 28  ALT 16 70* 42 27  ALKPHOS 103 39 38 42  BILITOT 0.5 1.0 1.5* 1.3*  PROT 6.7 4.1* 4.7* 5.0*  ALBUMIN 3.7 2.6* 3.2* 3.1*   No results for input(s): LIPASE, AMYLASE in the last 168 hours. No results for input(s): AMMONIA in the last 168 hours.  CBC:  Recent Labs Lab 11/06/16 0400 11/06/16 1600 11/07/16 0400  11/07/16 1017 11/07/16 1222 11/07/16 1759 11/07/16 1830 11/08/16 0410  WBC 7.4 6.7 5.7  --   --   --   --  9.1 9.2  HGB 8.5* 8.7*  8.2* 9.6*  < > 8.8* 11.2* 10.5* 11.6* 11.4*  HCT 24.3* 25.2*  24.0* 27.5*  < > 26.0* 33.0* 31.0* 32.8* 32.5*  MCV 82.7 82.4 83.3  --   --   --   --  82.6 83.8  PLT 83* 72* 72*  --   --   --   --  113* 99*  < > = values in this interval not displayed.  Cardiac Enzymes:  Recent Labs Lab 11/01/16 1150  11/02/16 0827  TROPONINI 0.04* <0.03    BNP: BNP (last 3 results) No results for input(s): BNP in the last 8760 hours.  ProBNP (last 3 results) No results for input(s): PROBNP in the last 8760 hours.    Other results:  Imaging: Dg Chest Port 1 View  Result Date: 11/08/2016 CLINICAL DATA:  Respiratory failure EXAM: PORTABLE CHEST 1 VIEW COMPARISON:  11/07/2016 FINDINGS: Cardiac shadow is stable. Postoperative changes are again seen. Endotracheal tube, nasogastric catheter and Swan-Ganz catheter are again noted and stable. A nasogastric catheter remains coiled upon itself with the tip in the distal esophagus. Mediastinal drain and bilateral thoracostomy catheters are seen. No pneumothorax is noted. Poor inspiratory effort is again noted with right basilar atelectasis increased from the prior study. Central vascular congestion is again noted and stable. IMPRESSION: Increasing right basilar atelectasis. Vascular congestion stable from the prior exam. Tubes and lines as described. Electronically Signed   By: Inez Catalina M.D.   On: 11/08/2016 07:49   Dg Chest Portable 1 View  Result Date: 11/07/2016 CLINICAL DATA:  Status post removal of ventricular assist device EXAM: PORTABLE CHEST 1 VIEW COMPARISON:  11/07/2016 FINDINGS: Cardiac shadow is stable. Endotracheal tube is again identified in stable position. Swan-Ganz catheter is noted in the right pulmonary artery. There is been interval removal of the ventricular assist device. Nasogastric catheter is noted within the stomach although the tip lies in the distal esophagus coiled upon itself. Bilateral thoracostomy catheters  as well as a pericardial drain are seen. Postsurgical changes with median sternotomy wires and surgical staples are seen. Vascular congestion is noted in part due to a poor inspiratory effort. No focal infiltrate or pneumothorax is seen. Left subclavian line is again identified coursing into the left jugular vein and stable.  IMPRESSION: Interval removal of ventricular assist device. Tubes and lines as described above. The nasogastric catheter is coiled upon itself in the stomach with the tip lying in the distal esophagus. Vascular congestion in part due to poor inspiratory effort. Electronically Signed   By: Inez Catalina M.D.   On: 11/07/2016 12:06   Dg Chest Port 1 View  Result Date: 11/07/2016 CLINICAL DATA:  CABG. EXAM: PORTABLE CHEST 1 VIEW COMPARISON:  11/06/2016. FINDINGS: Left subclavian central line malposition again noted with the course of the catheter projecting caudally over the left internal jugular vein. Endotracheal tube, NG tube, Swan-Ganz catheter mediastinal drainage catheter, bilateral chest tubes, cardiac tubing in stable position. Heart size stable. Left perihilar surgical clips are noted. Low lung volumes with mild basilar atelectasis. No pleural effusion or pneumothorax. IMPRESSION: 1. Lines and tubes in stable position. Left subclavian central line malposition again noted with the course of the catheter projecting caudally over the left internal vein. No pneumothorax. 2. Low lung volumes with mild bibasilar atelectasis. Heart size normal. No pulmonary venous congestion. Electronically Signed   By: Marcello Moores  Register   On: 11/07/2016 07:18     Medications:     Scheduled Medications: . acetaminophen  1,000 mg Oral Q6H   Or  . acetaminophen (TYLENOL) oral liquid 160 mg/5 mL  1,000 mg Per Tube Q6H  . aspirin EC  325 mg Oral Daily   Or  . aspirin  324 mg Per Tube Daily  . bisacodyl  10 mg Oral Daily   Or  . bisacodyl  10 mg Rectal Daily  . cefUROXime (ZINACEF)  IV  1.5 g Intravenous Q12H  . chlorhexidine gluconate (MEDLINE KIT)  15 mL Mouth Rinse BID  . docusate sodium  200 mg Oral Daily  . furosemide  40 mg Intravenous Q6H  . insulin aspart  0-24 Units Subcutaneous Q4H  . mouth rinse  15 mL Mouth Rinse 10 times per day  . metoCLOPramide (REGLAN) injection  10 mg Intravenous Q6H  . [START ON  11/09/2016] pantoprazole  40 mg Oral Daily  . potassium chloride (KCL MULTIRUN) 30 mEq in 265 mL IVPB  30 mEq Intravenous Once  . sodium chloride flush  3 mL Intravenous Q12H  . vancomycin  1,000 mg Intravenous Q12H    Infusions: . sodium chloride    . sodium chloride    . sodium chloride 20 mL/hr at 11/08/16 0900  . dexmedetomidine 1.2 mcg/kg/hr (11/08/16 0900)  . EPINEPHrine 4 mg in dextrose 5% 250 mL infusion (16 mcg/mL) 2 mcg/min (11/08/16 0900)  . fentaNYL infusion INTRAVENOUS 350 mcg/hr (11/08/16 0900)  . lactated ringers 20 mL/hr at 11/08/16 0900  . lactated ringers    . milrinone 0.3 mcg/kg/min (11/08/16 0900)  . nitroGLYCERIN    . phenylephrine (NEO-SYNEPHRINE) Adult infusion 30.133 mcg/min (11/08/16 0900)    PRN Medications: sodium chloride, midazolam, morphine injection, ondansetron (ZOFRAN) IV, sodium chloride flush   Assessment:   1. Cardiogenic shock with acute biventricular failure    --s/p removal of bivads 11/24 2. VF arrest 3. CAD s/p CABG with emergent re-do 4. NSTEMI 5. Acute respiratory failure 6. Acute blood loss anemia 7. Hypokalemia 8. Left groin hematoma  8. 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 Inter-op TEE reviewed LVEF likely 40-45% range RV function remains poor.    She is doing well on low-dose inotropes. Co-ox 72% She is markedly volume overloaded.  Now off NO. Continue inotrope support to facilitate diuresis. On lasix 40 IV q6.. Can use lasix gtt as needed. Watch electrolytes. I added KCL 20 tid. Respiratory status looks good but agree with not weaning vent until further diuresed.   Continue broad spectrum abx. WBC stable at 9.2. Low threshold to add anti-fungal coverage as needed.  We will continually to follow closely.   The patient is critically ill with multiple organ systems failure and requires high complexity decision making for assessment and support, frequent evaluation  and titration of therapies, application of advanced monitoring technologies and extensive interpretation of multiple databases.   Critical Care Time devoted to patient care services described in this note is 35 Minutes.   Length of Stay: 8   Glori Bickers MD 11/08/2016, 9:22 AM  Advanced Heart Failure Team Pager [phone] (M-F; 7a - 4p)  Please contact [facility] for night-coverage after hours (4p -7a ) and weekends on [email or web address]

## 2016-11-08 NOTE — Progress Notes (Signed)
Patient ID: Christina CascoKelly Morrison, female   DOB: December 06, 1976, 40 y.o.   MRN: [id number]  CT surgery afternoon rounds:  She has been hemodynamically stable today on low dose milrinone and epi. CI 2.3 She has sinus tach 130 that may be related to fever, inotropes.  Diuresing well.  Sedated on vent but opens eyes and nods.

## 2016-11-08 NOTE — Progress Notes (Signed)
1 Day Post-Op Procedure(s) (LRB): STERNAL WASHOUT WITH STERNAL CLOSURE (N/A) TRANSESOPHAGEAL ECHOCARDIOGRAM (TEE) (N/A) REMOVAL OF CENTRIMAG VENTRICULAR ASSIST DEVICE (N/A) EVACUATION HEMATOMA (Left) Subjective:  Intubated and sedated on fentanyl and Precedex 1.2 with prn sedation for agitation.  Objective: Vital signs in last 24 hours: Temp:  [97.9 F (36.6 C)-102.6 F (39.2 C)] 101.3 F (38.5 C) (11/25 0730) Pulse Rate:  [102-128] 110 (11/25 0730) Cardiac Rhythm: Sinus tachycardia (11/25 0600) Resp:  [0-33] 17 (11/25 0730) BP: (86-107)/(56-78) 99/63 (11/25 0730) SpO2:  [94 %-100 %] 98 % (11/25 0730) Arterial Line BP: (77-136)/(39-85) 84/68 (11/25 0730) FiO2 (%):  [40 %-50 %] 50 % (11/25 0605) Weight:  [82.3 kg (181 lb 7 oz)] 82.3 kg (181 lb 7 oz) (11/25 0500)  Hemodynamic parameters for last 24 hours: PAP: (26-45)/(15-33) 33/18 CVP:  [11 mmHg-79 mmHg] 12 mmHg CO:  [4 L/min-5.2 L/min] 4.6 L/min CI:  [2.3 L/min/m2-3 L/min/m2] 2.6 L/min/m2  Intake/Output from previous day: 11/24 0701 - 11/25 0700 In: 5654.8 [I.V.:3577.2; Blood:1016; IV Piggyback:1061.6] Out: 1825 [Urine:1090; Drains:50; Chest Tube:685] Intake/Output this shift: Total I/O In: 200 [IV Piggyback:200] Out: -   General appearance: nods that she is in pain but not focusing or following commands Neurologic: sedated Heart: regular rate and rhythm, S1, S2 normal, no murmur, click, rub or gallop Lungs: rhonchi bilaterally Abdomen: hypoactive BS, soft,  Extremities: anasarca Wound: dressings dry  Lab Results:  Recent Labs  11/07/16 1830 11/08/16 0410  WBC 9.1 9.2  HGB 11.6* 11.4*  HCT 32.8* 32.5*  PLT 113* 99*   BMET:  Recent Labs  11/07/16 0400  11/07/16 1759 11/07/16 1830 11/08/16 0410  NA 139  < > 138  --  137  K 3.5  < > 4.4  --  3.7  CL 105  < > 102  --  107  CO2 25  --   --   --  24  GLUCOSE 94  < > 137*  --  122*  BUN 8  < > 7  --  6  CREATININE 0.81  < > 0.60 0.72 0.64  CALCIUM 8.3*   --   --   --  7.9*  < > = values in this interval not displayed.  PT/INR:  Recent Labs  11/07/16 1224  LABPROT 17.1*  INR 1.38   ABG    Component Value Date/Time   PHART 7.411 11/08/2016 0411   HCO3 24.2 11/08/2016 0411   TCO2 23 11/07/2016 1759   ACIDBASEDEF 3.0 (H) 11/03/2016 1950   O2SAT 71.7 11/08/2016 0411   O2SAT 97.9 11/08/2016 0411   CBG (last 3)   Recent Labs  11/07/16 2315 11/08/16 0334 11/08/16 0734  GLUCAP 106* 109* 131*   CLINICAL DATA:  Respiratory failure  EXAM: PORTABLE CHEST 1 VIEW  COMPARISON:  11/07/2016  FINDINGS: Cardiac shadow is stable. Postoperative changes are again seen. Endotracheal tube, nasogastric catheter and Swan-Ganz catheter are again noted and stable. A nasogastric catheter remains coiled upon itself with the tip in the distal esophagus. Mediastinal drain and bilateral thoracostomy catheters are seen. No pneumothorax is noted. Poor inspiratory effort is again noted with right basilar atelectasis increased from the prior study. Central vascular congestion is again noted and stable.  IMPRESSION: Increasing right basilar atelectasis.  Vascular congestion stable from the prior exam.  Tubes and lines as described.   Electronically Signed   By: Alcide CleverMark  Lukens M.D.   On: 11/08/2016 07:49  ECG: sinus tachy 110, otherwise normal  Assessment/Plan:  POD 5  s/p CABG, postop arrest with resuscitation, open chest in ICU, repeat CABG and bilateral Centrimags. POD1 s/p removal of Centrimag RVAD and LVAD  She has been hemodynamically stable overnight with good CI and Co-ox this am of 72% on milrinone 0.3, epi 2, neo. NO weaned off. Will continue current inotropic support.  Massive total body volume overload with weight up 23 lbs from preop yesterday am, 46 lbs from initial preop last week. Will start diuresis.  Gas exchange is ok but she is not weanable with 46 lbs of extra fluid on board. Continue ventilator support until  adequately diuresed. Replete K+.  Renal function stable.  Temp 102 last night and 101 this am. WBC normal. Continue broad spectrum antibiotic coverage. She has higher risk of infection with open chest for 3 days postop, multiple central lines, foley, prolonged intubation.   LOS: 8 days    Alleen BorneBryan K Rakayla Ricklefs 11/08/2016

## 2016-11-09 ENCOUNTER — Encounter (HOSPITAL_COMMUNITY): Payer: Self-pay | Admitting: Cardiothoracic Surgery

## 2016-11-09 ENCOUNTER — Inpatient Hospital Stay (HOSPITAL_COMMUNITY): Payer: Medicaid Other

## 2016-11-09 DIAGNOSIS — R5082 Postprocedural fever: Secondary | ICD-10-CM

## 2016-11-09 LAB — TYPE AND SCREEN
ABO/RH(D): O POS
Antibody Screen: POSITIVE
DAT, IgG: POSITIVE
Unit division: 0
Unit division: 0
Unit division: 0
Unit division: 0
Unit division: 0
Unit division: 0
Unit division: 0
Unit division: 0
Unit division: 0
Unit division: 0

## 2016-11-09 LAB — POCT I-STAT 4, (NA,K, GLUC, HGB,HCT)
Glucose, Bld: 307 mg/dL — ABNORMAL HIGH (ref 65–99)
HCT: 30 % — ABNORMAL LOW (ref 36.0–46.0)
Hemoglobin: 10.2 g/dL — ABNORMAL LOW (ref 12.0–15.0)
POTASSIUM: 4 mmol/L (ref 3.5–5.1)
SODIUM: 134 mmol/L — AB (ref 135–145)

## 2016-11-09 LAB — CBC
HCT: 33.1 % — ABNORMAL LOW (ref 36.0–46.0)
HEMOGLOBIN: 11.1 g/dL — AB (ref 12.0–15.0)
MCH: 28.9 pg (ref 26.0–34.0)
MCHC: 33.5 g/dL (ref 30.0–36.0)
MCV: 86.2 fL (ref 78.0–100.0)
PLATELETS: 87 10*3/uL — AB (ref 150–400)
RBC: 3.84 MIL/uL — ABNORMAL LOW (ref 3.87–5.11)
RDW: 15.1 % (ref 11.5–15.5)
WBC: 12.8 10*3/uL — ABNORMAL HIGH (ref 4.0–10.5)

## 2016-11-09 LAB — BASIC METABOLIC PANEL
Anion gap: 10 (ref 5–15)
BUN: 8 mg/dL (ref 6–20)
CHLORIDE: 99 mmol/L — AB (ref 101–111)
CO2: 25 mmol/L (ref 22–32)
CREATININE: 0.81 mg/dL (ref 0.44–1.00)
Calcium: 7.9 mg/dL — ABNORMAL LOW (ref 8.9–10.3)
GFR calc Af Amer: >60 mL/min (ref >60)
GFR calc non Af Amer: >60 mL/min (ref >60)
GLUCOSE: 191 mg/dL — AB (ref 65–99)
Potassium: 3.5 mmol/L (ref 3.5–5.1)
SODIUM: 134 mmol/L — AB (ref 135–145)

## 2016-11-09 LAB — GLUCOSE, CAPILLARY
GLUCOSE-CAPILLARY: 114 mg/dL — AB (ref 65–99)
GLUCOSE-CAPILLARY: 110 mg/dL — AB (ref 65–99)
Glucose-Capillary: 103 mg/dL — ABNORMAL HIGH (ref 65–99)
Glucose-Capillary: 131 mg/dL — ABNORMAL HIGH (ref 65–99)
Glucose-Capillary: 123 mg/dL — ABNORMAL HIGH (ref 65–99)
Glucose-Capillary: 129 mg/dL — ABNORMAL HIGH (ref 65–99)

## 2016-11-09 LAB — COOXEMETRY PANEL
Carboxyhemoglobin: 2.1 % — ABNORMAL HIGH (ref 0.5–1.5)
Methemoglobin: 1 % (ref 0.0–1.5)
O2 Saturation: 66.7 %
Total hemoglobin: 11 g/dL — ABNORMAL LOW (ref 12.0–16.0)

## 2016-11-09 MED ORDER — SODIUM CHLORIDE 0.9% FLUSH
10.0000 mL | INTRAVENOUS | Status: DC | PRN
  Administered 2016-11-16 – 2016-11-17 (×2): 30 mL
  Filled 2016-11-09 (×2): qty 40

## 2016-11-09 MED ORDER — POTASSIUM CHLORIDE 2 MEQ/ML IV SOLN
30.0000 meq | Freq: Once | INTRAVENOUS | Status: AC
  Administered 2016-11-09: 30 meq via INTRAVENOUS
  Filled 2016-11-09: qty 15

## 2016-11-09 MED ORDER — SODIUM CHLORIDE 0.9 % IV SOLN
30.0000 meq | Freq: Once | INTRAVENOUS | Status: AC
  Administered 2016-11-09: 30 meq via INTRAVENOUS
  Filled 2016-11-09: qty 15

## 2016-11-09 MED ORDER — VANCOMYCIN HCL IN DEXTROSE 1-5 GM/200ML-% IV SOLN
1000.0000 mg | Freq: Two times a day (BID) | INTRAVENOUS | Status: DC
  Administered 2016-11-09 – 2016-11-11 (×4): 1000 mg via INTRAVENOUS
  Filled 2016-11-09 (×5): qty 200

## 2016-11-09 MED ORDER — SODIUM CHLORIDE 0.9% FLUSH
10.0000 mL | Freq: Two times a day (BID) | INTRAVENOUS | Status: DC
  Administered 2016-11-09 – 2016-11-10 (×3): 10 mL
  Administered 2016-11-10 – 2016-11-11 (×2): 30 mL
  Administered 2016-11-12: 10 mL
  Administered 2016-11-12: 30 mL
  Administered 2016-11-15: 10 mL

## 2016-11-09 MED ORDER — POTASSIUM CHLORIDE 20 MEQ/15ML (10%) PO SOLN
40.0000 meq | Freq: Three times a day (TID) | ORAL | Status: DC
  Administered 2016-11-09 – 2016-11-11 (×5): 40 meq via ORAL
  Filled 2016-11-09 (×5): qty 30

## 2016-11-09 MED ORDER — POTASSIUM CHLORIDE CRYS ER 20 MEQ PO TBCR: 40.0000 meq | EXTENDED_RELEASE_TABLET | Freq: Three times a day (TID) | ORAL | Status: DC

## 2016-11-09 MED ORDER — SODIUM CHLORIDE 0.9 % IV SOLN: 30.0000 meq | Freq: Once | INTRAVENOUS | Status: DC

## 2016-11-09 MED ORDER — VANCOMYCIN HCL IN DEXTROSE 1-5 GM/200ML-% IV SOLN
1000.0000 mg | Freq: Once | INTRAVENOUS | Status: AC
  Administered 2016-11-09: 1000 mg via INTRAVENOUS
  Filled 2016-11-09: qty 200

## 2016-11-09 MED ORDER — VANCOMYCIN HCL 10 G IV SOLR
1250.0000 mg | Freq: Once | INTRAVENOUS | Status: DC
  Filled 2016-11-09: qty 1250

## 2016-11-09 MED ORDER — PIPERACILLIN-TAZOBACTAM 3.375 G IVPB
3.3750 g | Freq: Three times a day (TID) | INTRAVENOUS | Status: DC
  Administered 2016-11-09 – 2016-11-14 (×16): 3.375 g via INTRAVENOUS
  Filled 2016-11-09 (×19): qty 50

## 2016-11-09 NOTE — Progress Notes (Addendum)
Pharmacy Antibiotic Note  Christina Morrison is a 40 y.o. female s/p CABG, now with fevers and leukocytosis, possible sepsis.  Pharmacy has been consulted for Vancomycin  dosing.  Plan: Continue Vancomycin 1 g IV q12h  Height: 5\' 2"  (157.5 cm) Weight: 173 lb 1 oz (78.5 kg) IBW/kg (Calculated) : 50.1  Temp (24hrs), Avg:100.9 F (38.3 C), Min:99.9 F (37.7 C), Max:102 F (38.9 C)   Recent Labs Lab 11/06/16 0900  11/07/16 0400  11/07/16 1830 11/08/16 0410 11/08/16 1530 11/08/16 1538 11/08/16 2139 11/09/16 0419  WBC  --   < > 5.7  --  9.1 9.2 11.6*  --   --  12.8*  CREATININE  --   < > 0.81  < > 0.72 0.64  --  0.70 0.70 0.81  VANCOTROUGH 15  --   --   --   --   --   --   --   --   --   < > = values in this interval not displayed.  Estimated Creatinine Clearance: 89.6 mL/min (by C-G formula based on SCr of 0.81 mg/dL).    Allergies  Allergen Reactions  . Ranexa [Ranolazine] Nausea Only    Dizziness (only the 1,000 mg dose has this effect)    Eddie Candlebbott, Latasha Buczkowski [place] 11/09/2016 7:57 AM

## 2016-11-09 NOTE — Progress Notes (Signed)
2 Days Post-Op Procedure(s) (LRB): STERNAL WASHOUT WITH STERNAL CLOSURE (N/A) TRANSESOPHAGEAL ECHOCARDIOGRAM (TEE) (N/A) REMOVAL OF CENTRIMAG VENTRICULAR ASSIST DEVICE (N/A) EVACUATION HEMATOMA (Left) Subjective:  Intubated and sedated    Objective: Vital signs in last 24 hours: Temp:  [99.9 F (37.7 C)-102 F (38.9 C)] 100.9 F (38.3 C) (11/26 0730) Pulse Rate:  [67-110] 67 (11/26 0540) Cardiac Rhythm: Sinus tachycardia (11/26 0600) Resp:  [15-44] 19 (11/26 0730) BP: (58-119)/(49-87) 96/61 (11/26 0730) SpO2:  [95 %-100 %] 98 % (11/26 0600) Arterial Line BP: (85-91)/(71-87) 90/87 (11/25 0830) FiO2 (%):  [40 %-50 %] 40 % (11/26 0600) Weight:  [78.5 kg (173 lb 1 oz)] 78.5 kg (173 lb 1 oz) (11/26 0545)  Hemodynamic parameters for last 24 hours: PAP: (21-54)/(12-23) 22/12 CVP:  [7 mmHg-22 mmHg] 12 mmHg CO:  [4 L/min-4.7 L/min] 4.3 L/min CI:  [2.3 L/min/m2-2.7 L/min/m2] 2.5 L/min/m2  Intake/Output from previous day: 11/25 0701 - 11/26 0700 In: 4668.5 [I.V.:3273.5; NG/GT:150; IV Piggyback:1245] Out: [id number] [Urine:6185; Emesis/NG output:350; Drains:80; Chest Tube:280] Intake/Output this shift: No intake/output data recorded.  Neurologic: intact Heart: regular rate and rhythm and rub from tubes Lungs: few rhonchi Abdomen: soft, hypoactive bowel sounds Extremities: moderate anasarca Wound: chest dressing dry, leg incisions ok  Lab Results:  Recent Labs  11/08/16 1530  11/08/16 2139 11/09/16 0419  WBC 11.6*  --   --  12.8*  HGB 12.1  < > 11.9* 11.1*  HCT 36.0  < > 35.0* 33.1*  PLT 102*  --   --  87*  < > = values in this interval not displayed. BMET:  Recent Labs  11/08/16 0410  11/08/16 2139 11/09/16 0419  NA 137  < > 137 134*  K 3.7  < > 3.9 3.5  CL 107  < > 96* 99*  CO2 24  --   --  25  GLUCOSE 122*  < > 129* 191*  BUN 6  < > 8 8  CREATININE 0.64  < > 0.70 0.81  CALCIUM 7.9*  --   --  7.9*  < > = values in this interval not displayed.  PT/INR:  Recent  Labs  11/07/16 1224  LABPROT 17.1*  INR 1.38   ABG    Component Value Date/Time   PHART 7.411 11/08/2016 0411   HCO3 24.2 11/08/2016 0411   TCO2 26 11/08/2016 2139   ACIDBASEDEF 3.0 (H) 11/03/2016 1950   O2SAT 66.7 11/09/2016 0417   CBG (last 3)   Recent Labs  11/08/16 1909 11/08/16 2323 11/09/16 0346  GLUCAP 111* 116* 114*   CLINICAL DATA:  Reason for exam: hx of CABG. Pt has ETT.  EXAM: PORTABLE CHEST 1 VIEW  COMPARISON:  Chest x-rays dated 11/08/2016, 11/07/2016 and 11/06/2016.  FINDINGS: Endotracheal tube is stable in position with tip at the level of clavicles. Swan-Ganz catheter is grossly stable in position with tip just to the right of midline. Enteric tube passes below the diaphragm. Bilateral chest tubes appear stable in position. Stable position of the left subclavian central line with tip directed into the left internal jugular vein, with tip excluded from view.  Central pulmonary vascular congestion is stable compared to multiple prior exams. Probable mild bibasilar atelectasis persists. No new lung findings. No pneumothorax.  IMPRESSION: 1. Central pulmonary vascular congestion without overt alveolar pulmonary edema, stable compared to multiple prior exams. Probable mild bibasilar atelectasis. No new lung findings. 2. Stable positioning of the support apparatus, including the malpositioned left subclavian approach central line which courses  upwards into the left internal jugular vein with tip excluded from view. These results will be called to the ordering clinician or representative by the Radiologist Assistant, and communication documented in the PACS or zVision Dashboard.   Electronically Signed   By: Bary RichardStan  Maynard M.D.   On: 11/09/2016 07:09  Assessment/Plan:  POD 6 s/p CABG, postop arrest with resuscitation, open chest in ICU, repeat CABG and bilateral Centrimags. POD2 s/p removal of Centrimag RVAD and LVAD  She has been  hemodynamically stable overnight with good CI and Co-ox this am of 67% on milrinone 0.3, epi 2, neo.  PA pressures and CVP normal.  Will continue current inotropic support.  Massive total body volume overload: diuresing well with lasix drip. Continue K+ repletion.   Gas exchange is ok but she is not weanable with this amount of extra fluid on board. Continue ventilator support until adequately diuresed. CXR shows some pulmonary vascular congestion and atelectasis.  Renal function stable.  Temp 102 last night and 101 since. WBC minimally elevated. Continue broad spectrum antibiotic coverage. She has higher risk of infection with open chest for 3 days postop, multiple central lines, foley, prolonged intubation. She is too edematous to change central lines.   Neuro status: intact, following commands on high dose Precedex.   LOS: 9 days    Alleen BorneBryan K Fancy Dunkley 11/09/2016

## 2016-11-09 NOTE — Progress Notes (Addendum)
Advanced Heart Failure Rounding Note   Subjective:    40 y/o woman with HTN, CAD and tobacco use. Underwent stenting of LCX in 1/17. Readmitted for NSTEMI. Found to have 2v CAD with high-grade ostial LAD and LCX stensosis. Underwent CABG with Dr. Roxy Manns with LIMA to LAD, SVG to DIAG and SVG to LCX. Developed VF arrest and had re-do sternotomy begun at bedside with cardia massage. 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 Underwent re-do CABG x1 with SVG to OM and placement of biVAD support due to shock.   Went to OR 11/24  for explantation of bivads and chest closure. Did well.  Remains intubated. Now on epi 2, milrinone 0.3.  Intubated on 40% FiO2    Weight up 46 pounds from baseline. Diuresing with IV lasix. Weight down 8 pounds. Hemodynamics and renal function stable. K 3.5. Febrile overnight again. WBC up slightly. On broad spectrum abx  CI = 2.5  CVP 13 PA 29/20  Objective:   Weight Range:  Vital Signs:   Temp:  [99.9 F (37.7 C)-102 F (38.9 C)] 100.8 F (38.2 C) (11/26 0745) Pulse Rate:  [67-110] 67 (11/26 0540) Resp:  [15-44] 17 (11/26 0745) BP: (58-119)/(49-87) 109/77 (11/26 0745) SpO2:  [95 %-100 %] 98 % (11/26 0600) Arterial Line BP: (85-90)/(77-87) 90/87 (11/25 0830) FiO2 (%):  [40 %-50 %] 40 % (11/26 0600) Weight:  [78.5 kg (173 lb 1 oz)] 78.5 kg (173 lb 1 oz) (11/26 0545) Last BM Date:  (ducolax given)  Weight change: Filed Weights   11/07/16 0430 11/08/16 0500 11/09/16 0545  Weight: 80.3 kg (177 lb 0.5 oz) 82.3 kg (181 lb 7 oz) 78.5 kg (173 lb 1 oz)    Intake/Output:   Intake/Output Summary (Last 24 hours) at 11/09/16 0802 Last data filed at 11/09/16 0800  Gross per 24 hour  Intake          4218.27 ml  Output             7295 ml  Net         -3076.73 ml     Physical Exam: General:  Intubated. Sedated. edematous  HEENT: normal ETT Neck: supple.RIJ swan Cor: Chest closed. Dressing in place Lungs: coarse Abdomen: soft, nontender, +distended.  quiet Extremities: warm 3+ edema. L groin sutured with drain Neuro: intubated sedated   Telemetry: Sinus tach 100-110s (viewed personally)  Labs: Basic Metabolic Panel:  Recent Labs Lab 11/04/16 0300  11/04/16 1700 11/05/16 0215  11/06/16 0400  11/07/16 0400  11/07/16 1759 11/07/16 1830 11/08/16 0410 11/08/16 1530 11/08/16 1538 11/08/16 2139 11/09/16 0419  NA 144  < >  --  139  < > 138  < > 139  < > 138  --  137  --  139 137 134*  K 3.7  < >  --  3.9  < > 3.6  < > 3.5  < > 4.4  --  3.7  --  3.9 3.9 3.5  CL 106  < >  --  107  < > 107  < > 105  < > 102  --  107  --  101 96* 99*  CO2 28  --   --  28  --  26  --  25  --   --   --  24  --   --   --  25  GLUCOSE 149*  < >  --  103*  < > 109*  < > 94  < >  137*  --  122*  --  130* 129* 191*  BUN 10  < >  --  14  < > 9  < > 8  < > 7  --  6  --  '8 8 8  '$ CREATININE 1.02*  < > 1.00 0.82  < > 0.87  < > 0.81  < > 0.60 0.72 0.64  --  0.70 0.70 0.81  CALCIUM 6.6*  --   --  6.6*  --  7.7*  --  8.3*  --   --   --  7.9*  --   --   --  7.9*  MG 2.9*  --  2.2  --   --   --   --   --   --   --  2.6* 2.4 2.0  --   --   --   < > = values in this interval not displayed.  Liver Function Tests:  Recent Labs Lab 11/05/16 0215 11/06/16 0400 11/07/16 0400  AST 128* 47* 28  ALT 70* 42 27  ALKPHOS 39 38 42  BILITOT 1.0 1.5* 1.3*  PROT 4.1* 4.7* 5.0*  ALBUMIN 2.6* 3.2* 3.1*   No results for input(s): LIPASE, AMYLASE in the last 168 hours. No results for input(s): AMMONIA in the last 168 hours.  CBC:  Recent Labs Lab 11/07/16 0400  11/07/16 1830 11/08/16 0410 11/08/16 1530 11/08/16 1538 11/08/16 2139 11/09/16 0419  WBC 5.7  --  9.1 9.2 11.6*  --   --  12.8*  HGB 9.6*  < > 11.6* 11.4* 12.1 12.2 11.9* 11.1*  HCT 27.5*  < > 32.8* 32.5* 36.0 36.0 35.0* 33.1*  MCV 83.3  --  82.6 83.8 85.3  --   --  86.2  PLT 72*  --  113* 99* 102*  --   --  87*  < > = values in this interval not displayed.  Cardiac Enzymes:  Recent Labs Lab  11/02/16 0827  TROPONINI <0.03    BNP: BNP (last 3 results) No results for input(s): BNP in the last 8760 hours.  ProBNP (last 3 results) No results for input(s): PROBNP in the last 8760 hours.    Other results:  Imaging: Dg Chest Port 1 View  Result Date: 11/09/2016 CLINICAL DATA:  Reason for exam: hx of CABG. Pt has ETT. EXAM: PORTABLE CHEST 1 VIEW COMPARISON:  Chest x-rays dated 11/08/2016, 11/07/2016 and 11/06/2016. FINDINGS: Endotracheal tube is stable in position with tip at the level of clavicles. Swan-Ganz catheter is grossly stable in position with tip just to the right of midline. Enteric tube passes below the diaphragm. Bilateral chest tubes appear stable in position. Stable position of the left subclavian central line with tip directed into the left internal jugular vein, with tip excluded from view. Central pulmonary vascular congestion is stable compared to multiple prior exams. Probable mild bibasilar atelectasis persists. No new lung findings. No pneumothorax. IMPRESSION: 1. Central pulmonary vascular congestion without overt alveolar pulmonary edema, stable compared to multiple prior exams. Probable mild bibasilar atelectasis. No new lung findings. 2. Stable positioning of the support apparatus, including the malpositioned left subclavian approach central line which courses upwards into the left internal jugular vein with tip excluded from view. These results will be called to the ordering clinician or representative by the Radiologist Assistant, and communication documented in the PACS or zVision Dashboard. Electronically Signed   By: Franki Cabot M.D.   On: 11/09/2016 07:09   Dg Chest [facility]  1 View  Result Date: 11/08/2016 CLINICAL DATA:  Respiratory failure EXAM: PORTABLE CHEST 1 VIEW COMPARISON:  11/07/2016 FINDINGS: Cardiac shadow is stable. Postoperative changes are again seen. Endotracheal tube, nasogastric catheter and Swan-Ganz catheter are again noted and stable. A  nasogastric catheter remains coiled upon itself with the tip in the distal esophagus. Mediastinal drain and bilateral thoracostomy catheters are seen. No pneumothorax is noted. Poor inspiratory effort is again noted with right basilar atelectasis increased from the prior study. Central vascular congestion is again noted and stable. IMPRESSION: Increasing right basilar atelectasis. Vascular congestion stable from the prior exam. Tubes and lines as described. Electronically Signed   By: Inez Catalina M.D.   On: 11/08/2016 07:49   Dg Chest Portable 1 View  Result Date: 11/07/2016 CLINICAL DATA:  Status post removal of ventricular assist device EXAM: PORTABLE CHEST 1 VIEW COMPARISON:  11/07/2016 FINDINGS: Cardiac shadow is stable. Endotracheal tube is again identified in stable position. Swan-Ganz catheter is noted in the right pulmonary artery. There is been interval removal of the ventricular assist device. Nasogastric catheter is noted within the stomach although the tip lies in the distal esophagus coiled upon itself. Bilateral thoracostomy catheters as well as a pericardial drain are seen. Postsurgical changes with median sternotomy wires and surgical staples are seen. Vascular congestion is noted in part due to a poor inspiratory effort. No focal infiltrate or pneumothorax is seen. Left subclavian line is again identified coursing into the left jugular vein and stable. IMPRESSION: Interval removal of ventricular assist device. Tubes and lines as described above. The nasogastric catheter is coiled upon itself in the stomach with the tip lying in the distal esophagus. Vascular congestion in part due to poor inspiratory effort. Electronically Signed   By: Inez Catalina M.D.   On: 11/07/2016 12:06     Medications:     Scheduled Medications: . acetaminophen  1,000 mg Oral Q6H   Or  . acetaminophen (TYLENOL) oral liquid 160 mg/5 mL  1,000 mg Per Tube Q6H  . aspirin EC  325 mg Oral Daily   Or  . aspirin   324 mg Per Tube Daily  . bisacodyl  10 mg Oral Daily   Or  . bisacodyl  10 mg Rectal Daily  . chlorhexidine gluconate (MEDLINE KIT)  15 mL Mouth Rinse BID  . docusate sodium  200 mg Oral Daily  . insulin aspart  0-24 Units Subcutaneous Q4H  . mouth rinse  15 mL Mouth Rinse 10 times per day  . metoCLOPramide (REGLAN) injection  10 mg Intravenous Q6H  . pantoprazole  40 mg Oral Daily  . piperacillin-tazobactam (ZOSYN)  IV  3.375 g Intravenous Q8H  . potassium chloride (KCL MULTIRUN) 30 mEq in 265 mL IVPB  30 mEq Intravenous Once  . potassium chloride (KCL MULTIRUN) 30 mEq in 265 mL IVPB  30 mEq Intravenous Once  . potassium chloride SA  20 mEq Oral TID  . sodium chloride flush  10-40 mL Intracatheter Q12H  . sodium chloride flush  3 mL Intravenous Q12H  . vancomycin  1,250 mg Intravenous Once  . vancomycin  1,000 mg Intravenous Q12H    Infusions: . sodium chloride    . sodium chloride    . sodium chloride 20 mL/hr at 11/08/16 1900  . dexmedetomidine 1.2 mcg/kg/hr (11/09/16 0615)  . EPINEPHrine 4 mg in dextrose 5% 250 mL infusion (16 mcg/mL) 2 mcg/min (11/09/16 0700)  . fentaNYL infusion INTRAVENOUS 350 mcg/hr (11/09/16 0600)  . furosemide (LASIX)  infusion 10 mg/hr (11/09/16 0600)  . lactated ringers 20 mL (11/09/16 0229)  . lactated ringers    . milrinone 0.3 mcg/kg/min (11/09/16 0615)  . nitroGLYCERIN    . phenylephrine (NEO-SYNEPHRINE) Adult infusion 25 mcg/min (11/09/16 0600)    PRN Medications: sodium chloride, midazolam, morphine injection, ondansetron (ZOFRAN) IV, sodium chloride flush, sodium chloride flush   Assessment:   1. Cardiogenic shock with acute biventricular failure    --s/p removal of bivads 11/24 2. VF arrest 3. CAD s/p CABG with emergent re-do 4. NSTEMI 5. Acute respiratory failure 6. Acute blood loss anemia 7. Hypokalemia 8. Left groin hematoma 8. 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  Inter-op TEE reviewed LVEF likely 40-45% range RV function remains poor.   She is doing well on low-dose inotropes. Co-ox 67% She is markedly volume overloaded. Continue inotrope support to facilitate diuresis. On lasix gtt at 10/hr. Will continue. Increase KCL to 40 tid. Respiratory status looks good but agree with not weaning vent until further diuresed.   Febrile again overnight. Continue broad spectrum abx. WBC up at 12.6. Will redraw bcx. CXR viewed personally - ok.  Low threshold to add anti-fungal coverage as needed.  We will continually to follow closely.   The patient is critically ill with multiple organ systems failure and requires high complexity decision making for assessment and support, frequent evaluation and titration of therapies, application of advanced monitoring technologies and extensive interpretation of multiple databases.   Critical Care Time devoted to patient care services described in this note is 35 Minutes.   Length of Stay: 9   Glori Bickers MD 11/09/2016, 8:02 AM  Advanced Heart Failure Team Pager [phone] (M-F; [place])  Please contact [facility] for night-coverage after hours (4p -7a ) and weekends on [email or web address]

## 2016-11-09 NOTE — Progress Notes (Signed)
Patient ID: Christina CascoKelly Morrison, female   DOB: Aug 29, 1976, 40 y.o.   MRN: [id number]  SICU Evening Rounds:  Hemodynamically stable in sinus rhythm CI 2.4 on milrinone 0.3 and epi 2, neo 25  Remains stable on vent Sedated but awake.  Excellent diuresis on lasix drip. Repleting K+

## 2016-11-10 ENCOUNTER — Inpatient Hospital Stay (HOSPITAL_COMMUNITY): Payer: Medicaid Other

## 2016-11-10 LAB — BASIC METABOLIC PANEL
ANION GAP: 12 (ref 5–15)
Anion gap: 14 (ref 5–15)
BUN: 10 mg/dL (ref 6–20)
BUN: 12 mg/dL (ref 6–20)
CALCIUM: 8 mg/dL — AB (ref 8.9–10.3)
CHLORIDE: 93 mmol/L — AB (ref 101–111)
CO2: 26 mmol/L (ref 22–32)
CO2: 28 mmol/L (ref 22–32)
CREATININE: 0.89 mg/dL (ref 0.44–1.00)
Calcium: 8.5 mg/dL — ABNORMAL LOW (ref 8.9–10.3)
Chloride: 95 mmol/L — ABNORMAL LOW (ref 101–111)
Creatinine, Ser: 0.9 mg/dL (ref 0.44–1.00)
GFR calc Af Amer: >60 mL/min (ref >60)
GFR calc non Af Amer: >60 mL/min (ref >60)
Glucose, Bld: 239 mg/dL — ABNORMAL HIGH (ref 65–99)
Glucose, Bld: 239 mg/dL — ABNORMAL HIGH (ref 65–99)
Potassium: 3.4 mmol/L — ABNORMAL LOW (ref 3.5–5.1)
Potassium: 3.5 mmol/L (ref 3.5–5.1)
SODIUM: 133 mmol/L — AB (ref 135–145)
Sodium: 135 mmol/L (ref 135–145)

## 2016-11-10 LAB — GLUCOSE, CAPILLARY
GLUCOSE-CAPILLARY: 114 mg/dL — AB (ref 65–99)
GLUCOSE-CAPILLARY: 189 mg/dL — AB (ref 65–99)
GLUCOSE-CAPILLARY: 224 mg/dL — AB (ref 65–99)
Glucose-Capillary: 90 mg/dL (ref 65–99)
Glucose-Capillary: 86 mg/dL (ref 65–99)
Glucose-Capillary: 109 mg/dL — ABNORMAL HIGH (ref 65–99)
Glucose-Capillary: 132 mg/dL — ABNORMAL HIGH (ref 65–99)

## 2016-11-10 LAB — BLOOD GAS, ARTERIAL
ACID-BASE EXCESS: 6.8 mmol/L — AB (ref 0.0–2.0)
BICARBONATE: 30 mmol/L — AB (ref 20.0–28.0)
DRAWN BY: 331761
O2 Content: 3.5 L/min
O2 Saturation: 95 %
PATIENT TEMPERATURE: 98.6
PH ART: 7.517 — AB (ref 7.350–7.450)
PO2 ART: 69.7 mmHg — AB (ref 83.0–108.0)
pCO2 arterial: 37.3 mmHg (ref 32.0–48.0)

## 2016-11-10 LAB — CBC
HCT: 32.8 % — ABNORMAL LOW (ref 36.0–46.0)
HEMOGLOBIN: 11 g/dL — AB (ref 12.0–15.0)
MCH: 29.2 pg (ref 26.0–34.0)
MCHC: 33.5 g/dL (ref 30.0–36.0)
MCV: 87 fL (ref 78.0–100.0)
PLATELETS: 115 10*3/uL — AB (ref 150–400)
RBC: 3.77 MIL/uL — AB (ref 3.87–5.11)
RDW: 15 % (ref 11.5–15.5)
WBC: 11.2 10*3/uL — AB (ref 4.0–10.5)

## 2016-11-10 LAB — COOXEMETRY PANEL
Carboxyhemoglobin: 1.7 % — ABNORMAL HIGH (ref 0.5–1.5)
Methemoglobin: 0.9 % (ref 0.0–1.5)
O2 SAT: 69.5 %
TOTAL HEMOGLOBIN: 11.9 g/dL — AB (ref 12.0–16.0)

## 2016-11-10 MED ORDER — OXYCODONE HCL 5 MG PO TABS
5.0000 mg | ORAL_TABLET | ORAL | Status: DC | PRN
  Administered 2016-11-10 – 2016-11-11 (×2): 10 mg via ORAL
  Administered 2016-11-13: 5 mg via ORAL
  Administered 2016-11-13 – 2016-11-14 (×3): 10 mg via ORAL
  Filled 2016-11-10 (×5): qty 2
  Filled 2016-11-10: qty 1
  Filled 2016-11-10: qty 2

## 2016-11-10 MED ORDER — SODIUM CHLORIDE 0.9 % IV SOLN
30.0000 meq | Freq: Once | INTRAVENOUS | Status: AC
  Administered 2016-11-10: 30 meq via INTRAVENOUS
  Filled 2016-11-10: qty 15

## 2016-11-10 MED ORDER — ENOXAPARIN SODIUM 30 MG/0.3ML ~~LOC~~ SOLN
30.0000 mg | SUBCUTANEOUS | Status: DC
  Administered 2016-11-10 – 2016-11-17 (×8): 30 mg via SUBCUTANEOUS
  Filled 2016-11-10 (×8): qty 0.3

## 2016-11-10 MED ORDER — SODIUM CHLORIDE 0.9 % IV SOLN
INTRAVENOUS | Status: DC
  Administered 2016-11-13 – 2016-11-16 (×2): via INTRAVENOUS

## 2016-11-10 MED ORDER — FENTANYL 50 MCG/HR TD PT72
50.0000 ug | MEDICATED_PATCH | TRANSDERMAL | Status: DC
  Administered 2016-11-10 – 2016-11-13 (×2): 50 ug via TRANSDERMAL
  Filled 2016-11-10: qty 2
  Filled 2016-11-10: qty 1

## 2016-11-10 MED ORDER — INSULIN ASPART 100 UNIT/ML ~~LOC~~ SOLN
0.0000 [IU] | SUBCUTANEOUS | Status: DC
  Administered 2016-11-10 – 2016-11-11 (×3): 2 [IU] via SUBCUTANEOUS

## 2016-11-10 MED FILL — Electrolyte-R (PH 7.4) Solution: INTRAVENOUS | Qty: 3000 | Status: AC

## 2016-11-10 MED FILL — Sodium Chloride IV Soln 0.9%: INTRAVENOUS | Qty: 2000 | Status: AC

## 2016-11-10 MED FILL — Heparin Sodium (Porcine) Inj 1000 Unit/ML: INTRAMUSCULAR | Qty: 10 | Status: AC

## 2016-11-10 MED FILL — Mannitol IV Soln 20%: INTRAVENOUS | Qty: 500 | Status: AC

## 2016-11-10 MED FILL — Sodium Bicarbonate IV Soln 8.4%: INTRAVENOUS | Qty: 50 | Status: AC

## 2016-11-10 NOTE — Progress Notes (Signed)
Peripherally Inserted Central Catheter/Midline Placement  The IV Nurse has discussed with the patient and/or persons authorized to consent for the patient, the purpose of this procedure and the potential benefits and risks involved with this procedure.  The benefits include less needle sticks, lab draws from the catheter, and the patient may be discharged home with the catheter. Risks include, but not limited to, infection, bleeding, blood clot (thrombus formation), and puncture of an artery; nerve damage and irregular heartbeat and possibility to perform a PICC exchange if needed/ordered by physician.  Alternatives to this procedure were also discussed.  Bard Power PICC patient education guide, fact sheet on infection prevention and patient information card has been provided to patient /or left at bedside.    PICC/Midline Placement Documentation  PICC Triple Lumen 11/10/16 PICC Right Basilic 36 cm 0 cm (Active)  Indication for Insertion or Continuance of Line Prolonged intravenous therapies 11/10/2016 10:35 PM  Exposed Catheter (cm) 0 cm 11/10/2016 10:35 PM  Site Assessment Clean;Dry;Intact 11/10/2016 10:35 PM  Lumen #1 Status Blood return noted;Flushed;Saline locked 11/10/2016 10:35 PM  Lumen #2 Status Blood return noted;Flushed;Saline locked 11/10/2016 10:35 PM  Lumen #3 Status Blood return noted;Flushed;Saline locked 11/10/2016 10:35 PM  Dressing Type Transparent 11/10/2016 10:35 PM  Dressing Status Clean;Dry;Intact;Antimicrobial disc in place 11/10/2016 10:35 PM  Dressing Intervention New dressing 11/10/2016 10:35 PM  Dressing Change Due 11/17/16 11/10/2016 10:35 PM       Netta Corriganhomas, Christien Frankl L 11/10/2016, 10:44 PM

## 2016-11-10 NOTE — Anesthesia Postprocedure Evaluation (Signed)
Anesthesia Post Note  Patient: Christina Morrison  Procedure(s) Performed: Procedure(s) (LRB): STERNAL WASHOUT WITH STERNAL CLOSURE (N/A) TRANSESOPHAGEAL ECHOCARDIOGRAM (TEE) (N/A) REMOVAL OF CENTRIMAG VENTRICULAR ASSIST DEVICE (N/A) EVACUATION HEMATOMA (Left)  Patient location during evaluation: SICU Anesthesia Type: General Level of consciousness: patient remains intubated per anesthesia plan and sedated Pain management: pain level controlled Vital Signs Assessment: post-procedure vital signs reviewed and stable Respiratory status: patient on ventilator - see flowsheet for VS and patient remains intubated per anesthesia plan Cardiovascular status: blood pressure returned to baseline Anesthetic complications: no    Last Vitals:  Vitals:   11/10/16 1500 11/10/16 1600  BP: 111/72 115/76  Pulse: (!) 127 (!) 128  Resp: (!) 23 (!) 35  Temp: 37.3 C 37.2 C    Last Pain:  Vitals:   11/10/16 1500  TempSrc:   PainSc: 5                  Crisol Muecke COKER

## 2016-11-10 NOTE — Progress Notes (Signed)
[address]       [place] [id number]             [phone]        CARDIOTHORACIC SURGERY PROGRESS NOTE   R3 Days Post-Op Procedure(s) (LRB): STERNAL WASHOUT WITH STERNAL CLOSURE (N/A) TRANSESOPHAGEAL ECHOCARDIOGRAM (TEE) (N/A) REMOVAL OF CENTRIMAG VENTRICULAR ASSIST DEVICE (N/A) EVACUATION HEMATOMA (Left)  Subjective: Sedated on vent.  Opens eyes to voice.  Looks comfortable  Objective: Vital signs: BP Readings from Last 1 Encounters:  11/10/16 (!) 89/57   Pulse Readings from Last 1 Encounters:  11/10/16 (!) 104   Resp Readings from Last 1 Encounters:  11/10/16 17   Temp Readings from Last 1 Encounters:  11/10/16 100.2 F (37.9 C)    Hemodynamics: PAP: (19-42)/(11-26) 19/11 CVP:  [5 mmHg-22 mmHg] 5 mmHg CO:  [3.9 L/min-4.6 L/min] 4 L/min CI:  [2.3 L/min/m2-2.7 L/min/m2] 2.3 L/min/m2  Physical Exam:  Rhythm:   sinus  Breath sounds: coarse  Heart sounds:  RRR  Incisions:  Dressings dry, intact  Abdomen:  Soft, non-distended, non-tender  Extremities:  Warm, well-perfused  Chest tubes:  Low volume thin serosanguinous output, no air leak    Intake/Output from previous day: 11/26 0701 - 11/27 0700 In: 4123 [I.V.:2863; NG/GT:65; IV Piggyback:1195] Out: 6800 [Urine:5715; Emesis/NG output:900; Drains:55; Chest Tube:130] Intake/Output this shift: Total I/O In: 158.2 [I.V.:158.2] Out: -   Lab Results:  CBC: Recent Labs  11/09/16 0419 11/09/16 1743 11/10/16 0510  WBC 12.8*  --  11.2*  HGB 11.1* 10.2* 11.0*  HCT 33.1* 30.0* 32.8*  PLT 87*  --  115*    BMET:  Recent Labs  11/09/16 0419 11/09/16 1743 11/10/16 0510  NA 134* 134* 133*  K 3.5 4.0 3.4*  CL 99*  --  95*  CO2 25  --  26  GLUCOSE 191* 307* 239*  BUN 8  --  10  CREATININE 0.81  --  0.90  CALCIUM 7.9*  --  8.0*     PT/INR:   Recent Labs  11/07/16 1224  LABPROT 17.1*  INR 1.38    CBG (last 3)   Recent Labs  11/09/16 1941 11/10/16 0022  11/10/16 0434  GLUCAP 129* 189* 224*    ABG    Component Value Date/Time   PHART 7.411 11/08/2016 0411   PCO2ART 38.9 11/08/2016 0411   PO2ART 113 (H) 11/08/2016 0411   HCO3 24.2 11/08/2016 0411   TCO2 26 11/08/2016 2139   ACIDBASEDEF 3.0 (H) 11/03/2016 1950   O2SAT 66.7 11/09/2016 0417    CXR: PORTABLE CHEST 1 VIEW  COMPARISON:  11/09/2016.  FINDINGS: Left subclavian line again noted coursing of the left IJ. Endotracheal tube, NG tube, Swan-Ganz catheter, mediastinal drainage and bilateral chest tubes are in stable position. No pneumothorax.  IMPRESSION: 1. Left subclavian line again noted coursing up the left IJ . Remaining lines and tubes in stable position. No pneumothorax.  2. Prior CABG. Heart size normal. Mild stable pulmonary venous congestion.  3. Low lung volumes with bibasilar atelectasis. Mild left base infiltrate cannot be excluded . Chest is unchanged from prior exam.   Electronically Signed   By: Maisie Fushomas  Register   On: 11/10/2016 07:03  Assessment/Plan: S/P Procedure(s) (LRB): STERNAL WASHOUT WITH STERNAL CLOSURE (N/A) TRANSESOPHAGEAL ECHOCARDIOGRAM (TEE) (N/A) REMOVAL OF CENTRIMAG VENTRICULAR ASSIST DEVICE (N/A) EVACUATION HEMATOMA (Left)  Overall stable Maintaining NSR w/ stable hemodynamics on low dose milrinone, Epi and Neo drips - PA pressures low Acute  systolic CHF with expected post-op volume excess, diuresing fairly well - I/O's negative 2.7 liters yesterday and weight down 20 lbs over the weekend Expected post op acute blood loss anemia, Hgb 11.0 stable Post op thrombocytopenia, platelet count up to 115k Hypokalemia, induced by loop diuretics Low grade fevers w/ minimal leukocytosis, no clear signs of infection but all central lines old   Insert PICC line and remove old central lines  Wean drips   Acute vent wean  Continue diuresis  Continue empiric antibiotics for now   Purcell Nailslarence H Owen, MD 11/10/2016 8:23 AM

## 2016-11-10 NOTE — Procedures (Signed)
Extubation Procedure Note  Patient Details:   Name: Christina CascoKelly Rinker DOB: [birth year] MRN: [id number]   Airway Documentation:     Evaluation  O2 sats: stable throughout Complications: No apparent complications Patient did tolerate procedure well. Bilateral Breath Sounds: Rhonchi   Yes   Pt. Was extubated to a 4L Elaine without any complications, dyspnea or stridor noted. Pt. Was instructed on IS x 5, highest goal achieved was 400mL (poor effort)  Reality Dejonge L 11/10/2016, 11:52 PM

## 2016-11-10 NOTE — Plan of Care (Signed)
Wasted 50 cc in sink witnessed by Parker Hannifinaylor RN

## 2016-11-10 NOTE — Progress Notes (Signed)
      [address]       [place] [id number]             [phone]      Extubated earlier  BP 115/76   Pulse (!) 128   Temp 99 F (37.2 C)   Resp (!) 35   Ht 5\' 2"  (1.575 m)   Wt 164 lb 0.4 oz (74.4 kg)   LMP 10/27/2016 (Exact Date)   SpO2 99%   BMI 30.00 kg/m   In sinus tachy(confirmed with atrial ECG)   Intake/Output Summary (Last 24 hours) at 11/10/16 1629 Last data filed at 11/10/16 1600  Gross per 24 hour  Intake          3518.29 ml  Output             6511 ml  Net         -2992.71 ml   K= 3.5- being supplemented On milrinone and epi drips  Viviann SpareSteven C. Dorris FetchHendrickson, MD [facility] [phone]

## 2016-11-10 NOTE — Anesthesia Postprocedure Evaluation (Signed)
Anesthesia Post Note  Patient: Christina Morrison  Procedure(s) Performed: Procedure(s) (LRB): STERNAL WASHOUT WITH STERNAL CLOSURE (N/A) TRANSESOPHAGEAL ECHOCARDIOGRAM (TEE) (N/A) REMOVAL OF CENTRIMAG VENTRICULAR ASSIST DEVICE (N/A) EVACUATION HEMATOMA (Left)  Anesthesia Post Evaluation  Last Vitals:  Vitals:   11/10/16 1500 11/10/16 1600  BP: 111/72 115/76  Pulse: (!) 127 (!) 128  Resp: (!) 23 (!) 35  Temp: 37.3 C 37.2 C    Last Pain:  Vitals:   11/10/16 1500  TempSrc:   PainSc: 5                  [name]

## 2016-11-10 NOTE — Progress Notes (Signed)
Advanced Heart Failure Rounding Note   Subjective:    40 y/o woman with HTN, CAD and tobacco use. Underwent stenting of LCX in 1/17. Readmitted for NSTEMI. Found to have 2v CAD with high-grade ostial LAD and LCX stensosis. Underwent CABG with Dr. Roxy Manns with LIMA to LAD, SVG to DIAG and SVG to LCX. Developed VF arrest and had re-do sternotomy begun at bedside with cardia massage. 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 Underwent re-do CABG x1 with SVG to OM and placement of biVAD support due to shock.   Went to OR 11/24  for explantation of bivads and chest closure. Did well.  Remains intubated. Awake on vent.   Norepi off. Now on neo 20, milrinone 0.3.  Intubated on 40% FiO2     Diuresing very well with IV lasix. Weight down 17 pounds. Hemodynamics and renal function stable. K 3.4. Febrile overnight again. WBC down slightly. On broad spectrum abx. Bcx NGTD  CI = 3.2  CVP 11 PA 27/19  Objective:   Weight Range:  Vital Signs:   Temp:  [100.2 F (37.9 C)-100.8 F (38.2 C)] 100.4 F (38 C) (11/27 1130) Pulse Rate:  [87-114] 104 (11/27 0800) Resp:  [16-48] 30 (11/27 1130) BP: (81-121)/(56-85) 121/75 (11/27 1130) SpO2:  [66 %-100 %] 99 % (11/27 1055) FiO2 (%):  [40 %] 40 % (11/27 1055) Weight:  [74.4 kg (164 lb 0.4 oz)] 74.4 kg (164 lb 0.4 oz) (11/27 0445) Last BM Date: 11/10/16  Weight change: Filed Weights   11/08/16 0500 11/09/16 0545 11/10/16 0445  Weight: 82.3 kg (181 lb 7 oz) 78.5 kg (173 lb 1 oz) 74.4 kg (164 lb 0.4 oz)    Intake/Output:   Intake/Output Summary (Last 24 hours) at 11/10/16 1155 Last data filed at 11/10/16 1100  Gross per 24 hour  Intake          3904.52 ml  Output             6956 ml  Net         -3051.48 ml     Physical Exam: General:  Intubated. Sedated. Awake following commands. HEENT: normal ETT Neck: supple.RIJ swan Cor: Chest closed. Dressing in place Lungs: coarse Abdomen: soft, nontender, +distended. quiet Extremities: warm 2+  edema. L groin sutured with drain Neuro: intubated awake  Telemetry: Sinus tach 100-110s (viewed personally)  Labs: Basic Metabolic Panel:  Recent Labs Lab 11/04/16 0300  11/04/16 1700  11/06/16 0400  11/07/16 0400  11/07/16 1830 11/08/16 0410 11/08/16 1530 11/08/16 1538 11/08/16 2139 11/09/16 0419 11/09/16 1743 11/10/16 0510  NA 144  < >  --   < > 138  < > 139  < >  --  137  --  139 137 134* 134* 133*  K 3.7  < >  --   < > 3.6  < > 3.5  < >  --  3.7  --  3.9 3.9 3.5 4.0 3.4*  CL 106  < >  --   < > 107  < > 105  < >  --  107  --  101 96* 99*  --  95*  CO2 28  --   --   < > 26  --  25  --   --  24  --   --   --  25  --  26  GLUCOSE 149*  < >  --   < > 109*  < > 94  < >  --  122*  --  130* 129* 191* 307* 239*  BUN 10  < >  --   < > 9  < > 8  < >  --  6  --  '8 8 8  '$ --  10  CREATININE 1.02*  < > 1.00  < > 0.87  < > 0.81  < > 0.72 0.64  --  0.70 0.70 0.81  --  0.90  CALCIUM 6.6*  --   --   < > 7.7*  --  8.3*  --   --  7.9*  --   --   --  7.9*  --  8.0*  MG 2.9*  --  2.2  --   --   --   --   --  2.6* 2.4 2.0  --   --   --   --   --   < > = values in this interval not displayed.  Liver Function Tests:  Recent Labs Lab 11/05/16 0215 11/06/16 0400 11/07/16 0400  AST 128* 47* 28  ALT 70* 42 27  ALKPHOS 39 38 42  BILITOT 1.0 1.5* 1.3*  PROT 4.1* 4.7* 5.0*  ALBUMIN 2.6* 3.2* 3.1*   No results for input(s): LIPASE, AMYLASE in the last 168 hours. No results for input(s): AMMONIA in the last 168 hours.  CBC:  Recent Labs Lab 11/07/16 1830 11/08/16 0410 11/08/16 1530 11/08/16 1538 11/08/16 2139 11/09/16 0419 11/09/16 1743 11/10/16 0510  WBC 9.1 9.2 11.6*  --   --  12.8*  --  11.2*  HGB 11.6* 11.4* 12.1 12.2 11.9* 11.1* 10.2* 11.0*  HCT 32.8* 32.5* 36.0 36.0 35.0* 33.1* 30.0* 32.8*  MCV 82.6 83.8 85.3  --   --  86.2  --  87.0  PLT 113* 99* 102*  --   --  87*  --  115*    Cardiac Enzymes: No results for input(s): CKTOTAL, CKMB, CKMBINDEX, TROPONINI in the last 168  hours.  BNP: BNP (last 3 results) No results for input(s): BNP in the last 8760 hours.  ProBNP (last 3 results) No results for input(s): PROBNP in the last 8760 hours.    Other results:  Imaging: Dg Chest Port 1 View  Result Date: 11/10/2016 CLINICAL DATA:  Chest tube. EXAM: PORTABLE CHEST 1 VIEW COMPARISON:  11/09/2016. FINDINGS: Left subclavian line again noted coursing of the left IJ. Endotracheal tube, NG tube, Swan-Ganz catheter, mediastinal drainage and bilateral chest tubes are in stable position. No pneumothorax. IMPRESSION: 1. Left subclavian line again noted coursing up the left IJ . Remaining lines and tubes in stable position. No pneumothorax. 2. Prior CABG. Heart size normal. Mild stable pulmonary venous congestion. 3. Low lung volumes with bibasilar atelectasis. Mild left base infiltrate cannot be excluded . Chest is unchanged from prior exam. Electronically Signed   By: Marcello Moores  Register   On: 11/10/2016 07:03   Dg Chest Port 1 View  Result Date: 11/09/2016 CLINICAL DATA:  Reason for exam: hx of CABG. Pt has ETT. EXAM: PORTABLE CHEST 1 VIEW COMPARISON:  Chest x-rays dated 11/08/2016, 11/07/2016 and 11/06/2016. FINDINGS: Endotracheal tube is stable in position with tip at the level of clavicles. Swan-Ganz catheter is grossly stable in position with tip just to the right of midline. Enteric tube passes below the diaphragm. Bilateral chest tubes appear stable in position. Stable position of the left subclavian central line with tip directed into the left internal jugular vein, with tip excluded from view. Central pulmonary vascular congestion is stable  compared to multiple prior exams. Probable mild bibasilar atelectasis persists. No new lung findings. No pneumothorax. IMPRESSION: 1. Central pulmonary vascular congestion without overt alveolar pulmonary edema, stable compared to multiple prior exams. Probable mild bibasilar atelectasis. No new lung findings. 2. Stable positioning of  the support apparatus, including the malpositioned left subclavian approach central line which courses upwards into the left internal jugular vein with tip excluded from view. These results will be called to the ordering clinician or representative by the Radiologist Assistant, and communication documented in the PACS or zVision Dashboard. Electronically Signed   By: Franki Cabot M.D.   On: 11/09/2016 07:09     Medications:     Scheduled Medications: . acetaminophen  1,000 mg Oral Q6H   Or  . acetaminophen (TYLENOL) oral liquid 160 mg/5 mL  1,000 mg Per Tube Q6H  . aspirin EC  325 mg Oral Daily   Or  . aspirin  324 mg Per Tube Daily  . bisacodyl  10 mg Oral Daily   Or  . bisacodyl  10 mg Rectal Daily  . chlorhexidine gluconate (MEDLINE KIT)  15 mL Mouth Rinse BID  . docusate sodium  200 mg Oral Daily  . enoxaparin (LOVENOX) injection  30 mg Subcutaneous Q24H  . insulin aspart  0-24 Units Subcutaneous Q4H  . mouth rinse  15 mL Mouth Rinse 10 times per day  . metoCLOPramide (REGLAN) injection  10 mg Intravenous Q6H  . pantoprazole  40 mg Oral Daily  . piperacillin-tazobactam (ZOSYN)  IV  3.375 g Intravenous Q8H  . potassium chloride  40 mEq Oral TID  . potassium chloride (KCL MULTIRUN) 30 mEq in 265 mL IVPB  30 mEq Intravenous Once  . sodium chloride flush  10-40 mL Intracatheter Q12H  . sodium chloride flush  3 mL Intravenous Q12H  . vancomycin  1,000 mg Intravenous Q12H    Infusions: . sodium chloride    . sodium chloride    . sodium chloride 20 mL/hr at 11/10/16 1100  . dexmedetomidine Stopped (11/10/16 1031)  . EPINEPHrine 4 mg in dextrose 5% 250 mL infusion (16 mcg/mL) Stopped (11/10/16 1031)  . furosemide (LASIX) infusion 10 mg/hr (11/10/16 1100)  . milrinone 0.2 mcg/kg/min (11/10/16 1100)  . phenylephrine (NEO-SYNEPHRINE) Adult infusion 20 mcg/min (11/10/16 1133)    PRN Medications: sodium chloride, midazolam, morphine injection, ondansetron (ZOFRAN) IV, sodium  chloride flush, sodium chloride flush   Assessment:   1. Cardiogenic shock with acute biventricular failure    --s/p removal of bivads 11/24 2. VF arrest 3. CAD s/p CABG with emergent re-do 4. NSTEMI 5. Acute respiratory failure 6. Acute blood loss anemia 7. Hypokalemia 8. Left groin hematoma 8. 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 Inter-op TEE reviewed LVEF likely 40-45% range RV function remains poor.   She is doing well on low-dose inotropes. Co-ox 70%. Volume status much improved but still elevated. Likely stable for extubation today. Continue milrinone support to facilitate diuresis. Can wean neo as tolerated. Continue lasix gtt at 10/hr.. Increase KCL to 40 tid.   Febrile again overnight. Continue broad spectrum abx. WBC down slightly. BCx NGTD .Low threshold to add anti-fungal coverage as needed.  Resume psych meds once extubated.   We will continually to follow closely.   The patient is critically ill with multiple organ systems failure and requires high complexity decision making for assessment and support, frequent evaluation and titration of therapies, application of advanced monitoring  technologies and extensive interpretation of multiple databases.   Critical Care Time devoted to patient care services described in this note is 35 Minutes.   Length of Stay: 10   Glori Bickers MD 11/10/2016, 11:55 AM  Advanced Heart Failure Team Pager [phone] (M-F; 7a - 4p)  Please contact [facility] for night-coverage after hours (4p -7a ) and weekends on [email or web address]

## 2016-11-11 ENCOUNTER — Inpatient Hospital Stay (HOSPITAL_COMMUNITY): Payer: Medicaid Other

## 2016-11-11 LAB — COMPREHENSIVE METABOLIC PANEL
ALBUMIN: 3 g/dL — AB (ref 3.5–5.0)
ALT: 35 U/L (ref 14–54)
ANION GAP: 14 (ref 5–15)
AST: 49 U/L — AB (ref 15–41)
Alkaline Phosphatase: 79 U/L (ref 38–126)
BUN: 16 mg/dL (ref 6–20)
CALCIUM: 9.1 mg/dL (ref 8.9–10.3)
CHLORIDE: 96 mmol/L — AB (ref 101–111)
CO2: 29 mmol/L (ref 22–32)
Creatinine, Ser: 1.04 mg/dL — ABNORMAL HIGH (ref 0.44–1.00)
GFR calc Af Amer: >60 mL/min (ref >60)
GFR calc non Af Amer: >60 mL/min (ref >60)
GLUCOSE: 131 mg/dL — AB (ref 65–99)
POTASSIUM: 3.8 mmol/L (ref 3.5–5.1)
Sodium: 139 mmol/L (ref 135–145)
Total Bilirubin: 1.8 mg/dL — ABNORMAL HIGH (ref 0.3–1.2)
Total Protein: 6.6 g/dL (ref 6.5–8.1)

## 2016-11-11 LAB — CBC
HCT: 36.8 % (ref 36.0–46.0)
Hemoglobin: 12.5 g/dL (ref 12.0–15.0)
MCH: 29.5 pg (ref 26.0–34.0)
MCHC: 34 g/dL (ref 30.0–36.0)
MCV: 86.8 fL (ref 78.0–100.0)
PLATELETS: 228 10*3/uL (ref 150–400)
RBC: 4.24 MIL/uL (ref 3.87–5.11)
RDW: 14.6 % (ref 11.5–15.5)
WBC: 14.1 10*3/uL — AB (ref 4.0–10.5)

## 2016-11-11 LAB — GLUCOSE, CAPILLARY
GLUCOSE-CAPILLARY: 138 mg/dL — AB (ref 65–99)
Glucose-Capillary: 137 mg/dL — ABNORMAL HIGH (ref 65–99)
Glucose-Capillary: 142 mg/dL — ABNORMAL HIGH (ref 65–99)
Glucose-Capillary: 108 mg/dL — ABNORMAL HIGH (ref 65–99)

## 2016-11-11 LAB — COOXEMETRY PANEL
CARBOXYHEMOGLOBIN: 1.5 % (ref 0.5–1.5)
METHEMOGLOBIN: 1 % (ref 0.0–1.5)
O2 SAT: 67.3 %
TOTAL HEMOGLOBIN: 13 g/dL (ref 12.0–16.0)

## 2016-11-11 LAB — VANCOMYCIN, TROUGH: Vancomycin Tr: 21 ug/mL (ref 15–20)

## 2016-11-11 MED ORDER — PANTOPRAZOLE SODIUM 40 MG PO TBEC
40.0000 mg | DELAYED_RELEASE_TABLET | Freq: Every day | ORAL | Status: DC
  Administered 2016-11-11 – 2016-11-17 (×7): 40 mg via ORAL
  Filled 2016-11-11 (×3): qty 1
  Filled 2016-11-11: qty 2
  Filled 2016-11-11 (×3): qty 1

## 2016-11-11 MED ORDER — VANCOMYCIN HCL IN DEXTROSE 750-5 MG/150ML-% IV SOLN
750.0000 mg | Freq: Two times a day (BID) | INTRAVENOUS | Status: DC
  Administered 2016-11-11 – 2016-11-13 (×4): 750 mg via INTRAVENOUS
  Filled 2016-11-11 (×8): qty 150

## 2016-11-11 MED ORDER — CLOPIDOGREL BISULFATE 75 MG PO TABS
75.0000 mg | ORAL_TABLET | Freq: Every day | ORAL | Status: DC
  Administered 2016-11-11 – 2016-11-17 (×7): 75 mg via ORAL
  Filled 2016-11-11 (×7): qty 1

## 2016-11-11 MED ORDER — ATORVASTATIN CALCIUM 80 MG PO TABS
80.0000 mg | ORAL_TABLET | Freq: Every day | ORAL | Status: DC
  Administered 2016-11-12 – 2016-11-16 (×5): 80 mg via ORAL
  Filled 2016-11-11 (×5): qty 1

## 2016-11-11 MED ORDER — DOXEPIN HCL 25 MG PO CAPS: 25.0000 mg | ORAL_CAPSULE | Freq: Every day | ORAL | Status: DC

## 2016-11-11 MED ORDER — METOPROLOL TARTRATE 12.5 MG HALF TABLET
12.5000 mg | ORAL_TABLET | Freq: Two times a day (BID) | ORAL | Status: DC
  Administered 2016-11-11 – 2016-11-17 (×12): 12.5 mg via ORAL
  Filled 2016-11-11 (×13): qty 1

## 2016-11-11 MED ORDER — FUROSEMIDE 10 MG/ML IJ SOLN
20.0000 mg | Freq: Four times a day (QID) | INTRAMUSCULAR | Status: AC
  Administered 2016-11-11 – 2016-11-12 (×3): 20 mg via INTRAVENOUS
  Filled 2016-11-11 (×3): qty 2

## 2016-11-11 MED ORDER — SODIUM CHLORIDE 0.9 % IV SOLN
30.0000 meq | Freq: Once | INTRAVENOUS | Status: AC
  Administered 2016-11-11: 30 meq via INTRAVENOUS
  Filled 2016-11-11: qty 15

## 2016-11-11 MED ORDER — BUPROPION HCL ER (XL) 300 MG PO TB24
300.0000 mg | ORAL_TABLET | Freq: Every day | ORAL | Status: DC
  Administered 2016-11-11 – 2016-11-17 (×7): 300 mg via ORAL
  Filled 2016-11-11 (×3): qty 1
  Filled 2016-11-11 (×2): qty 2
  Filled 2016-11-11 (×2): qty 1

## 2016-11-11 MED ORDER — ALPRAZOLAM 0.5 MG PO TABS
0.5000 mg | ORAL_TABLET | Freq: Two times a day (BID) | ORAL | Status: DC | PRN
  Administered 2016-11-11 – 2016-11-17 (×8): 0.5 mg via ORAL
  Filled 2016-11-11 (×9): qty 1

## 2016-11-11 MED ORDER — POTASSIUM CHLORIDE 2 MEQ/ML IV SOLN
30.0000 meq | Freq: Once | INTRAVENOUS | Status: AC
  Administered 2016-11-11: 30 meq via INTRAVENOUS
  Filled 2016-11-11: qty 15

## 2016-11-11 MED ORDER — ASPIRIN EC 81 MG PO TBEC
81.0000 mg | DELAYED_RELEASE_TABLET | Freq: Every day | ORAL | Status: DC
  Administered 2016-11-11 – 2016-11-17 (×7): 81 mg via ORAL
  Filled 2016-11-11 (×7): qty 1

## 2016-11-11 MED ORDER — ESCITALOPRAM OXALATE 20 MG PO TABS
20.0000 mg | ORAL_TABLET | Freq: Every day | ORAL | Status: DC
  Administered 2016-11-11 – 2016-11-16 (×6): 20 mg via ORAL
  Filled 2016-11-11 (×3): qty 1
  Filled 2016-11-11 (×2): qty 2
  Filled 2016-11-11: qty 1

## 2016-11-11 MED ORDER — BOOST / RESOURCE BREEZE PO LIQD
1.0000 | Freq: Three times a day (TID) | ORAL | Status: DC
  Administered 2016-11-11 – 2016-11-13 (×4): 1 via ORAL

## 2016-11-11 NOTE — Progress Notes (Signed)
Advanced Heart Failure Rounding Note   Subjective:    40 y/o woman with HTN, CAD and tobacco use. Underwent stenting of LCX in 1/17. Readmitted for NSTEMI. Found to have 2v CAD with high-grade ostial LAD and LCX stensosis. Underwent CABG with Dr. Cornelius Moras with LIMA to LAD, SVG to DIAG and SVG to LCX. Developed VF arrest and had re-do sternotomy begun at bedside with cardia massage. 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 Underwent re-do CABG x1 with SVG to OM and placement of biVAD support due to shock.   Went to OR 11/24  for explantation of bivads and chest closure. Did well.  Yesterday extubated. Remains on milrinone 0.1 mcg.   Denies SOB. Pain controlled with po meds.     Objective:   Weight Range:  Vital Signs:   Temp:  [97.6 F (36.4 C)-100.6 F (38.1 C)] 97.6 F (36.4 C) (11/28 0700) Pulse Rate:  [64-141] 118 (11/28 0800) Resp:  [17-38] 28 (11/28 0800) BP: (87-129)/(58-91) 117/89 (11/28 0800) SpO2:  [63 %-100 %] 95 % (11/28 0800) FiO2 (%):  [40 %] 40 % (11/27 1055) Weight:  [162 lb 11.2 oz (73.8 kg)] 162 lb 11.2 oz (73.8 kg) (11/28 0500) Last BM Date: 11/10/16  Weight change: Filed Weights   11/09/16 0545 11/10/16 0445 11/11/16 0500  Weight: 173 lb 1 oz (78.5 kg) 164 lb 0.4 oz (74.4 kg) 162 lb 11.2 oz (73.8 kg)    Intake/Output:   Intake/Output Summary (Last 24 hours) at 11/11/16 0845 Last data filed at 11/11/16 0800  Gross per 24 hour  Intake          2060.69 ml  Output             4867 ml  Net         -2806.31 ml     Physical Exam: General:  NAD in the chair.  HEENT: normal  Neck: supple.RIJ swan.  JVP 10-12 cm.  Cor: Chest closed. Dressing in place Lungs: coarse Abdomen: soft, nontender, +distended. quiet Extremities: warm 2+ edema. L groin sutured with drain Neuro: Alert and oriented x3. Follows commands.   Telemetry: Sinus tach 110-120s  Labs: Basic Metabolic Panel:  Recent Labs Lab 11/04/16 1700  11/07/16 1830 11/08/16 0410 11/08/16 1530   11/08/16 2139 11/09/16 0419 11/09/16 1743 11/10/16 0510 11/10/16 1535 11/11/16 0500  NA  --   < >  --  137  --   < > 137 134* 134* 133* 135 139  K  --   < >  --  3.7  --   < > 3.9 3.5 4.0 3.4* 3.5 3.8  CL  --   < >  --  107  --   < > 96* 99*  --  95* 93* 96*  CO2  --   < >  --  24  --   --   --  25  --  26 28 29   GLUCOSE  --   < >  --  122*  --   < > 129* 191* 307* 239* 239* 131*  BUN  --   < >  --  6  --   < > 8 8  --  10 12 16   CREATININE 1.00  < > 0.72 0.64  --   < > 0.70 0.81  --  0.90 0.89 1.04*  CALCIUM  --   < >  --  7.9*  --   --   --  7.9*  --  8.0* 8.5* 9.1  MG 2.2  --  2.6* 2.4 2.0  --   --   --   --   --   --   --   < > = values in this interval not displayed.  Liver Function Tests:  Recent Labs Lab 11/05/16 0215 11/06/16 0400 11/07/16 0400 11/11/16 0500  AST 128* 47* 28 49*  ALT 70* 42 27 35  ALKPHOS 39 38 42 79  BILITOT 1.0 1.5* 1.3* 1.8*  PROT 4.1* 4.7* 5.0* 6.6  ALBUMIN 2.6* 3.2* 3.1* 3.0*   No results for input(s): LIPASE, AMYLASE in the last 168 hours. No results for input(s): AMMONIA in the last 168 hours.  CBC:  Recent Labs Lab 11/08/16 0410 11/08/16 1530  11/08/16 2139 11/09/16 0419 11/09/16 1743 11/10/16 0510 11/11/16 0500  WBC 9.2 11.6*  --   --  12.8*  --  11.2* 14.1*  HGB 11.4* 12.1  < > 11.9* 11.1* 10.2* 11.0* 12.5  HCT 32.5* 36.0  < > 35.0* 33.1* 30.0* 32.8* 36.8  MCV 83.8 85.3  --   --  86.2  --  87.0 86.8  PLT 99* 102*  --   --  87*  --  115* 228  < > = values in this interval not displayed.  Cardiac Enzymes: No results for input(s): CKTOTAL, CKMB, CKMBINDEX, TROPONINI in the last 168 hours.  BNP: BNP (last 3 results) No results for input(s): BNP in the last 8760 hours.  ProBNP (last 3 results) No results for input(s): PROBNP in the last 8760 hours.    Other results:  Imaging: Dg Chest Port 1 View  Result Date: 11/11/2016 CLINICAL DATA:  Atelectasis, chest tube, post CABG EXAM: PORTABLE CHEST 1 VIEW COMPARISON:   11/10/2016 FINDINGS: Bilateral chest tubes remain in place, unchanged. No pneumothorax. Interval removal of endotracheal tube and Swan-Ganz catheter. Placement of right PICC line with the tip in the SVC. Bibasilar atelectasis, slightly improved since prior study. Heart is normal size. Prior CABG appear IMPRESSION: Interval extubation.  Improving bibasilar atelectasis. Electronically Signed   By: Charlett NoseKevin  Dover M.D.   On: 11/11/2016 07:44   Dg Chest Port 1 View  Result Date: 11/10/2016 CLINICAL DATA:  Chest tube. EXAM: PORTABLE CHEST 1 VIEW COMPARISON:  11/09/2016. FINDINGS: Left subclavian line again noted coursing of the left IJ. Endotracheal tube, NG tube, Swan-Ganz catheter, mediastinal drainage and bilateral chest tubes are in stable position. No pneumothorax. IMPRESSION: 1. Left subclavian line again noted coursing up the left IJ . Remaining lines and tubes in stable position. No pneumothorax. 2. Prior CABG. Heart size normal. Mild stable pulmonary venous congestion. 3. Low lung volumes with bibasilar atelectasis. Mild left base infiltrate cannot be excluded . Chest is unchanged from prior exam. Electronically Signed   By: Maisie Fushomas  Register   On: 11/10/2016 07:03     Medications:     Scheduled Medications: . acetaminophen  1,000 mg Oral Q6H  . aspirin EC  81 mg Oral Daily  . [START ON 11/12/2016] atorvastatin  80 mg Oral q1800  . bisacodyl  10 mg Oral Daily   Or  . bisacodyl  10 mg Rectal Daily  . buPROPion  300 mg Oral Daily  . clopidogrel  75 mg Oral Q breakfast  . docusate sodium  200 mg Oral Daily  . enoxaparin (LOVENOX) injection  30 mg Subcutaneous Q24H  . escitalopram  20 mg Oral QHS  . fentaNYL  50 mcg Transdermal Q72H  . furosemide  20 mg  Intravenous Q6H  . metoCLOPramide (REGLAN) injection  10 mg Intravenous Q6H  . metoprolol tartrate  12.5 mg Oral BID  . pantoprazole  40 mg Oral Daily  . piperacillin-tazobactam (ZOSYN)  IV  3.375 g Intravenous Q8H  . potassium chloride (KCL  MULTIRUN) 30 mEq in 265 mL IVPB  30 mEq Intravenous Once  . potassium chloride (KCL MULTIRUN) 30 mEq in 265 mL IVPB  30 mEq Intravenous Once  . sodium chloride flush  10-40 mL Intracatheter Q12H  . sodium chloride flush  3 mL Intravenous Q12H  . vancomycin  1,000 mg Intravenous Q12H    Infusions: . sodium chloride    . sodium chloride 20 mL/hr at 11/11/16 0800  . milrinone 0.1 mcg/kg/min (11/11/16 0800)    PRN Medications: ALPRAZolam, morphine injection, ondansetron (ZOFRAN) IV, oxyCODONE, sodium chloride flush, sodium chloride flush   Assessment:   1. Cardiogenic shock with acute biventricular failure    --s/p removal of bivads 11/24 2. VF arrest 3. CAD s/p CABG with emergent re-do 4. NSTEMI 5. Acute respiratory failure 6. Acute blood loss anemia 7. Hypokalemia 8. Left groin hematoma 8. 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 Inter-op TEE reviewed LVEF likely 40-45% range RV function remains poor.   Co-ox 67%. Stop milrinone. Volume status improved. Continue lasix at current dose per Dr Cornelius Moraswen. Tachycardic today. Start Toprol XL 25 mg daily.     Afebrile again overnight. Continue broad spectrum abx. WBC up to 14. BCx NGTD.  Low threshold to add anti-fungal coverage as needed.  Resume psych meds once extubated.   The patient is critically ill with multiple organ systems failure and requires high complexity decision making for assessment and support, frequent evaluation and titration of therapies, application of advanced monitoring technologies and extensive interpretation of multiple databases.   Critical Care Time devoted to patient care services described in this note is 35 Minutes.   Length of Stay: 11   Amy Clegg NP-C  11/11/2016, 8:45 AM  Advanced Heart Failure Team Pager [phone] (M-F; 7a - 4p)  Please contact [facility] for night-coverage after hours (4p -7a ) and weekends on [email or web address]  Patient  seen with NP, agree with the above note.  Doing well today, extubated.  Milrinone stopped this morning, so far stable.  Repeat co-ox this afternoon.  She is volume overloaded on exam, diuresing well on current Lasix.   Will order echocardiogram.   Started on metoprolol, if tolerates would transition to low dose Coreg.   Marca AnconaDalton Aitana Burry 11/11/2016 2:01 PM

## 2016-11-11 NOTE — Progress Notes (Signed)
Nutrition Follow Up  DOCUMENTATION CODES:   Not applicable  INTERVENTION:    Boost Breeze po TID, each supplement provides 250 kcal and 9 grams of protein  NEW NUTRITION DIAGNOSIS:   Increased nutrient needs related to  (post-op healing) as evidenced by estimated needs, ongoing  GOAL:   Patient will meet greater than or equal to 90% of their needs, progressing  MONITOR:   PO intake, Supplement acceptance, Labs, Weight trends, I & O's  ASSESSMENT:   40 y.o. female s/p CABG x 3 11/20. Coded as soon as she arrived to 2S. Sternotomy done at bedside and she was taken back to the OR for emergent redo CABG x 1.   Patient s/p procedure 11/24: STERNAL WASHOUT WITH STERNAL CLOSURE  REMOVAL OF CENTRIMAG VENTRICULAR ASSIST DEVICE  EVACUATION HEMATOMA LEG (left)  Patient extubated 11/27. Advanced to Clear Liquids this AM >> some nausea and vomiting. Will add oral nutrition supplements to help meet kcal, protein needs. Labs and medications reviewed. CBG's [id number].  Diet Order:  Diet clear liquid Room service appropriate? Yes; Fluid consistency: Thin  Skin:  Wound (see comment) (chest and leg incisions )  Last BM:  11/27  Height:   Ht Readings from Last 1 Encounters:  11/03/16 5\' 2"  (1.575 m)    Weight:   Wt Readings from Last 1 Encounters:  11/11/16 162 lb 11.2 oz (73.8 kg)    Ideal Body Weight:  50 kg  BMI:  Body mass index is 29.76 kg/m.  Estimated Nutritional Needs:    Kcal:  1800-2000  Protein:  90-100 gm  Fluid:  1.8-2.0 L  EDUCATION NEEDS:   No education needs identified at this time  Maureen ChattersKatie Uchenna Rappaport, RD, LDN Pager #: [phone] After-Hours Pager #: [phone]

## 2016-11-11 NOTE — Progress Notes (Addendum)
Pharmacy Antibiotic Note  Christina Morrison is a 40 y.o. female admitted on 10/30/2016 with chest pain now s/p CABG 11/20 w/ emergent redo CABG and biVAD support after code blue and now s/p chest closure, washout, and biVAD explant. Pharmacy has been consulted for vancomycin dosing for sepsis. Vancomycin trough this morning supratherapeutic at 21 and SCr trending up. Blood cultures negative, afebrile, WBC up to 14.  Plan: -Decrease vancomycin to 750mg  IV q12h -Continue Zosyn 3.375g IV q8h per MD -Follow-up renal function, clinical course, LOT, vancomycin trough as indicated  Height: 5\' 2"  (157.5 cm) Weight: 162 lb 11.2 oz (73.8 kg) IBW/kg (Calculated) : 50.1  Temp (24hrs), Avg:99.6 F (37.6 C), Min:97.6 F (36.4 C), Max:100.6 F (38.1 C)   Recent Labs Lab 11/06/16 0900  11/08/16 0410 11/08/16 1530  11/08/16 2139 11/09/16 0419 11/10/16 0510 11/10/16 1535 11/11/16 0450 11/11/16 0500  WBC  --   < > 9.2 11.6*  --   --  12.8* 11.2*  --   --  14.1*  CREATININE  --   < > 0.64  --   < > 0.70 0.81 0.90 0.89  --  1.04*  VANCOTROUGH 15  --   --   --   --   --   --   --   --  21*  --   < > = values in this interval not displayed.  Estimated Creatinine Clearance: 67.7 mL/min (by C-G formula based on SCr of 1.04 mg/dL (H)).    Allergies  Allergen Reactions  . Ranexa [Ranolazine] Nausea Only    Dizziness (only the 1,000 mg dose has this effect)    Antibiotics: Zosyn 11/26>> Vancomycin 11/20 >> Zinacef 11/20 >>11/26   Dose Adjustments: 11/23 VT = 15 mcg/mL on 1g q12h >> no change 11/28 VT = 21 mcg/ml on 1g q12h >> reduce to 750mg  q12h   Microbiology: 11/17 MRSA PCR - negative 11/20 Resp cx - neg 11/26 BCx - ngtd  Thank you for allowing pharmacy to be a part of this patient's care.  Fredonia HighlandMichael Walden Statz, PharmD PGY-1 Pharmacy Resident Pager: [phone] 11/11/2016

## 2016-11-11 NOTE — Progress Notes (Addendum)
[address]       [place] [id number]             [phone]        CARDIOTHORACIC SURGERY PROGRESS NOTE   R4 Days Post-Op Procedure(s) (LRB): STERNAL WASHOUT WITH STERNAL CLOSURE (N/A) TRANSESOPHAGEAL ECHOCARDIOGRAM (TEE) (N/A) REMOVAL OF CENTRIMAG VENTRICULAR ASSIST DEVICE (N/A) EVACUATION HEMATOMA (Left)  Subjective: Looks remarkably good and reports feeling well.  Mild soreness in chest.  Denies SOB.  Some nausea and vomiting overnight but reportedly improved, tolerating clear liquids  Objective: Vital signs: BP Readings from Last 1 Encounters:  11/11/16 121/81   Pulse Readings from Last 1 Encounters:  11/11/16 (!) 123   Resp Readings from Last 1 Encounters:  11/11/16 (!) 29   Temp Readings from Last 1 Encounters:  11/11/16 97.6 F (36.4 C) (Oral)    Hemodynamics: PAP: (16-39)/(4-28) 24/9 CVP:  [3 mmHg-20 mmHg] 9 mmHg CO:  [5.6 L/min] 5.6 L/min CI:  [3.2 L/min/m2] 3.2 L/min/m2  Mixed venous co-ox 67%  Physical Exam:  Rhythm:   Sinus tach  Breath sounds: Scattered rhonchi  Heart sounds:  RRR  Incisions:  Dressings dry, intact  Abdomen:  Soft, non-distended, non-tender  Extremities:  Warm, well-perfused  Chest tubes:  low volume thin serosanguinous output, no air leak    Intake/Output from previous day: 11/27 0701 - 11/28 0700 In: 2196.5 [I.V.:1026.5; NG/GT:90; IV Piggyback:1080] Out: 4767 [Urine:4060; Emesis/NG output:500; Drains:50; Stool:7; Chest Tube:150] Intake/Output this shift: No intake/output data recorded.  Lab Results:  CBC: Recent Labs  11/10/16 0510 11/11/16 0500  WBC 11.2* 14.1*  HGB 11.0* 12.5  HCT 32.8* 36.8  PLT 115* 228    BMET:  Recent Labs  11/10/16 1535 11/11/16 0500  NA 135 139  K 3.5 3.8  CL 93* 96*  CO2 28 29  GLUCOSE 239* 131*  BUN 12 16  CREATININE 0.89 1.04*  CALCIUM 8.5* 9.1     PT/INR:  No results for input(s): LABPROT, INR in the last 72 hours.  CBG (last 3)   Recent  Labs  11/10/16 2311 11/11/16 0450 11/11/16 0725  GLUCAP 114* 138* 137*    ABG    Component Value Date/Time   PHART 7.517 (H) 11/10/2016 1253   PCO2ART 37.3 11/10/2016 1253   PO2ART 69.7 (L) 11/10/2016 1253   HCO3 30.0 (H) 11/10/2016 1253   TCO2 26 11/08/2016 2139   ACIDBASEDEF 3.0 (H) 11/03/2016 1950   O2SAT 67.3 11/11/2016 0450    CXR: PORTABLE CHEST 1 VIEW  COMPARISON:  11/10/2016  FINDINGS: Bilateral chest tubes remain in place, unchanged. No pneumothorax. Interval removal of endotracheal tube and Swan-Ganz catheter. Placement of right PICC line with the tip in the SVC.  Bibasilar atelectasis, slightly improved since prior study. Heart is normal size. Prior CABG appear  IMPRESSION: Interval extubation.  Improving bibasilar atelectasis.   Electronically Signed   By: Charlett NoseKevin  Dover M.D.   On: 11/11/2016 07:44  Assessment/Plan: S/P Procedure(s) (LRB): STERNAL WASHOUT WITH STERNAL CLOSURE (N/A) TRANSESOPHAGEAL ECHOCARDIOGRAM (TEE) (N/A) REMOVAL OF CENTRIMAG VENTRICULAR ASSIST DEVICE (N/A) EVACUATION HEMATOMA (Left)  Overall doing remarkably well Maintaining NSR - sinus tach w/ stable hemodynamics off all drips except milrinone 0.2, co-ox 67% Acute systolic CHF with expected post-op volume excess, diuresing fairly well - I/O's negative 2.6 liters yesterday and weight down 2 lbs  Expected post op acute blood loss anemia, Hgb up 12.5 Post op thrombocytopenia, resolved, platelet count up to 228k Hypokalemia, induced by  loop diuretics Low grade fevers seem to have resolved after old lines removed, WBC up slightly 14k   Mobilize  D/C tubes  Start low dose metoprolol and continue to wean milrinone off  ASA and Plavix  Lovenox for DVT prophylaxis  Stop lasix drip but continue intermittent lasix  Supplement potassium  Advance diet slowly as tolerated   Purcell Nailslarence H Najiyah Paris, MD 11/11/2016 7:53 AM

## 2016-11-11 NOTE — Progress Notes (Signed)
CT surgery p.m. Rounds  Patient sitting up in chair eating dinner Patient earlier walking hallway Showing gradual improvement Temperature curve remains normal today

## 2016-11-12 ENCOUNTER — Other Ambulatory Visit (HOSPITAL_COMMUNITY): Payer: Medicaid Other

## 2016-11-12 ENCOUNTER — Inpatient Hospital Stay (HOSPITAL_COMMUNITY): Payer: Medicaid Other

## 2016-11-12 LAB — CBC
HCT: 35.6 % — ABNORMAL LOW (ref 36.0–46.0)
HEMOGLOBIN: 11.8 g/dL — AB (ref 12.0–15.0)
MCH: 29.1 pg (ref 26.0–34.0)
MCHC: 33.1 g/dL (ref 30.0–36.0)
MCV: 87.9 fL (ref 78.0–100.0)
Platelets: 305 10*3/uL (ref 150–400)
RBC: 4.05 MIL/uL (ref 3.87–5.11)
RDW: 14.5 % (ref 11.5–15.5)
WBC: 12.3 10*3/uL — ABNORMAL HIGH (ref 4.0–10.5)

## 2016-11-12 LAB — BASIC METABOLIC PANEL
ANION GAP: 12 (ref 5–15)
BUN: 18 mg/dL (ref 6–20)
CALCIUM: 9 mg/dL (ref 8.9–10.3)
CO2: 28 mmol/L (ref 22–32)
Chloride: 99 mmol/L — ABNORMAL LOW (ref 101–111)
Creatinine, Ser: 0.93 mg/dL (ref 0.44–1.00)
GFR calc non Af Amer: >60 mL/min (ref >60)
Glucose, Bld: 119 mg/dL — ABNORMAL HIGH (ref 65–99)
POTASSIUM: 3.1 mmol/L — AB (ref 3.5–5.1)
Sodium: 139 mmol/L (ref 135–145)

## 2016-11-12 LAB — GLUCOSE, CAPILLARY
GLUCOSE-CAPILLARY: 124 mg/dL — AB (ref 65–99)
Glucose-Capillary: 137 mg/dL — ABNORMAL HIGH (ref 65–99)
Glucose-Capillary: 99 mg/dL (ref 65–99)

## 2016-11-12 LAB — COOXEMETRY PANEL
CARBOXYHEMOGLOBIN: 2.2 % — AB (ref 0.5–1.5)
Methemoglobin: 0.9 % (ref 0.0–1.5)
O2 Saturation: 62.8 %
Total hemoglobin: 12.2 g/dL (ref 12.0–16.0)

## 2016-11-12 MED ORDER — FUROSEMIDE 10 MG/ML IJ SOLN
20.0000 mg | Freq: Four times a day (QID) | INTRAMUSCULAR | Status: AC
  Administered 2016-11-12 (×3): 20 mg via INTRAVENOUS
  Filled 2016-11-12 (×3): qty 2

## 2016-11-12 MED ORDER — FUROSEMIDE 40 MG PO TABS
40.0000 mg | ORAL_TABLET | Freq: Every day | ORAL | Status: DC
  Administered 2016-11-13: 40 mg via ORAL
  Filled 2016-11-12: qty 1

## 2016-11-12 MED ORDER — SODIUM CHLORIDE 0.9 % IV SOLN: 250.0000 mL | INTRAVENOUS | Status: DC | PRN

## 2016-11-12 MED ORDER — SODIUM CHLORIDE 0.9% FLUSH: 3.0000 mL | Freq: Two times a day (BID) | INTRAVENOUS | Status: DC

## 2016-11-12 MED ORDER — SODIUM CHLORIDE 0.9% FLUSH: 3.0000 mL | INTRAVENOUS | Status: DC | PRN

## 2016-11-12 MED ORDER — POTASSIUM CHLORIDE CRYS ER 20 MEQ PO TBCR
40.0000 meq | EXTENDED_RELEASE_TABLET | Freq: Two times a day (BID) | ORAL | Status: DC
  Administered 2016-11-12 – 2016-11-17 (×11): 40 meq via ORAL
  Filled 2016-11-12 (×11): qty 2

## 2016-11-12 MED ORDER — MOVING RIGHT ALONG BOOK
Freq: Once | Status: AC
  Administered 2016-11-12: 09:00:00
  Filled 2016-11-12: qty 1

## 2016-11-12 MED ORDER — SPIRONOLACTONE 25 MG PO TABS
25.0000 mg | ORAL_TABLET | Freq: Every day | ORAL | Status: DC
  Administered 2016-11-12 – 2016-11-17 (×6): 25 mg via ORAL
  Filled 2016-11-12 (×6): qty 1

## 2016-11-12 MED ORDER — SODIUM CHLORIDE 0.9 % IV SOLN
30.0000 meq | Freq: Once | INTRAVENOUS | Status: AC
  Administered 2016-11-12: 30 meq via INTRAVENOUS
  Filled 2016-11-12: qty 15

## 2016-11-12 NOTE — Progress Notes (Addendum)
[address]       [place] [id number]             [phone]        CARDIOTHORACIC SURGERY PROGRESS NOTE   R5 Days Post-Op Procedure(s) (LRB): STERNAL WASHOUT WITH STERNAL CLOSURE (N/A) TRANSESOPHAGEAL ECHOCARDIOGRAM (TEE) (N/A) REMOVAL OF CENTRIMAG VENTRICULAR ASSIST DEVICE (N/A) EVACUATION HEMATOMA (Left)  Subjective: Looks great.  Denies SOB.  Stomach improved, now tolerating food  Objective: Vital signs: BP Readings from Last 1 Encounters:  11/12/16 126/85   Pulse Readings from Last 1 Encounters:  11/12/16 96   Resp Readings from Last 1 Encounters:  11/12/16 (!) 21   Temp Readings from Last 1 Encounters:  11/12/16 97.9 F (36.6 C) (Oral)    Hemodynamics:   Mixed venous co-ox 62.8%   Physical Exam:  Rhythm:   sinus  Breath sounds: clear  Heart sounds:  RRR  Incisions:  Dressings dry, intact  Abdomen:  Soft, non-distended, non-tender  Extremities:  Warm, well-perfused    Intake/Output from previous day: 11/28 0701 - 11/29 0700 In: 2744.6 [P.O.:960; I.V.:489.6; IV Piggyback:1295] Out: 920 [Urine:870; Drains:10; Chest Tube:40] Intake/Output this shift: Total I/O In: 20 [I.V.:20] Out: -   Lab Results:  CBC: Recent Labs  11/11/16 0500 11/12/16 0520  WBC 14.1* 12.3*  HGB 12.5 11.8*  HCT 36.8 35.6*  PLT 228 305    BMET:  Recent Labs  11/11/16 0500 11/12/16 0520  NA 139 139  K 3.8 3.1*  CL 96* 99*  CO2 29 28  GLUCOSE 131* 119*  BUN 16 18  CREATININE 1.04* 0.93  CALCIUM 9.1 9.0     PT/INR:  No results for input(s): LABPROT, INR in the last 72 hours.  CBG (last 3)   Recent Labs  11/11/16 1131 11/11/16 1541 11/12/16 0728  GLUCAP 142* 108* 124*    ABG    Component Value Date/Time   PHART 7.517 (H) 11/10/2016 1253   PCO2ART 37.3 11/10/2016 1253   PO2ART 69.7 (L) 11/10/2016 1253   HCO3 30.0 (H) 11/10/2016 1253   TCO2 26 11/08/2016 2139   ACIDBASEDEF 3.0 (H) 11/03/2016 1950   O2SAT 62.8 11/12/2016  0530    CXR: PORTABLE CHEST 1 VIEW  COMPARISON:  Portable chest x-ray of November 11, 2016  FINDINGS: The lungs remain mildly hypoinflated. There is an approximately 5% right apical pneumothorax which has appeared since yesterday's study. No left-sided pneumothorax is evident. The chest tubes have been removed as have the mediastinal drains. The cardiac silhouette is mildly enlarged though stable. The retro cardiac region on the left is less dense. There is no pneumothorax or significant pleural effusion. There is some atelectasis adjacent to the tract of the previous right chest tube. The sternal wires appear intact. The PICC line tip projects over the midportion of the SVC.  IMPRESSION: 5% right apical pneumothorax since chest tube removal. Mild improvement in the appearance of the lung bases with a small amount of residual atelectasis. No significant pulmonary edema.  Critical Value/emergent results were called by telephone at the time of interpretation on 11/12/2016 at 7:54 am to [facility],RN,, who verbally acknowledged these results.   Electronically Signed   By: David  SwazilandJordan M.D.   On: 11/12/2016 07:56   Assessment/Plan: S/P Procedure(s) (LRB): STERNAL WASHOUT WITH STERNAL CLOSURE (N/A) TRANSESOPHAGEAL ECHOCARDIOGRAM (TEE) (N/A) REMOVAL OF CENTRIMAG VENTRICULAR ASSIST DEVICE (N/A) EVACUATION HEMATOMA (Left)  Overall doing remarkably well Maintaining NSR - sinus tach w/ stable BP  off all drips co-ox 63% Acutesystolic CHF with expected post-op volume excess, diuresing fairly well - I/O's positive yesterday but weight down 2 lbs and only 2 lbs above pre-op baseline  Expected post op acute blood loss anemia, Hgb 11.8 Hypokalemia, induced by loop diuretics Low grade fevers seem to have resolved after old lines removed, WBC down to 12.3k   Mobilize  Continue low dose metoprolol   ASA and Plavix  Lovenox for DVT prophylaxis  Supplement  potassium  Advance diet slowly as tolerated  Watch left thigh incision closely  Transfer step down  Purcell Nailslarence H Owen, MD 11/12/2016 8:41 AM

## 2016-11-12 NOTE — Progress Notes (Signed)
Advanced Heart Failure Rounding Note   Subjective:    40 y/o woman with HTN, CAD and tobacco use. Underwent stenting of LCX in 1/17. Readmitted for NSTEMI. Found to have 2v CAD with high-grade ostial LAD and LCX stensosis. Underwent CABG with Dr. Cornelius Moraswen with LIMA to LAD, SVG to DIAG and SVG to LCX. Developed VF arrest and had re-do sternotomy begun at bedside with cardia massage. 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 Underwent re-do CABG x1 with SVG to OM and placement of biVAD support due to shock.   Went to OR 11/24  for explantation of bivads and chest closure. Did well.  Walking hall yesterday. Off milrinone.   Denies SOB. Pain controlled with po meds.     Objective:   Weight Range:  Vital Signs:   Temp:  [97.5 F (36.4 C)-98.3 F (36.8 C)] 97.9 F (36.6 C) (11/29 0730) Pulse Rate:  [74-156] 96 (11/29 0800) Resp:  [18-30] 21 (11/29 0800) BP: (100-132)/(76-97) 126/85 (11/29 0800) SpO2:  [92 %-100 %] 100 % (11/29 0800) Weight:  [72.8 kg (160 lb 7.9 oz)] 72.8 kg (160 lb 7.9 oz) (11/29 0500) Last BM Date: 11/12/16  Weight change: Filed Weights   11/10/16 0445 11/11/16 0500 11/12/16 0500  Weight: 74.4 kg (164 lb 0.4 oz) 73.8 kg (162 lb 11.2 oz) 72.8 kg (160 lb 7.9 oz)    Intake/Output:   Intake/Output Summary (Last 24 hours) at 11/12/16 0837 Last data filed at 11/12/16 0800  Gross per 24 hour  Intake           2742.2 ml  Output              820 ml  Net           1922.2 ml     Physical Exam: General:  NAD in the chair.  HEENT: normal  Neck: supple.RIJ swan.  JVP 10-12 cm.  Cor: Chest closed. Dressing in place Lungs: coarse Abdomen: soft, nontender, +distended. quiet Extremities: warm 2+ edema. L groin sutured with drain Neuro: Alert and oriented x3. Follows commands.   Telemetry: Sinus tach 110-120s  Labs: Basic Metabolic Panel:  Recent Labs Lab 11/07/16 1830 11/08/16 0410 11/08/16 1530  11/09/16 0419 11/09/16 1743 11/10/16 0510 11/10/16 1535  11/11/16 0500 11/12/16 0520  NA  --  137  --   < > 134* 134* 133* 135 139 139  K  --  3.7  --   < > 3.5 4.0 3.4* 3.5 3.8 3.1*  CL  --  107  --   < > 99*  --  95* 93* 96* 99*  CO2  --  24  --   --  25  --  26 28 29 28   GLUCOSE  --  122*  --   < > 191* 307* 239* 239* 131* 119*  BUN  --  6  --   < > 8  --  10 12 16 18   CREATININE 0.72 0.64  --   < > 0.81  --  0.90 0.89 1.04* 0.93  CALCIUM  --  7.9*  --   --  7.9*  --  8.0* 8.5* 9.1 9.0  MG 2.6* 2.4 2.0  --   --   --   --   --   --   --   < > = values in this interval not displayed.  Liver Function Tests:  Recent Labs Lab 11/06/16 0400 11/07/16 0400 11/11/16 0500  AST 47* 28 49*  ALT 42 27 35  ALKPHOS 38 42 79  BILITOT 1.5* 1.3* 1.8*  PROT 4.7* 5.0* 6.6  ALBUMIN 3.2* 3.1* 3.0*   No results for input(s): LIPASE, AMYLASE in the last 168 hours. No results for input(s): AMMONIA in the last 168 hours.  CBC:  Recent Labs Lab 11/08/16 1530  11/09/16 0419 11/09/16 1743 11/10/16 0510 11/11/16 0500 11/12/16 0520  WBC 11.6*  --  12.8*  --  11.2* 14.1* 12.3*  HGB 12.1  < > 11.1* 10.2* 11.0* 12.5 11.8*  HCT 36.0  < > 33.1* 30.0* 32.8* 36.8 35.6*  MCV 85.3  --  86.2  --  87.0 86.8 87.9  PLT 102*  --  87*  --  115* 228 305  < > = values in this interval not displayed.  Cardiac Enzymes: No results for input(s): CKTOTAL, CKMB, CKMBINDEX, TROPONINI in the last 168 hours.  BNP: BNP (last 3 results) No results for input(s): BNP in the last 8760 hours.  ProBNP (last 3 results) No results for input(s): PROBNP in the last 8760 hours.    Other results:  Imaging: Dg Chest Port 1 View  Result Date: 11/12/2016 CLINICAL DATA:  Status post CABG 9 days ago with subsequent sternal wound washout and closure 5 days ago. EXAM: PORTABLE CHEST 1 VIEW COMPARISON:  Portable chest x-ray of November 11, 2016 FINDINGS: The lungs remain mildly hypoinflated. There is an approximately 5% right apical pneumothorax which has appeared since  yesterday's study. No left-sided pneumothorax is evident. The chest tubes have been removed as have the mediastinal drains. The cardiac silhouette is mildly enlarged though stable. The retro cardiac region on the left is less dense. There is no pneumothorax or significant pleural effusion. There is some atelectasis adjacent to the tract of the previous right chest tube. The sternal wires appear intact. The PICC line tip projects over the midportion of the SVC. IMPRESSION: 5% right apical pneumothorax since chest tube removal. Mild improvement in the appearance of the lung bases with a small amount of residual atelectasis. No significant pulmonary edema. Critical Value/emergent results were called by telephone at the time of interpretation on 11/12/2016 at 7:54 am to [facility],, who verbally acknowledged these results. Electronically Signed   By: David  [place] M.D.   On: 11/12/2016 07:56   Dg Chest Port 1 View  Result Date: 11/11/2016 CLINICAL DATA:  Atelectasis, chest tube, post CABG EXAM: PORTABLE CHEST 1 VIEW COMPARISON:  11/10/2016 FINDINGS: Bilateral chest tubes remain in place, unchanged. No pneumothorax. Interval removal of endotracheal tube and Swan-Ganz catheter. Placement of right PICC line with the tip in the SVC. Bibasilar atelectasis, slightly improved since prior study. Heart is normal size. Prior CABG appear IMPRESSION: Interval extubation.  Improving bibasilar atelectasis. Electronically Signed   By: Charlett Nose M.D.   On: 11/11/2016 07:44     Medications:     Scheduled Medications: . acetaminophen  1,000 mg Oral Q6H  . aspirin EC  81 mg Oral Daily  . atorvastatin  80 mg Oral q1800  . bisacodyl  10 mg Oral Daily   Or  . bisacodyl  10 mg Rectal Daily  . buPROPion  300 mg Oral Daily  . clopidogrel  75 mg Oral Q breakfast  . docusate sodium  200 mg Oral Daily  . enoxaparin (LOVENOX) injection  30 mg Subcutaneous Q24H  . escitalopram  20 mg Oral QHS  . feeding supplement  1  Container Oral TID BM  . fentaNYL  50 mcg  Transdermal Q72H  . metoCLOPramide (REGLAN) injection  10 mg Intravenous Q6H  . metoprolol tartrate  12.5 mg Oral BID  . pantoprazole  40 mg Oral Daily  . piperacillin-tazobactam (ZOSYN)  IV  3.375 g Intravenous Q8H  . potassium chloride (KCL MULTIRUN) 30 mEq in 265 mL IVPB  30 mEq Intravenous Once  . sodium chloride flush  10-40 mL Intracatheter Q12H  . sodium chloride flush  3 mL Intravenous Q12H  . vancomycin  750 mg Intravenous Q12H    Infusions: . sodium chloride    . sodium chloride 20 mL/hr at 11/12/16 0800    PRN Medications: ALPRAZolam, morphine injection, ondansetron (ZOFRAN) IV, oxyCODONE, sodium chloride flush, sodium chloride flush   Assessment:   1. Cardiogenic shock with acute biventricular failure    --s/p removal of bivads 11/24 2. VF arrest 3. CAD s/p CABG with emergent re-do 4. NSTEMI 5. Acute respiratory failure 6. Acute blood loss anemia 7. Hypokalemia 8. Left groin hematoma 8. 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 Inter-op TEE reviewed LVEF likely 40-45% range RV function remains poor.   Milrinone stopped 11/28. Co-ox 63%. Stop milrinone. Volume status nearing baseline. Now off lasix. Will start spiro. HR improved. Tolerating low-dose b-blocker.     Afebrile overnight. Continue broad spectrum abx. WBC decreasing. BCx NGTD.    Supp K. Repeat echo pending.   Length of Stay: 12   Arvilla MeresBensimhon, Daniel MD 11/12/2016, 8:37 AM  Advanced Heart Failure Team Pager [phone] (M-F; 7a - 4p)  Please contact [facility] for night-coverage after hours (4p -7a ) and weekends on [email or web address]  8:37 AM

## 2016-11-12 NOTE — Progress Notes (Signed)
Anesthesiology Follow-up:  Awake and alert, sitting in chair, neuro intact in good spirits, taking PO well, walking with assistance.  VS: T-36.5 BP 114/78 HR 95 RR 22 O2 Sat 100% on 2L Leslie maintaining SR  Na-139 K- 3.1 BUN/Cr. 18/0.93 glucose- 119 H/H- 11.8/35.6 Platelets- 83305,26000  40 year old female S/P CABG X 2 on 11/03/16 complicated by cardiac arrest in ICU requiring direct cardiac massage in ICU and BIVAD support. Weaned from VAD support on 11/24. Extubated 11/27.  Now making excellent progress, weaned off pressors. No fevers at present.  Christina Morrison

## 2016-11-12 NOTE — Progress Notes (Signed)
1440 transferred to 2W23 via WC, monitor and O2. Placed in chair. RN to receive in room. SCD's with pt.

## 2016-11-12 NOTE — Addendum Note (Signed)
Addendum  created 11/12/16 0707 by Kipp Broodavid Solita Macadam, MD   Sign clinical note

## 2016-11-12 NOTE — Progress Notes (Signed)
Report to 2 W RN 

## 2016-11-13 ENCOUNTER — Inpatient Hospital Stay (HOSPITAL_COMMUNITY): Payer: Medicaid Other

## 2016-11-13 DIAGNOSIS — I509 Heart failure, unspecified: Secondary | ICD-10-CM

## 2016-11-13 LAB — COOXEMETRY PANEL
CARBOXYHEMOGLOBIN: 2 % — AB (ref 0.5–1.5)
METHEMOGLOBIN: 1.1 % (ref 0.0–1.5)
O2 SAT: 43.7 %
TOTAL HEMOGLOBIN: 10.6 g/dL — AB (ref 12.0–16.0)

## 2016-11-13 LAB — CBC
HCT: 33 % — ABNORMAL LOW (ref 36.0–46.0)
Hemoglobin: 10.8 g/dL — ABNORMAL LOW (ref 12.0–15.0)
MCH: 28.8 pg (ref 26.0–34.0)
MCHC: 32.7 g/dL (ref 30.0–36.0)
MCV: 88 fL (ref 78.0–100.0)
PLATELETS: 321 10*3/uL (ref 150–400)
RBC: 3.75 MIL/uL — AB (ref 3.87–5.11)
RDW: 14.4 % (ref 11.5–15.5)
WBC: 13.2 10*3/uL — AB (ref 4.0–10.5)

## 2016-11-13 LAB — BASIC METABOLIC PANEL
ANION GAP: 9 (ref 5–15)
BUN: 14 mg/dL (ref 6–20)
CALCIUM: 8.8 mg/dL — AB (ref 8.9–10.3)
CO2: 27 mmol/L (ref 22–32)
Chloride: 103 mmol/L (ref 101–111)
Creatinine, Ser: 0.77 mg/dL (ref 0.44–1.00)
GLUCOSE: 116 mg/dL — AB (ref 65–99)
Potassium: 3.8 mmol/L (ref 3.5–5.1)
SODIUM: 139 mmol/L (ref 135–145)

## 2016-11-13 MED ORDER — ACETAMINOPHEN 325 MG PO TABS
650.0000 mg | ORAL_TABLET | Freq: Four times a day (QID) | ORAL | Status: DC | PRN
  Administered 2016-11-13 – 2016-11-14 (×4): 650 mg via ORAL
  Filled 2016-11-13 (×4): qty 2

## 2016-11-13 NOTE — Progress Notes (Signed)
  Echocardiogram 2D Echocardiogram has been performed.  Ginelle Bays L Androw 11/13/2016, 1:28 PM

## 2016-11-13 NOTE — Progress Notes (Signed)
Advanced Heart Failure Rounding Note   Subjective:    40 y/o woman with HTN, CAD and tobacco use. Underwent stenting of LCX in 1/17. Readmitted for NSTEMI. Found to have 2v CAD with high-grade ostial LAD and LCX stensosis. Underwent CABG with Dr. Cornelius Moras with LIMA to LAD, SVG to DIAG and SVG to LCX. Developed VF arrest and had re-do sternotomy begun at bedside with cardia massage. 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 Underwent re-do CABG x1 with SVG to OM and placement of biVAD support due to shock.   Went to OR 11/24  for explantation of bivads and chest closure. Did well.  Off inotropes. Feeling much better. Walking halls.   Denies SOB. Pain controlled with po meds.   Echo from today (viewed personally)  EF 50-55% RV not seen very well but look normal    Objective:   Weight Range:  Vital Signs:   Temp:  [97.6 F (36.4 C)-98.4 F (36.9 C)] 97.6 F (36.4 C) (11/30 1300) Pulse Rate:  [90-106] 90 (11/30 1300) Resp:  [18-22] 18 (11/30 1300) BP: (97-118)/(64-83) 97/80 (11/30 1300) SpO2:  [95 %-100 %] 95 % (11/30 1300) Weight:  [73.1 kg (161 lb 3.2 oz)] 73.1 kg (161 lb 3.2 oz) (11/30 0512) Last BM Date: 11/12/16  Weight change: Filed Weights   11/11/16 0500 11/12/16 0500 11/13/16 0512  Weight: 73.8 kg (162 lb 11.2 oz) 72.8 kg (160 lb 7.9 oz) 73.1 kg (161 lb 3.2 oz)    Intake/Output:   Intake/Output Summary (Last 24 hours) at 11/13/16 1829 Last data filed at 11/13/16 1200  Gross per 24 hour  Intake              220 ml  Output              210 ml  Net               10 ml     Physical Exam: General:  NAD in the bed  HEENT: normal  Neck: supple.RIJ swan.  JVP 10-12 cm.  Cor: RRR Lungs: coarse Abdomen: soft, nontender, +distended. quiet Extremities: warm 1-2+ edema. L groin sutured with some skin breakdown and eschar Neuro: Alert and oriented x3. Follows commands.   Telemetry: Sinus  90-100s  Labs: Basic Metabolic Panel:  Recent Labs Lab 11/07/16 1830  11/08/16 0410 11/08/16 1530  11/10/16 0510 11/10/16 1535 11/11/16 0500 11/12/16 0520 11/13/16 0456  NA  --  137  --   < > 133* 135 139 139 139  K  --  3.7  --   < > 3.4* 3.5 3.8 3.1* 3.8  CL  --  107  --   < > 95* 93* 96* 99* 103  CO2  --  24  --   < > 26 28 29 28 27   GLUCOSE  --  122*  --   < > 239* 239* 131* 119* 116*  BUN  --  6  --   < > 10 12 16 18 14   CREATININE 0.72 0.64  --   < > 0.90 0.89 1.04* 0.93 0.77  CALCIUM  --  7.9*  --   < > 8.0* 8.5* 9.1 9.0 8.8*  MG 2.6* 2.4 2.0  --   --   --   --   --   --   < > = values in this interval not displayed.  Liver Function Tests:  Recent Labs Lab 11/07/16 0400 11/11/16 0500  AST 28 49*  ALT 27 35  ALKPHOS 42 79  BILITOT 1.3* 1.8*  PROT 5.0* 6.6  ALBUMIN 3.1* 3.0*   No results for input(s): LIPASE, AMYLASE in the last 168 hours. No results for input(s): AMMONIA in the last 168 hours.  CBC:  Recent Labs Lab 11/09/16 0419 11/09/16 1743 11/10/16 0510 11/11/16 0500 11/12/16 0520 11/13/16 0456  WBC 12.8*  --  11.2* 14.1* 12.3* 13.2*  HGB 11.1* 10.2* 11.0* 12.5 11.8* 10.8*  HCT 33.1* 30.0* 32.8* 36.8 35.6* 33.0*  MCV 86.2  --  87.0 86.8 87.9 88.0  PLT 87*  --  115* 228 305 321    Cardiac Enzymes: No results for input(s): CKTOTAL, CKMB, CKMBINDEX, TROPONINI in the last 168 hours.  BNP: BNP (last 3 results) No results for input(s): BNP in the last 8760 hours.  ProBNP (last 3 results) No results for input(s): PROBNP in the last 8760 hours.    Other results:  Imaging: Dg Chest 2 View  Result Date: 11/13/2016 CLINICAL DATA:  Status post coronary artery bypass grafting. Recent pneumothorax. EXAM: CHEST  2 VIEW COMPARISON:  November 12, 2016 FINDINGS: No pneumothorax is evident currently. There is a right pleural effusion. There is patchy atelectasis in both lung bases as well as in the left upper lobe. Lungs elsewhere clear. Heart size and pulmonary vascularity are normal. No adenopathy. There is atherosclerotic  calcification in the aorta. No evident bone lesions. Central catheter tip is in the superior vena cava. IMPRESSION: No evident pneumothorax. Slight increase in size of right pleural effusion. Patchy atelectasis is noted bilaterally, slightly increased in the left upper lobe and otherwise stable elsewhere. Stable cardiac silhouette. There is aortic atherosclerosis. Electronically Signed   By: Bretta BangWilliam  Woodruff III M.D.   On: 11/13/2016 07:34   Dg Chest Port 1 View  Result Date: 11/12/2016 CLINICAL DATA:  Status post CABG 9 days ago with subsequent sternal wound washout and closure 5 days ago. EXAM: PORTABLE CHEST 1 VIEW COMPARISON:  Portable chest x-ray of November 11, 2016 FINDINGS: The lungs remain mildly hypoinflated. There is an approximately 5% right apical pneumothorax which has appeared since yesterday's study. No left-sided pneumothorax is evident. The chest tubes have been removed as have the mediastinal drains. The cardiac silhouette is mildly enlarged though stable. The retro cardiac region on the left is less dense. There is no pneumothorax or significant pleural effusion. There is some atelectasis adjacent to the tract of the previous right chest tube. The sternal wires appear intact. The PICC line tip projects over the midportion of the SVC. IMPRESSION: 5% right apical pneumothorax since chest tube removal. Mild improvement in the appearance of the lung bases with a small amount of residual atelectasis. No significant pulmonary edema. Critical Value/emergent results were called by telephone at the time of interpretation on 11/12/2016 at 7:54 am to [facility] Ke[facility],RN,, who verbally acknowledged these results. Electronically Signed   By: David  SwazilandJordan M.D.   On: 11/12/2016 07:56     Medications:     Scheduled Medications: . aspirin EC  81 mg Oral Daily  . atorvastatin  80 mg Oral q1800  . bisacodyl  10 mg Oral Daily   Or  . bisacodyl  10 mg Rectal Daily  . buPROPion  300 mg Oral Daily  .  clopidogrel  75 mg Oral Q breakfast  . docusate sodium  200 mg Oral Daily  . enoxaparin (LOVENOX) injection  30 mg Subcutaneous Q24H  . escitalopram  20 mg Oral QHS  . feeding  supplement  1 Container Oral TID BM  . fentaNYL  50 mcg Transdermal Q72H  . furosemide  40 mg Oral Daily  . metoprolol tartrate  12.5 mg Oral BID  . pantoprazole  40 mg Oral Daily  . piperacillin-tazobactam (ZOSYN)  IV  3.375 g Intravenous Q8H  . potassium chloride  40 mEq Oral BID  . sodium chloride flush  10-40 mL Intracatheter Q12H  . sodium chloride flush  3 mL Intravenous Q12H  . sodium chloride flush  3 mL Intravenous Q12H  . spironolactone  25 mg Oral Daily  . vancomycin  750 mg Intravenous Q12H    Infusions: . sodium chloride    . sodium chloride 20 mL/hr at 11/13/16 0327    PRN Medications: sodium chloride, acetaminophen, ALPRAZolam, morphine injection, ondansetron (ZOFRAN) IV, oxyCODONE, sodium chloride flush, sodium chloride flush, sodium chloride flush   Assessment:   1. Cardiogenic shock with acute biventricular failure    --s/p removal of bivads 11/24 2. VF arrest 3. CAD s/p CABG with emergent re-do 4. NSTEMI 5. Acute respiratory failure 6. Acute blood loss anemia 7. Hypokalemia 8. Left groin hematoma 8. 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 Echo from today (viewed personally)  EF 50-55% RV not seen very well but look normal  Milrinone stopped 11/28. Co-ox 43% today. Will repeat. As she is feeling well and echo looks good will not restart milrinone currently. Continue spiro. Tolerating low-dose b-blocker for now.   Will need wound care. Continue broad spectrum abx. WBC decreasing. BCx NGTD.    Continue to ambulate.  Length of Stay: 13   Arvilla MeresBensimhon, Oliviarose Punch MD 11/13/2016, 6:29 PM  Advanced Heart Failure Team Pager [phone] (M-F; 7a - 4p)  Please contact [facility] for night-coverage after hours (4p -7a ) and  weekends on [email or web address]  6:29 PM

## 2016-11-13 NOTE — Progress Notes (Signed)
      [address]       [place] [id number]             [phone]      6 Days Post-Op Procedure(s) (LRB): STERNAL WASHOUT WITH STERNAL CLOSURE (N/A) TRANSESOPHAGEAL ECHOCARDIOGRAM (TEE) (N/A) REMOVAL OF CENTRIMAG VENTRICULAR ASSIST DEVICE (N/A) EVACUATION HEMATOMA (Left)   Subjective:  [name] is having a lot of pain this morning.  She states her tylenol was discontinued and she hasn't wanted to take narcotics.  She is ambulating some.  Objective: Vital signs in last 24 hours: Temp:  [97.4 F (36.3 C)-98.4 F (36.9 C)] 98.4 F (36.9 C) (11/30 0512) Pulse Rate:  [88-106] 106 (11/30 0512) Cardiac Rhythm: Normal sinus rhythm (11/29 2110) Resp:  [17-25] 18 (11/30 0512) BP: (89-123)/(51-86) 109/64 (11/30 0512) SpO2:  [92 %-100 %] 95 % (11/30 0512) Weight:  [161 lb 3.2 oz (73.1 kg)] 161 lb 3.2 oz (73.1 kg) (11/30 0512)  Intake/Output from previous day: 11/29 0701 - 11/30 0700 In: 820 [P.O.:720; I.V.:50; IV Piggyback:50] Out: 254 [Urine:252; Stool:2]  General appearance: alert, cooperative and no distress Heart: regular rate and rhythm Lungs: coarse breath sounds, clears with cough Abdomen: soft, non-tender; bowel sounds normal; no masses,  no organomegaly Extremities: edema improving, hematoma stable, skin breakdown present Wound: sternotomy with staples in place, EVH sites clean and dry  Lab Results:  Recent Labs  11/12/16 0520 11/13/16 0456  WBC 12.3* 13.2*  HGB 11.8* 10.8*  HCT 35.6* 33.0*  PLT 305 321   BMET:  Recent Labs  11/12/16 0520 11/13/16 0456  NA 139 139  K 3.1* 3.8  CL 99* 103  CO2 28 27  GLUCOSE 119* 116*  BUN 18 14  CREATININE 0.93 0.77  CALCIUM 9.0 8.8*    PT/INR: No results for input(s): LABPROT, INR in the last 72 hours. ABG    Component Value Date/Time   PHART 7.517 (H) 11/10/2016 1253   HCO3 30.0 (H) 11/10/2016 1253   TCO2 26 11/08/2016 2139   ACIDBASEDEF 3.0 (H) 11/03/2016 1950   O2SAT 62.8 11/12/2016  0530   CBG (last 3)   Recent Labs  11/12/16 0728 11/12/16 1154 11/12/16 1611  GLUCAP 124* 137* 99    Assessment/Plan: S/P Procedure(s) (LRB): STERNAL WASHOUT WITH STERNAL CLOSURE (N/A) TRANSESOPHAGEAL ECHOCARDIOGRAM (TEE) (N/A) REMOVAL OF CENTRIMAG VENTRICULAR ASSIST DEVICE (N/A) EVACUATION HEMATOMA (Left)  1. CV- Cardiogenic shock with Bi Ventricular failure- off drips, vads removed 11/24, on Lopressor, AHF following 2. Pulm- no acute issues, continue IS 3. Renal- creatinine WNL, hypokalemia improved at 3.8, diuresing well, weight improving- on Lasix, Spironolactone per HF 4. Hematoma- LLE, stable in size, early skin breakdown present- redressed wound, staples in place.. Per HF, his NP will look at wound and help with care  5. Pain control- will resume Tylenol, continue narcotics prn 6. Dispo- patient stable off drips, wound care for left thigh, continue diuresis   LOS: 13 days    Kodi Steil 11/13/2016

## 2016-11-13 NOTE — Progress Notes (Signed)
CARDIAC REHAB PHASE I   PRE:  Rate/Rhythm: 88 SR  BP:  Sitting: 100/80        SaO2: 100 RA  MODE:  Ambulation: 150 ft   POST:  Rate/Rhythm: 99 SR  BP:  Sitting: 118/77         SaO2: 99 RA  Pt in bed, IV pump noted to be on, however, tubing unhooked from PICC line, RN notified. Pt agreeable to walk. Required verbal cues for sternal precautions. Pt ambulated 150 ft on RA, rolling walker, assist x1, slow, steady gait, tolerated fairly well. Pt c/o L leg pain at surgical/hematoma site, general fatigue, denies any other complaints. Encouraged IS, additional ambulation x2. Pt to bed per pt request after walk, states she thinks she "overdid I yesterday" and would like to rest, call bell within reach. Will follow.   [phone] Joylene GrapesEmily C Haziel Molner, RN, BSN 11/13/2016 2:11 PM

## 2016-11-14 LAB — BASIC METABOLIC PANEL
ANION GAP: 12 (ref 5–15)
BUN: 14 mg/dL (ref 6–20)
CALCIUM: 9 mg/dL (ref 8.9–10.3)
CO2: 23 mmol/L (ref 22–32)
CREATININE: 0.93 mg/dL (ref 0.44–1.00)
Chloride: 102 mmol/L (ref 101–111)
GFR calc Af Amer: >60 mL/min (ref >60)
GLUCOSE: 144 mg/dL — AB (ref 65–99)
Potassium: 4.2 mmol/L (ref 3.5–5.1)
Sodium: 137 mmol/L (ref 135–145)

## 2016-11-14 LAB — CULTURE, BLOOD (ROUTINE X 2)
CULTURE: NO GROWTH
CULTURE: NO GROWTH

## 2016-11-14 LAB — CBC WITH DIFFERENTIAL/PLATELET
BASOS ABS: 0.1 10*3/uL (ref 0.0–0.1)
BASOS PCT: 1 %
EOS ABS: 0.3 10*3/uL (ref 0.0–0.7)
EOS PCT: 2 %
HCT: 31.3 % — ABNORMAL LOW (ref 36.0–46.0)
Hemoglobin: 10.3 g/dL — ABNORMAL LOW (ref 12.0–15.0)
Lymphocytes Relative: 17 %
Lymphs Abs: 2 10*3/uL (ref 0.7–4.0)
MCH: 29 pg (ref 26.0–34.0)
MCHC: 32.9 g/dL (ref 30.0–36.0)
MCV: 88.2 fL (ref 78.0–100.0)
MONO ABS: 0.7 10*3/uL (ref 0.1–1.0)
Monocytes Relative: 6 %
Neutro Abs: 8.7 10*3/uL — ABNORMAL HIGH (ref 1.7–7.7)
Neutrophils Relative %: 74 %
PLATELETS: 366 10*3/uL (ref 150–400)
RBC: 3.55 MIL/uL — ABNORMAL LOW (ref 3.87–5.11)
RDW: 14.4 % (ref 11.5–15.5)
WBC: 11.7 10*3/uL — ABNORMAL HIGH (ref 4.0–10.5)

## 2016-11-14 LAB — GLUCOSE, CAPILLARY: Glucose-Capillary: 107 mg/dL — ABNORMAL HIGH (ref 65–99)

## 2016-11-14 LAB — COOXEMETRY PANEL
Carboxyhemoglobin: 2.5 % — ABNORMAL HIGH (ref 0.5–1.5)
Methemoglobin: 0.9 % (ref 0.0–1.5)
O2 SAT: 52.6 %
Total hemoglobin: 11.6 g/dL — ABNORMAL LOW (ref 12.0–16.0)

## 2016-11-14 LAB — ECHOCARDIOGRAM COMPLETE
HEIGHTINCHES: 62 in
WEIGHTICAEL: 2579.2 [oz_av]

## 2016-11-14 MED ORDER — CEPHALEXIN 500 MG PO CAPS
500.0000 mg | ORAL_CAPSULE | Freq: Four times a day (QID) | ORAL | Status: DC
  Administered 2016-11-14 – 2016-11-17 (×13): 500 mg via ORAL
  Filled 2016-11-14 (×13): qty 1

## 2016-11-14 MED ORDER — FUROSEMIDE 10 MG/ML IJ SOLN
40.0000 mg | Freq: Two times a day (BID) | INTRAMUSCULAR | Status: DC
  Administered 2016-11-14 – 2016-11-15 (×3): 40 mg via INTRAVENOUS
  Filled 2016-11-14 (×4): qty 4

## 2016-11-14 MED ORDER — ENSURE ENLIVE PO LIQD
237.0000 mL | Freq: Two times a day (BID) | ORAL | Status: DC
  Administered 2016-11-14 – 2016-11-17 (×6): 237 mL via ORAL

## 2016-11-14 MED ORDER — OXYCODONE HCL 5 MG PO TABS
5.0000 mg | ORAL_TABLET | ORAL | Status: DC | PRN
  Administered 2016-11-14: 5 mg via ORAL
  Administered 2016-11-14: 10 mg via ORAL
  Administered 2016-11-14: 5 mg via ORAL
  Administered 2016-11-14 – 2016-11-15 (×7): 10 mg via ORAL
  Filled 2016-11-14 (×3): qty 2
  Filled 2016-11-14: qty 1
  Filled 2016-11-14 (×2): qty 2
  Filled 2016-11-14: qty 1
  Filled 2016-11-14 (×3): qty 2

## 2016-11-14 NOTE — Progress Notes (Signed)
CARDIAC REHAB PHASE I   PRE:  Rate/Rhythm: 98 SR  BP:  Sitting: Pt already up walking, unable to obtain pre-ambulation BP        SaO2: 96 RA  MODE:  Ambulation: 150 ft   POST:  Rate/Rhythm: 111 ST  BP:  Sitting: 110/80         SaO2:  97 RA  Pt up in room, getting ready to walk, agreeable to walk with cardiac rehab. Pt ambulated 150 ft on RA, IV, hand held assist, fairly steady gait, tolerated well. Pt c/o oozing and pain at location of L thigh wound, states she feels it is somewhat limiting her activity, declined to ambulate farther. Encouraged IS, additional ambulation. Pt to recliner after walk, feet elevated, call bell within reach. Will follow.   [phone] Joylene GrapesEmily C Charleton Deyoung, RN, BSN 11/14/2016 10:49 AM

## 2016-11-14 NOTE — Progress Notes (Addendum)
      [address]       [place] [id number]             [phone]        7 Days Post-Op Procedure(s) (LRB): STERNAL WASHOUT WITH STERNAL CLOSURE (N/A) TRANSESOPHAGEAL ECHOCARDIOGRAM (TEE) (N/A) REMOVAL OF CENTRIMAG VENTRICULAR ASSIST DEVICE (N/A) EVACUATION HEMATOMA (Left)  Subjective: Patient with sternal and right thigh pain.  Objective: Vital signs in last 24 hours: Temp:  [97.6 F (36.4 C)-98.1 F (36.7 C)] 98.1 F (36.7 C) (12/01 0554) Pulse Rate:  [90-102] 99 (12/01 0554) Cardiac Rhythm: Normal sinus rhythm (11/30 2044) Resp:  [18] 18 (12/01 0554) BP: (92-118)/(63-83) 97/64 (12/01 0554) SpO2:  [95 %-99 %] 98 % (12/01 0554)  Pre op weight 71 kg Current Weight  11/13/16 161 lb 3.2 oz (73.1 kg)      Intake/Output from previous day: 11/30 0701 - 12/01 0700 In: 1978.7 [P.O.:580; I.V.:848.7; IV Piggyback:550] Out: 760 [Urine:760]   Physical Exam:  Cardiovascular: RRR Pulmonary: Diminished at bases Abdomen: Soft, non tender, bowel sounds present. Extremities: Mild bilateral lower extremity edema. Right thigh with hematoma, ecchymosis and early skin breakdown. No cellulitis. Wounds: Clean and dry.  No erythema or signs of infection. Staples intact  Lab Results: CBC: Recent Labs  11/12/16 0520 11/13/16 0456  WBC 12.3* 13.2*  HGB 11.8* 10.8*  HCT 35.6* 33.0*  PLT 305 321   BMET:  Recent Labs  11/12/16 0520 11/13/16 0456  NA 139 139  K 3.1* 3.8  CL 99* 103  CO2 28 27  GLUCOSE 119* 116*  BUN 18 14  CREATININE 0.93 0.77  CALCIUM 9.0 8.8*    PT/INR:  Lab Results  Component Value Date   INR 1.38 11/07/2016   INR 1.43 11/07/2016   INR 1.53 11/06/2016   ABG:  INR: Will add last result for INR, ABG once components are confirmed Will add last 4 CBG results once components are confirmed  Assessment/Plan:  1. CV - Previous cardiogenic shock, biventricular failure.HR in the low 100's. On Lopressor 12.5 mg bid, Spironolactone  25 mg daily, and Plavix 75 mg daily. Off Milrinone. Last co ox 43.7. Heart failure following. 2.  Pulmonary - On room air. Encourage incentive spirometer. 3. Volume Overload - On Lasix 40 mg daily 4.  Acute blood loss anemia - H and H yesterday 10.8 and 33. 5. ID-Vanco and Zosyn. Will discuss with Dr. Cornelius Moraswen regarding length of antibiotics. 6. Regarding pain control, she is on Fentanyl patch, Oxy IR PRN, and Morphine IV PRN. Will change Oxy to every 3 hours PRN.  ZIMMERMAN,DONIELLE MPA-C 11/14/2016,7:14 AM   I have seen and examined the patient and agree with the assessment and plan as outlined.  Incisions look okay w/ mild skin edge necrosis on sternal incision, more significant skin edge necrosis mid portion of left thigh incision.  There is some cellulitis surrounding medial aspect of mid portion of left thigh incision w/ possibly compromised skin.  She may ultimately require wound debridement but not at this time.  Will stop broad spectrum antibiotics and treat cellulitis w/ oral Keflex.  Watch incisions closely.  Mobilize.  Diuresis.  Possibly ready for d/c home 3-4 days if wounds stable/improved.  Purcell Nailslarence H Norva Bowe, MD 11/14/2016 9:28 AM

## 2016-11-14 NOTE — Progress Notes (Signed)
Advanced Heart Failure Rounding Note   Subjective:    40 y/o woman with HTN, CAD and tobacco use. Underwent stenting of LCX in 1/17. Readmitted for NSTEMI. Found to have 2v CAD with high-grade ostial LAD and LCX stensosis. Underwent CABG with Dr. Cornelius Moraswen with LIMA to LAD, SVG to DIAG and SVG to LCX. Developed VF arrest and had re-do sternotomy begun at bedside with cardia massage. 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 Underwent re-do CABG x1 with SVG to OM and placement of biVAD support due to shock.   Went to OR 11/24  for explantation of bivads and chest closure. Did well.  Off inotropes. Complaining of chest soreness. Mild dyspnea with exertion. But is otherwise feeling well. Co-ox drawn last night 43%. Awaiting repeat   Echo 11/30  EF 50-55% RV not seen very well but looks normal    Objective:   Weight Range:  Vital Signs:   Temp:  [97.6 F (36.4 C)-98.1 F (36.7 C)] 98.1 F (36.7 C) (12/01 0554) Pulse Rate:  [90-102] 99 (12/01 0554) Resp:  [18] 18 (12/01 0554) BP: (92-118)/(63-83) 97/64 (12/01 0554) SpO2:  [95 %-99 %] 98 % (12/01 0554) Weight:  [161 lb 6 oz (73.2 kg)] 161 lb 6 oz (73.2 kg) (12/01 0700) Last BM Date: 11/12/16  Weight change: Filed Weights   11/12/16 0500 11/13/16 0512 11/14/16 0700  Weight: 160 lb 7.9 oz (72.8 kg) 161 lb 3.2 oz (73.1 kg) 161 lb 6 oz (73.2 kg)    Intake/Output:   Intake/Output Summary (Last 24 hours) at 11/14/16 0747 Last data filed at 11/14/16 0740  Gross per 24 hour  Intake          1978.67 ml  Output             1060 ml  Net           918.67 ml     Physical Exam: General:  NAD in the chair HEENT: normal  Neck: supple. JVP 10-12 cm.  Cor: RRR Lungs: coarse Abdomen: soft, nontender, +distended. quiet Extremities: warm 1-2+ edema. L groin sutured with some skin breakdown and eschar Ecchymotic. RUE PICC Neuro: Alert and oriented x3. Follows commands.   Telemetry: Sinus  90-100s  Labs: Basic Metabolic Panel:  Recent  Labs Lab 11/07/16 1830 11/08/16 0410 11/08/16 1530  11/10/16 0510 11/10/16 1535 11/11/16 0500 11/12/16 0520 11/13/16 0456  NA  --  137  --   < > 133* 135 139 139 139  K  --  3.7  --   < > 3.4* 3.5 3.8 3.1* 3.8  CL  --  107  --   < > 95* 93* 96* 99* 103  CO2  --  24  --   < > 26 28 29 28 27   GLUCOSE  --  122*  --   < > 239* 239* 131* 119* 116*  BUN  --  6  --   < > 10 12 16 18 14   CREATININE 0.72 0.64  --   < > 0.90 0.89 1.04* 0.93 0.77  CALCIUM  --  7.9*  --   < > 8.0* 8.5* 9.1 9.0 8.8*  MG 2.6* 2.4 2.0  --   --   --   --   --   --   < > = values in this interval not displayed.  Liver Function Tests:  Recent Labs Lab 11/11/16 0500  AST 49*  ALT 35  ALKPHOS 79  BILITOT 1.8*  PROT 6.6  ALBUMIN 3.0*   No results for input(s): LIPASE, AMYLASE in the last 168 hours. No results for input(s): AMMONIA in the last 168 hours.  CBC:  Recent Labs Lab 11/09/16 0419 11/09/16 1743 11/10/16 0510 11/11/16 0500 11/12/16 0520 11/13/16 0456  WBC 12.8*  --  11.2* 14.1* 12.3* 13.2*  HGB 11.1* 10.2* 11.0* 12.5 11.8* 10.8*  HCT 33.1* 30.0* 32.8* 36.8 35.6* 33.0*  MCV 86.2  --  87.0 86.8 87.9 88.0  PLT 87*  --  115* 228 305 321    Cardiac Enzymes: No results for input(s): CKTOTAL, CKMB, CKMBINDEX, TROPONINI in the last 168 hours.  BNP: BNP (last 3 results) No results for input(s): BNP in the last 8760 hours.  ProBNP (last 3 results) No results for input(s): PROBNP in the last 8760 hours.    Other results:  Imaging: Dg Chest 2 View  Result Date: 11/13/2016 CLINICAL DATA:  Status post coronary artery bypass grafting. Recent pneumothorax. EXAM: CHEST  2 VIEW COMPARISON:  November 12, 2016 FINDINGS: No pneumothorax is evident currently. There is a right pleural effusion. There is patchy atelectasis in both lung bases as well as in the left upper lobe. Lungs elsewhere clear. Heart size and pulmonary vascularity are normal. No adenopathy. There is atherosclerotic calcification  in the aorta. No evident bone lesions. Central catheter tip is in the superior vena cava. IMPRESSION: No evident pneumothorax. Slight increase in size of right pleural effusion. Patchy atelectasis is noted bilaterally, slightly increased in the left upper lobe and otherwise stable elsewhere. Stable cardiac silhouette. There is aortic atherosclerosis. Electronically Signed   By: Bretta Bang III M.D.   On: 11/13/2016 07:34     Medications:     Scheduled Medications: . aspirin EC  81 mg Oral Daily  . atorvastatin  80 mg Oral q1800  . bisacodyl  10 mg Oral Daily   Or  . bisacodyl  10 mg Rectal Daily  . buPROPion  300 mg Oral Daily  . clopidogrel  75 mg Oral Q breakfast  . docusate sodium  200 mg Oral Daily  . enoxaparin (LOVENOX) injection  30 mg Subcutaneous Q24H  . escitalopram  20 mg Oral QHS  . feeding supplement  1 Container Oral TID BM  . fentaNYL  50 mcg Transdermal Q72H  . furosemide  40 mg Oral Daily  . metoprolol tartrate  12.5 mg Oral BID  . pantoprazole  40 mg Oral Daily  . piperacillin-tazobactam (ZOSYN)  IV  3.375 g Intravenous Q8H  . potassium chloride  40 mEq Oral BID  . sodium chloride flush  10-40 mL Intracatheter Q12H  . sodium chloride flush  3 mL Intravenous Q12H  . sodium chloride flush  3 mL Intravenous Q12H  . spironolactone  25 mg Oral Daily  . vancomycin  750 mg Intravenous Q12H    Infusions: . sodium chloride    . sodium chloride 20 mL/hr at 11/13/16 0327    PRN Medications: sodium chloride, acetaminophen, ALPRAZolam, morphine injection, ondansetron (ZOFRAN) IV, oxyCODONE, sodium chloride flush, sodium chloride flush, sodium chloride flush   Assessment:   1. Cardiogenic shock with acute biventricular failure    --s/p removal of bivads 11/24 2. VF arrest 3. CAD s/p CABG with emergent re-do 4. NSTEMI 5. Acute respiratory failure 6. Acute blood loss anemia 7. Hypokalemia 8. Left groin hematoma   Plan/Discussion:    She has successfully  been weaned from bivad support. Echo 11/13/2016  EF 50-55% RV not seen very  well but look normal  Milrinone stopped 11/28. Labs pending. Mild volume overload. Stop po lasix start IV lasix today. Continue spiro. Tolerating low-dose b-blocker for now.   Will need wound care. Continue broad spectrum abx. WBC pending. BCx NGTD.    Continue to ambulate.  Length of Stay: 14   Amy Clegg NP-C  11/14/2016, 7:47 AM  Advanced Heart Failure Team Pager [phone] (M-F; 7a - 4p)  Please contact [facility] for night-coverage after hours (4p -7a ) and weekends on [email or web address]  7:47 AM   Patient seen and examined with Tonye BecketAmy Clegg, NP. We discussed all aspects of the encounter. I agree with the assessment and plan as stated above.   She continues to improve. Echo reviewed personally and EF looks low normal. Agree with one more day IV lasix. Co-ox low last night. Will repeat this am. Would not restart milrinone yet as she feels well and EF relatively normal. Continue spiro and low-dose b-blocker. Can consider ARB as BP improves.  Mobilize. Wound care seeing. Renal function ok   Lionel Woodberry,MD 9:08 AM

## 2016-11-14 NOTE — Discharge Summary (Signed)
Physician Discharge Summary       [address]       [place] [id number]             [phone]    Patient ID: Christina CascoKelly Morrison MRN: [id number] DOB/AGE: [birth year] 40 y.o.  Admit date: 10/30/2016 Discharge date: 11/17/2016  Admission Diagnoses: 1. Unstable angina pectoris (HCC) 2. Coronary artery disease  Active Diagnoses:  1. Cardiac arrest (HCC) 2. Cardiogenic shock (HCC) 3. Tobacco use disorder 4. Dyslipidemia 5. Hypertension 6. Depression 7. Chronic narcotic use 8. ABL anemia 9. Thrombocytopenia  Consults: Heart failure  Procedure (s):   Coronary Artery Bypass Grafting x 3              Left Internal Mammary Artery to Distal Left Anterior Descending Coronary Artery             Saphenous Vein Graft to Distal Left Circumflex Coronary Artery             Sapheonous Vein Graft to Distal Left Anterior Descending Coronary Artery             Endoscopic Vein Harvest from Right Thigh and Lower Leg by Dr. Cornelius Moraswen on 11/03/2016.   Emergency Redo Sternotomy in Surgical Intensive Care Unit   Redo Coronary Artery Bypass Grafting x 1             Reversed Greater Saphenous Vein Graft to Second Obtuse Marginal Branch of Left Circumflex Coronary Artery             Open Vein Harvest from Left Thigh    Placement of Centrimag Bi-ventricular Assist Device                Placement of Left Femoral Arterial Line by Dr. Cornelius Moraswen, Dr. Laneta SimmersBartle, and Dr. Tyrone SageGerhardt on 11/03/2016.   1. Removal of biventricular assist device pumps and closure of chest. 2. Evacuation of left thigh hematoma by Dr. Donata ClayVan Trigt on 11/07/2016  History of Presenting Illness: This is a 40 year old female with history of coronary artery disease status post PCI and stenting on 2 previous occasions, hyperlipidemia, long-standing tobacco abuse, a strong family history of coronary artery disease, bipolar disorder, history of substance abuse and occluding cocaine use in the remote past, and chronic pain on  long-term oral narcotics who was admitted to the hospital with unstable angina pectoris and has been referred for surgical consultation to discuss treatment options for management of severe multivessel coronary artery disease. The patient's cardiac history dates back to April 2016 when she presented with an acute non-ST segment elevation myocardial infarction that occurred in the context of recent cocaine use. She was treated with bare-metal stenting to a diagonal branch of the left anterior descending coronary artery. She subsequently developed chronic chest pain and has undergone multiple cardiac catheterization studies despite the fact that she quit using illicit drugs other than long-term oral narcotics and tobacco. In January 2017 she presented with a second non-ST segment elevation myocardial infarction and was treated with overlapping drug-eluting stents in the left circumflex coronary artery for progressive obstructive disease.  The patient states that she did fairly well after her last stenting procedure in January of this year and she was last seen in follow-up by Dr. Excell Seltzerooper on 05/20/2016. Approximately 5 days ago the patient developed sudden onset recurrent substernal chest pain radiating to the neck that was similar to previous episodes but according to the patient's somewhat more severe. Initially symptoms were alleviated by nitroglycerin, but the  patient's symptoms continued to recur even when she was at rest or trying to sleep at night.  She was readmitted to the hospital 10/30/2016 for accelerating recurrent symptoms of substernal chest pain. She was pain free on arrival and EKG was without significant ST segment elevation. Serial troponin levels have been flat ranging from 0.03 to 0.09 at a maximum. She has continued to have intermittent episodes of chest pain for which she is currently on intravenous nitroglycerin, heparin, and tirofiban. She underwent diagnostic cardiac catheterization yesterday  evening by Dr. Okey Dupre and was found to have significant progression of disease with 70% ostial stenosis of the left anterior descending coronary artery that was confirmed using IVUS and 70% ostial in-stent restenosis of the left circumflex coronary artery. There remained patent stent in the diagonal branch with very mild in-stent restenosis. There remained unchanged ostial stenosis of a small to medium-sized first OM branch. There is no significant disease in the right coronary artery territory. Cardiothoracic surgical consultation was requested to discuss treatment options. Plavix was discontinued after catheterization and tirofiban was started.  The patient is single and lives locally in [place] with her fianc. She has 2 adult children, one of whom lives with her. She is currently out of work. She reports no significant physical limitations other than that related to some chronic back pain for which she takes oxycodone on a daily basis. Prior to 4 days ago she reported only occasional episodes of substernal chest discomfort. She is currently pain-free but she has had a few brief repeat episodes of substernal chest pain today that were relieved with nitroglycerin. She reports mild exertional shortness of breath at baseline. She denies any recent acute exacerbations of shortness of breath, PND, orthopnea, or lower extremity edema. She continues to smoke cigarettes although she states that she has cut back. She states that she has not used any cocaine or other illicit drugs since her heart attack in 2016.  Urine tox screen was positive for amphetamines at the time of admission.  Dr. Cornelius Moras reviewed the indications, risks, and potential benefits of coronary artery bypass grafting with the patient and her fianc at the bedside.  Alternative treatment strategies have been discussed, including the relative risks, benefits and long term prognosis associated with medical therapy, percutaneous coronary intervention, and  surgical revascularization.  The patient understands and accepts all potential associated risks of surgery. She underwent a CABG x 3 on 11/03/2016.  Brief Hospital Course:  She unfortunately developed hypotension without any change in heart rhythm or other signs to suggest ongoing myocardial ischemia later in the ICU. Attempts to treat hypotension with volume and vasopressors were not fruitful and the patient developed pulseless electrical activity. CPR was immediately begun. The patient was resuscitated with intravenous pressors including epinephrine.  Despite good pulse with CPR, the patient's blood pressure remained refractory to resuscitation using volume and repeat doses of epinephrine and sodium bicarbonate. She underwent an emergent opening of the sternotomy in the ICU. The patient developed ventricular fibrillation and was cardioverted using epicardial paddles. A regular narrow complex rhythm resumed but the patient remained in cardiogenic shock with no sustainable blood pressure. Preparations were made to return directly to the operating room.  She then underwent redo coronary artery bypass grafting surgery x 1 using greater saphenous vein from the left thigh via open harvest. She also had a centri mag biventricular assist device places as well as a left femoral arterial line.   Post op, she was following commands and moving all  4 extremities. She was transfused several times post op. She was diuresed accordingly. She also had thrombocytopenia and was given platelets. HIT was obtained. She was on a Heparin drip initially and this was changed to Bivalirudin. She was also on Milrinone  And Epi drips and Nitric Oxide. Heart failure followed her post op.   She then underwent removal of biventricular assist device pump, closure of sternum, and evacuation of left thigh hematoma. Intra op TEE showed +1 MR, trace AI, no regional wall abnormalities, and LVEF 50-55%. She remained intubated and sedated. At  this point, she had massive volume overload and was aggressively diuresed with Lasix drip. She was weaned off the Epinephrine drip. She developed a fever and was put on broad spectrum antibiotics. Central line was removed and PICC was placed. She was extubated on 11/27. She was started on clear liquids and her diet was gradually increased. She was started on low dose Lopressor on 11/28. She was continued on Milrinone and co ox was monitored. Her H and H stabilized. She was started on a baby ecasa and Plavix 75 mg daily. She was felt surgically stable for transfer from the ICU to [facility] on 11/12/2016. She is ambulating on room air. She is tolerating a diet and has had a bowel movement. Sternal incision is clean and dry and staples are intact. RLE wounds are clean and dry. She still has ecchymosis and a hematoma left thigh with a small area of skin necrosis. Also, edges of sternal wound have necrosis. She was on IV Vanco and Zosyn for an extended period of time. Antibiotic was changed to Keflex on Friday 12/01. She did have issues with pain control. Her Oxy IR was changed to every 3 hours PRN and IV Morphine for breakthrough pain was changed to Dilaudid. Her incisional pain was under better control. Co ox was obtained this afternoon and is 56.4. As discussed with heart failure, continue current heart medications and decrease Lasix to 40 mg daily. Patient instructed if she has dizziness or weight continues to decrease, stop Lasix. Her last potassium was up to 5.1 so she will not be given a potassium supplement (also on Spironolactone). She is felt surgically stable for discharge today.  Latest Vital Signs: Blood pressure 108/72, pulse 94, temperature 97.6 F (36.4 C), temperature source Oral, resp. rate 20, height 5\' 2"  (1.575 m), weight 154 lb 12.8 oz (70.2 kg), last menstrual period 10/27/2016, SpO2 98 %.  Physical Exam: Cardiovascular: RRR Pulmonary: Diminished at bases Abdomen: Soft, non tender, bowel sounds  present. Extremities: Mild bilateral lower extremity edema. Right thigh with hematoma, ecchymosis and early skin breakdown. No cellulitis. Wounds: Clean and dry.  No erythema or signs of infection. Staples intact  Discharge Condition:Stable and discharged to home.  Recent laboratory studies:  Lab Results  Component Value Date   WBC 9.4 11/17/2016   HGB 11.0 (L) 11/17/2016   HCT 33.8 (L) 11/17/2016   MCV 89.2 11/17/2016   PLT 538 (H) 11/17/2016   Lab Results  Component Value Date   NA 135 11/17/2016   K 5.1 11/17/2016   CL 100 (L) 11/17/2016   CO2 23 11/17/2016   CREATININE 0.83 11/17/2016   GLUCOSE 103 (H) 11/17/2016    Diagnostic Studies:  Dg Chest 2 View  Result Date: 11/13/2016 CLINICAL DATA:  Status post coronary artery bypass grafting. Recent pneumothorax. EXAM: CHEST  2 VIEW COMPARISON:  November 12, 2016 FINDINGS: No pneumothorax is evident currently. There is a right pleural effusion. There  is patchy atelectasis in both lung bases as well as in the left upper lobe. Lungs elsewhere clear. Heart size and pulmonary vascularity are normal. No adenopathy. There is atherosclerotic calcification in the aorta. No evident bone lesions. Central catheter tip is in the superior vena cava. IMPRESSION: No evident pneumothorax. Slight increase in size of right pleural effusion. Patchy atelectasis is noted bilaterally, slightly increased in the left upper lobe and otherwise stable elsewhere. Stable cardiac silhouette. There is aortic atherosclerosis. Electronically Signed   By: Bretta Bang III M.D.   On: 11/13/2016 07:34    Discharge Instructions    Amb Referral to Cardiac Rehabilitation    Complete by:  As directed    Diagnosis:  CABG   CABG X ___:  3      Discharge Medications:   Medication List    STOP taking these medications   doxepin 10 MG capsule Commonly known as:  SINEQUAN   isosorbide mononitrate 30 MG 24 hr tablet Commonly known as:  IMDUR   nitroGLYCERIN 0.4  MG SL tablet Commonly known as:  NITROSTAT   ranolazine 500 MG 12 hr tablet Commonly known as:  RANEXA   VYVANSE 40 MG capsule Generic drug:  lisdexamfetamine     TAKE these medications   acetaminophen 325 MG tablet Commonly known as:  TYLENOL Take 2 tablets (650 mg total) by mouth every 6 (six) hours as needed for mild pain or headache. What changed:  when to take this   ALPRAZolam 0.5 MG tablet Commonly known as:  XANAX Take 0.5 mg by mouth daily.   amLODipine 5 MG tablet Commonly known as:  NORVASC TAKE 1 TABLET BY MOUTH DAILY What changed:  See the new instructions.   aspirin 81 MG EC tablet Take 1 tablet (81 mg total) by mouth daily.   atorvastatin 80 MG tablet Commonly known as:  LIPITOR Take 1 tablet (80 mg total) by mouth daily. What changed:  medication strength  how much to take   buPROPion 300 MG 24 hr tablet Commonly known as:  WELLBUTRIN XL Take 300 mg by mouth every morning. What changed:  Another medication with the same name was removed. Continue taking this medication, and follow the directions you see here.   cephALEXin 500 MG capsule Commonly known as:  KEFLEX Take 1 capsule (500 mg total) by mouth every 6 (six) hours.   clopidogrel 75 MG tablet Commonly known as:  PLAVIX TAKE 1 TABLET BY MOUTH DAILY WITH BREAKFAST What changed:  See the new instructions.   escitalopram 20 MG tablet Commonly known as:  LEXAPRO Take 20 mg by mouth daily.   fluticasone 50 MCG/ACT nasal spray Commonly known as:  FLONASE Place 2 sprays into both nostrils daily as needed for allergies or rhinitis.   furosemide 40 MG tablet Commonly known as:  LASIX Take 1 tablet (40 mg total) by mouth daily. If dizzy or weight continues to decrease, please stop.   metoprolol tartrate 25 MG tablet Commonly known as:  LOPRESSOR Take 0.5 tablets (12.5 mg total) by mouth 2 (two) times daily.   ondansetron 4 MG disintegrating tablet Commonly known as:  ZOFRAN-ODT Take 4 mg  by mouth daily as needed for nausea or vomiting. DISSOLVE   oxyCODONE 5 MG immediate release tablet Commonly known as:  Oxy IR/ROXICODONE Take 1-2 tablets (5-10 mg total) by mouth every 4 (four) hours as needed for severe pain. What changed:  medication strength  how much to take  when to take this  reasons to take this   pantoprazole 40 MG tablet Commonly known as:  PROTONIX TAKE 1 TABLET BY MOUTH TWICE A DAY BEFORE MEALS What changed:  See the new instructions.   spironolactone 25 MG tablet Commonly known as:  ALDACTONE Take 1 tablet (25 mg total) by mouth daily. Start taking on:  11/18/2016     The patient has been discharged on:   1.Beta Blocker:  Yes [  x ]                              No   [   ]                              If No, reason:  2.Ace Inhibitor/ARB: Yes [   ]                                     No  [  x  ]                                     If No, reason:Labile BP  3.Statin:   Yes [ x  ]                  No  [   ]                  If No, reason:  4.Ecasa:  Yes  [ x  ]                  No   [   ]                  If No, reason:  Follow Up Appointments: Follow-up Information    Purcell Nailslarence H Owen, MD Follow up on 11/24/2016.   Specialty:  Cardiothoracic Surgery Why:  PA/LAT CXR [facility] be taken ([facility] which is in the same building as Dr. Orvan Julywen's office) on 11/24/2017 at 1:30 pm;Appointment time is at 2:00 pm Contact information: [address] [place] [address] [phone]        [facility] Follow up on 11/26/2016.   Specialty:  Cardiology Why:  at 0900 am for post hospital follow up. Please bring all of your medications to your visit. The code for parking is 0040. Enter through the construction zone off of [address]. Parking garage is below ground on your right.  Contact information: [address] [id number] mc [place] [id number] [phone]            Signed: Doree FudgeZIMMERMAN,DONIELLE MPA-C 11/17/2016, 2:46 PM

## 2016-11-15 LAB — BASIC METABOLIC PANEL
ANION GAP: 13 (ref 5–15)
BUN: 16 mg/dL (ref 6–20)
CO2: 24 mmol/L (ref 22–32)
Calcium: 9.4 mg/dL (ref 8.9–10.3)
Chloride: 101 mmol/L (ref 101–111)
Creatinine, Ser: 0.95 mg/dL (ref 0.44–1.00)
GLUCOSE: 95 mg/dL (ref 65–99)
POTASSIUM: 4.8 mmol/L (ref 3.5–5.1)
Sodium: 138 mmol/L (ref 135–145)

## 2016-11-15 LAB — CBC WITH DIFFERENTIAL/PLATELET
BASOS ABS: 0 10*3/uL (ref 0.0–0.1)
BASOS PCT: 0 %
Eosinophils Absolute: 0.3 10*3/uL (ref 0.0–0.7)
Eosinophils Relative: 2 %
HEMATOCRIT: 32.5 % — AB (ref 36.0–46.0)
HEMOGLOBIN: 10.6 g/dL — AB (ref 12.0–15.0)
LYMPHS PCT: 14 %
Lymphs Abs: 2 10*3/uL (ref 0.7–4.0)
MCH: 28.8 pg (ref 26.0–34.0)
MCHC: 32.6 g/dL (ref 30.0–36.0)
MCV: 88.3 fL (ref 78.0–100.0)
MONO ABS: 0.8 10*3/uL (ref 0.1–1.0)
MONOS PCT: 5 %
NEUTROS ABS: 10.9 10*3/uL — AB (ref 1.7–7.7)
NEUTROS PCT: 79 %
Platelets: 475 10*3/uL — ABNORMAL HIGH (ref 150–400)
RBC: 3.68 MIL/uL — ABNORMAL LOW (ref 3.87–5.11)
RDW: 14.7 % (ref 11.5–15.5)
WBC: 14 10*3/uL — ABNORMAL HIGH (ref 4.0–10.5)

## 2016-11-15 LAB — COOXEMETRY PANEL
CARBOXYHEMOGLOBIN: 2.1 % — AB (ref 0.5–1.5)
METHEMOGLOBIN: 1 % (ref 0.0–1.5)
O2 SAT: 45.5 %
Total hemoglobin: 10.9 g/dL — ABNORMAL LOW (ref 12.0–16.0)

## 2016-11-15 LAB — SEROTONIN RELEASE ASSAY (SRA)
SRA .2 IU/mL UFH Ser-aCnc: 7 % (ref 0–20)
SRA 100IU/mL UFH Ser-aCnc: 4 % (ref 0–20)

## 2016-11-15 MED ORDER — OXYCODONE HCL 5 MG PO TABS
5.0000 mg | ORAL_TABLET | ORAL | Status: DC | PRN
  Administered 2016-11-15 – 2016-11-17 (×15): 15 mg via ORAL
  Filled 2016-11-15 (×16): qty 3

## 2016-11-15 MED ORDER — HYDROMORPHONE HCL 1 MG/ML IJ SOLN
1.0000 mg | INTRAMUSCULAR | Status: DC | PRN
  Administered 2016-11-16: 1 mg via INTRAVENOUS
  Filled 2016-11-15: qty 1

## 2016-11-15 NOTE — Progress Notes (Signed)
Spoke with patient concerning pain medication and safety awareness.  Patient has increased frequency of pain medication to every three hours on the dot (10 mg OxyIR) due to soreness in chest.  Initially, patient did not want to take narcotics and preferred extra strength tylenol. Patient has significant other in room who has COPD and she was trying to help until I asked that she let him help her and not the other way around. This may be contributing to her chest soreness.  Will continue to encourage ambulation as patient is mobile and busy in room. Pt resting with call bell within reach.  Will continue to monitor. Thomas HoffBurton, Robyne Matar McClintock, RN

## 2016-11-15 NOTE — Progress Notes (Signed)
CARDIAC REHAB PHASE I   PRE:  Rate/Rhythm: 107 ST    BP: sitting 114/76    SaO2:   MODE:  Ambulation: 230 ft   POST:  Rate/Rhythm: 122 ST    BP: sitting 109/73     SaO2: 98 RA  Pt c/o lack of sleep but eventually walked independently. She is using her arms to get out of bed, moving around room. Discussed minimizing this could aide in her pain control. Steady pace in hall, however her legs rub together. Encouraged her to try to spread them while walking, even if she needs to use RW to steady more. C/o SOB with distance (went 80 ft farther), rest x2. SaO2 good but HR up. Return to bed, discussed bed mobility. Discussed walking more and IS. Pt agreeable. Asking for pain meds. [phone]   Harriet MassonRandi Kristan Aleks Nawrot CES, ACSM 11/15/2016 11:07 AM

## 2016-11-15 NOTE — Progress Notes (Addendum)
[address]       [place] [id number]             [phone]      8 Days Post-Op Procedure(s) (LRB): STERNAL WASHOUT WITH STERNAL CLOSURE (N/A) TRANSESOPHAGEAL ECHOCARDIOGRAM (TEE) (N/A) REMOVAL OF CENTRIMAG VENTRICULAR ASSIST DEVICE (N/A) EVACUATION HEMATOMA (Left) Subjective: Shares that her pain is uncontrolled, especially during the night. She has not been able to sleep more than a few hours at a time.   Objective: Vital signs in last 24 hours: Temp:  [97.5 F (36.4 C)-98.5 F (36.9 C)] 98.5 F (36.9 C) (12/02 0453) Pulse Rate:  [93-104] 101 (12/02 0453) Cardiac Rhythm: Normal sinus rhythm (12/01 2046) Resp:  [17-20] 17 (12/02 0453) BP: (101-110)/(60-80) 101/68 (12/02 0453) SpO2:  [97 %-100 %] 97 % (12/02 0453) Weight:  [160 lb 14.4 oz (73 kg)] 160 lb 14.4 oz (73 kg) (12/02 0453)     Intake/Output from previous day: 12/01 0701 - 12/02 0700 In: 702 [P.O.:702] Out: 1200 [Urine:1200] Intake/Output this shift: No intake/output data recorded.  General appearance: alert, cooperative and no distress Heart: regular rate and rhythm Lungs: clear to auscultation bilaterally Abdomen: soft, non-tender; bowel sounds normal; no masses,  no organomegaly Extremities: extremities normal, atraumatic, no cyanosis or edema Wound: necrotic tissue mid left thigh. Some purulent drainaging on the dressing. Sternal incision without drainage but does have areas of necrosis along the incision. Chest tube stitches in place without necrosis  Lab Results:  Recent Labs  11/14/16 0858 11/15/16 0442  WBC 11.7* 14.0*  HGB 10.3* 10.6*  HCT 31.3* 32.5*  PLT 366 475*   BMET:  Recent Labs  11/14/16 0858 11/15/16 0442  NA 137 138  K 4.2 4.8  CL 102 101  CO2 23 24  GLUCOSE 144* 95  BUN 14 16  CREATININE 0.93 0.95  CALCIUM 9.0 9.4    PT/INR: No results for input(s): LABPROT, INR in the last 72 hours. ABG    Component Value Date/Time   PHART 7.517 (H)  11/10/2016 1253   HCO3 30.0 (H) 11/10/2016 1253   TCO2 26 11/08/2016 2139   ACIDBASEDEF 3.0 (H) 11/03/2016 1950   O2SAT 45.5 11/15/2016 0448   CBG (last 3)   Recent Labs  11/12/16 1154 11/12/16 1611 11/14/16 1639  GLUCAP 137* 99 107*    Assessment/Plan: S/P Procedure(s) (LRB): STERNAL WASHOUT WITH STERNAL CLOSURE (N/A) TRANSESOPHAGEAL ECHOCARDIOGRAM (TEE) (N/A) REMOVAL OF CENTRIMAG VENTRICULAR ASSIST DEVICE (N/A) EVACUATION HEMATOMA (Left)  1. CV - Previous cardiogenic shock, biventricular failure.HR in the low 100's. On Lopressor 12.5 mg bid, Spironolactone 25 mg daily, and Plavix 75 mg daily. Off Milrinone. Last co ox 45.5. Heart failure following. 2.  Pulmonary - On room air. Encourage incentive spirometer. No recent CXR. 3. Volume Overload - On Lasix 40 mg daily. Weight maintaining. HF following. 4.  Acute blood loss anemia - H and H yesterday 10.6 and 32.5 5. ID-Keflex 500 q6 hours for cellulitis 6. Regarding pain control, she is on Fentanyl patch, Oxy IR q3 hours PRN, and Morphine IV PRN. She shares that the morphine makes her hyper therefore she has not been taking it. She shares the patch isn't working and is requesting a change in her pain medication. Will discuss a different regiment with my attending.   Plan: Continue to monitor wounds closely. Mobilize. Diuresis. Will continue to work on pain control. Possibly will require a debridement of her leg incision on Monday with Dr. Cornelius Moraswen.  LOS: 15 days    Sharlene Doryessa N Conte 11/15/2016 Left thigh wound with some skin edge necrosis, extensive ecchymosis. I don't see any indication to open wound at this point Pain control continues to be an issue. She tells me she was taking oxycodone 10 mg 4 times a day at home, not 3 times as records indicate. Not happy with fentanyl patch. Will dc fentanyl and morphine Increase oxycodone to 5-15 mg Q3 PRN, dilaudid for breakthrough pain  Viviann SpareSteven C. Dorris FetchHendrickson, MD [facility] [phone]

## 2016-11-16 LAB — CBC WITH DIFFERENTIAL/PLATELET
Basophils Absolute: 0 10*3/uL (ref 0.0–0.1)
Basophils Relative: 0 %
EOS ABS: 0.2 10*3/uL (ref 0.0–0.7)
Eosinophils Relative: 2 %
HEMATOCRIT: 35.2 % — AB (ref 36.0–46.0)
HEMOGLOBIN: 11.3 g/dL — AB (ref 12.0–15.0)
LYMPHS ABS: 2 10*3/uL (ref 0.7–4.0)
LYMPHS PCT: 17 %
MCH: 28.6 pg (ref 26.0–34.0)
MCHC: 32.1 g/dL (ref 30.0–36.0)
MCV: 89.1 fL (ref 78.0–100.0)
MONOS PCT: 7 %
Monocytes Absolute: 0.9 10*3/uL (ref 0.1–1.0)
NEUTROS ABS: 9 10*3/uL — AB (ref 1.7–7.7)
NEUTROS PCT: 74 %
Platelets: 513 10*3/uL — ABNORMAL HIGH (ref 150–400)
RBC: 3.95 MIL/uL (ref 3.87–5.11)
RDW: 14.7 % (ref 11.5–15.5)
WBC: 12.1 10*3/uL — AB (ref 4.0–10.5)

## 2016-11-16 LAB — BASIC METABOLIC PANEL
ANION GAP: 11 (ref 5–15)
BUN: 21 mg/dL — ABNORMAL HIGH (ref 6–20)
CHLORIDE: 101 mmol/L (ref 101–111)
CO2: 25 mmol/L (ref 22–32)
Calcium: 9.8 mg/dL (ref 8.9–10.3)
Creatinine, Ser: 0.9 mg/dL (ref 0.44–1.00)
GFR calc non Af Amer: >60 mL/min (ref >60)
Glucose, Bld: 107 mg/dL — ABNORMAL HIGH (ref 65–99)
POTASSIUM: 4.9 mmol/L (ref 3.5–5.1)
SODIUM: 137 mmol/L (ref 135–145)

## 2016-11-16 MED ORDER — ONDANSETRON HCL 4 MG PO TABS
4.0000 mg | ORAL_TABLET | Freq: Three times a day (TID) | ORAL | Status: DC | PRN
  Administered 2016-11-16: 4 mg via ORAL
  Filled 2016-11-16: qty 1

## 2016-11-16 MED ORDER — FUROSEMIDE 40 MG PO TABS
40.0000 mg | ORAL_TABLET | Freq: Two times a day (BID) | ORAL | Status: DC
  Administered 2016-11-17: 40 mg via ORAL
  Filled 2016-11-16: qty 1

## 2016-11-16 NOTE — Progress Notes (Addendum)
      [address]       [place] [id number]             [phone]      9 Days Post-Op Procedure(s) (LRB): STERNAL WASHOUT WITH STERNAL CLOSURE (N/A) TRANSESOPHAGEAL ECHOCARDIOGRAM (TEE) (N/A) REMOVAL OF CENTRIMAG VENTRICULAR ASSIST DEVICE (N/A) EVACUATION HEMATOMA (Left) Subjective: Shares she is still having pain control issues but its better. She slept well last night.   Objective: Vital signs in last 24 hours: Temp:  [97.7 F (36.5 C)-99.7 F (37.6 C)] 98.2 F (36.8 C) (12/03 0900) Pulse Rate:  [93-111] 106 (12/03 0900) Cardiac Rhythm: Normal sinus rhythm (12/02 2028) Resp:  [18-24] 20 (12/03 0900) BP: (94-112)/(61-76) 94/61 (12/03 0900) SpO2:  [96 %-100 %] 98 % (12/03 0900) Weight:  [155 lb (70.3 kg)] 155 lb (70.3 kg) (12/03 0514)     Intake/Output from previous day: 12/02 0701 - 12/03 0700 In: 1110 [P.O.:1080; I.V.:30] Out: 2050 [Urine:2050] Intake/Output this shift: Total I/O In: 300 [P.O.:300] Out: -   General appearance: alert, cooperative and no distress Heart: sinus tachardia Lungs: clear to auscultation bilaterally Abdomen: soft, non-tender; bowel sounds normal; no masses,  no organomegaly Extremities: extremities normal, atraumatic, no cyanosis or edema Wound: sternal incisional with intermittent necrosis. Left leg with necrosis and a small amount of blood on dressing.   Lab Results:  Recent Labs  11/15/16 0442 11/16/16 0530  WBC 14.0* 12.1*  HGB 10.6* 11.3*  HCT 32.5* 35.2*  PLT 475* 513*   BMET:  Recent Labs  11/15/16 0442 11/16/16 0530  NA 138 137  K 4.8 4.9  CL 101 101  CO2 24 25  GLUCOSE 95 107*  BUN 16 21*  CREATININE 0.95 0.90  CALCIUM 9.4 9.8    PT/INR: No results for input(s): LABPROT, INR in the last 72 hours. ABG    Component Value Date/Time   PHART 7.517 (H) 11/10/2016 1253   HCO3 30.0 (H) 11/10/2016 1253   TCO2 26 11/08/2016 2139   ACIDBASEDEF 3.0 (H) 11/03/2016 1950   O2SAT 45.5 11/15/2016  0448   CBG (last 3)   Recent Labs  11/14/16 1639  GLUCAP 107*    Assessment/Plan: S/P Procedure(s) (LRB): STERNAL WASHOUT WITH STERNAL CLOSURE (N/A) TRANSESOPHAGEAL ECHOCARDIOGRAM (TEE) (N/A) REMOVAL OF CENTRIMAG VENTRICULAR ASSIST DEVICE (N/A) EVACUATION HEMATOMA (Left)     1. CV - Previous cardiogenic shock, biventricular failure.HR 110. BP remains low. On Lopressor 12.5 mg bid, Spironolactone 25 mg daily, and Plavix 75 mg daily.  Last co ox 45.5. Heart failure following. 2. Pulmonary - On room air. Encourage incentive spirometer. No recent CXR. 3. Volume Overload - On Lasix 40 mg daily. Weight down to baseline. HF following. Creatinine stable at 0.90.  4. Acute blood loss anemia - H and H 11.3 and 35.2 5. ID-Keflex 500 q6 hours for cellulitis 6. Regarding pain control, Dilaudid IV for breakthrough pain, Oxy IR 5-15mg  q3 hours PRN. She shares it is better controlled with the changes.   Plan: Continue to monitor wounds closely. Mobilize. Diuresis. Pain will need controlled on just pills before discharge.    LOS: 16 days    Sharlene Doryessa N Conte 11/16/2016  Patient seen and examined, agree with above Left thigh wound with dressing partially covering. The part that is visible is unchanged  Viviann SpareSteven C. Dorris FetchHendrickson, MD [facility] [phone]

## 2016-11-17 LAB — CBC WITH DIFFERENTIAL/PLATELET
Basophils Absolute: 0 10*3/uL (ref 0.0–0.1)
Basophils Relative: 0 %
EOS ABS: 0.3 10*3/uL (ref 0.0–0.7)
EOS PCT: 3 %
HCT: 33.8 % — ABNORMAL LOW (ref 36.0–46.0)
Hemoglobin: 11 g/dL — ABNORMAL LOW (ref 12.0–15.0)
LYMPHS ABS: 1.4 10*3/uL (ref 0.7–4.0)
Lymphocytes Relative: 15 %
MCH: 29 pg (ref 26.0–34.0)
MCHC: 32.5 g/dL (ref 30.0–36.0)
MCV: 89.2 fL (ref 78.0–100.0)
MONO ABS: 0.8 10*3/uL (ref 0.1–1.0)
MONOS PCT: 9 %
Neutro Abs: 6.9 10*3/uL (ref 1.7–7.7)
Neutrophils Relative %: 73 %
PLATELETS: 538 10*3/uL — AB (ref 150–400)
RBC: 3.79 MIL/uL — ABNORMAL LOW (ref 3.87–5.11)
RDW: 15.2 % (ref 11.5–15.5)
WBC: 9.4 10*3/uL (ref 4.0–10.5)

## 2016-11-17 LAB — BASIC METABOLIC PANEL
Anion gap: 12 (ref 5–15)
BUN: 20 mg/dL (ref 6–20)
CHLORIDE: 100 mmol/L — AB (ref 101–111)
CO2: 23 mmol/L (ref 22–32)
CREATININE: 0.83 mg/dL (ref 0.44–1.00)
Calcium: 9.7 mg/dL (ref 8.9–10.3)
GFR calc Af Amer: >60 mL/min (ref >60)
GFR calc non Af Amer: >60 mL/min (ref >60)
GLUCOSE: 103 mg/dL — AB (ref 65–99)
Potassium: 5.1 mmol/L (ref 3.5–5.1)
SODIUM: 135 mmol/L (ref 135–145)

## 2016-11-17 LAB — COOXEMETRY PANEL
Carboxyhemoglobin: 2.5 % — ABNORMAL HIGH (ref 0.5–1.5)
Methemoglobin: 0.8 % (ref 0.0–1.5)
O2 Saturation: 56.4 %
TOTAL HEMOGLOBIN: 11.4 g/dL — AB (ref 12.0–16.0)

## 2016-11-17 MED ORDER — ATORVASTATIN CALCIUM 80 MG PO TABS: 80.0000 mg | ORAL_TABLET | Freq: Every day | ORAL | 1 refills | Status: DC

## 2016-11-17 MED ORDER — FUROSEMIDE 40 MG PO TABS: 40.0000 mg | ORAL_TABLET | Freq: Every day | ORAL | 1 refills | Status: DC

## 2016-11-17 MED ORDER — CEPHALEXIN 500 MG PO CAPS: 500.0000 mg | ORAL_CAPSULE | Freq: Four times a day (QID) | ORAL | 0 refills | Status: DC

## 2016-11-17 MED ORDER — ACETAMINOPHEN 325 MG PO TABS: 650.0000 mg | ORAL_TABLET | Freq: Four times a day (QID) | ORAL | Status: DC | PRN

## 2016-11-17 MED ORDER — OXYCODONE HCL 10 MG PO TABS: 5.0000 mg | ORAL_TABLET | Freq: Four times a day (QID) | ORAL | 0 refills | Status: DC | PRN

## 2016-11-17 MED ORDER — SPIRONOLACTONE 25 MG PO TABS: 25.0000 mg | ORAL_TABLET | Freq: Every day | ORAL | 1 refills | Status: DC

## 2016-11-17 MED ORDER — METOPROLOL TARTRATE 25 MG PO TABS: 12.5000 mg | ORAL_TABLET | Freq: Two times a day (BID) | ORAL | 1 refills | Status: DC

## 2016-11-17 MED ORDER — OXYCODONE HCL 5 MG PO TABS: 5.0000 mg | ORAL_TABLET | ORAL | 0 refills | Status: DC | PRN

## 2016-11-17 NOTE — Progress Notes (Addendum)
      [address]       [place] [id number]             [phone]        10 Days Post-Op Procedure(s) (LRB): STERNAL WASHOUT WITH STERNAL CLOSURE (N/A) TRANSESOPHAGEAL ECHOCARDIOGRAM (TEE) (N/A) REMOVAL OF CENTRIMAG VENTRICULAR ASSIST DEVICE (N/A) EVACUATION HEMATOMA (Left)  Subjective: Patient states her pain is fairly well controlled with Oxy IR every 3 hours.  Objective: Vital signs in last 24 hours: Temp:  [97.7 F (36.5 C)-98.7 F (37.1 C)] 98.7 F (37.1 C) (12/04 0530) Pulse Rate:  [88-114] 99 (12/03 2026) Cardiac Rhythm: Normal sinus rhythm (12/03 2032) Resp:  [16-22] 16 (12/04 0530) BP: (94-101)/(61-75) 99/61 (12/04 0530) SpO2:  [97 %-98 %] 98 % (12/03 2026) Weight:  [154 lb 12.8 oz (70.2 kg)] 154 lb 12.8 oz (70.2 kg) (12/04 0500)  Pre op weight 71 kg Current Weight  11/17/16 154 lb 12.8 oz (70.2 kg)      Intake/Output from previous day: 12/03 0701 - 12/04 0700 In: 780 [P.O.:780] Out: 1300 [Urine:1300]   Physical Exam:  Cardiovascular: RRR Pulmonary: Diminished at bases Abdomen: Soft, non tender, bowel sounds present. Extremities: Mild bilateral lower extremity edema. Right thigh with hematoma, ecchymosis and area of skin necrosis medially.  Wounds: Clean and dry.  No erythema or signs of infection. Staples intact and skin edge necrosis.  Lab Results: CBC:  Recent Labs  11/16/16 0530 11/17/16 0410  WBC 12.1* 9.4  HGB 11.3* 11.0*  HCT 35.2* 33.8*  PLT 513* 538*   BMET:   Recent Labs  11/16/16 0530 11/17/16 0410  NA 137 135  K 4.9 5.1  CL 101 100*  CO2 25 23  GLUCOSE 107* 103*  BUN 21* 20  CREATININE 0.90 0.83  CALCIUM 9.8 9.7    PT/INR:  Lab Results  Component Value Date   INR 1.38 11/07/2016   INR 1.43 11/07/2016   INR 1.53 11/06/2016   ABG:  INR: Will add last result for INR, ABG once components are confirmed Will add last 4 CBG results once components are confirmed  Assessment/Plan:  1. CV -  Previous cardiogenic shock, biventricular failure.HR in the 90's. On Lopressor 12.5 mg bid, Spironolactone 25 mg daily, and Plavix 75 mg daily. Heart failure following. 2.  Pulmonary - On room air. Encourage incentive spirometer. 3. Volume Overload - On Lasix 40 mg bid 4.  Acute blood loss anemia - H and H yesterday 11 and 33.8 5. ID-On Keflex for left thigh cellulities 6. Regarding pain control, she is on Oxy IR PRN, and Dilaudid IV PRN.   ZIMMERMAN,DONIELLE MPA-C 11/17/2016,7:20 AM   I have seen and examined the patient and agree with the assessment and plan as outlined.  Purcell Nailslarence H Jacqueli Pangallo, MD 11/17/2016 11:24 AM

## 2016-11-17 NOTE — Care Management Note (Signed)
Case Management Note Donn PieriniKristi Rori Goar RN, BSN Unit 2W-Case Manager [phone]  Patient Details  Name: Christina CascoKelly Morrison MRN: [id number] Date of Birth: 1976-07-07  Subjective/Objective:   [place] s/p CABGx3 with post op arrest, then sternal wash out and closure. tx from ICU to 2W on 11/29 after prolonged ICU stay               Action/Plan: PTA pt lived at home- plan to return home- [facility]- spoke with pt at bedside- [facility] [place]-  Per pt choice [facility]- [facility]Karen [facility]-   Expected Discharge Date:    11/17/16              Expected Discharge Plan:  Home w Home Health Services  In-House Referral:     Discharge planning Services  CM Consult  Post Acute Care Choice:  Home Health Choice offered to:  Patient  DME Arranged:  N/A DME Agency:  NA  HH Arranged:  RN HH Agency:  [facility]  Status of Service:  Completed, signed off  If discussed at Long Length of Stay Meetings, dates discussed:    Discharge Disposition: Home with Home Health  Additional Comments:  Darrold SpanWebster, Mikah Poss Hall, RN 11/17/2016, 3:05 PM

## 2016-11-17 NOTE — Progress Notes (Signed)
Instructed pt on procedure. HOB less than 45 degrees. Pt held breath while during line removal. Pressure held for 5 min. Instructed to remain lying down for 30 min. Instructed pt to monitor and report any s/sx of bleeding. Pressure drsg applied and instructed pt to leave drsg CDI for 24 hrs. Pt VU. Tomasita MorrowHeather Cheveyo [place], RN VAST

## 2016-11-17 NOTE — Progress Notes (Signed)
Advanced Heart Failure Rounding Note   Subjective:    40 y/o woman with HTN, CAD and tobacco use. Underwent stenting of LCX in 1/17. Readmitted for NSTEMI. Found to have 2v CAD with high-grade ostial LAD and LCX stensosis. Underwent CABG with Dr. Cornelius Moraswen with LIMA to LAD, SVG to DIAG and SVG to LCX. Developed VF arrest and had re-do sternotomy begun at bedside with cardia massage. 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 Underwent re-do CABG x1 with SVG to OM and placement of biVAD support due to shock.   Went to OR 11/24  for explantation of bivads and chest closure. Did well.  Denies SOB. Complaining of chest soreness. Wants to go home.    Echo 11/30  EF 50-55% RV not seen very well but looks normal    Objective:   Weight Range:  Vital Signs:   Temp:  [97.7 F (36.5 C)-98.7 F (37.1 C)] 98.7 F (37.1 C) (12/04 0530) Pulse Rate:  [88-114] 99 (12/03 2026) Resp:  [16-22] 16 (12/04 0530) BP: (94-101)/(61-75) 99/61 (12/04 0530) SpO2:  [97 %-98 %] 98 % (12/03 2026) Weight:  [154 lb 12.8 oz (70.2 kg)] 154 lb 12.8 oz (70.2 kg) (12/04 0500) Last BM Date: 11/16/16  Weight change: Filed Weights   11/15/16 0453 11/16/16 0514 11/17/16 0500  Weight: 160 lb 14.4 oz (73 kg) 155 lb (70.3 kg) 154 lb 12.8 oz (70.2 kg)    Intake/Output:   Intake/Output Summary (Last 24 hours) at 11/17/16 0803 Last data filed at 11/16/16 1724  Gross per 24 hour  Intake              780 ml  Output             1300 ml  Net             -520 ml     Physical Exam: General:  NAD in the chair HEENT: normal  Neck: supple. JVP 5-6  Cor: RRR Lungs: RLL crackles.  Abdomen: soft, nontender, +distended. quiet Extremities: warm.  L groin sutured with some skin breakdown and eschar Ecchymotic. No edema.  RUE PICC Neuro: Alert and oriented x3. Follows commands.   Telemetry: Sinus  90s  Labs: Basic Metabolic Panel:  Recent Labs Lab 11/13/16 0456 11/14/16 0858 11/15/16 0442 11/16/16 0530 11/17/16 0410    NA 139 137 138 137 135  K 3.8 4.2 4.8 4.9 5.1  CL 103 102 101 101 100*  CO2 27 23 24 25 23   GLUCOSE 116* 144* 95 107* 103*  BUN 14 14 16  21* 20  CREATININE 0.77 0.93 0.95 0.90 0.83  CALCIUM 8.8* 9.0 9.4 9.8 9.7    Liver Function Tests:  Recent Labs Lab 11/11/16 0500  AST 49*  ALT 35  ALKPHOS 79  BILITOT 1.8*  PROT 6.6  ALBUMIN 3.0*   No results for input(s): LIPASE, AMYLASE in the last 168 hours. No results for input(s): AMMONIA in the last 168 hours.  CBC:  Recent Labs Lab 11/13/16 0456 11/14/16 0858 11/15/16 0442 11/16/16 0530 11/17/16 0410  WBC 13.2* 11.7* 14.0* 12.1* 9.4  NEUTROABS  --  8.7* 10.9* 9.0* 6.9  HGB 10.8* 10.3* 10.6* 11.3* 11.0*  HCT 33.0* 31.3* 32.5* 35.2* 33.8*  MCV 88.0 88.2 88.3 89.1 89.2  PLT 321 366 475* 513* 538*    Cardiac Enzymes: No results for input(s): CKTOTAL, CKMB, CKMBINDEX, TROPONINI in the last 168 hours.  BNP: BNP (last 3 results) No results for input(s): BNP in  the last 8760 hours.  ProBNP (last 3 results) No results for input(s): PROBNP in the last 8760 hours.    Other results:  Imaging: No results found.   Medications:     Scheduled Medications: . aspirin EC  81 mg Oral Daily  . atorvastatin  80 mg Oral q1800  . bisacodyl  10 mg Oral Daily   Or  . bisacodyl  10 mg Rectal Daily  . buPROPion  300 mg Oral Daily  . cephALEXin  500 mg Oral Q6H  . clopidogrel  75 mg Oral Q breakfast  . docusate sodium  200 mg Oral Daily  . enoxaparin (LOVENOX) injection  30 mg Subcutaneous Q24H  . escitalopram  20 mg Oral QHS  . feeding supplement (ENSURE ENLIVE)  237 mL Oral BID BM  . furosemide  40 mg Oral BID  . metoprolol tartrate  12.5 mg Oral BID  . pantoprazole  40 mg Oral Daily  . potassium chloride  40 mEq Oral BID  . sodium chloride flush  10-40 mL Intracatheter Q12H  . spironolactone  25 mg Oral Daily    Infusions: . sodium chloride Stopped (11/15/16 1900)  . sodium chloride 20 mL/hr at 11/16/16 2121     PRN Medications: sodium chloride, acetaminophen, ALPRAZolam, HYDROmorphone (DILAUDID) injection, ondansetron (ZOFRAN) IV, ondansetron, oxyCODONE, sodium chloride flush   Assessment:   1. Cardiogenic shock with acute biventricular failure    --s/p removal of bivads 11/24 2. VF arrest 3. CAD s/p CABG with emergent re-do 4. NSTEMI 5. Acute respiratory failure 6. Acute blood loss anemia 7. Hypokalemia 8. Left groin hematoma   Plan/Discussion:     Echo 11/13/2016  EF 50-55% RV not seen very well but looks normal    Volume status stable. Continue po lasix. Continue spiro. Tolerating low-dose b-blocker.   Will need wound care. Continue broad spectrum abx. WBC 9.4. BCx NGTD.    Continue to ambulate.  Length of Stay: 17   Amy Clegg NP-C  11/17/2016, 8:03 AM  Advanced Heart Failure Team Pager [phone] (M-F; 7a - 4p)  Please contact [facility] for night-coverage after hours (4p -7a ) and weekends on [email or web address]  Patient seen and examined with Tonye BecketAmy Clegg, NP. We discussed all aspects of the encounter. I agree with the assessment and plan as stated above.   She is improved. Co-ox 56%. Can go home today. Would use lasix 40 daily and can stop as need for dizziness. We will see in clinic next week.   Marykathryn Carboni,MD 4:09 PM

## 2016-11-17 NOTE — Discharge Instructions (Signed)
Patient instructed to weight her self daily and record. Also, patient instructed to apply dressing to left thigh and change daily.  Coronary Artery Bypass Grafting, Care After Refer to this sheet in the next few weeks. These instructions provide you with information on caring for yourself after your procedure. Your health care provider may also give you more specific instructions. Your treatment has been planned according to current medical practices, but problems sometimes occur. Call your health care provider if you have any problems or questions after your procedure. WHAT TO EXPECT AFTER THE PROCEDURE Recovery from surgery will be different for everyone. Some people feel well after 3 or 4 weeks, while for others it takes longer. After your procedure, it is typical to have the following:  Nausea and a lack of appetite.   Constipation.  Weakness and fatigue.   Depression or irritability.   Pain or discomfort at your incision site. HOME CARE INSTRUCTIONS  Take medicines only as directed by your health care provider. Do not stop taking medicines or start any new medicines without first checking with your health care provider.  Take your pulse as directed by your health care provider.  Perform deep breathing as directed by your health care provider. If you were given a device called an incentive spirometer, use it to practice deep breathing several times a day. Support your chest with a pillow or your arms when you take deep breaths or cough.  Keep incision areas clean, dry, and protected. Remove or change any bandages (dressings) only as directed by your health care provider. You may have skin adhesive strips over the incision areas. Do not take the strips off. They will fall off on their own.  Check incision areas daily for any swelling, redness, or drainage.  If incisions were made in your legs, do the following:  Avoid crossing your legs.   Avoid sitting for long periods of  time. Change positions every 30 minutes.   Elevate your legs when you are sitting.  Wear compression stockings as directed by your health care provider. These stockings help keep blood clots from forming in your legs.  Take showers once your health care provider approves. Until then, only take sponge baths. Pat incisions dry. Do not rub incisions with a washcloth or towel. Do not take baths, swim, or use a hot tub until your health care provider approves.  Eat foods that are high in fiber, such as raw fruits and vegetables, whole grains, beans, and nuts. Meats should be lean cut. Avoid canned, processed, and fried foods.  Drink enough fluid to keep your urine clear or pale yellow.  Weigh yourself every day. This helps identify if you are retaining fluid that may make your heart and lungs work harder.  Rest and limit activity as directed by your health care provider. You may be instructed to:  Stop any activity at once if you have chest pain, shortness of breath, irregular heartbeats, or dizziness. Get help right away if you have any of these symptoms.  Move around frequently for short periods or take short walks as directed by your health care provider. Increase your activities gradually. You may need physical therapy or cardiac rehabilitation to help strengthen your muscles and build your endurance.  Avoid lifting, pushing, or pulling anything heavier than 10 lb (4.5 kg) for at least 6 weeks after surgery.  Do not drive until your health care provider approves.  Ask your health care provider when you may return to work.  Ask your health care provider when you may resume sexual activity.  Keep all follow-up visits as directed by your health care provider. This is important. SEEK MEDICAL CARE IF:  You have swelling, redness, increasing pain, or drainage at the site of an incision.  You have a fever.  You have swelling in your ankles or legs.  You have pain in your legs.   You  gain 2 or more pounds (0.9 kg) a day.  You are nauseous or vomit.  You have diarrhea. SEEK IMMEDIATE MEDICAL CARE IF:  You have chest pain that goes to your jaw or arms.  You have shortness of breath.   You have a fast or irregular heartbeat.   You notice a "clicking" in your breastbone (sternum) when you move.   You have numbness or weakness in your arms or legs.  You feel dizzy or light-headed.  MAKE SURE YOU:  Understand these instructions.  Will watch your condition.  Will get help right away if you are not doing well or get worse. This information is not intended to replace advice given to you by your health care provider. Make sure you discuss any questions you have with your health care provider. Document Released: 06/20/2005 Document Revised: 12/22/2014 Document Reviewed: 05/10/2013 Elsevier Interactive Patient Education  2017 [employer]

## 2016-11-17 NOTE — Progress Notes (Signed)
CARDIAC REHAB PHASE I   Pt very eager to leave, states she has walked independently, declines ambulation at this time. Cardiac surgery discharge education completed with pt and her parents at bedside. Reviewed risk factors, tobacco cessation, IS, sternal precautions, activity progression, restrictions, exercise, daily weights, heart healthy diet and phase 2 cardiac rehab. Pt verbalized understanding. Pt agrees to phase 2 cardiac rehab referral, [facility]. Pt in recliner, call bell within reach. Will follow if pt does not discharge today.   [phone] Joylene GrapesEmily C Esa Raden, RN, BSN 11/17/2016 11:25 AM

## 2016-11-17 NOTE — Progress Notes (Signed)
Patient discharged teaching given including activity, diet, follow-up appointments and medication. Patient verbalized understanding of all discharge instructions. PICC access was dc'd. Patient remained on bedrest for 15 minutes. Chest tubes sutures to be taken out at next physician visit. Vitals are stable. Skin is intact. Pt to be escorted out by NT, to be driven home by family.

## 2016-11-18 DIAGNOSIS — Z48812 Encounter for surgical aftercare following surgery on the circulatory system: Secondary | ICD-10-CM | POA: Diagnosis not present

## 2016-11-20 ENCOUNTER — Other Ambulatory Visit: Payer: Self-pay | Admitting: Thoracic Surgery (Cardiothoracic Vascular Surgery)

## 2016-11-20 ENCOUNTER — Other Ambulatory Visit: Payer: Self-pay | Admitting: *Deleted

## 2016-11-20 ENCOUNTER — Telehealth: Payer: Self-pay | Admitting: Physician Assistant

## 2016-11-20 DIAGNOSIS — Z951 Presence of aortocoronary bypass graft: Secondary | ICD-10-CM

## 2016-11-20 DIAGNOSIS — G8918 Other acute postprocedural pain: Secondary | ICD-10-CM

## 2016-11-20 MED ORDER — TRAMADOL HCL 50 MG PO TABS: 50.0000 mg | ORAL_TABLET | Freq: Four times a day (QID) | ORAL | 0 refills | Status: DC | PRN

## 2016-11-20 NOTE — Telephone Encounter (Signed)
Christina Morrison contacted the [facility] office today as she is requesting more Christina Endoxycodone for pain. I gave her a prescription on Monday 11/17/2016 (day of discharge) for Oxy 5 mg 1-2 tablets po every 4 hours PRN severe pain. I also instructed the patient to only take Oxy if absolutely necessary as she would NOT be given a refill. After discussion with Dr. Cornelius Moraswen, I contacted my office and [facility]. We will give Christina EndoKelly a prescription for Ultram 50 mg one po every 6 hours PRN moderate pain (# 50, no refill). I personally spoke to the patient and told her this would be available for pickup at her pharmacy later today. Again, I instructed her to take only if absolutely necessary. She may also take Tylenol to help with mild pain. We will see Christina EndoKelly in the office on Monday 11/24/2016.

## 2016-11-24 ENCOUNTER — Encounter: Payer: Medicaid Other | Admitting: Thoracic Surgery (Cardiothoracic Vascular Surgery)

## 2016-11-24 ENCOUNTER — Ambulatory Visit (INDEPENDENT_AMBULATORY_CARE_PROVIDER_SITE_OTHER): Payer: Self-pay | Admitting: Physician Assistant

## 2016-11-24 ENCOUNTER — Ambulatory Visit
Admission: RE | Admit: 2016-11-24 | Discharge: 2016-11-24 | Disposition: A | Discharge status: Home / Self Care | Payer: Medicaid Other | Source: Ambulatory Visit | Attending: Thoracic Surgery (Cardiothoracic Vascular Surgery) | Admitting: Thoracic Surgery (Cardiothoracic Vascular Surgery)

## 2016-11-24 VITALS — BP 91/61 | HR 82 | Resp 20 | Ht 62.0 in | Wt 154.0 lb

## 2016-11-24 DIAGNOSIS — Z951 Presence of aortocoronary bypass graft: Secondary | ICD-10-CM

## 2016-11-24 NOTE — Progress Notes (Signed)
HPI:  Patient returns for routine postoperative follow-up having undergone CABG x 3, Emergent Redo Sternotomy with Redo CABG x 1, Placement of Bi-Ventricular on 11/03/2016. The patient's early postoperative recovery while in the hospital was notable for heart failure managed by AHF team, she weaned off biventricular assist devices, fever with subsequent treatment with ABX.  She has a large hematoma with skin necrosis of LLE from open EVH harvest.  Since hospital discharge the patient reports that she is doing better.  She continues to have pain along her chest and her sides.  She states she thinks her leg looks better, but states it continues to drain.  She denies fevers and chills.  She states the pain medication she was prescribed last week is not working.  Current Outpatient Prescriptions  Medication Sig Dispense Refill  . acetaminophen (TYLENOL) 325 MG tablet Take 2 tablets (650 mg total) by mouth every 6 (six) hours as needed for mild pain or headache.    . ALPRAZolam (XANAX) 0.5 MG tablet Take 0.5 mg by mouth daily.     [name]. aspirin EC 81 MG EC tablet Take 1 tablet (81 mg total) by mouth daily.    [name]. atorvastatin (LIPITOR) 80 MG tablet Take 1 tablet (80 mg total) by mouth daily. 30 tablet 1  . buPROPion (WELLBUTRIN XL) 300 MG 24 hr tablet Take 300 mg by mouth every morning.  1  . cephALEXin (KEFLEX) 500 MG capsule Take 1 capsule (500 mg total) by mouth every 6 (six) hours. 30 capsule 0  . clopidogrel (PLAVIX) 75 MG tablet TAKE 1 TABLET BY MOUTH DAILY WITH BREAKFAST (Patient taking differently: Take 75 mg by mouth once a day) 30 tablet 10  . escitalopram (LEXAPRO) 20 MG tablet Take 20 mg by mouth daily.  3  . fluticasone (FLONASE) 50 MCG/ACT nasal spray Place 2 sprays into both nostrils daily as needed for allergies or rhinitis.    . furosemide (LASIX) 40 MG tablet Take 1 tablet (40 mg total) by mouth daily. If dizzy or weight continues to decrease, please stop. 30 tablet 1  . metoprolol tartrate  (LOPRESSOR) 25 MG tablet Take 0.5 tablets (12.5 mg total) by mouth 2 (two) times daily. 30 tablet 1  . ondansetron (ZOFRAN-ODT) 4 MG disintegrating tablet Take 4 mg by mouth daily as needed for nausea or vomiting. DISSOLVE  3  . oxyCODONE (OXY IR/ROXICODONE) 5 MG immediate release tablet Take 1-2 tablets (5-10 mg total) by mouth every 4 (four) hours as needed for severe pain. 30 tablet 0  . pantoprazole (PROTONIX) 40 MG tablet TAKE 1 TABLET BY MOUTH TWICE A DAY BEFORE MEALS (Patient taking differently: Take 40 mg by mouth two times a day before meals) 60 tablet 10  . spironolactone (ALDACTONE) 25 MG tablet Take 1 tablet (25 mg total) by mouth daily. 30 tablet 1  . traMADol (ULTRAM) 50 MG tablet Take 1 tablet (50 mg total) by mouth every 6 (six) hours as needed for moderate pain. 50 tablet 0   No current facility-administered medications for this visit.     Physical Exam   BP 91/61   Pulse 82   Resp 20   Ht 5\' 2"  (1.575 m)   Wt 154 lb (69.9 kg)   LMP 10/27/2016 (Exact Date)   SpO2 98% Comment: RA  BMI 28.17 kg/m   Gen: no apparent distress Heart: RRR Lungs: CTA bilaterally Ext: trace Wounds: sternotomy well healed, + eschar present, staples in place, Right EVH site well healed, Left  open vein harvest site remains tender to touch, + hematoma present, approximately 5-8 cm skin breakdown present, staples in place.  Diagnostic Tests:  CXR: stable post surgical changes, minimal pleural effusion on left, improvement of aeration  A/P:  1. S/P CABG- hemodynamically stable, medication adjustments per AHF 2. Wound Care- staples removed from sternotomy, Left lower extremity- staples removed, skin breakdown remains present, minimal dehiscence at skin edge.  Patient given wound care instructions 3. Pain control- continue with Ultram, patient used 30 Oxy in 3 days after hospital discharge.  No further pain medication refills will be provided 4. Dispo- RTC on 18/82018 with Dr. Cornelius Moraswen, should  wounds get worse she was instructed to contact our office for sooner follow up appointment   Lowella DandyBARRETT, Ithzel Fedorchak, PA-C [facility] [phone]

## 2016-11-26 ENCOUNTER — Ambulatory Visit (HOSPITAL_COMMUNITY)
Admission: RE | Admit: 2016-11-26 | Discharge: 2016-11-26 | Disposition: A | Discharge status: Home / Self Care | Payer: Medicaid Other | Source: Ambulatory Visit | Attending: Cardiology | Admitting: Cardiology

## 2016-11-26 VITALS — BP 102/70 | HR 77 | Wt 158.6 lb

## 2016-11-26 DIAGNOSIS — Z7982 Long term (current) use of aspirin: Secondary | ICD-10-CM | POA: Diagnosis not present

## 2016-11-26 DIAGNOSIS — E785 Hyperlipidemia, unspecified: Secondary | ICD-10-CM | POA: Diagnosis not present

## 2016-11-26 DIAGNOSIS — I252 Old myocardial infarction: Secondary | ICD-10-CM | POA: Diagnosis not present

## 2016-11-26 DIAGNOSIS — Z823 Family history of stroke: Secondary | ICD-10-CM | POA: Insufficient documentation

## 2016-11-26 DIAGNOSIS — Z8674 Personal history of sudden cardiac arrest: Secondary | ICD-10-CM | POA: Insufficient documentation

## 2016-11-26 DIAGNOSIS — M1990 Unspecified osteoarthritis, unspecified site: Secondary | ICD-10-CM | POA: Insufficient documentation

## 2016-11-26 DIAGNOSIS — Z8249 Family history of ischemic heart disease and other diseases of the circulatory system: Secondary | ICD-10-CM | POA: Insufficient documentation

## 2016-11-26 DIAGNOSIS — F17210 Nicotine dependence, cigarettes, uncomplicated: Secondary | ICD-10-CM | POA: Insufficient documentation

## 2016-11-26 DIAGNOSIS — I214 Non-ST elevation (NSTEMI) myocardial infarction: Secondary | ICD-10-CM | POA: Insufficient documentation

## 2016-11-26 DIAGNOSIS — I110 Hypertensive heart disease with heart failure: Secondary | ICD-10-CM | POA: Insufficient documentation

## 2016-11-26 DIAGNOSIS — I5042 Chronic combined systolic (congestive) and diastolic (congestive) heart failure: Secondary | ICD-10-CM | POA: Diagnosis not present

## 2016-11-26 DIAGNOSIS — M419 Scoliosis, unspecified: Secondary | ICD-10-CM | POA: Diagnosis not present

## 2016-11-26 DIAGNOSIS — Z951 Presence of aortocoronary bypass graft: Secondary | ICD-10-CM | POA: Insufficient documentation

## 2016-11-26 DIAGNOSIS — I2510 Atherosclerotic heart disease of native coronary artery without angina pectoris: Secondary | ICD-10-CM

## 2016-11-26 DIAGNOSIS — Z7902 Long term (current) use of antithrombotics/antiplatelets: Secondary | ICD-10-CM | POA: Diagnosis not present

## 2016-11-26 DIAGNOSIS — Z9861 Coronary angioplasty status: Secondary | ICD-10-CM | POA: Diagnosis not present

## 2016-11-26 DIAGNOSIS — F419 Anxiety disorder, unspecified: Secondary | ICD-10-CM | POA: Insufficient documentation

## 2016-11-26 DIAGNOSIS — Z9889 Other specified postprocedural states: Secondary | ICD-10-CM | POA: Insufficient documentation

## 2016-11-26 DIAGNOSIS — Z833 Family history of diabetes mellitus: Secondary | ICD-10-CM | POA: Insufficient documentation

## 2016-11-26 DIAGNOSIS — I5082 Biventricular heart failure: Secondary | ICD-10-CM | POA: Diagnosis not present

## 2016-11-26 DIAGNOSIS — F319 Bipolar disorder, unspecified: Secondary | ICD-10-CM | POA: Diagnosis not present

## 2016-11-26 DIAGNOSIS — Z888 Allergy status to other drugs, medicaments and biological substances status: Secondary | ICD-10-CM | POA: Insufficient documentation

## 2016-11-26 DIAGNOSIS — Z8673 Personal history of transient ischemic attack (TIA), and cerebral infarction without residual deficits: Secondary | ICD-10-CM | POA: Diagnosis not present

## 2016-11-26 LAB — BASIC METABOLIC PANEL
ANION GAP: 9 (ref 5–15)
BUN: 15 mg/dL (ref 6–20)
CHLORIDE: 106 mmol/L (ref 101–111)
CO2: 25 mmol/L (ref 22–32)
CREATININE: 0.87 mg/dL (ref 0.44–1.00)
Calcium: 9.4 mg/dL (ref 8.9–10.3)
GFR calc non Af Amer: >60 mL/min (ref >60)
Glucose, Bld: 103 mg/dL — ABNORMAL HIGH (ref 65–99)
POTASSIUM: 4.5 mmol/L (ref 3.5–5.1)
SODIUM: 140 mmol/L (ref 135–145)

## 2016-11-26 LAB — CBC WITH DIFFERENTIAL/PLATELET
BASOS ABS: 0 10*3/uL (ref 0.0–0.1)
Basophils Relative: 0 %
EOS PCT: 6 %
Eosinophils Absolute: 0.4 10*3/uL (ref 0.0–0.7)
HCT: 33.8 % — ABNORMAL LOW (ref 36.0–46.0)
Hemoglobin: 10.9 g/dL — ABNORMAL LOW (ref 12.0–15.0)
LYMPHS PCT: 16 %
Lymphs Abs: 1.1 10*3/uL (ref 0.7–4.0)
MCH: 29.2 pg (ref 26.0–34.0)
MCHC: 32.2 g/dL (ref 30.0–36.0)
MCV: 90.6 fL (ref 78.0–100.0)
Monocytes Absolute: 0.3 10*3/uL (ref 0.1–1.0)
Monocytes Relative: 5 %
NEUTROS ABS: 4.9 10*3/uL (ref 1.7–7.7)
Neutrophils Relative %: 73 %
PLATELETS: 297 10*3/uL (ref 150–400)
RBC: 3.73 MIL/uL — AB (ref 3.87–5.11)
RDW: 16.2 % — ABNORMAL HIGH (ref 11.5–15.5)
WBC: 6.7 10*3/uL (ref 4.0–10.5)

## 2016-11-26 LAB — BRAIN NATRIURETIC PEPTIDE: B NATRIURETIC PEPTIDE 5: 539 pg/mL — AB (ref 0.0–100.0)

## 2016-11-26 NOTE — Progress Notes (Addendum)
Advanced Heart Failure Clinic Note   Primary Care: Dr. Pearson GrippeJames Kim  Primary Cardiologist: Dr Excell Seltzerooper Primary HF: Dr. Gala RomneyBensimhon  CT Surgeon: Dr. Cornelius Moraswen  HPI:  Christina CascoKelly Keough is a 40 y.o. female with HTN, CAD and tobacco use. Pt underwent stenting of LCX in 1/17. Readmitted for NSTEMI. Found to have 2v CAD with high-grade ostial LAD and LCX stensosis.  Pt underwent CABG 11/03/16 with Dr. Cornelius Moraswen with LIMA to LAD, SVG to DIAG and SVG to LCX. Developed VF arrest and had re-do sternotomy begun at bedside with cardiac massage. 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 Underwent re-do CABG x1 with SVG to OM and placement of biVAD support due to shock.   Pt remained stable on biVAD support until she was taken to OR 11/24 for explantation of bivads and chest closure.  Hospital course additionally complicated by large hematoma with skin necrosis of LLE from open EVH harvest. Treated with ABX and wound care in hospital.   Seen in Dr. Cornelius Moraswen Office 11/24/16. Leg healing but continues to drain.  Noted have to used a large quantity of narcotic medication in a short period, so Ultram used going forward for pain management. Otherwise, patient noted to be stable.   She presents today for post hospital follow up. Weight shows up 4 lbs from discharge. Breathing has been OK. Has good and bad days. Gets around the house OK on a good day.  Has SOB with changing clothes or bathing. No orthopnea. Hasn't bent over with healing sternotomy. No exertional chest pain. No palpitations.  Has had a lot of left sided pain, occasional and sharp. Constantly sore, especially over chest. Has noticed minimal edema. No lightheadedness or dizziness. Feels like energy and appetite are both slowly improving.   Past Medical History:  Diagnosis Date  . Anxiety   . Bipolar 1 disorder (HCC)   . Cardiac arrest (HCC) 11/03/2016  . Cardiogenic shock (HCC) 11/03/2016  . Chronic pain   . Cocaine use   . Coronary artery disease    a. 03/2015 NSTEMI/PCI  in setting of cocaine use - BMS to diagonal. b. NSTEMI 12/2015 s/o overlapping DES to Cx, residual mild RCA and LAD disease, 75% OM1.  . Depression   . Dyslipidemia   . Migraine   . Osteoarthritis   . Polysubstance abuse    a. tobacco/cocaine  . 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 Scoliosis   . TIA (transient ischemic attack)     Current Outpatient Prescriptions  Medication Sig Dispense Refill  . acetaminophen (TYLENOL) 325 MG tablet Take 2 tablets (650 mg total) by mouth every 6 (six) hours as needed for mild pain or headache.    . ALPRAZolam (XANAX) 0.5 MG tablet Take 0.5 mg by mouth daily.     [name]. aspirin EC 81 MG EC tablet Take 1 tablet (81 mg total) by mouth daily.    [name]. atorvastatin (LIPITOR) 80 MG tablet Take 1 tablet (80 mg total) by mouth daily. 30 tablet 1  . buPROPion (WELLBUTRIN XL) 300 MG 24 hr tablet Take 300 mg by mouth every morning.  1  . cephALEXin (KEFLEX) 500 MG capsule Take 1 capsule (500 mg total) by mouth every 6 (six) hours. 30 capsule 0  . clopidogrel (PLAVIX) 75 MG tablet TAKE 1 TABLET BY MOUTH DAILY WITH BREAKFAST (Patient taking differently: Take 75 mg by mouth once a day) 30 tablet  10  . escitalopram (LEXAPRO) 20 MG tablet Take 20 mg by mouth daily.  3  . fluticasone (FLONASE) 50 MCG/ACT nasal spray Place 2 sprays into both nostrils daily as needed for allergies or rhinitis.    . furosemide (LASIX) 40 MG tablet Take 1 tablet (40 mg total) by mouth daily. If dizzy or weight continues to decrease, please stop. 30 tablet 1  . metoprolol tartrate (LOPRESSOR) 25 MG tablet Take 0.5 tablets (12.5 mg total) by mouth 2 (two) times daily. 30 tablet 1  . ondansetron (ZOFRAN-ODT) 4 MG disintegrating tablet Take 4 mg by mouth daily as needed for nausea or vomiting. DISSOLVE  3  . oxyCODONE (OXY IR/ROXICODONE) 5 MG immediate release tablet Take 1-2 tablets (5-10 mg total) by mouth every 4 (four) hours as needed for severe  pain. 30 tablet 0  . pantoprazole (PROTONIX) 40 MG tablet TAKE 1 TABLET BY MOUTH TWICE A DAY BEFORE MEALS (Patient taking differently: Take 40 mg by mouth two times a day before meals) 60 tablet 10  . spironolactone (ALDACTONE) 25 MG tablet Take 1 tablet (25 mg total) by mouth daily. 30 tablet 1  . traMADol (ULTRAM) 50 MG tablet Take 1 tablet (50 mg total) by mouth every 6 (six) hours as needed for moderate pain. 50 tablet 0   No current facility-administered medications for this encounter.     Allergies  Allergen Reactions  . Ranexa [Ranolazine] Nausea Only    Dizziness (only the 1,000 mg dose has this effect)      Social History   Social History  . Marital status: Divorced    Spouse name: N/A  . Number of children: N/A  . Years of education: N/A   Occupational History  . Not on file.   Social History Main Topics  . Smoking status: Current Some Day Smoker    Packs/day: 0.10    Types: Cigarettes  . Smokeless tobacco: Never Used  . Alcohol use No  . Drug use: No     Comment: Prior cocaine use  . Sexual activity: Not on file   Other Topics Concern  . Not on file   Social History Narrative  . No narrative on file      Family History  Problem Relation Age of Onset  . Mental retardation Mother   . Hypertension Mother   . Hyperlipidemia Mother   . Heart disease Mother   . Depression Mother   . Hypertension Father   . Hyperlipidemia Father   . Heart disease Father   . Mental retardation Father   . Diabetes Father   . Cancer Maternal Aunt   . Stroke Maternal Grandmother     Vitals:   11/26/16 0904  BP: 102/70  Pulse: 77  SpO2: 95%  Weight: 158 lb 9.6 oz (71.9 kg)   Wt Readings from Last 3 Encounters:  11/26/16 158 lb 9.6 oz (71.9 kg)  11/24/16 154 lb (69.9 kg)  11/17/16 154 lb 12.8 oz (70.2 kg)    PHYSICAL EXAM: General:  NAD. In WC.  HEENT: Normal Neck: Supple. JVP does not appear elevated. Carotids 2+ bilat; no bruits. No lymphadenopathy or   Thyromegaly noted.  Cor: PMI nondisplaced. RRR. No M/G/R noted.  Lungs: Slightly diminished basilar sounds.  Abdomen: soft, NT, ND, no HSM. No bruits or masses. +BS  Extremities: no cyanosis, clubbing, rash. Trace to 1+ ankle edema.  Neuro: alert & oriented x 3, cranial nerves grossly intact. moves all 4 extremities w/o difficulty. Affect pleasant.  ASSESSMENT & PLAN:  1. CAD s/p CABG with emergent re-do requiring BIVAD support.  - Stable. Remains sore but wound care on-going.  - Pre-occupied with getting her pain medications. Asked several times during visit today.  - Continue Lopressor 12.5 mg BID. Will not push today with pressures soft. Were in 90s at CT surgery visit.  - Remains on Keflex for wounds. Per CT surgery. 2. Combined Systolic/Diastolic Biventricular HF - EF stable by Echo prior to discharge as below.  - Echo 11/13/2016  EF 50-55% RV not seen very well but looks normal. Much improved after CABG and explant of BiVAD support.  - Volume status looks stable.  NYHA III symptoms, but suspect some guarding with on going soreness and sternal precautions.  - Continue  Lasix 40 mg daily. Can take extra as needed for weight gain. BMET/BNP today - Continue spironolactone 25 mg daily.  - Can repeat echo in 4-6 months to look at both ventricles again.   Doing well overall, still having some pain, but having trouble connecting with PCP due to Medicaid to get her Oxy. Per CT surgery note, pt used 30 Oxy in 3 days after discharge.   BMET, BNP, and CBC today.   Follow up 4-6 weeks with Dr. Gala RomneyBensimhon.  Graciella FreerMichael Andrew [name], PA-C   Total time spent > 25 minutes. Over half that spent discussing the above.

## 2016-11-26 NOTE — Patient Instructions (Signed)
Labs today We will only contact you if something comes back abnormal or we need to make some changes. Otherwise no news is good news!    Your physician recommends that you schedule a follow-up appointment in: 4-6 weeks with Dr Bensimhon  Do the following things EVERYDAY: 1) Weigh yourself in the morning before breakfast. Write it down and keep it in a log. 2) Take your medicines as prescribed 3) Eat low salt foods-Limit salt (sodium) to 2000 mg per day.  4) Stay as active as you can everyday 5) Limit all fluids for the day to less than 2 liters   

## 2016-11-26 NOTE — Addendum Note (Signed)
Encounter addended by: Graciella FreerMichael Andrew Dylann Layne, PA-C on: 11/26/2016 11:43 AM<BR>    Actions taken: Sign clinical note

## 2016-12-01 ENCOUNTER — Ambulatory Visit (INDEPENDENT_AMBULATORY_CARE_PROVIDER_SITE_OTHER): Payer: Self-pay | Admitting: Physician Assistant

## 2016-12-01 ENCOUNTER — Telehealth (HOSPITAL_COMMUNITY): Payer: Self-pay

## 2016-12-01 VITALS — BP 97/68 | HR 74 | Temp 97.1°F | Resp 20 | Ht 62.0 in | Wt 157.0 lb

## 2016-12-01 DIAGNOSIS — G8918 Other acute postprocedural pain: Secondary | ICD-10-CM

## 2016-12-01 DIAGNOSIS — Z951 Presence of aortocoronary bypass graft: Secondary | ICD-10-CM

## 2016-12-01 MED ORDER — TRAMADOL HCL 50 MG PO TABS: 50.0000 mg | ORAL_TABLET | Freq: Four times a day (QID) | ORAL | 0 refills | Status: DC | PRN

## 2016-12-01 NOTE — Progress Notes (Signed)
HPI:  Patient returns for complaints of her left thigh wound opening up.  She was evaluated last Monday 11/24/2016 fat which time her staples were removed and there was a mild dehiscence in her thigh wound.  She states that Friday her wound completely opened with separation of the dark piece of skin.  She denies fevers, chills, and sweats.  She denies purulent drainage.   Current Outpatient Prescriptions  Medication Sig Dispense Refill  . acetaminophen (TYLENOL) 325 MG tablet Take 2 tablets (650 mg total) by mouth every 6 (six) hours as needed for mild pain or headache.    . ALPRAZolam (XANAX) 0.5 MG tablet Take 0.5 mg by mouth daily.     [name]. aspirin EC 81 MG EC tablet Take 1 tablet (81 mg total) by mouth daily.    [name]. atorvastatin (LIPITOR) 80 MG tablet Take 1 tablet (80 mg total) by mouth daily. 30 tablet 1  . buPROPion (WELLBUTRIN XL) 300 MG 24 hr tablet Take 300 mg by mouth every morning.  1  . cephALEXin (KEFLEX) 500 MG capsule Take 1 capsule (500 mg total) by mouth every 6 (six) hours. 30 capsule 0  . clopidogrel (PLAVIX) 75 MG tablet TAKE 1 TABLET BY MOUTH DAILY WITH BREAKFAST (Patient taking differently: Take 75 mg by mouth once a day) 30 tablet 10  . escitalopram (LEXAPRO) 20 MG tablet Take 20 mg by mouth daily.  3  . fluticasone (FLONASE) 50 MCG/ACT nasal spray Place 2 sprays into both nostrils daily as needed for allergies or rhinitis.    . furosemide (LASIX) 40 MG tablet Take 1 tablet (40 mg total) by mouth daily. If dizzy or weight continues to decrease, please stop. 30 tablet 1  . metoprolol tartrate (LOPRESSOR) 25 MG tablet Take 0.5 tablets (12.5 mg total) by mouth 2 (two) times daily. 30 tablet 1  . ondansetron (ZOFRAN-ODT) 4 MG disintegrating tablet Take 4 mg by mouth daily as needed for nausea or vomiting. DISSOLVE  3  . oxyCODONE (OXY IR/ROXICODONE) 5 MG immediate release tablet Take 1-2 tablets (5-10 mg total) by mouth every 4 (four) hours as needed for severe pain. 30 tablet 0   . pantoprazole (PROTONIX) 40 MG tablet TAKE 1 TABLET BY MOUTH TWICE A DAY BEFORE MEALS (Patient taking differently: Take 40 mg by mouth two times a day before meals) 60 tablet 10  . spironolactone (ALDACTONE) 25 MG tablet Take 1 tablet (25 mg total) by mouth daily. 30 tablet 1  . traMADol (ULTRAM) 50 MG tablet Take 1 tablet (50 mg total) by mouth every 6 (six) hours as needed for moderate pain. 50 tablet 0   No current facility-administered medications for this visit.     Physical Exam:  BP 97/68   Pulse 74   Temp 97.1 F (36.2 C) (Oral)   Resp 20   Ht 5\' 2"  (1.575 m)   Wt 157 lb (71.2 kg)   LMP 10/27/2016 (Exact Date)   SpO2 96%   BMI 28.72 kg/m   Gen: no apparent distress Heart:RRR Lungs: CTA Left Thigh- wound is dehisced with yellow slough present and an area of necrotic tissue is present.  Sternotomy is healing, with residual eschar preset  A/P:  1. Dehiscence Left Thigh- wound debrided and necrotic tissue removed.  Wound measures 12*3*1---- will need wound vac therapy to aid in wound healing. We will arrange home therapy.  Patient is to perform wet to dry dressing changes until vac can be arranged 2. Chronic Pain- will give  a refill for Ultram, however we will not provide patient Oxycodone 3. Dispo- RTC on 12/22/16 as previously scheduled   Lowella DandyBARRETT, [place] Grayer, PA-C [facility] [phone]

## 2016-12-01 NOTE — Telephone Encounter (Signed)
Christina Morrison Nat Peptide  Order: [id number]  Status:  Final result Visible to patient:  Yes (MyChart) Dx:  CAD S/P percutaneous coronary angiopl...  Notes Recorded by Chyrl CivatteMegan G Bradley, RN on 12/01/2016 at 4:47 PM EST Patient made aware and agreeable. ------  Notes Recorded by Theresia Boughhantel M Jeffries, CMA on 11/27/2016 at 3:26 PM EST Unable to reach patient. Unable to reach patient. No answer unable to leave message [phone] ------  Notes Recorded by Theresia Boughhantel M Jeffries, CMA on 11/26/2016 at 4:45 PM EST Unable to reach patient. No answer unable to leave message [phone]  ------  Notes Recorded by Graciella FreerMichael Andrew [name], PA-C on 11/26/2016 at 11:42 AM EST Weight was up a few lbs and had some mild edema. With BNP elevation, please have her take extra 40 mg of lasix x 2 days and see if she feels better. Can repeat as needed as discussed in clinic today.    [name], PA-C 11/26/2016 11:42 AM    Ref Range & Units 5d ago 2687yr ago   B Natriuretic Peptide 0.0 - 100.0 pg/mL 539.0

## 2016-12-01 NOTE — Patient Instructions (Addendum)
You may return to driving an automobile as long as you are no longer requiring oral narcotic pain relievers during the daytime.  It would be wise to start driving only short distances during the daylight and gradually increase from there as you feel comfortable.  Make every effort to stay physically active, get some type of exercise on a regular basis, and stick to a "heart healthy diet".  The long term benefits for regular exercise and a healthy diet are critically important to your overall health and wellbeing. You may gradually increase your physical activity as tolerated without any particular limitations at this time.  Continue to wash your incisions with soap and water. Dry with a clean towel.

## 2016-12-19 ENCOUNTER — Other Ambulatory Visit: Payer: Self-pay | Admitting: Thoracic Surgery (Cardiothoracic Vascular Surgery)

## 2016-12-19 DIAGNOSIS — Z951 Presence of aortocoronary bypass graft: Secondary | ICD-10-CM

## 2016-12-22 ENCOUNTER — Ambulatory Visit (INDEPENDENT_AMBULATORY_CARE_PROVIDER_SITE_OTHER): Payer: Self-pay | Admitting: Thoracic Surgery (Cardiothoracic Vascular Surgery)

## 2016-12-22 ENCOUNTER — Encounter: Payer: Self-pay | Admitting: Thoracic Surgery (Cardiothoracic Vascular Surgery)

## 2016-12-22 ENCOUNTER — Ambulatory Visit
Admission: RE | Admit: 2016-12-22 | Discharge: 2016-12-22 | Disposition: A | Discharge status: Home / Self Care | Payer: Medicaid Other | Source: Ambulatory Visit | Attending: Thoracic Surgery (Cardiothoracic Vascular Surgery) | Admitting: Thoracic Surgery (Cardiothoracic Vascular Surgery)

## 2016-12-22 VITALS — BP 120/74 | HR 78 | Resp 16 | Ht 63.0 in | Wt 170.4 lb

## 2016-12-22 DIAGNOSIS — Z951 Presence of aortocoronary bypass graft: Secondary | ICD-10-CM

## 2016-12-22 DIAGNOSIS — T8131XD Disruption of external operation (surgical) wound, not elsewhere classified, subsequent encounter: Secondary | ICD-10-CM

## 2016-12-22 MED ORDER — FUROSEMIDE 40 MG PO TABS: 40.0000 mg | ORAL_TABLET | Freq: Two times a day (BID) | ORAL | 1 refills | Status: DC

## 2016-12-22 MED ORDER — POTASSIUM CHLORIDE ER 10 MEQ PO TBCR: 20.0000 meq | EXTENDED_RELEASE_TABLET | Freq: Every day | ORAL | 1 refills | Status: DC

## 2016-12-22 NOTE — Patient Instructions (Signed)
Increase your dose of Lasix (furosemide) to 1 tablet by mouth twice daily and start taking potassium supplements  Continue all other previous medications without any changes at this time  Monitor your weight on a daily basis and look for signs of fluid retention, such as swelling around your ankles  Continue to avoid any heavy lifting or strenuous use of your arms or shoulders for at least a total of three months from the time of surgery.  After three months you may gradually increase how much you lift or otherwise use your arms or chest as tolerated, with limits based upon whether or not activities lead to the return of significant discomfort.

## 2016-12-22 NOTE — Progress Notes (Signed)
[address]       [place] [id number]             [phone]     CARDIOTHORACIC SURGERY OFFICE NOTE  Referring Provider is Tonny Bollmanooper, Michael, MD PCP is Pearson GrippeJames Kim, MD   HPI:  Patient is a 41 year old female with history of coronary artery disease status post PCI and stenting on multiple previous occasions, hyperlipidemia, long-standing tobacco abuse, strong family history of coronary artery disease, bipolar disorder, and chronic pain with long-standing oral narcotic abuse and substance abuse who returns to the office today for follow-up status post coronary artery bypass grafting times 3 on 11/03/2016 for severe 2 vessel coronary artery disease with unstable angina pectoris.  The patient's early postoperative recovery was notable for sudden cardiac arrest in the ICU requiring emergency sternotomy at the bedside with open chest cardiac massage and return to the operating room where she underwent redo coronary artery bypass grafting 1 including placement of a reversed greater saphenous vein graft to the second obtuse marginal branch of left circumflex coronary artery secondary to acute evolving myocardial infarction.  She was noted to have severe cardiogenic shock and required placement of biventricular assist device for mechanical circulatory support. Her sternotomy we initially closed using a temporary Esmarch dressing. She made a remarkably good recovery and ultimately underwent sternal washout with delayed primary closure on the fourth postoperative day.  The remainder of her postoperative recovery in the hospital was remarkably uncomplicated and she was eventually discharged home on the 10th postoperative day. Since hospital discharge she has been seen in follow-up at the advanced heart failure clinic on 10/27/2016. She developed significant skin necrosis involving her left thigh vein harvest incision which eventually required open debridement when she was seen in follow-up  in the office on 12/01/2016.  A wound VAC was placed and the patient has been followed ever since by home health wound care. She returns to our office for routine follow-up today. She reports that she is doing remarkably well. She still gets short of breath with exertion, but overall she is making good progress. She has had only mild residual pain in her chest.  She still takes occasional oxycodone and tramadol more commonly. She has not had any exertional chest pain or chest pain similar to that which she had prior to surgery. Her weight has increased several pounds and she has developed some lower extremity edema. Appetite is pretty good. She is not walking very much. Overall she is pleased with her progress.   Current Outpatient Prescriptions  Medication Sig Dispense Refill  . acetaminophen (TYLENOL) 325 MG tablet Take 2 tablets (650 mg total) by mouth every 6 (six) hours as needed for mild pain or headache.    . ALPRAZolam (XANAX) 0.5 MG tablet Take 0.5 mg by mouth daily.     [name]. aspirin EC 81 MG EC tablet Take 1 tablet (81 mg total) by mouth daily.    [name]. atorvastatin (LIPITOR) 80 MG tablet Take 1 tablet (80 mg total) by mouth daily. 30 tablet 1  . buPROPion (WELLBUTRIN XL) 300 MG 24 hr tablet Take 300 mg by mouth every morning.  1  . clopidogrel (PLAVIX) 75 MG tablet TAKE 1 TABLET BY MOUTH DAILY WITH BREAKFAST (Patient taking differently: Take 75 mg by mouth once a day) 30 tablet 10  . escitalopram (LEXAPRO) 20 MG tablet Take 20 mg by mouth daily.  3  . fluticasone (FLONASE) 50 MCG/ACT nasal spray Place  2 sprays into both nostrils daily as needed for allergies or rhinitis.    . furosemide (LASIX) 40 MG tablet Take 1 tablet (40 mg total) by mouth daily. If dizzy or weight continues to decrease, please stop. 30 tablet 1  . metoprolol tartrate (LOPRESSOR) 25 MG tablet Take 0.5 tablets (12.5 mg total) by mouth 2 (two) times daily. 30 tablet 1  . ondansetron (ZOFRAN-ODT) 4 MG disintegrating tablet Take 4  mg by mouth daily as needed for nausea or vomiting. DISSOLVE  3  . pantoprazole (PROTONIX) 40 MG tablet TAKE 1 TABLET BY MOUTH TWICE A DAY BEFORE MEALS (Patient taking differently: Take 40 mg by mouth two times a day before meals) 60 tablet 10  . spironolactone (ALDACTONE) 25 MG tablet Take 1 tablet (25 mg total) by mouth daily. 30 tablet 1  . traMADol (ULTRAM) 50 MG tablet Take 1 tablet (50 mg total) by mouth every 6 (six) hours as needed for moderate pain. 20 tablet 0   No current facility-administered medications for this visit.       Physical Exam:   BP 120/74 (BP Location: Right Arm, Patient Position: Sitting, Cuff Size: Large)   Pulse 78   Resp 16   Ht 5\' 3"  (1.6 m)   Wt 170 lb 6.4 oz (77.3 kg)   SpO2 97% Comment: ON RA  BMI 30.19 kg/m   General:  Well-appearing  Chest:   Clear to auscultation  CV:   Regular rate and rhythm without murmur  Incisions:  Sternotomy incision is healing nicely and sternum is stable. All lower extremity incisions have healed nicely with exception of the left thigh wound. The left thigh wound is clean and granulating nicely. There is mild exudate at the base at the top. The the wound has contracted already and is noticeably smaller than it was initially after debridement.  Abdomen:  Soft nontender  Extremities:  Warm and well-perfused with mild lower extremity edema.  Diagnostic Tests:  CHEST  2 VIEW  COMPARISON:  11/24/2016  FINDINGS: Cardiomediastinal silhouette unchanged in size and contour. Surgical changes of median sternotomy and CABG.  Calcifications of coronary vasculature.  Blunting of the left costophrenic angle, with no blunting on the lateral view within the costophrenic sulcus.  Trace thickening of the fissure on the lateral view.  Linear opacity the right mid lung.  No displaced fracture.  IMPRESSION: Postsurgical changes of median sternotomy and CABG, with blunting at the left costophrenic angle favored to  reflect scarring/atelectasis.  Atelectasis/ scarring in the right mid lung with no evidence of lobar pneumonia.  Signed,  Yvone Neu. Loreta Ave, DO  [facility]  [facility]   Electronically Signed   By: Gilmer Mor D.O.   On: 12/22/2016 11:59   Impression:  Patient is clinically stable and overall doing remarkably well more than 6 weeks following coronary artery bypass grafting complicated by perioperative myocardial infarction with cardiac arrest and subsequent cardiogenic shock.  The patient's left thigh wound is granulating in nicely and responding to wound therapy at home. Her weight has been creeping up and she still has some exertional shortness of breath. She may need higher dose of diuretic.  Plan:  I have instructed the patient to increase her dose of Lasix to 40 mg by mouth twice daily. She will remain on spironolactone. I have not recommended any other changes to her current medications. I've encouraged the patient to walk more. Wound VAC therapy will continue without changes at this time.  Patient has been reminded to avoid any heavy lifting or strenuous use of her arms or shoulders. She will return to our office for follow-up in approximately 6 weeks or sooner should specific problems or questions arise. She will continue to follow-up in the advanced heart failure clinic as previously arranged.    Salvatore Decent. Cornelius Moras, MD 12/22/2016 12:23 PM

## 2017-01-06 ENCOUNTER — Other Ambulatory Visit: Payer: Self-pay | Admitting: Physician Assistant

## 2017-01-07 ENCOUNTER — Ambulatory Visit (HOSPITAL_COMMUNITY)
Admission: RE | Admit: 2017-01-07 | Discharge: 2017-01-07 | Disposition: A | Discharge status: Home / Self Care | Payer: Medicaid Other | Source: Ambulatory Visit | Attending: Internal Medicine | Admitting: Internal Medicine

## 2017-01-07 ENCOUNTER — Encounter (HOSPITAL_COMMUNITY): Payer: Self-pay | Admitting: Internal Medicine

## 2017-01-07 VITALS — BP 122/76 | HR 84 | Wt 157.0 lb

## 2017-01-07 DIAGNOSIS — F419 Anxiety disorder, unspecified: Secondary | ICD-10-CM | POA: Insufficient documentation

## 2017-01-07 DIAGNOSIS — Z888 Allergy status to other drugs, medicaments and biological substances status: Secondary | ICD-10-CM | POA: Diagnosis not present

## 2017-01-07 DIAGNOSIS — E785 Hyperlipidemia, unspecified: Secondary | ICD-10-CM | POA: Insufficient documentation

## 2017-01-07 DIAGNOSIS — Z8249 Family history of ischemic heart disease and other diseases of the circulatory system: Secondary | ICD-10-CM | POA: Diagnosis not present

## 2017-01-07 DIAGNOSIS — Z7902 Long term (current) use of antithrombotics/antiplatelets: Secondary | ICD-10-CM | POA: Diagnosis not present

## 2017-01-07 DIAGNOSIS — I2583 Coronary atherosclerosis due to lipid rich plaque: Secondary | ICD-10-CM

## 2017-01-07 DIAGNOSIS — I5041 Acute combined systolic (congestive) and diastolic (congestive) heart failure: Secondary | ICD-10-CM | POA: Diagnosis not present

## 2017-01-07 DIAGNOSIS — Z823 Family history of stroke: Secondary | ICD-10-CM | POA: Diagnosis not present

## 2017-01-07 DIAGNOSIS — F17210 Nicotine dependence, cigarettes, uncomplicated: Secondary | ICD-10-CM | POA: Insufficient documentation

## 2017-01-07 DIAGNOSIS — Z810 Family history of intellectual disabilities: Secondary | ICD-10-CM | POA: Insufficient documentation

## 2017-01-07 DIAGNOSIS — Z809 Family history of malignant neoplasm, unspecified: Secondary | ICD-10-CM | POA: Diagnosis not present

## 2017-01-07 DIAGNOSIS — I2510 Atherosclerotic heart disease of native coronary artery without angina pectoris: Secondary | ICD-10-CM | POA: Diagnosis not present

## 2017-01-07 DIAGNOSIS — I5022 Chronic systolic (congestive) heart failure: Secondary | ICD-10-CM | POA: Diagnosis not present

## 2017-01-07 DIAGNOSIS — I252 Old myocardial infarction: Secondary | ICD-10-CM | POA: Insufficient documentation

## 2017-01-07 DIAGNOSIS — Z833 Family history of diabetes mellitus: Secondary | ICD-10-CM | POA: Insufficient documentation

## 2017-01-07 DIAGNOSIS — Z7982 Long term (current) use of aspirin: Secondary | ICD-10-CM | POA: Diagnosis not present

## 2017-01-07 DIAGNOSIS — M1990 Unspecified osteoarthritis, unspecified site: Secondary | ICD-10-CM | POA: Insufficient documentation

## 2017-01-07 DIAGNOSIS — I214 Non-ST elevation (NSTEMI) myocardial infarction: Secondary | ICD-10-CM | POA: Diagnosis present

## 2017-01-07 DIAGNOSIS — Z8674 Personal history of sudden cardiac arrest: Secondary | ICD-10-CM | POA: Diagnosis not present

## 2017-01-07 DIAGNOSIS — Z8673 Personal history of transient ischemic attack (TIA), and cerebral infarction without residual deficits: Secondary | ICD-10-CM | POA: Diagnosis not present

## 2017-01-07 DIAGNOSIS — Z9889 Other specified postprocedural states: Secondary | ICD-10-CM | POA: Insufficient documentation

## 2017-01-07 DIAGNOSIS — F319 Bipolar disorder, unspecified: Secondary | ICD-10-CM | POA: Diagnosis not present

## 2017-01-07 DIAGNOSIS — Z951 Presence of aortocoronary bypass graft: Secondary | ICD-10-CM | POA: Insufficient documentation

## 2017-01-07 DIAGNOSIS — I110 Hypertensive heart disease with heart failure: Secondary | ICD-10-CM | POA: Diagnosis not present

## 2017-01-07 DIAGNOSIS — M419 Scoliosis, unspecified: Secondary | ICD-10-CM | POA: Diagnosis not present

## 2017-01-07 LAB — CBC
HCT: 42.4 % (ref 36.0–46.0)
HEMOGLOBIN: 14.2 g/dL (ref 12.0–15.0)
MCH: 30.9 pg (ref 26.0–34.0)
MCHC: 33.5 g/dL (ref 30.0–36.0)
MCV: 92.2 fL (ref 78.0–100.0)
Platelets: 229 10*3/uL (ref 150–400)
RBC: 4.6 MIL/uL (ref 3.87–5.11)
RDW: 14.2 % (ref 11.5–15.5)
WBC: 6.7 10*3/uL (ref 4.0–10.5)

## 2017-01-07 LAB — BASIC METABOLIC PANEL
ANION GAP: 9 (ref 5–15)
BUN: 12 mg/dL (ref 6–20)
CALCIUM: 9.7 mg/dL (ref 8.9–10.3)
CO2: 26 mmol/L (ref 22–32)
Chloride: 102 mmol/L (ref 101–111)
Creatinine, Ser: 0.83 mg/dL (ref 0.44–1.00)
GFR calc Af Amer: >60 mL/min (ref >60)
GFR calc non Af Amer: >60 mL/min (ref >60)
GLUCOSE: 93 mg/dL (ref 65–99)
Potassium: 4.2 mmol/L (ref 3.5–5.1)
Sodium: 137 mmol/L (ref 135–145)

## 2017-01-07 MED ORDER — FUROSEMIDE 40 MG PO TABS: 40.0000 mg | ORAL_TABLET | Freq: Two times a day (BID) | ORAL | 3 refills | Status: DC

## 2017-01-07 NOTE — Addendum Note (Signed)
Encounter addended by: Noralee SpaceHeather M Nikalas Bramel, RN on: 01/07/2017  2:33 PM<BR>    Actions taken: Order list changed, Diagnosis association updated, Sign clinical note

## 2017-01-07 NOTE — Patient Instructions (Signed)
Take Furosemide 40 mg Twice daily   Labs today  Your physician recommends that you schedule a follow-up appointment in: 6 weeks with echocardiogram

## 2017-01-07 NOTE — Progress Notes (Signed)
Advanced Heart Failure Clinic Note   Primary Care: Dr. Pearson Grippe  Primary Cardiologist: Dr Excell Seltzer Primary HF: Dr. Gala Romney  CT Surgeon: Dr. Cornelius Moras  HPI:  Christina Morrison is a 41 y.o. female with HTN, CAD and tobacco use. Pt underwent stenting of LCX in 1/17. Readmitted for NSTEMI. Found to have 2v CAD with high-grade ostial LAD and LCX stensosis.   Pt underwent CABG 11/03/16 with Dr. Cornelius Moras with LIMA to LAD, SVG to DIAG and SVG to LCX. Developed VF arrest and had re-do sternotomy begun at bedside with cardiac massage. 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 Underwent re-do CABG x1 with SVG to OM and placement of biVAD support due to shock.  Hospital course additionally complicated by large hematoma with skin necrosis of LLE from open EVH harvest. Treated with ABX and wound care in hospital.    She presents today for follow up. Doing well. Says she feels fine. Still with wound vac for leg but it is healing. Able to her activities without much problem. Was taking lasix 40 daily and had swelling so doubled to 40 bid. Now out for 2 days.  Weight stable now. Chest soreness improving. Occasional dizziness. Not going to CR. Smoking 1 cigarette/day.   Past Medical History:  Diagnosis Date  . Anxiety   . Bipolar 1 disorder (HCC)   . Cardiac arrest (HCC) 11/03/2016  . Cardiogenic shock (HCC) 11/03/2016  . Chronic pain   . Cocaine use   . Coronary artery disease    a. 03/2015 NSTEMI/PCI in setting of cocaine use - BMS to diagonal. b. NSTEMI 12/2015 s/o overlapping DES to Cx, residual mild RCA and LAD disease, 75% OM1.  . Depression   . Dyslipidemia   . Migraine   . Osteoarthritis   . Polysubstance abuse    a. tobacco/cocaine  . 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 Scoliosis   . TIA (transient ischemic attack)     Current Outpatient Prescriptions  Medication Sig Dispense Refill  . acetaminophen (TYLENOL) 325 MG tablet Take 2 tablets (650  mg total) by mouth every 6 (six) hours as needed for mild pain or headache.    . ALPRAZolam (XANAX) 0.5 MG tablet Take 0.5 mg by mouth daily.     [name] aspirin EC 81 MG EC tablet Take 1 tablet (81 mg total) by mouth daily.    [name] atorvastatin (LIPITOR) 80 MG tablet Take 1 tablet (80 mg total) by mouth daily. 30 tablet 1  . buPROPion (WELLBUTRIN XL) 300 MG 24 hr tablet Take 300 mg by mouth every morning.  1  . clopidogrel (PLAVIX) 75 MG tablet TAKE 1 TABLET BY MOUTH DAILY WITH BREAKFAST 30 tablet 10  . escitalopram (LEXAPRO) 20 MG tablet Take 20 mg by mouth daily.  3  . fluticasone (FLONASE) 50 MCG/ACT nasal spray Place 2 sprays into both nostrils daily as needed for allergies or rhinitis.    . furosemide (LASIX) 40 MG tablet Take 1 tablet (40 mg total) by mouth 2 (two) times daily. If dizzy or weight continues to decrease, please stop. 60 tablet 1  . metoprolol tartrate (LOPRESSOR) 25 MG tablet Take 0.5 tablets (12.5 mg total) by mouth 2 (two) times daily. 30 tablet 1  . ondansetron (ZOFRAN-ODT) 4 MG disintegrating tablet Take 4 mg by mouth daily as needed for nausea or vomiting. DISSOLVE  3  . pantoprazole (PROTONIX) 40 MG  tablet TAKE 1 TABLET BY MOUTH TWICE A DAY BEFORE MEALS (Patient taking differently: Take 40 mg by mouth two times a day before meals) 60 tablet 10  . potassium chloride (K-DUR) 10 MEQ tablet Take 2 tablets (20 mEq total) by mouth daily. 60 tablet 1  . spironolactone (ALDACTONE) 25 MG tablet Take 1 tablet (25 mg total) by mouth daily. 30 tablet 1  . traMADol (ULTRAM) 50 MG tablet Take 1 tablet (50 mg total) by mouth every 6 (six) hours as needed for moderate pain. 20 tablet 0   No current facility-administered medications for this encounter.     Allergies  Allergen Reactions  . Ranexa [Ranolazine] Nausea Only    Dizziness (only the 1,000 mg dose has this effect)      Social History   Social History  . Marital status: Divorced    Spouse name: N/A  . Number of children: N/A    . Years of education: N/A   Occupational History  . Not on file.   Social History Main Topics  . Smoking status: Current Some Day Smoker    Packs/day: 0.10    Types: Cigarettes  . Smokeless tobacco: Never Used  . Alcohol use No  . Drug use: No     Comment: Prior cocaine use  . Sexual activity: Not on file   Other Topics Concern  . Not on file   Social History Narrative  . No narrative on file      Family History  Problem Relation Age of Onset  . Mental retardation Mother   . Hypertension Mother   . Hyperlipidemia Mother   . Heart disease Mother   . Depression Mother   . Hypertension Father   . Hyperlipidemia Father   . Heart disease Father   . Mental retardation Father   . Diabetes Father   . Cancer Maternal Aunt   . Stroke Maternal Grandmother     Vitals:   01/07/17 1407  BP: 122/76  Pulse: 84  SpO2: 99%  Weight: 157 lb (71.2 kg)   Wt Readings from Last 3 Encounters:  01/07/17 157 lb (71.2 kg)  12/22/16 170 lb 6.4 oz (77.3 kg)  12/01/16 157 lb (71.2 kg)    PHYSICAL EXAM: General:  NAD. HEENT: Normal Neck: Supple. JVP does not appear elevated. Carotids 2+ bilat; no bruits. No lymphadenopathy or  Thyromegaly noted.  Cor: PMI nondisplaced. RRR. No M/G/R noted.  Lungs: Clear Abdomen: soft, NT, ND, no HSM. No bruits or masses. +BS  Extremities: no cyanosis, clubbing, rash. Trace ankle edema. Left leg wound with wound vac Neuro: alert & oriented x 3, cranial nerves grossly intact. moves all 4 extremities w/o difficulty. Affect pleasant.   ASSESSMENT & PLAN:  1. CAD s/p CABG with emergent re-do requiring BIVAD support.  - Stable. Continue ASA, plavix, statin. Will need clearance from Dr. Cornelius Moraswen to go to CR.  - Continue Lopressor 12.5 mg BID. Will not push today with pressures soft. Were in 90s at CT surgery visit.  2. Combined Systolic/Diastolic Biventricular HF - EF stable by Echo prior to discharge as below.  - Echo 11/13/2016  EF 50-55% RV not seen  very well but looks normal.  - Volume status looks stable.  NYHA II - Continue lasix 40 mg bid. Can adjust as needed for swelling - Continue spironolactone 25 mg daily.  - Repeat echo at next visit - Labs today 3. Tobacco use - Encouraged to quite completely.  Eventually can f/u with Dr.  Cooper.   Arvilla Meres, MD

## 2017-01-07 NOTE — Addendum Note (Signed)
Encounter addended by: Noralee SpaceHeather M Jawana Reagor, RN on: 01/07/2017  2:37 PM<BR>    Actions taken: Order list changed

## 2017-01-15 ENCOUNTER — Other Ambulatory Visit: Payer: Self-pay | Admitting: Cardiovascular Disease

## 2017-01-15 ENCOUNTER — Other Ambulatory Visit: Payer: Self-pay | Admitting: Physician Assistant

## 2017-01-15 IMAGING — CR DG CHEST 2V
2 series · 2 of 2 positions shown · non-contrast
Comparison: 11/24/2016

CLINICAL DATA: 40-year-old female with a history of CABG and
shortness of breath

EXAM:
CHEST  2 VIEW

[w chest pa]
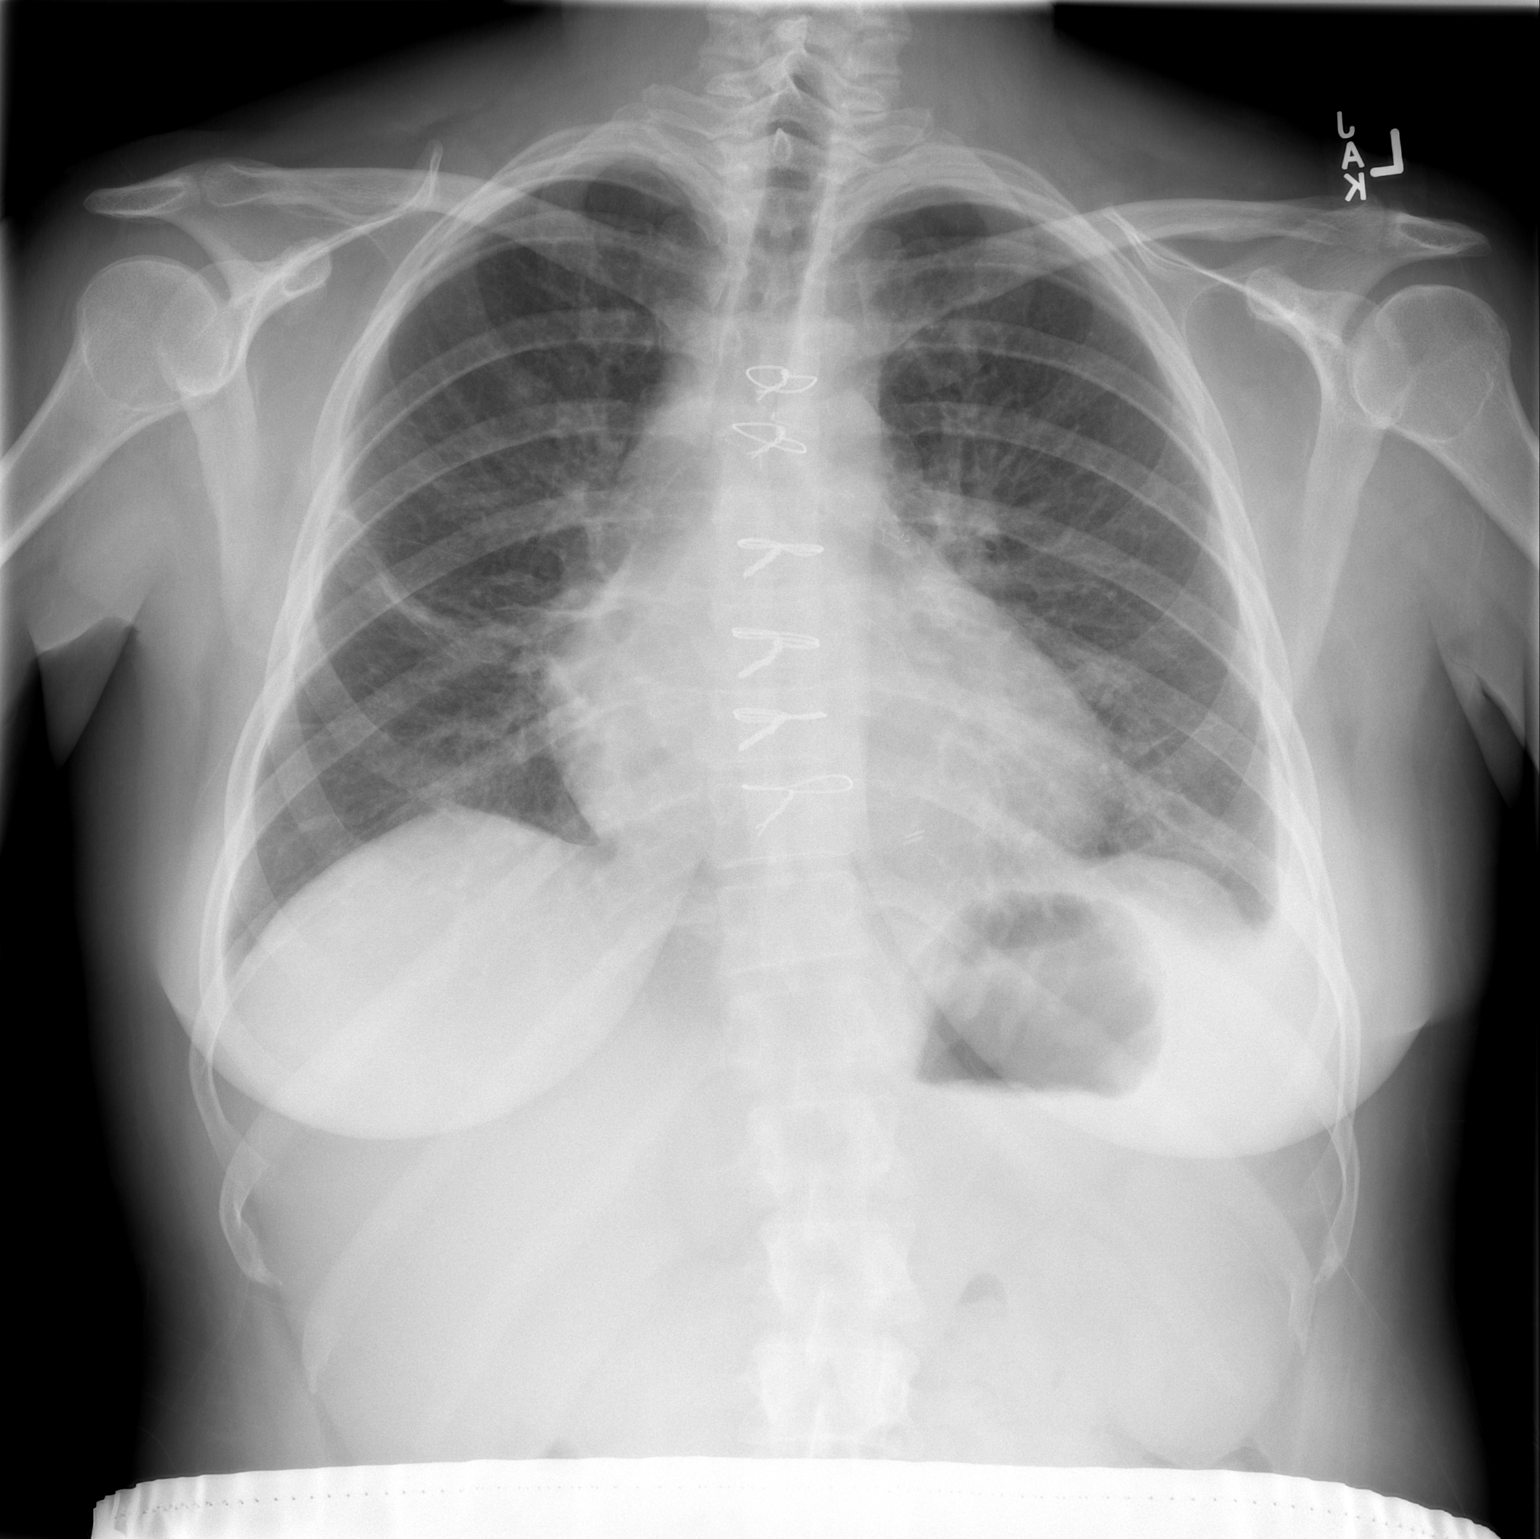

[w chest lat]
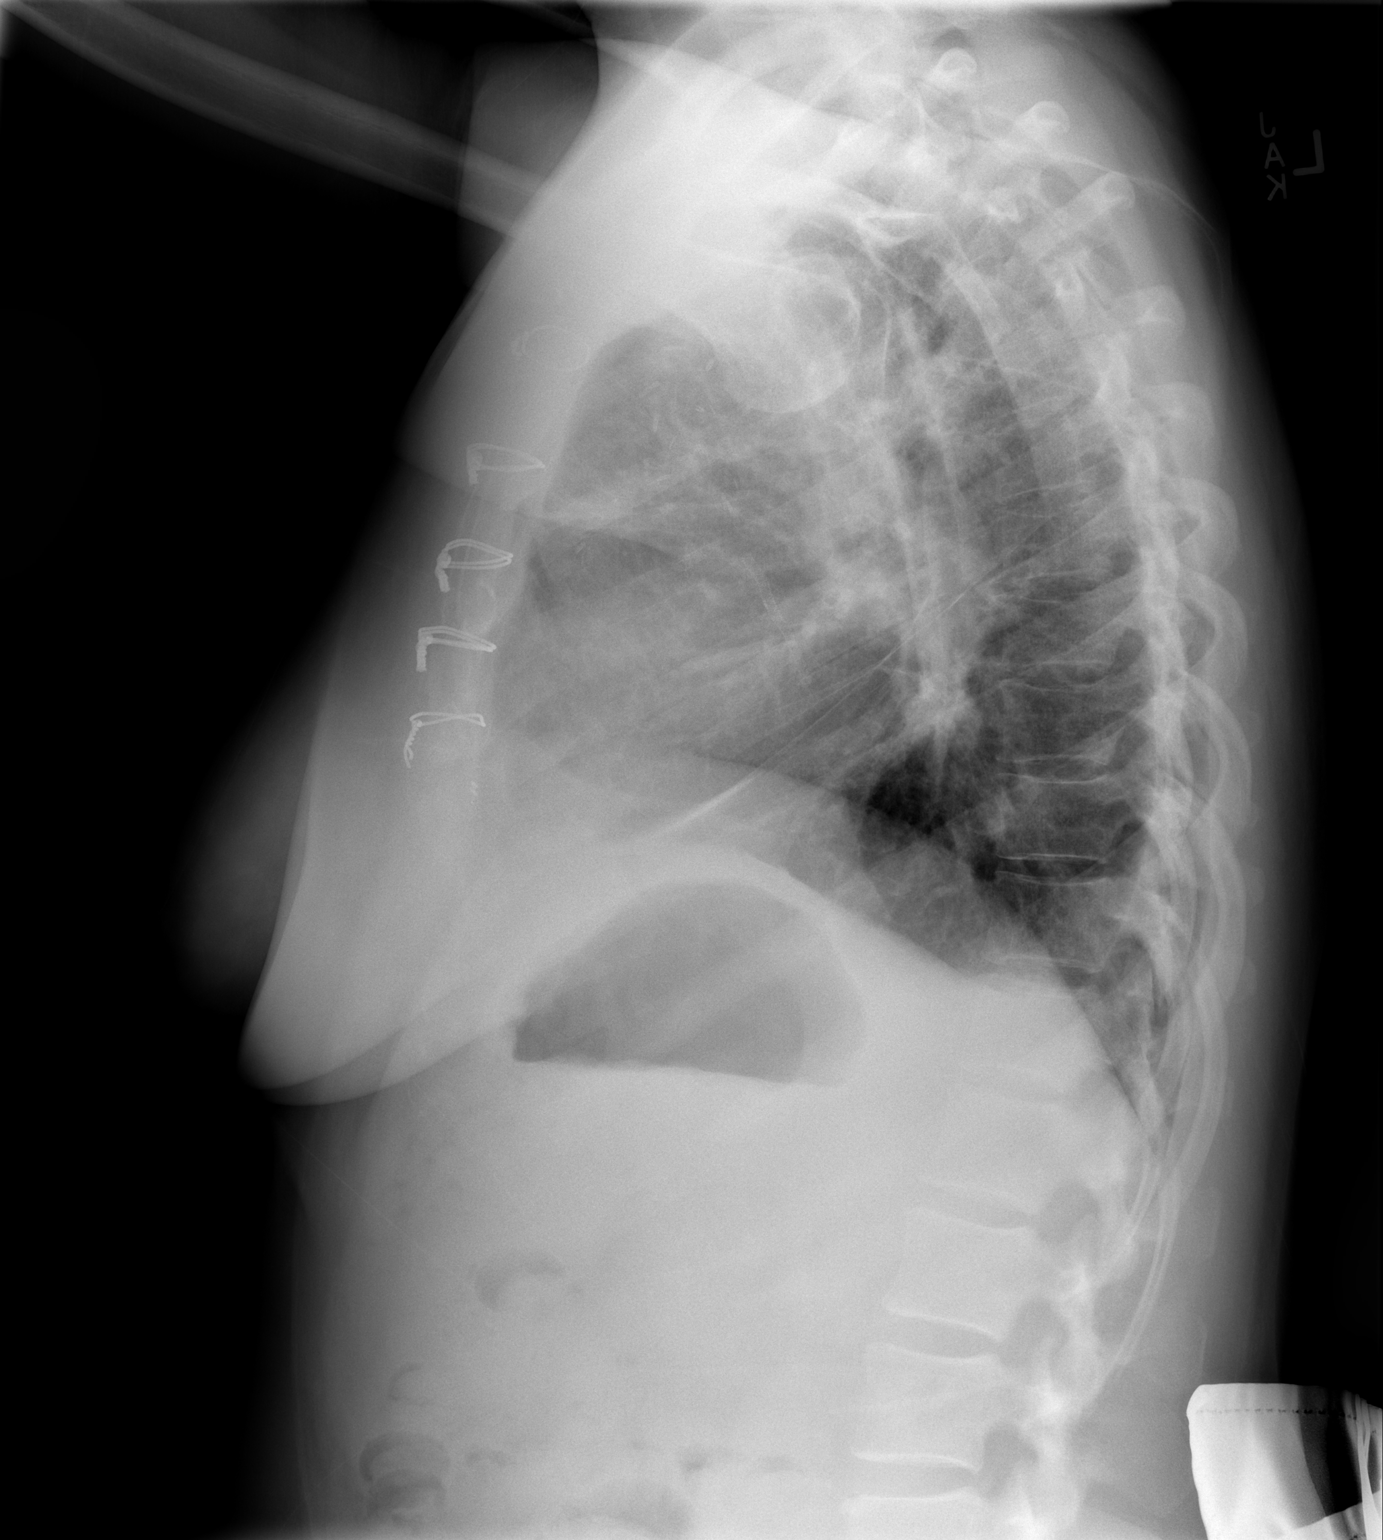

[2 of 2 positions shown; findings below may reference images not displayed]

FINDINGS: Cardiomediastinal silhouette unchanged in size and contour. Surgical
changes of median sternotomy and CABG.

Calcifications of coronary vasculature.

Blunting of the left costophrenic angle, with no blunting on the
lateral view within the costophrenic sulcus.

Trace thickening of the fissure on the lateral view.

Linear opacity the right mid lung.

No displaced fracture.
IMPRESSION: Postsurgical changes of median sternotomy and CABG, with blunting at
the left costophrenic angle favored to reflect scarring/atelectasis.

Atelectasis/ scarring in the right mid lung with no evidence of
lobar pneumonia.

## 2017-01-17 DIAGNOSIS — Z48812 Encounter for surgical aftercare following surgery on the circulatory system: Secondary | ICD-10-CM | POA: Diagnosis not present

## 2017-01-18 ENCOUNTER — Other Ambulatory Visit: Payer: Self-pay | Admitting: Physician Assistant

## 2017-01-19 ENCOUNTER — Other Ambulatory Visit (HOSPITAL_COMMUNITY): Payer: Self-pay | Admitting: Internal Medicine

## 2017-01-26 ENCOUNTER — Other Ambulatory Visit: Payer: Self-pay | Admitting: Physician Assistant

## 2017-02-02 ENCOUNTER — Ambulatory Visit (INDEPENDENT_AMBULATORY_CARE_PROVIDER_SITE_OTHER): Payer: Self-pay | Admitting: Thoracic Surgery (Cardiothoracic Vascular Surgery)

## 2017-02-02 ENCOUNTER — Encounter: Payer: Self-pay | Admitting: Thoracic Surgery (Cardiothoracic Vascular Surgery)

## 2017-02-02 VITALS — BP 105/67 | HR 65 | Resp 16 | Ht 61.0 in | Wt 157.0 lb

## 2017-02-02 DIAGNOSIS — T8131XD Disruption of external operation (surgical) wound, not elsewhere classified, subsequent encounter: Secondary | ICD-10-CM

## 2017-02-02 DIAGNOSIS — Z951 Presence of aortocoronary bypass graft: Secondary | ICD-10-CM

## 2017-02-02 NOTE — Progress Notes (Signed)
[address]       [place] [address]             [phone]     CARDIOTHORACIC SURGERY OFFICE NOTE  Referring Provider is [name], MD PCP is Pearson Grippe, MD   HPI:  Patient returns to our office today for wound check related to dehiscence of her left thigh vein harvest incision performed at the time of coronary artery bypass surgery last November and she was last seen here in our office on 12/22/2016 at which time she was making good progress overall. Since then she has done very well. Her left thigh wound continued to shrink and granulate in until the point where wound VAC placement was no longer possible. She now applies a moistened gauze to her left thigh wound daily. She is otherwise getting around fairly well. She states that she is not smoking cigarettes. She has had problems with a diffuse rash all over her body which is finally getting better. She has been referred to a dermatologist but not yet been evaluated. The rash is reportedly improving. She has not had any exertional chest pain or chest tightness to suggest a recurrence of angina pectoris. Appetite is good. She has not enrolled in the outpatient cardiac rehabilitation program because she spends most of her time caring for her fianc at home.   Current Outpatient Prescriptions  Medication Sig Dispense Refill  . acetaminophen (TYLENOL) 325 MG tablet Take 2 tablets (650 mg total) by mouth every 6 (six) hours as needed for mild pain or headache.    [name] aspirin EC 81 MG EC tablet Take 1 tablet (81 mg total) by mouth daily.    [name] atorvastatin (LIPITOR) 80 MG tablet Take 1 tablet (80 mg total) by mouth daily. 30 tablet 1  . buPROPion (WELLBUTRIN XL) 300 MG 24 hr tablet Take 300 mg by mouth every morning.  1  . clopidogrel (PLAVIX) 75 MG tablet TAKE 1 TABLET BY MOUTH DAILY WITH BREAKFAST 30 tablet 10  . escitalopram (LEXAPRO) 20 MG tablet Take 20 mg by mouth daily.  3  . fluticasone (FLONASE) 50 MCG/ACT  nasal spray Place 2 sprays into both nostrils daily as needed for allergies or rhinitis.    . furosemide (LASIX) 40 MG tablet Take 1 tablet (40 mg total) by mouth 2 (two) times daily. If dizzy or weight continues to decrease, please stop. (Patient taking differently: Take 40 mg by mouth daily. If dizzy or weight continues to decrease, please stop.) 60 tablet 3  . metoprolol tartrate (LOPRESSOR) 25 MG tablet Take 0.5 tablets (12.5 mg total) by mouth 2 (two) times daily. 30 tablet 1  . metoprolol tartrate (LOPRESSOR) 25 MG tablet TAKE 1/2 TABLET TWICE A DAY 30 tablet 1  . ondansetron (ZOFRAN-ODT) 4 MG disintegrating tablet Take 4 mg by mouth daily as needed for nausea or vomiting. DISSOLVE  3  . pantoprazole (PROTONIX) 40 MG tablet TAKE 1 TABLET BY MOUTH TWICE A DAY BEFORE MEALS (Patient taking differently: Take 40 mg by mouth two times a day before meals) 60 tablet 10  . potassium chloride (K-DUR) 10 MEQ tablet Take 2 tablets (20 mEq total) by mouth daily. 60 tablet 1  . spironolactone (ALDACTONE) 25 MG tablet TAKE 1 TABLET EVERY DAY 30 tablet 11   No current facility-administered medications for this visit.       Physical Exam:   BP 105/67   Pulse 65   Resp 16  Ht 5\' 1"  (1.549 m)   Wt 157 lb (71.2 kg)   SpO2 95% Comment: ON RA  BMI 29.66 kg/m   General:  Well-appearing  Chest:   Clear to auscultation  CV:   Regular rate and rhythm  Incisions:  Sternal incision is healed nicely and sternum is stable. Left thigh incision has almost completely healed with a very tiny residual area that has not completely epithelialized. The remainder appears completely healed and scarred in.  Abdomen:  Soft nontender  Extremities:  Warm and well-perfused  Diagnostic Tests:  n/a   Impression:  The patient's left thigh wound has essentially healed completely. She otherwise seems to be doing fairly well.  Plan:  We have not recommended any changes to the patient's current medications. I have  encouraged the patient to enroll and participate in outpatient cardiac rehabilitation program. The patient will continue to follow-up with Dr. Gala RomneyBensimhon and Dr. Excell Seltzerooper. We will plan to see her next November, approximately 1 year following her original surgery. She will otherwise call and return to see us only should specific problems or questions arise.    Salvatore Decentlarence H. Cornelius Moraswen, MD 02/02/2017 3:28 PM

## 2017-02-02 NOTE — Addendum Note (Signed)
Addended by: Rowland LatheWRIGHT, Beatric Fulop R on: 02/02/2017 04:06 PM   Modules accepted: Orders

## 2017-02-02 NOTE — Progress Notes (Signed)
Referral to Cardiac Rehab sent to [facility] 

## 2017-02-02 NOTE — Patient Instructions (Signed)
You may discontinue local wound care for your left thigh wound.  Just keep it clean and dry and wash using soap and water  Continue all previous medications without any changes at this time  You may resume unrestricted physical activity without any particular limitations at this time.  You are encouraged to enroll and participate in the outpatient cardiac rehab program beginning as soon as practical.  Continue to avoid smoking at all costs  Make every effort to stay physically active, get some type of exercise on a regular basis, and stick to a "heart healthy diet".  The long term benefits for regular exercise and a healthy diet are critically important to your overall health and wellbeing.

## 2017-02-17 ENCOUNTER — Telehealth (HOSPITAL_COMMUNITY): Payer: Self-pay | Admitting: *Deleted

## 2017-02-17 NOTE — Telephone Encounter (Signed)
Received medicaid order for pt to participate in cardiac rehab.  Called and spoke to pt.  Pt requested that rehab staff call back this afternoon.  Pt unable to talk to staff.  Will call pt this afternoon. Alanson Alyarlette Carlton RN, BSN

## 2017-02-17 NOTE — Telephone Encounter (Signed)
Attempted to recall pt as previously requested. No answer from patient.  Unable to leave message.  Will try again later. Alanson Alyarlette Milia Warth RN, BSN

## 2017-02-24 ENCOUNTER — Encounter (HOSPITAL_COMMUNITY): Payer: Medicaid Other | Admitting: Internal Medicine

## 2017-02-24 ENCOUNTER — Other Ambulatory Visit (HOSPITAL_COMMUNITY): Payer: Self-pay | Admitting: *Deleted

## 2017-02-24 ENCOUNTER — Ambulatory Visit (HOSPITAL_COMMUNITY): Payer: Medicaid Other

## 2017-02-24 DIAGNOSIS — I5022 Chronic systolic (congestive) heart failure: Secondary | ICD-10-CM

## 2017-02-24 IMAGING — US US ABDOMEN COMPLETE
1 series · 14 of 25 positions shown · non-contrast
Comparison: None.

CLINICAL DATA: Chest pain and nausea.

EXAM:
ULTRASOUND ABDOMEN COMPLETE

[Series 1: us abdomen complete · 0.21mm/px · 14 of 71 slices shown]
[im 1/71]
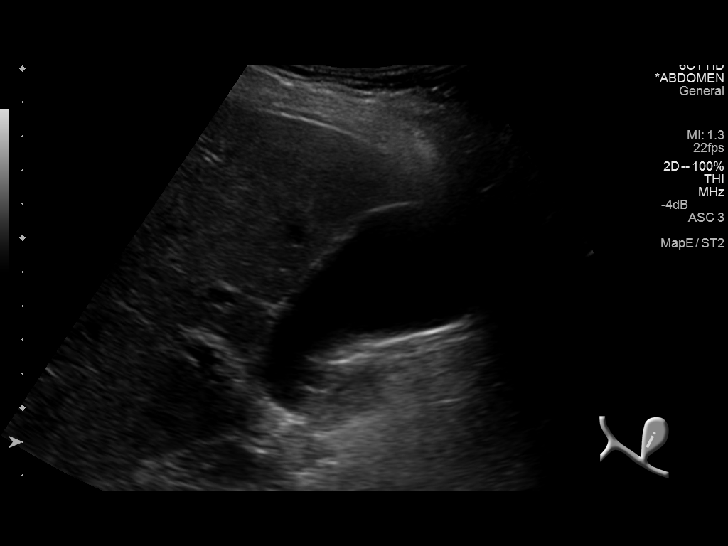
[im 6/71]
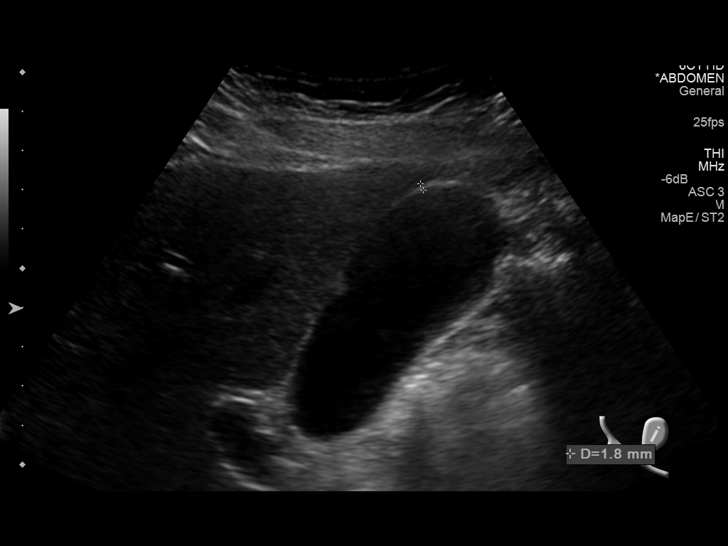
[im 12/71]
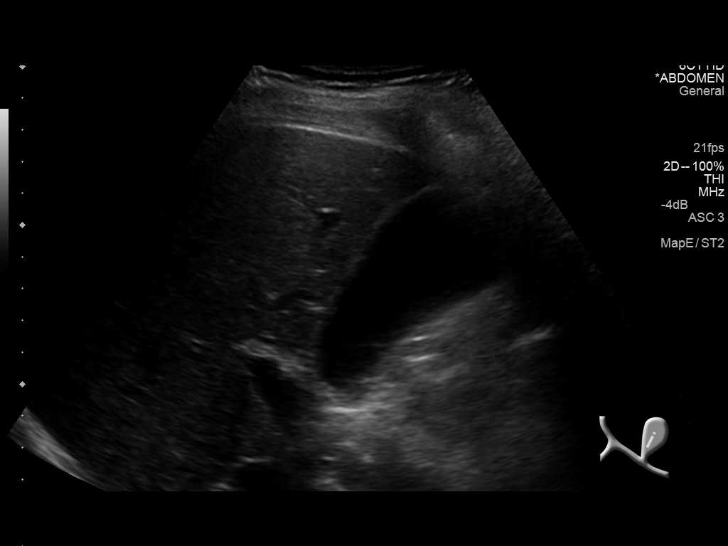
[im 18/71]
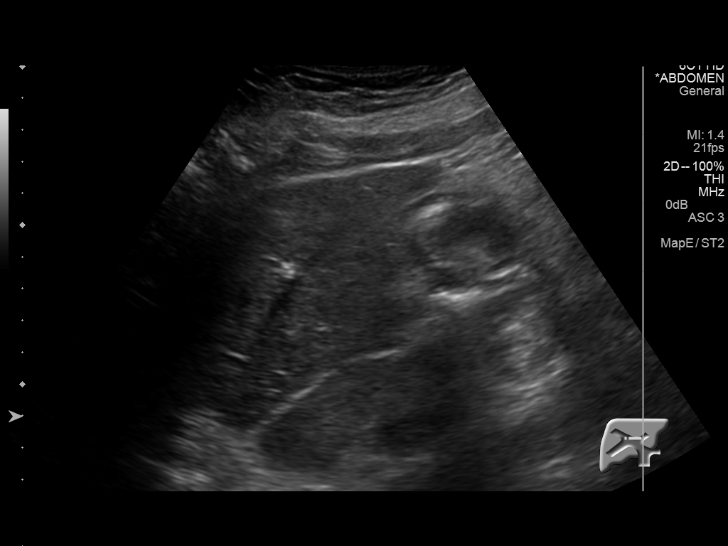
[im 24/71]
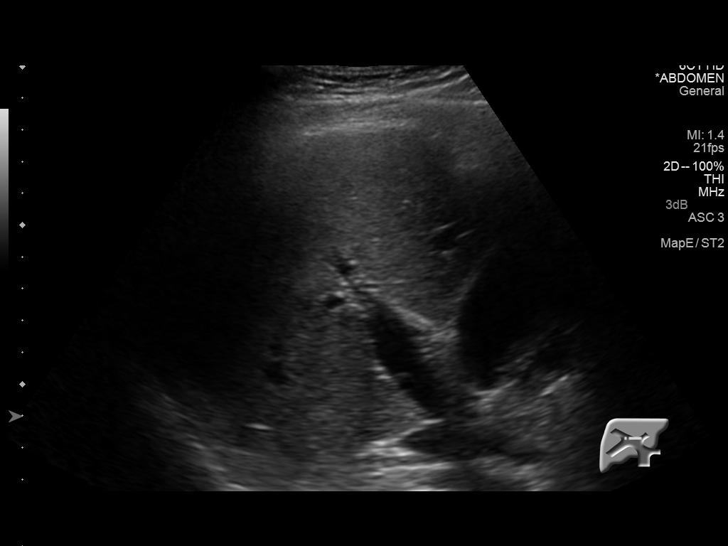
[im 27/71]
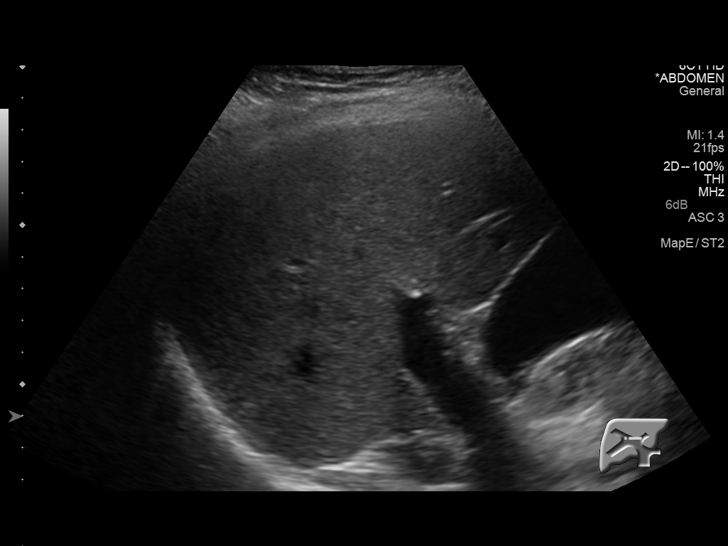
[im 33/71]
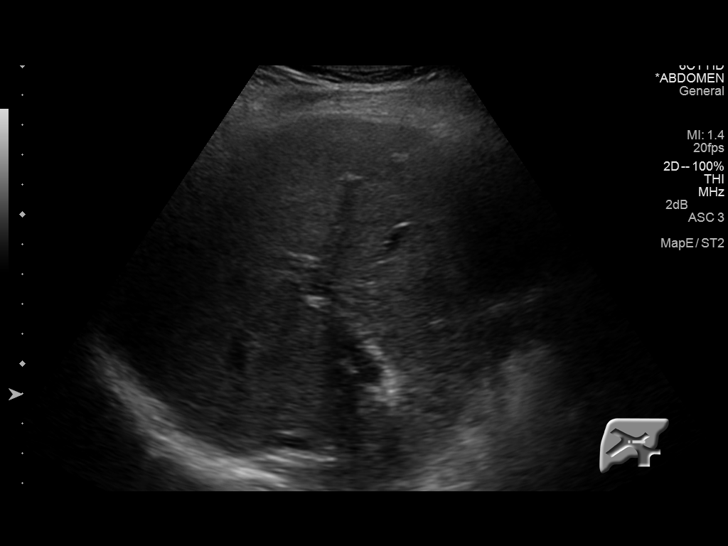
[im 38/71]
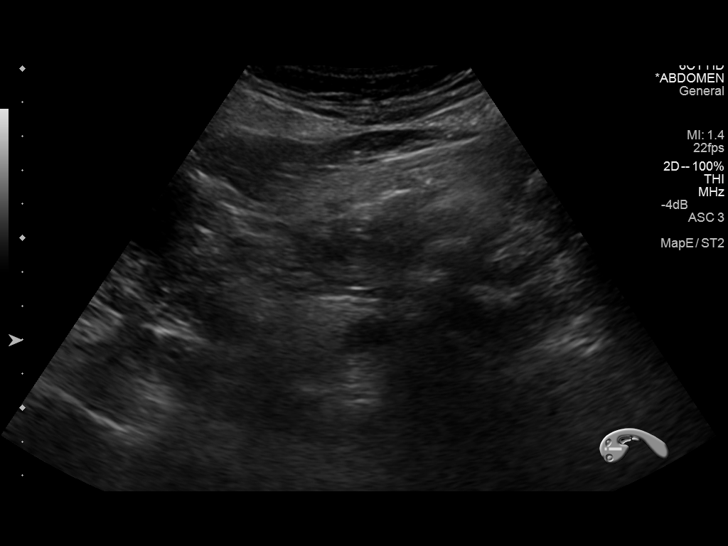
[im 44/71]
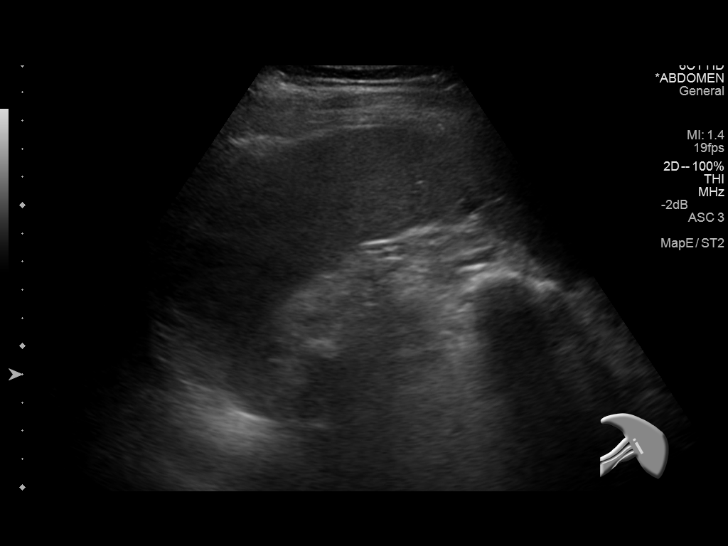
[im 47/71]
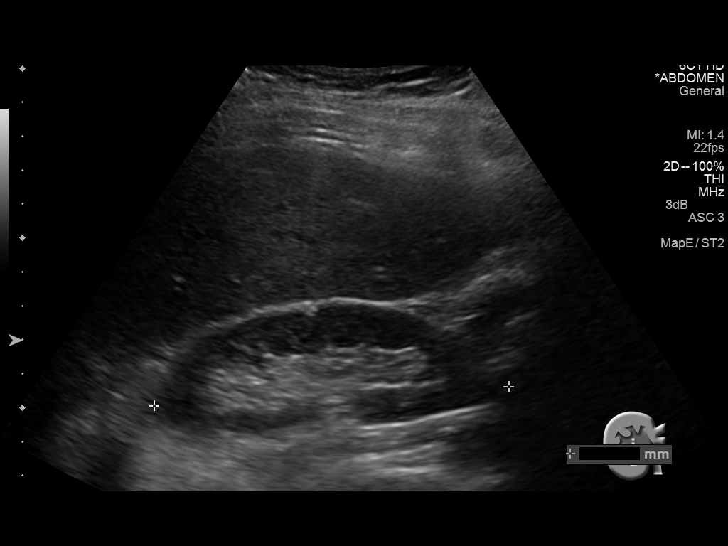
[im 53/71]
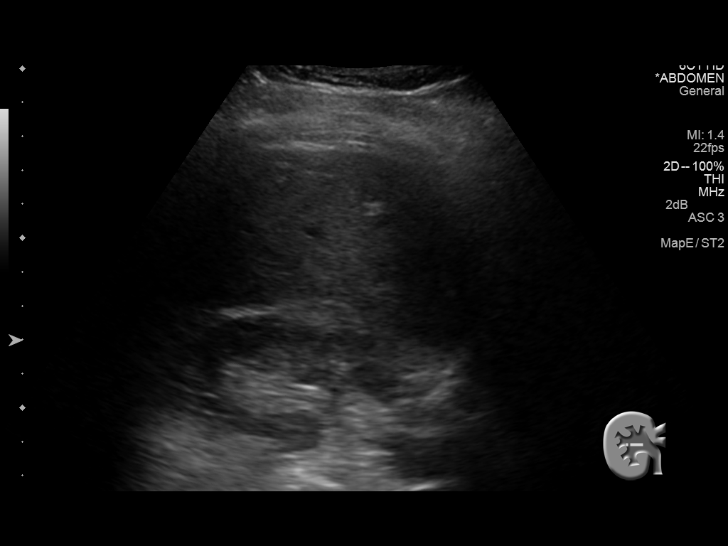
[im 59/71]
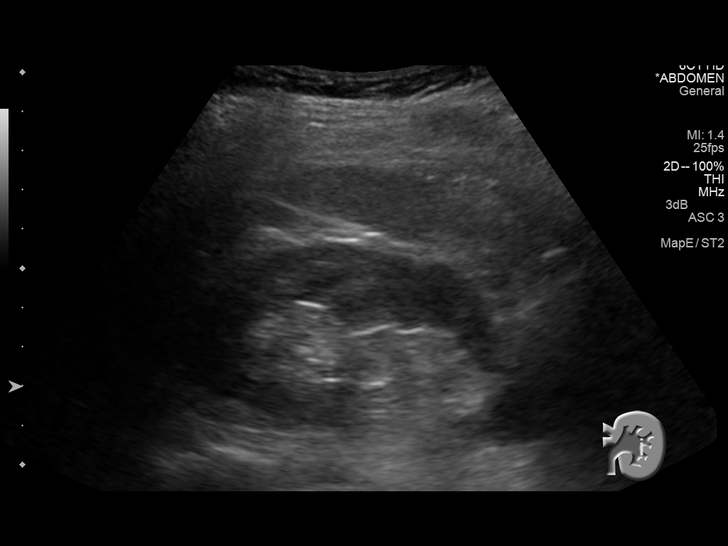
[im 65/71]
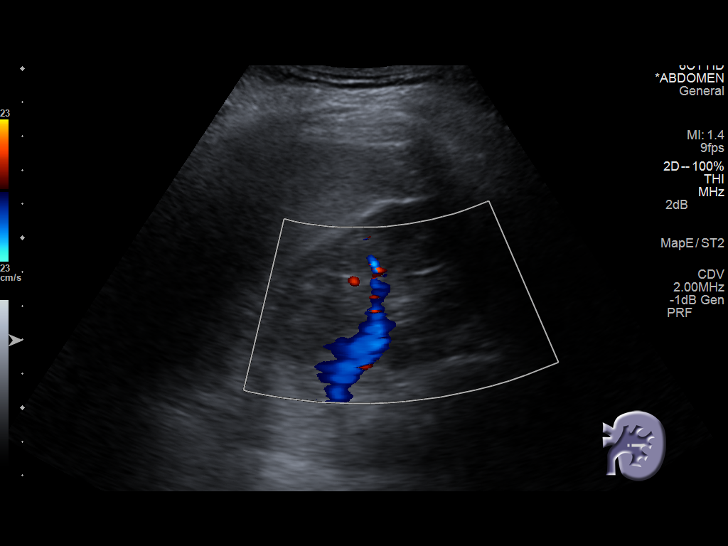
[im 71/71]
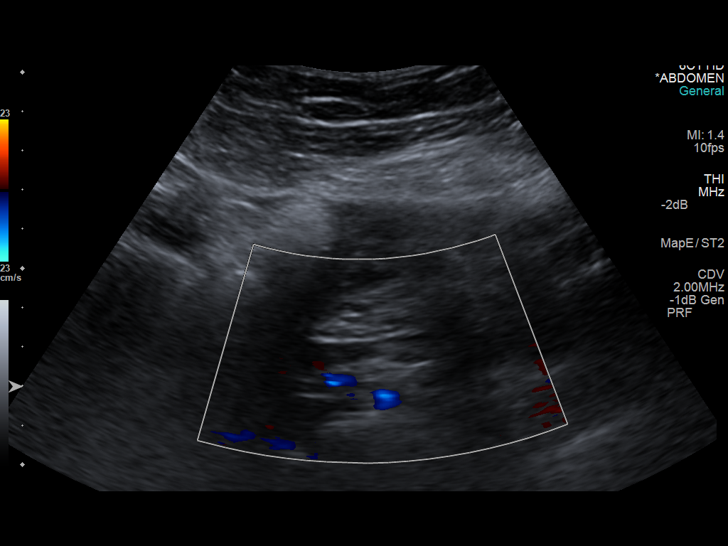

[14 of 25 positions shown; findings below may reference images not displayed]

FINDINGS: Gallbladder: No gallstones or wall thickening visualized. No
sonographic Murphy sign noted.

Common bile duct: Diameter: 3.5 mm.

Liver: No focal lesion identified. Within normal limits in
parenchymal echogenicity.

IVC: No abnormality visualized.

Pancreas: Visualized portion unremarkable.

Spleen: Size and appearance within normal limits.

Right Kidney: Length: 10.5 cm. Echogenicity within normal limits. No
mass or hydronephrosis visualized.

Left Kidney: Length: 10.1 cm. Echogenicity within normal limits. No
mass or hydronephrosis visualized.

Abdominal aorta: No aneurysm visualized.

Other findings: None.
IMPRESSION: No acute findings.

## 2017-02-25 ENCOUNTER — Telehealth (HOSPITAL_COMMUNITY): Payer: Self-pay | Admitting: Internal Medicine

## 2017-02-26 ENCOUNTER — Telehealth (HOSPITAL_COMMUNITY): Payer: Self-pay | Admitting: Internal Medicine

## 2017-02-26 ENCOUNTER — Encounter (HOSPITAL_COMMUNITY): Payer: Self-pay | Admitting: Internal Medicine

## 2017-02-26 NOTE — Telephone Encounter (Signed)
Verified Medicaid insurance benefits through [employer]. Reference [phone]#[id number]... KJ Received Medicaid form from Dr. Tonny BollmanMichael Cooper....  Also mailed letter after leaving msg for pt.... KJ

## 2017-03-17 ENCOUNTER — Other Ambulatory Visit (HOSPITAL_COMMUNITY): Payer: Self-pay | Admitting: Internal Medicine

## 2017-04-03 ENCOUNTER — Telehealth (HOSPITAL_COMMUNITY): Payer: Self-pay | Admitting: Internal Medicine

## 2017-04-03 NOTE — Telephone Encounter (Signed)
Called pt no VM just continue to ring, cancelled out referral due to no response... KJ

## 2017-04-08 ENCOUNTER — Inpatient Hospital Stay (HOSPITAL_COMMUNITY): Admission: RE | Admit: 2017-04-08 | Payer: Medicaid Other | Source: Ambulatory Visit | Admitting: Internal Medicine

## 2017-04-08 ENCOUNTER — Ambulatory Visit (HOSPITAL_COMMUNITY): Payer: Medicaid Other

## 2017-04-16 ENCOUNTER — Other Ambulatory Visit: Payer: Self-pay | Admitting: Cardiovascular Disease

## 2017-04-16 ENCOUNTER — Other Ambulatory Visit: Payer: Self-pay | Admitting: Thoracic Surgery (Cardiothoracic Vascular Surgery)

## 2017-05-23 ENCOUNTER — Other Ambulatory Visit: Payer: Self-pay | Admitting: Cardiology

## 2017-05-25 ENCOUNTER — Other Ambulatory Visit: Payer: Self-pay | Admitting: *Deleted

## 2017-05-25 MED ORDER — ATORVASTATIN CALCIUM 80 MG PO TABS: 80.0000 mg | ORAL_TABLET | Freq: Every day | ORAL | 0 refills | Status: DC

## 2017-05-25 NOTE — Telephone Encounter (Signed)
This was increased during the pt's hospitalization in December 2017. Okay to refill.

## 2017-05-25 NOTE — Telephone Encounter (Signed)
Medication Detail    Disp Refills Start End   atorvastatin (LIPITOR) 80 MG tablet 30 tablet 0 05/25/2017    Sig - Route: Take 1 tablet (80 mg total) by mouth daily. *Please call and schedule a one year follow up appointment for further refills 1st attempt* - Oral   E-Prescribing Status: Receipt confirmed by pharmacy (05/25/2017 4:49 PM EDT)   Pharmacy   [id number] - [place], [place] - [address]

## 2017-05-25 NOTE — Telephone Encounter (Signed)
Looks like dose was changed to 80 mg qd by Doree Fudgeonielle Zimmerman. Okay to refill? Please advise. Thanks, MI

## 2017-05-27 ENCOUNTER — Inpatient Hospital Stay (HOSPITAL_COMMUNITY): Admission: RE | Admit: 2017-05-27 | Payer: Medicaid Other | Source: Ambulatory Visit | Admitting: Internal Medicine

## 2017-05-27 ENCOUNTER — Ambulatory Visit (HOSPITAL_COMMUNITY): Admission: RE | Admit: 2017-05-27 | Payer: Medicaid Other | Source: Ambulatory Visit

## 2017-06-19 ENCOUNTER — Other Ambulatory Visit: Payer: Self-pay | Admitting: Cardiovascular Disease

## 2017-07-14 ENCOUNTER — Telehealth: Payer: Self-pay | Admitting: Cardiovascular Disease

## 2017-07-14 NOTE — Telephone Encounter (Signed)
New message    Pt is calling. Please call.   Pt c/o medication issue:  1. Name of Medication: Beta blocker and lasix.  2. How are you currently taking this medication (dosage and times per day)?   3. Are you having a reaction (difficulty breathing--STAT)? Pt states she got staph from this medication. She said she had another condition previously that turned into staph. She said she has oozing holes and itches all over her body.   4. What is your medication issue? She said her dermatologist gave her antibiotic, but it has not helped. She wants to know what to do.

## 2017-07-15 ENCOUNTER — Telehealth: Payer: Self-pay | Admitting: Cardiovascular Disease

## 2017-07-15 NOTE — Telephone Encounter (Signed)
Patient advised these medications are not known to cause staph infection and to follow up with derm/PCP who ordered the cultures to follow up. Advised to let them know wounds are getting worse and new wounds appearing appearing since starting antibiotic. Further advised to go to ED afterhours if she feels symptoms are worsening and she needs more immediate treatment.  Ave FilterBradley, Megan Genevea, RN

## 2017-07-15 NOTE — Telephone Encounter (Signed)
Mrs. Christina Morrison is calling because her Daughter Christina Morrison has develop staff and wants to speak with Dr.Cooper . States that the dermatologist says it maybe coming from her beta blocker . Please call

## 2017-07-15 NOTE — Telephone Encounter (Signed)
Development of a staph infection is not associated with her beta blocker or lasix. She likely needs to return to her dermatologist for further recommendations.   Tyler DeisErika K. Bonnye FavaNicolsen, PharmD, BCPS, CPP Clinical Pharmacist Pager: [phone] Phone: [phone] 07/15/2017 9:49 AM

## 2017-07-23 ENCOUNTER — Other Ambulatory Visit: Payer: Self-pay | Admitting: Cardiovascular Disease

## 2017-08-11 ENCOUNTER — Ambulatory Visit (HOSPITAL_BASED_OUTPATIENT_CLINIC_OR_DEPARTMENT_OTHER)
Admission: RE | Admit: 2017-08-11 | Discharge: 2017-08-11 | Disposition: A | Discharge status: Home / Self Care | Payer: Medicaid Other | Source: Ambulatory Visit | Attending: Internal Medicine | Admitting: Internal Medicine

## 2017-08-11 ENCOUNTER — Encounter (HOSPITAL_COMMUNITY): Payer: Self-pay | Admitting: Internal Medicine

## 2017-08-11 ENCOUNTER — Ambulatory Visit (HOSPITAL_COMMUNITY)
Admission: RE | Admit: 2017-08-11 | Discharge: 2017-08-11 | Disposition: A | Discharge status: Home / Self Care | Payer: Medicaid Other | Source: Ambulatory Visit | Attending: Internal Medicine | Admitting: Internal Medicine

## 2017-08-11 VITALS — BP 111/80 | HR 63 | Wt 150.8 lb

## 2017-08-11 DIAGNOSIS — I509 Heart failure, unspecified: Secondary | ICD-10-CM | POA: Diagnosis not present

## 2017-08-11 DIAGNOSIS — I5022 Chronic systolic (congestive) heart failure: Secondary | ICD-10-CM | POA: Diagnosis not present

## 2017-08-11 DIAGNOSIS — I5042 Chronic combined systolic (congestive) and diastolic (congestive) heart failure: Secondary | ICD-10-CM

## 2017-08-11 DIAGNOSIS — I071 Rheumatic tricuspid insufficiency: Secondary | ICD-10-CM | POA: Insufficient documentation

## 2017-08-11 DIAGNOSIS — I2583 Coronary atherosclerosis due to lipid rich plaque: Secondary | ICD-10-CM

## 2017-08-11 DIAGNOSIS — I2510 Atherosclerotic heart disease of native coronary artery without angina pectoris: Secondary | ICD-10-CM

## 2017-08-11 LAB — ECHOCARDIOGRAM COMPLETE
AVLVOTPG: 4 mmHg
CHL CUP DOP CALC LVOT VTI: 20.3 cm
CHL CUP MV DEC (S): 229
CHL CUP RV SYS PRESS: 26 mmHg
CHL CUP TV REG PEAK VELOCITY: 239 cm/s
E/e' ratio: 9.71
EWDT: 229 ms
FS: 26 % — AB (ref 28–44)
IV/PV OW: 0.97
LA vol: 46.9 mL
LADIAMINDEX: 2.41 cm/m2
LASIZE: 41 mm
LAVOLA4C: 42.1 mL
LAVOLIN: 27.6 mL/m2
LDCA: 2.27 cm2
LEFT ATRIUM END SYS DIAM: 41 mm
LV E/e' medial: 9.71
LV E/e'average: 9.71
LV PW d: 10 mm — AB (ref 0.6–1.1)
LVELAT: 10.5 cm/s
LVOT SV: 46 mL
LVOT peak vel: 101 cm/s
LVOTD: 17 mm
Lateral S' vel: 4.95 cm/s
MV Peak grad: 4 mmHg
MV pk E vel: 102 m/s
MVPKAVEL: 43.7 m/s
TAPSE: 13 mm
TDI e' lateral: 10.5
TDI e' medial: 3.57
TRMAXVEL: 239 cm/s

## 2017-08-11 NOTE — Patient Instructions (Addendum)
STOP spironolactone.  Your provider requests you have a Cardiac MRI before follow up.   Follow up in 3 months with Dr.Bensimhon

## 2017-08-11 NOTE — Progress Notes (Signed)
  Echocardiogram 2D Echocardiogram has been performed.  Christina Morrison T Christina Morrison 08/11/2017, 3:00 PM

## 2017-08-11 NOTE — Progress Notes (Signed)
Advanced Heart Failure Clinic Note   Primary Care: Dr. Pearson Grippe  Primary Cardiologist: Dr Excell Seltzer Primary HF: Dr. Gala Romney  CT Surgeon: Dr. Cornelius Moras Dermatology: Dr Adolphus Birchwood   HPI:  Christina Morrison is a 41 y.o. female with HTN, CAD and tobacco use. Pt underwent stenting of LCX in 1/17. Readmitted for NSTEMI. Found to have 2v CAD with high-grade ostial LAD and LCX stensosis.   Pt underwent CABG 11/03/16 with Dr. Cornelius Moras with LIMA to LAD, SVG to DIAG and SVG to LCX. Developed VF arrest and had re-do sternotomy begun at bedside with cardiac massage. 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 Underwent re-do CABG x1 with SVG to OM and placement of biVAD support due to shock.  Hospital course additionally complicated by large hematoma with skin necrosis of LLE from open EVH harvest. Treated with ABX and wound care in hospital.   Today she returns for 6 month follow up. Overall feeling ok. Biggest complaint has been staph infection diagnosed 3 weeks ago by Dermatology. There was concern the skin loss has been from medications. No fever of chills. Weight at home up to 164 but now back to 150 after taking lasix. Denies SOB/PND/Orthopnea. No CP. Smoking 1/2 PPD.    Past Medical History:  Diagnosis Date  . Anxiety   . Bipolar 1 disorder (HCC)   . Cardiac arrest (HCC) 11/03/2016  . Cardiogenic shock (HCC) 11/03/2016  . Chronic pain   . Cocaine use   . Coronary artery disease    a. 03/2015 NSTEMI/PCI in setting of cocaine use - BMS to diagonal. b. NSTEMI 12/2015 s/o overlapping DES to Cx, residual mild RCA and LAD disease, 75% OM1.  . Depression   . Dyslipidemia   . Migraine   . Osteoarthritis   . Polysubstance abuse    a. tobacco/cocaine  . 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 Scoliosis   . TIA (transient ischemic attack)     Current Outpatient Prescriptions  Medication Sig Dispense Refill  . acetaminophen (TYLENOL) 325 MG tablet Take 2 tablets  (650 mg total) by mouth every 6 (six) hours as needed for mild pain or headache.    [name] aspirin EC 81 MG EC tablet Take 1 tablet (81 mg total) by mouth daily.    [name] atorvastatin (LIPITOR) 80 MG tablet TAKE 1 TABLET BY MOUTH EVERY DAY 30 tablet 0  . buPROPion (WELLBUTRIN XL) 300 MG 24 hr tablet Take 300 mg by mouth every morning.  1  . clopidogrel (PLAVIX) 75 MG tablet TAKE 1 TABLET BY MOUTH DAILY WITH BREAKFAST 30 tablet 10  . escitalopram (LEXAPRO) 20 MG tablet Take 20 mg by mouth daily.  3  . furosemide (LASIX) 40 MG tablet Take 40 mg by mouth daily as needed.    . metoprolol tartrate (LOPRESSOR) 25 MG tablet Take 0.5 tablets (12.5 mg total) by mouth 2 (two) times daily. 30 tablet 1  . ondansetron (ZOFRAN-ODT) 4 MG disintegrating tablet Take 4 mg by mouth daily as needed for nausea or vomiting. DISSOLVE  3  . pantoprazole (PROTONIX) 40 MG tablet TAKE 1 TABLET BY MOUTH TWICE A DAY BEFORE MEALS (Patient taking differently: Take 40 mg by mouth two times a day before meals) 60 tablet 10  . spironolactone (ALDACTONE) 25 MG tablet TAKE 1 TABLET EVERY DAY 30 tablet 11   No current facility-administered medications for this encounter.  Allergies  Allergen Reactions  . Ranexa [Ranolazine] Nausea Only    Dizziness (only the 1,000 mg dose has this effect)      Social History   Social History  . Marital status: Divorced    Spouse name: N/A  . Number of children: N/A  . Years of education: N/A   Occupational History  . Not on file.   Social History Main Topics  . Smoking status: Current Some Day Smoker    Packs/day: 0.10    Types: Cigarettes  . Smokeless tobacco: Never Used  . Alcohol use No  . Drug use: No     Comment: Prior cocaine use  . Sexual activity: Not on file   Other Topics Concern  . Not on file   Social History Narrative  . No narrative on file      Family History  Problem Relation Age of Onset  . Mental retardation Mother   . Hypertension Mother   .  Hyperlipidemia Mother   . Heart disease Mother   . Depression Mother   . Hypertension Father   . Hyperlipidemia Father   . Heart disease Father   . Mental retardation Father   . Diabetes Father   . Cancer Maternal Aunt   . Stroke Maternal Grandmother     There were no vitals filed for this visit. Wt Readings from Last 3 Encounters:  02/02/17 157 lb (71.2 kg)  01/07/17 157 lb (71.2 kg)  12/22/16 170 lb 6.4 oz (77.3 kg)    PHYSICAL EXAM: General:  NAD. No resp difficulty HEENT: normal Neck: supple. no JVD. Carotids 2+ bilat; no bruits. No lymphadenopathy or thryomegaly appreciated. Cor: PMI nondisplaced. Regular rate & rhythm. No rubs, gallops or murmurs. Lungs: clear Abdomen: soft, nontender, nondistended. No hepatosplenomegaly. No bruits or masses. Good bowel sounds. Extremities: no cyanosis, clubbing, rash, edema. Multiple skin excoriations Neuro: alert & orientedx3, cranial nerves grossly intact. moves all 4 extremities w/o difficulty. Affect pleasant    ASSESSMENT & PLAN:  1. CAD s/p CABG with emergent re-do requiring BIVAD support.  - No s/s ischemia. Stable. Continue ASA, plavix, statin. - Continue Lopressor 12.5 mg BID.  2. Combined Systolic/Diastolic Biventricular HF -  - Echo 11/13/2016  EF 50-55% RV  Todays ECHO was discussed and reviewed by Dr Gala Romney . LVEF 50% RV mildly hypokinetic.  Set up for cMRI to evaluate apex.  -Stop spiro with normal ECHO and concern for skin lesions related to medications.  -3. Tobacco use -Discussed smoking cessation. 4. Multiple Partial Thickness wounds- Discussed with Pharm D    BMET today.  Follow up in 3 months   [name], NP   Patient seen and examined with [name], NP. We discussed all aspects of the encounter. I agree with the assessment and plan as stated above.   Doing well from cardiac standpoint. Echo reviewed personally EF ~50% with question apical abnormality (looks like clot but unusual with good wall  motion). Will get cMRI. CAD stable with no s/s ischemia. Continue DAPT, statin and b-blocker. Multiple skin lesions - ? Drug abuse or excoriations. She says it was related to spiro. Will gold for now and see what happens.   Arvilla Meres, MD  11:49 PM

## 2017-08-12 ENCOUNTER — Telehealth (HOSPITAL_COMMUNITY): Payer: Self-pay | Admitting: Vascular Surgery

## 2017-08-12 NOTE — Telephone Encounter (Signed)
Left pt message w/ sgo to call office to make lab appt

## 2017-08-15 ENCOUNTER — Other Ambulatory Visit: Payer: Self-pay | Admitting: Internal Medicine

## 2017-08-15 ENCOUNTER — Other Ambulatory Visit: Payer: Self-pay | Admitting: Cardiovascular Disease

## 2017-08-15 DIAGNOSIS — I2583 Coronary atherosclerosis due to lipid rich plaque: Principal | ICD-10-CM

## 2017-08-15 DIAGNOSIS — E785 Hyperlipidemia, unspecified: Secondary | ICD-10-CM

## 2017-08-15 DIAGNOSIS — I2510 Atherosclerotic heart disease of native coronary artery without angina pectoris: Secondary | ICD-10-CM

## 2017-08-18 ENCOUNTER — Telehealth (HOSPITAL_COMMUNITY): Payer: Self-pay | Admitting: Vascular Surgery

## 2017-08-18 NOTE — Telephone Encounter (Signed)
Left pt message to call back to make lab appt

## 2017-08-24 ENCOUNTER — Encounter (HOSPITAL_COMMUNITY): Payer: Self-pay | Admitting: Vascular Surgery

## 2017-08-24 ENCOUNTER — Telehealth (HOSPITAL_COMMUNITY): Payer: Self-pay | Admitting: Vascular Surgery

## 2017-08-24 NOTE — Telephone Encounter (Signed)
Called pt several time to schedule lab work pt has not called back will send letter

## 2017-08-25 ENCOUNTER — Encounter: Payer: Self-pay | Admitting: Internal Medicine

## 2017-08-25 ENCOUNTER — Telehealth: Payer: Self-pay | Admitting: Internal Medicine

## 2017-08-25 NOTE — Telephone Encounter (Signed)
Called patient, but, could not leave message regarding cardiac MRI.  Letter mailed to the patient today and message to the nurse.

## 2017-09-02 ENCOUNTER — Encounter (HOSPITAL_COMMUNITY): Payer: Self-pay

## 2017-09-02 ENCOUNTER — Ambulatory Visit (HOSPITAL_COMMUNITY)
Admission: RE | Admit: 2017-09-02 | Discharge: 2017-09-02 | Disposition: A | Discharge status: Home / Self Care | Payer: Medicaid Other | Source: Ambulatory Visit | Attending: Internal Medicine | Admitting: Internal Medicine

## 2017-09-02 DIAGNOSIS — I255 Ischemic cardiomyopathy: Secondary | ICD-10-CM | POA: Diagnosis not present

## 2017-09-02 DIAGNOSIS — I2510 Atherosclerotic heart disease of native coronary artery without angina pectoris: Secondary | ICD-10-CM | POA: Insufficient documentation

## 2017-09-02 DIAGNOSIS — I509 Heart failure, unspecified: Secondary | ICD-10-CM | POA: Diagnosis present

## 2017-09-02 LAB — CREATININE, SERUM: Creatinine, Ser: 0.79 mg/dL (ref 0.44–1.00)

## 2017-09-02 MED ORDER — GADOBENATE DIMEGLUMINE 529 MG/ML IV SOLN
22.0000 mL | Freq: Once | INTRAVENOUS | Status: AC | PRN
  Administered 2017-09-02: 22 mL via INTRAVENOUS

## 2017-09-14 ENCOUNTER — Telehealth (HOSPITAL_COMMUNITY): Payer: Self-pay | Admitting: *Deleted

## 2017-09-14 NOTE — Telephone Encounter (Signed)
MR Card Morphology Wo/W Cm  Order: [id number]  Status:  Final result Visible to patient:  Yes (MyChart) Dx:  Heart failure, unspecified HF chronic...  Notes recorded by Georgina Peer, RN on 09/14/2017 at 11:37 AM EDT Patient called asking for results. Results provided but patient has some questions/concerns. I will talk with Dr. Gala Romney in clinic regarding patient's concerns and will call her back. ------  Notes recorded by Dolores Patty, MD on 09/13/2017 at 11:56 AM EDT No LV clot. EF 57%

## 2017-09-19 ENCOUNTER — Other Ambulatory Visit: Payer: Self-pay | Admitting: Cardiovascular Disease

## 2017-09-19 DIAGNOSIS — I2510 Atherosclerotic heart disease of native coronary artery without angina pectoris: Secondary | ICD-10-CM

## 2017-09-19 DIAGNOSIS — I2583 Coronary atherosclerosis due to lipid rich plaque: Principal | ICD-10-CM

## 2017-09-19 DIAGNOSIS — E785 Hyperlipidemia, unspecified: Secondary | ICD-10-CM

## 2017-10-06 ENCOUNTER — Other Ambulatory Visit: Payer: Self-pay | Admitting: Cardiovascular Disease

## 2017-10-06 DIAGNOSIS — I2583 Coronary atherosclerosis due to lipid rich plaque: Principal | ICD-10-CM

## 2017-10-06 DIAGNOSIS — E785 Hyperlipidemia, unspecified: Secondary | ICD-10-CM

## 2017-10-06 DIAGNOSIS — I2510 Atherosclerotic heart disease of native coronary artery without angina pectoris: Secondary | ICD-10-CM

## 2017-10-13 ENCOUNTER — Other Ambulatory Visit: Payer: Self-pay | Admitting: Cardiovascular Disease

## 2017-10-13 DIAGNOSIS — I2583 Coronary atherosclerosis due to lipid rich plaque: Principal | ICD-10-CM

## 2017-10-13 DIAGNOSIS — E785 Hyperlipidemia, unspecified: Secondary | ICD-10-CM

## 2017-10-13 DIAGNOSIS — I2510 Atherosclerotic heart disease of native coronary artery without angina pectoris: Secondary | ICD-10-CM

## 2017-11-09 ENCOUNTER — Ambulatory Visit: Payer: Medicaid Other | Admitting: Thoracic Surgery (Cardiothoracic Vascular Surgery)

## 2017-11-10 ENCOUNTER — Encounter (HOSPITAL_COMMUNITY): Payer: Self-pay | Admitting: *Deleted

## 2017-11-10 NOTE — Progress Notes (Signed)
[facility]  Requested records faxed today to [phone].  Original request will be scanned to patient's electronic medical record.

## 2017-11-17 ENCOUNTER — Other Ambulatory Visit: Payer: Self-pay | Admitting: Cardiology

## 2017-11-17 ENCOUNTER — Other Ambulatory Visit (HOSPITAL_COMMUNITY): Payer: Self-pay | Admitting: Internal Medicine

## 2017-11-17 ENCOUNTER — Other Ambulatory Visit: Payer: Self-pay | Admitting: Cardiovascular Disease

## 2017-11-17 DIAGNOSIS — I2510 Atherosclerotic heart disease of native coronary artery without angina pectoris: Secondary | ICD-10-CM

## 2017-11-17 DIAGNOSIS — I2583 Coronary atherosclerosis due to lipid rich plaque: Principal | ICD-10-CM

## 2017-11-17 DIAGNOSIS — E785 Hyperlipidemia, unspecified: Secondary | ICD-10-CM

## 2017-11-20 ENCOUNTER — Other Ambulatory Visit: Payer: Self-pay | Admitting: Cardiovascular Disease

## 2017-11-20 DIAGNOSIS — I2583 Coronary atherosclerosis due to lipid rich plaque: Principal | ICD-10-CM

## 2017-11-20 DIAGNOSIS — I2510 Atherosclerotic heart disease of native coronary artery without angina pectoris: Secondary | ICD-10-CM

## 2017-11-20 DIAGNOSIS — E785 Hyperlipidemia, unspecified: Secondary | ICD-10-CM

## 2017-11-23 ENCOUNTER — Ambulatory Visit: Payer: Medicaid Other | Admitting: Thoracic Surgery (Cardiothoracic Vascular Surgery)

## 2017-12-31 ENCOUNTER — Ambulatory Visit (INDEPENDENT_AMBULATORY_CARE_PROVIDER_SITE_OTHER): Payer: Medicaid Other | Admitting: Physician Assistant

## 2017-12-31 ENCOUNTER — Encounter: Payer: Self-pay | Admitting: Physician Assistant

## 2017-12-31 VITALS — BP 134/60 | HR 73 | Resp 16 | Ht 61.0 in | Wt 163.2 lb

## 2017-12-31 DIAGNOSIS — I5042 Chronic combined systolic (congestive) and diastolic (congestive) heart failure: Secondary | ICD-10-CM | POA: Diagnosis not present

## 2017-12-31 DIAGNOSIS — I10 Essential (primary) hypertension: Secondary | ICD-10-CM

## 2017-12-31 DIAGNOSIS — E782 Mixed hyperlipidemia: Secondary | ICD-10-CM | POA: Diagnosis not present

## 2017-12-31 DIAGNOSIS — I25700 Atherosclerosis of coronary artery bypass graft(s), unspecified, with unstable angina pectoris: Secondary | ICD-10-CM

## 2017-12-31 DIAGNOSIS — L989 Disorder of the skin and subcutaneous tissue, unspecified: Secondary | ICD-10-CM

## 2017-12-31 NOTE — Progress Notes (Signed)
Cardiology Office Note    Date:  12/31/2017   ID:  Christina Morrison, DOB 1976-10-02, MRN [id number]  PCP:  Pearson GrippeKim, James, MD  Cardiologist:  Dr. Excell Seltzerooper CHF: Dr. Gala RomneyBensimhon  Chief Complaint: yearly follow up  History of Present Illness:   Christina Morrison is a 42 y.o. female with HTN, CAD, TIA, HLD, combined Systolic/Diastolic Biventricular HF and tobacco use presents for follow up.   Hx of CAD. Pt underwent stenting of LCX in 1/17. Readmitted for NSTEMI. Found to have 2v CAD with high-grade ostial LAD and LCX stensosis.  Pt underwent CABG 11/03/16 with Dr. Cornelius Moraswen with LIMA to LAD, SVG to DIAG and SVG to LCX. Developed VF arrest and had re-do sternotomy begun at bedside with cardiac massage. 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 Underwent re-do CABG x1 with SVG to OM and placement of biVAD support due to shock.  Hospital course additionally complicated by large hematoma with skin necrosis of LLE from open EVH harvest. Treated with ABX and wound care in hospital.   Last echo 07/2017 showed LVEF fo 45-50%, grade 2 DD, Mildly dilated and mildly dysfunctional right ventricle.  Last seen by Dr. Gala RomneyBensimhon 08/11/17. ? apical abnormality (looks like clot but unusual with good wall motion) on review of echo. Follow up cardiac MRI did not showed LV clot.   Here today for follow up. The still have having skin lesion all over her body despite discontinuation of Spironolactone by Dr. Gala RomneyBensimhon in August 2018.  Patient has been seen by a dermatologist without clear evidence.  It did improve with steroid however reoccurred.  She denies chest pain, shortness of breath, lower extremity edema, palpitation, orthopnea, PND, syncope or melena.  Compliant with medication.  Past Medical History:  Diagnosis Date  . Anxiety   . Bipolar 1 disorder (HCC)   . Cardiac arrest (HCC) 11/03/2016  . Cardiogenic shock (HCC) 11/03/2016  . Chronic pain   . Cocaine use   . Coronary artery disease    a. 03/2015 NSTEMI/PCI in setting of  cocaine use - BMS to diagonal. b. NSTEMI 12/2015 s/o overlapping DES to Cx, residual mild RCA and LAD disease, 75% OM1.  . Depression   . Dyslipidemia   . Migraine   . Osteoarthritis   . Polysubstance abuse (HCC)    a. tobacco/cocaine  . 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 Scoliosis   . TIA (transient ischemic attack)     Past Surgical History:  Procedure Laterality Date  . CARDIAC CATHETERIZATION  03/21/2015   Procedure: CORONARY STENT INTERVENTION;  Surgeon: Tonny BollmanMichael Cooper, MD;  [facility];  Service: Cardiovascular;;  diag bms 2.75 x 12 vision  . CARDIAC CATHETERIZATION N/A 01/14/2016   Procedure: Coronary Stent Intervention;  Surgeon: Tonny BollmanMichael Cooper, MD;  [facility];  Service: Cardiovascular;  Laterality: N/A;  . CARDIAC CATHETERIZATION N/A 10/31/2016   Procedure: Left Heart Cath and Coronary Angiography;  Surgeon: Yvonne Kendallhristopher End, MD;  [facility];  Service: Cardiovascular;  Laterality: N/A;  . CARDIAC CATHETERIZATION N/A 10/31/2016   Procedure: Intravascular Ultrasound/IVUS;  Surgeon: Yvonne Kendallhristopher End, MD;  Location: [facility] INVASIVE CV LAB;  Service: Cardiovascular;  Laterality: N/A;  . CORONARY ARTERY BYPASS GRAFT N/A 11/03/2016   Procedure: CORONARY ARTERY BYPASS GRAFTING (CABG) x  three, using left internal mammary artery and right leg greater saphenous vein harvested endoscopically;  Surgeon: Salvatore Decentlarence H  Cornelius Moras, MD;  Location: [facility] OR;  Service: Open Heart Surgery;  Laterality: N/A;  . CORONARY ARTERY BYPASS GRAFT N/A 11/03/2016   Procedure: EMERGENCY REDO STERNOTOMY IN ICU, REDO CABG X 1, PLACEMENT OF BILATERAL VAD, PLACEMENT OF LEFT FEMORAL ARTERIAL LINE;  Surgeon: Purcell Nails, MD;  Location: [facility] OR;  Service: Open Heart Surgery;  Laterality: N/A;  . CORONARY STENT PLACEMENT  03/21/2015   first diagonal  . ESOPHAGOGASTRODUODENOSCOPY (EGD) WITH PROPOFOL N/A 06/11/2015   Procedure:  ESOPHAGOGASTRODUODENOSCOPY (EGD) WITH PROPOFOL;  Surgeon: Bernette Redbird, MD;  Location[facility] ENDOSCOPY;  Service: Endoscopy;  Laterality: N/A;  . HEMATOMA EVACUATION Left 11/07/2016   Procedure: EVACUATION HEMATOMA;  Surgeon: Kerin Perna, MD;  Location: [facility] OR;  Service: Thoracic;  Laterality: Left;  . LEFT HEART CATHETERIZATION WITH CORONARY ANGIOGRAM N/A 03/21/2015   Procedure: LEFT HEART CATHETERIZATION WITH CORONARY ANGIOGRAM;  Surgeon: [name], MD;  Location: [facility] CATH LAB;  Service: Cardiovascular;  Laterality: N/A;  . LEFT HEART CATHETERIZATION WITH CORONARY ANGIOGRAM N/A 04/11/2015   Procedure: LEFT HEART CATHETERIZATION WITH CORONARY ANGIOGRAM;  Surgeon: Kathleene Hazel, MD;  Location: [facility] CATH LAB;  Service: Cardiovascular;  Laterality: N/A;  . REMOVAL OF CENTRIMAG VENTRICULAR ASSIST DEVICE N/A 11/07/2016   Procedure: REMOVAL OF CENTRIMAG VENTRICULAR ASSIST DEVICE;  Surgeon: Kerin Perna, MD;  Location: [facility] OR;  Service: Open Heart Surgery;  Laterality: N/A;  . STERNAL CLOSURE N/A 11/07/2016   Procedure: STERNAL WASHOUT WITH STERNAL CLOSURE;  Surgeon: Kerin Perna, MD;  Location[facility] OR;  Service: Thoracic;  Laterality: N/A;  . TEE WITHOUT CARDIOVERSION N/A 11/03/2016   Procedure: TRANSESOPHAGEAL ECHOCARDIOGRAM (TEE);  Surgeon: Purcell Nails, MD;  Location: [facility] OR;  Service: Open Heart Surgery;  Laterality: N/A;  . TEE WITHOUT CARDIOVERSION N/A 11/07/2016   Procedure: TRANSESOPHAGEAL ECHOCARDIOGRAM (TEE);  Surgeon: Kerin Perna, MD;  [facility];  Service: Thoracic;  Laterality: N/A;    Current Medications: Prior to Admission medications   Medication Sig Start Date End Date Taking? Authorizing Provider  acetaminophen (TYLENOL) 325 MG tablet Take 2 tablets (650 mg total) by mouth every 6 (six) hours as needed for mild pain or headache. 11/17/16   Ardelle Balls, PA-C  aspirin EC 81 MG EC tablet Take 1 tablet (81 mg total) by mouth daily. 03/23/15   Creig Hines, NP  atorvastatin (LIPITOR) 80 MG tablet TAKE 1 TABLET BY MOUTH EVERY DAY 08/18/17   Bensimhon, Bevelyn Buckles, MD  buPROPion (WELLBUTRIN XL) 300 MG 24 hr tablet Take 300 mg by mouth every morning. 10/20/16   [provider]  clopidogrel (PLAVIX) 75 MG tablet Take 1 tablet (75 mg total) by mouth daily. Needs office visit for further refills 11/18/17   Bensimhon, Bevelyn Buckles, MD  escitalopram (LEXAPRO) 20 MG tablet Take 20 mg by mouth daily. 01/18/16   [provider]  furosemide (LASIX) 40 MG tablet Take 40 mg by mouth daily as needed.    [provider]  furosemide (LASIX) 40 MG tablet TAKE 1 TABLET BY MOUTH TWICE A DAY IF DIZZY OR WEIGHT CONTINUES TO DECREASE PLEASE STOP 11/18/17   Bensimhon, Bevelyn Buckles, MD  metoprolol tartrate (LOPRESSOR) 25 MG tablet Take 0.5 tablets (12.5 mg total) by mouth 2 (two) times daily. 11/17/16   Ardelle Balls, PA-C  ondansetron (ZOFRAN-ODT) 4 MG disintegrating tablet Take 4 mg by mouth daily as needed for nausea or vomiting. DISSOLVE 10/19/16   [provider]  pantoprazole (PROTONIX) 40 MG  tablet PLEASE SEE ATTACHED FOR DETAILED DIRECTIONS 11/24/17   [name], MD    Allergies:   Ranexa [ranolazine]   Social History   Socioeconomic History  . Marital status: Divorced    Spouse name: None  . Number of children: None  . Years of education: None  . Highest education level: None  Social Needs  . Financial resource strain: None  . Food insecurity - worry: None  . Food insecurity - inability: None  . Transportation needs - medical: None  . Transportation needs - non-medical: None  Occupational History  . None  Tobacco Use  . Smoking status: Current Some Day Smoker    Packs/day: 0.10    Types: Cigarettes  . Smokeless tobacco: Never Used  Substance and Sexual Activity  . Alcohol use: No    Alcohol/week: 0.0 oz  . Drug use: No    Comment: Prior cocaine use  . Sexual activity: None  Other Topics Concern    . None  Social History Narrative  . None     Family History:  The patient's family history includes Cancer in her maternal aunt; Depression in her mother; Diabetes in her father; Heart disease in her father and mother; Hyperlipidemia in her father and mother; Hypertension in her father and mother; Mental retardation in her father and mother; Stroke in her maternal grandmother.   ROS:   Please see the history of present illness.    ROS All other systems reviewed and are negative.   PHYSICAL EXAM:   VS:  BP 134/60   Pulse 73   Resp 16   Ht 5\' 1"  (1.549 m)   Wt 163 lb 2.9 oz (74 kg)   LMP 12/19/2017 (Within Weeks)   BMI 30.83 kg/m    GEN: Well nourished, well developed, in no acute distress  HEENT: normal  Neck: no JVD, carotid bruits, or masses Cardiac: RRR; no murmurs, rubs, or gallops,no edema  Respiratory:  clear to auscultation bilaterally, normal work of breathing GI: soft, nontender, nondistended, + BS MS: no deformity or atrophy  Skin: warm and dry, skin lesion all over her body Neuro:  Alert and Oriented x 3, Strength and sensation are intact Psych: euthymic mood, full affect  Wt Readings from Last 3 Encounters:  12/31/17 163 lb 2.9 oz (74 kg)  08/11/17 150 lb 12.8 oz (68.4 kg)  02/02/17 157 lb (71.2 kg)      Studies/Labs Reviewed:   EKG:  EKG is ordered today.  The ekg ordered today demonstrates normal sinus rhythm  Recent Labs: 01/07/2017: BUN 12; Hemoglobin 14.2; Platelets 229; Potassium 4.2; Sodium 137 09/02/2017: Creatinine, Ser 0.79   Lipid Panel    Component Value Date/Time   CHOL 185 03/24/2016 1113   TRIG 141 03/24/2016 1113   HDL 35 (L) 03/24/2016 1113   CHOLHDL 5.3 (H) 03/24/2016 1113   VLDL 28 03/24/2016 1113   LDLCALC 122 03/24/2016 1113    Additional studies/ records that were reviewed today include:   Echocardiogram: 08/11/2017 Study Conclusions  - Left ventricle: The cavity size was normal. Wall thickness was   normal. Septal  bounce consistent with prior cardiac surgery.   Inferior severe hypokinesis. Basal to mid inferolateral   hypokinesis. Systolic function was mildly reduced. The estimated   ejection fraction was in the range of 45% to 50%. Features are   consistent with a pseudonormal left ventricular filling pattern,   with concomitant abnormal relaxation and increased filling   pressure (grade 2 diastolic dysfunction). -  Aortic valve: There was no stenosis. - Mitral valve: There was trivial regurgitation. - Right ventricle: The cavity size was mildly dilated. Systolic   function was mildly reduced. - Tricuspid valve: Peak RV-RA gradient (S): 23 mm Hg. - Pulmonary arteries: PA peak pressure: 26 mm Hg (S). - Inferior vena cava: The vessel was normal in size. The   respirophasic diameter changes were in the normal range (>= 50%),   consistent with normal central venous pressure.  Impressions:  - Normal LV size with EF 45-50%. Wall motion abnormalities as noted   above. Moderate diastolic dysfunction. Mildly dilated and mildly   dysfunctional right ventricle.  Cardiac MRI 09/02/17 IMPRESSION: 1. Normal LV size with EF 57%. Wall motion abnormalities as noted above. The true apex was akinetic and tethered to the overlying pericardium. No definite LV apical thrombus noted.  2.  Normal RV size and systolic function.  3.  LGE pattern as above.  This is a coronary disease distribution.  Dalton Mclean    ASSESSMENT & PLAN:    1. CAD s/p CABG -No angina.  Continue aspirin 81 mg, Lipitor 80 mg and Lopressor 12.5 mg twice daily.  We will discontinue Plavix as she is greater than 12 months of her PCI and possible skin lesions.  Discussed with Dr. Excell Seltzer and pharmacist.  2. Combined Systolic/Diastolic Biventricular HF  - followed by Dr. Gala Romney - Last echo 07/2017 showed LVEF fo 45-50%, grade 2 DD, Mildly dilated and mildly  dysfunctional right ventricle. - No Lv clot on follow up cardiac MRI  08/2017 -Continue beta-blocker.  3. HTN -Stable on current medication.  4. HLD -Continue Lipitor.      Medication Adjustments/Labs and Tests Ordered: Current medicines are reviewed at length with the patient today.  Concerns regarding medicines are outlined above.  Medication changes, Labs and Tests ordered today are listed in the Patient Instructions below. Patient Instructions  Medication Instructions:   STOP TAKING  PLAVIX  75 MG   If you need a refill on your cardiac medications before your next appointment, please call your pharmacy.  Labwork: NONE ORDERED  TODAY   Testing/Procedures: NONE ORDERED  TODAY    Follow-Up:  DR Gala Romney IN 2 MONTHS   Your physician wants you to follow-up in:  IN  6  MONTHS WITH DR  Excell Seltzer  You will receive a reminder letter in the mail two months in advance. If you don't receive a letter, please call our office to schedule the follow-up appointment.      Any Other Special Instructions Will Be Listed Below (If Applicable).                                                                                                                                                      Vonzella Nipple [place], [place]  12/31/2017 3:24 PM  [facility] [place], [place], [place]  [address] Phone: [phone]; Fax: [phone]

## 2017-12-31 NOTE — Patient Instructions (Addendum)
Medication Instructions:   STOP TAKING  PLAVIX  75 MG   If you need a refill on your cardiac medications before your next appointment, please call your pharmacy.  Labwork: NONE ORDERED  TODAY   Testing/Procedures: NONE ORDERED  TODAY    Follow-Up:  DR Gala RomneyBENSIMHON IN 2 MONTHS   Your physician wants you to follow-up in:  IN  6  MONTHS WITH DR  Excell SeltzerOOPER  You will receive a reminder letter in the mail two months in advance. If you don't receive a letter, please call our office to schedule the follow-up appointment.      Any Other Special Instructions Will Be Listed Below (If Applicable).

## 2018-01-18 ENCOUNTER — Ambulatory Visit: Payer: Medicaid Other | Admitting: Thoracic Surgery (Cardiothoracic Vascular Surgery)

## 2018-01-30 ENCOUNTER — Other Ambulatory Visit (HOSPITAL_COMMUNITY): Payer: Self-pay | Admitting: Internal Medicine

## 2018-02-27 ENCOUNTER — Other Ambulatory Visit (HOSPITAL_COMMUNITY): Payer: Self-pay | Admitting: Internal Medicine

## 2018-03-01 ENCOUNTER — Ambulatory Visit: Payer: Medicaid Other | Admitting: Thoracic Surgery (Cardiothoracic Vascular Surgery)

## 2018-03-17 ENCOUNTER — Ambulatory Visit (HOSPITAL_COMMUNITY)
Admission: RE | Admit: 2018-03-17 | Discharge: 2018-03-17 | Disposition: A | Discharge status: Home / Self Care | Payer: Medicaid Other | Source: Ambulatory Visit | Attending: Internal Medicine | Admitting: Internal Medicine

## 2018-03-17 ENCOUNTER — Encounter (HOSPITAL_COMMUNITY): Payer: Self-pay | Admitting: Internal Medicine

## 2018-03-17 ENCOUNTER — Other Ambulatory Visit: Payer: Self-pay

## 2018-03-17 VITALS — BP 124/90 | HR 89 | Wt 170.0 lb

## 2018-03-17 DIAGNOSIS — Z7982 Long term (current) use of aspirin: Secondary | ICD-10-CM | POA: Diagnosis not present

## 2018-03-17 DIAGNOSIS — I10 Essential (primary) hypertension: Secondary | ICD-10-CM | POA: Insufficient documentation

## 2018-03-17 DIAGNOSIS — Z8674 Personal history of sudden cardiac arrest: Secondary | ICD-10-CM | POA: Diagnosis not present

## 2018-03-17 DIAGNOSIS — Z7902 Long term (current) use of antithrombotics/antiplatelets: Secondary | ICD-10-CM | POA: Insufficient documentation

## 2018-03-17 DIAGNOSIS — I252 Old myocardial infarction: Secondary | ICD-10-CM | POA: Insufficient documentation

## 2018-03-17 DIAGNOSIS — F419 Anxiety disorder, unspecified: Secondary | ICD-10-CM | POA: Diagnosis not present

## 2018-03-17 DIAGNOSIS — F319 Bipolar disorder, unspecified: Secondary | ICD-10-CM | POA: Diagnosis not present

## 2018-03-17 DIAGNOSIS — Z951 Presence of aortocoronary bypass graft: Secondary | ICD-10-CM | POA: Diagnosis not present

## 2018-03-17 DIAGNOSIS — Z720 Tobacco use: Secondary | ICD-10-CM

## 2018-03-17 DIAGNOSIS — I5042 Chronic combined systolic (congestive) and diastolic (congestive) heart failure: Secondary | ICD-10-CM

## 2018-03-17 DIAGNOSIS — I2510 Atherosclerotic heart disease of native coronary artery without angina pectoris: Secondary | ICD-10-CM | POA: Insufficient documentation

## 2018-03-17 DIAGNOSIS — E785 Hyperlipidemia, unspecified: Secondary | ICD-10-CM | POA: Diagnosis not present

## 2018-03-17 DIAGNOSIS — Z8673 Personal history of transient ischemic attack (TIA), and cerebral infarction without residual deficits: Secondary | ICD-10-CM | POA: Diagnosis not present

## 2018-03-17 DIAGNOSIS — G8929 Other chronic pain: Secondary | ICD-10-CM | POA: Diagnosis not present

## 2018-03-17 DIAGNOSIS — M1990 Unspecified osteoarthritis, unspecified site: Secondary | ICD-10-CM | POA: Insufficient documentation

## 2018-03-17 DIAGNOSIS — E8770 Fluid overload, unspecified: Secondary | ICD-10-CM | POA: Insufficient documentation

## 2018-03-17 DIAGNOSIS — F17210 Nicotine dependence, cigarettes, uncomplicated: Secondary | ICD-10-CM | POA: Diagnosis not present

## 2018-03-17 DIAGNOSIS — Z79899 Other long term (current) drug therapy: Secondary | ICD-10-CM | POA: Diagnosis not present

## 2018-03-17 LAB — BASIC METABOLIC PANEL
Anion gap: 11 (ref 5–15)
BUN: 9 mg/dL (ref 6–20)
CO2: 28 mmol/L (ref 22–32)
CREATININE: 0.9 mg/dL (ref 0.44–1.00)
Calcium: 9.4 mg/dL (ref 8.9–10.3)
Chloride: 96 mmol/L — ABNORMAL LOW (ref 101–111)
Glucose, Bld: 133 mg/dL — ABNORMAL HIGH (ref 65–99)
POTASSIUM: 4.1 mmol/L (ref 3.5–5.1)
Sodium: 135 mmol/L (ref 135–145)

## 2018-03-17 MED ORDER — FUROSEMIDE 40 MG PO TABS: 40.0000 mg | ORAL_TABLET | Freq: Every day | ORAL | 6 refills | Status: DC

## 2018-03-17 NOTE — Progress Notes (Signed)
Advanced Heart Failure Clinic Note   Primary Care: Dr. Pearson GrippeJames Kim  Primary Cardiologist: Dr Excell Seltzerooper Primary HF: Dr. Gala RomneyBensimhon  CT Surgeon: Dr. Cornelius Moraswen Dermatology: Dr Adolphus Birchwoodasher   HPI: Evalyn CascoKelly Easterly is a 42 y.o. female with HTN, CAD and tobacco use. Pt underwent stenting of LCX in 1/17. Readmitted for NSTEMI. Found to have 2v CAD with high-grade ostial LAD and LCX stensosis.   Pt underwent CABG 11/03/16 with Dr. Cornelius Moraswen with LIMA to LAD, SVG to DIAG and SVG to LCX. Developed VF arrest and had re-do sternotomy begun at bedside with cardiac massage. 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 Underwent re-do CABG x1 with SVG to OM and placement of biVAD support due to shock.  Hospital course additionally complicated by large hematoma with skin necrosis of LLE from open EVH harvest. Treated with ABX and wound care in hospital.   Today she returns for HF follow up. Overall feeling ok. Having difficulty with stress and anxiety because she is caring for her boyfriend. Denies SOB/PND/Orthopnea. Appetite ok. No fever or chills. Eating high sodium foods. Weight has been going up. Smoking 7 cigarettes per day. Taking all medications except she is only taking lasix every other day.   ECHO 07/2017  Normal LV size with EF 45-50%. Wall motion abnormalities as noted   above. Moderate diastolic dysfunction. Mildly dilated and mildly   dysfunctional right ventricle.  CMRI  08/2018  1. Normal LV size with EF 57%. Wall motion abnormalities as noted above. The true apex was akinetic and tethered to the overlying pericardium. No definite LV apical thrombus noted. 2.  Normal RV size and systolic function. 3.  LGE pattern as above.  This is a coronary disease distribution.  Past Medical History:  Diagnosis Date  . Anxiety   . Bipolar 1 disorder (HCC)   . Cardiac arrest (HCC) 11/03/2016  . Cardiogenic shock (HCC) 11/03/2016  . Chronic pain   . Cocaine use   . Coronary artery disease    a. 03/2015 NSTEMI/PCI in setting of  cocaine use - BMS to diagonal. b. NSTEMI 12/2015 s/o overlapping DES to Cx, residual mild RCA and LAD disease, 75% OM1.  . Depression   . Dyslipidemia   . Migraine   . Osteoarthritis   . Polysubstance abuse (HCC)    a. tobacco/cocaine  . 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 Scoliosis   . TIA (transient ischemic attack)     Current Outpatient Medications  Medication Sig Dispense Refill  . acetaminophen (TYLENOL) 325 MG tablet Take 2 tablets (650 mg total) by mouth every 6 (six) hours as needed for mild pain or headache.    [name]. aspirin EC 81 MG EC tablet Take 1 tablet (81 mg total) by mouth daily.    [name]. atomoxetine (STRATTERA) 60 MG capsule TAKE 1 BY MOUTH EVERY MORNING  2  . atorvastatin (LIPITOR) 80 MG tablet TAKE 1 TABLET BY MOUTH EVERY DAY 30 tablet 11  . buPROPion (WELLBUTRIN XL) 300 MG 24 hr tablet Take 300 mg by mouth every morning.  1  . clobetasol ointment (TEMOVATE) 0.05 % APPLY TO AFFECTED AREA EXTERNALLY TWICE DAILY  6  . doxepin (SINEQUAN) 25 MG capsule Take 25 mg by mouth at bedtime.  2  . escitalopram (LEXAPRO) 20 MG tablet Take 20 mg by mouth daily.  3  . furosemide (LASIX) 40 MG tablet Take 1 tablet (40 mg  total) by mouth daily. Take extra tab as needed 45 tablet 6  . metoprolol tartrate (LOPRESSOR) 25 MG tablet Take 0.5 tablets (12.5 mg total) by mouth 2 (two) times daily. 30 tablet 1  . metoprolol tartrate (LOPRESSOR) 25 MG tablet TAKE 1/2 TABLET TWICE A DAY 90 tablet 2  . metoprolol tartrate (LOPRESSOR) 25 MG tablet TAKE 1/2 TABLET TWICE A DAY 90 tablet 2  . ondansetron (ZOFRAN-ODT) 4 MG disintegrating tablet Take 4 mg by mouth daily as needed for nausea or vomiting. DISSOLVE  3  . OXcarbazepine (TRILEPTAL) 150 MG tablet TAKE 2 TABLETS IN THE MORNING,1 TABLET MIDDAY,AND 2 TABLETS AT BEDTIME  2  . Oxycodone HCl 10 MG TABS TAKE 1 TABLET 3 TIMES A DAY AS NEEDED FOR 30 DAYS  0  . pantoprazole (PROTONIX) 40 MG tablet  PLEASE SEE ATTACHED FOR DETAILED DIRECTIONS 15 tablet 0   No current facility-administered medications for this encounter.     Allergies  Allergen Reactions  . Ranexa [Ranolazine] Nausea Only    Dizziness (only the 1,000 mg dose has this effect)      Social History   Socioeconomic History  . Marital status: Divorced    Spouse name: Not on file  . Number of children: Not on file  . Years of education: Not on file  . Highest education level: Not on file  Occupational History  . Not on file  Social Needs  . Financial resource strain: Not on file  . Food insecurity:    Worry: Not on file    Inability: Not on file  . Transportation needs:    Medical: Not on file    Non-medical: Not on file  Tobacco Use  . Smoking status: Current Some Day Smoker    Packs/day: 0.10    Types: Cigarettes  . Smokeless tobacco: Never Used  Substance and Sexual Activity  . Alcohol use: No    Alcohol/week: 0.0 oz  . Drug use: No    Types: Cocaine    Comment: Prior cocaine use  . Sexual activity: Not on file  Lifestyle  . Physical activity:    Days per week: Not on file    Minutes per session: Not on file  . Stress: Not on file  Relationships  . Social connections:    Talks on phone: Not on file    Gets together: Not on file    Attends religious service: Not on file    Active member of club or organization: Not on file    Attends meetings of clubs or organizations: Not on file    Relationship status: Not on file  . Intimate partner violence:    Fear of current or ex partner: Not on file    Emotionally abused: Not on file    Physically abused: Not on file    Forced sexual activity: Not on file  Other Topics Concern  . Not on file  Social History Narrative  . Not on file      Family History  Problem Relation Age of Onset  . Mental retardation Mother   . Hypertension Mother   . Hyperlipidemia Mother   . Heart disease Mother   . Depression Mother   . Hypertension Father   .  Hyperlipidemia Father   . Heart disease Father   . Mental retardation Father   . Diabetes Father   . Cancer Maternal Aunt   . Stroke Maternal Grandmother     Vitals:   03/17/18 1402  BP:  124/90  Pulse: 89  SpO2: 98%  Weight: 170 lb (77.1 kg)   Wt Readings from Last 3 Encounters:  03/17/18 170 lb (77.1 kg)  12/31/17 163 lb 2.9 oz (74 kg)  08/11/17 150 lb 12.8 oz (68.4 kg)    PHYSICAL EXAM: General:   No resp difficulty HEENT: normal Neck: supple. JVP ~10. Carotids 2+ bilat; no bruits. No lymphadenopathy or thryomegaly appreciated. Cor: PMI nondisplaced. Regular rate & rhythm. No rubs, gallops or murmurs. Lungs: clear Abdomen: soft, nontender, nondistended. No hepatosplenomegaly. No bruits or masses. Good bowel sounds. Extremities: no cyanosis, clubbing, rash, edema Neuro: alert & orientedx3, cranial nerves grossly intact. moves all 4 extremities w/o difficulty. Affect pleasant  ASSESSMENT & PLAN: 1. CAD s/p CABG with emergent re-do requiring BIVAD support.  - No s/s ischemia   - Continue ASA, plavix, statin. - Continue Lopressor 12.5 mg BID.  2. Combined Systolic/Diastolic Biventricular HF - Echo 11/13/2016  EF 50-55% RV   -ECHO 07/2017 LVEF 50% RV mildly hypokinetic. Grade IIDD - CMRI 08/2017 LVEF 57% RV normal  Coronary disease.  Mild volume overload. Continue lasix 40 mg daily + extra 40 mg lasix for the next 2 days. Reinforced low salt food choices and daily weights.  -3. Tobacco use Discussed smoking cessation.   Follow up in 6 months [name], NP

## 2018-03-17 NOTE — Patient Instructions (Signed)
Increase Furosemide to 80 mg (2 tabs) daily for 2 days, then back to 40 mg daily  Labs today  We will contact you in 3 months to schedule your next appointment.

## 2018-06-16 ENCOUNTER — Telehealth: Payer: Self-pay | Admitting: Oncology

## 2018-06-16 ENCOUNTER — Encounter: Payer: Self-pay | Admitting: Oncology

## 2018-06-16 NOTE — Telephone Encounter (Signed)
New referral received from Dr[facility] Casimer LaniusGovinda [facility]al [facility]: high titer antiphospholipid AB positive. Pt has been scheduled to see Dr. Clelia CroftShadad on 7/25 at 2pm. Pt aware to arrive 30 minutes early. Letter mailed

## 2018-07-08 ENCOUNTER — Inpatient Hospital Stay: Payer: Medicaid Other | Attending: Oncology | Admitting: Oncology

## 2018-07-08 VITALS — BP 100/72 | HR 66 | Temp 98.1°F | Resp 17 | Ht 61.0 in | Wt 175.2 lb

## 2018-07-08 DIAGNOSIS — M329 Systemic lupus erythematosus, unspecified: Secondary | ICD-10-CM | POA: Diagnosis not present

## 2018-07-08 DIAGNOSIS — M3500 Sicca syndrome, unspecified: Secondary | ICD-10-CM | POA: Diagnosis not present

## 2018-07-08 DIAGNOSIS — R760 Raised antibody titer: Secondary | ICD-10-CM | POA: Diagnosis present

## 2018-07-08 DIAGNOSIS — Z7982 Long term (current) use of aspirin: Secondary | ICD-10-CM | POA: Diagnosis not present

## 2018-07-08 NOTE — Progress Notes (Signed)
Reason for the request: Antiphospholipid antibodies.  HPI: I was asked by Dr. Deanne CofferAryal to evaluate Christina Morrison for antiphospholipid antibodies.  She is a pleasant 42 year old woman with history of anxiety and bipolar disorder as well as history of coronary artery disease.  She is status post bypass graft in 2017 and required BiVAD support because of cardiogenic shock.  She is started developing skin rash and polyarthralgia and stiffness.  She was evaluated by Dr. Deanne CofferAryal for autoimmune disorder including connective tissue disease with Sjogren's syndrome.  She was found to have a positive ANA with positive SSA and SSB.  She also had an elevated anticardiolipin IgG, IgM and beta-2 glycoprotein.  He denies any deep vein thrombosis, pulmonary embolism or superficial phlebitis.  She has been on Plavix after stent placement and have been switched to aspirin at this time.  Clinically, she has a number symptoms including excessive fatigue and tiredness, skin rash and occasional nausea.  She denies any vomiting or diarrhea.  She does not report any headaches, blurry vision, syncope or seizures. Does not report any fevers, chills or sweats.  Does not report any cough, wheezing or hemoptysis.  Does not report any chest pain, palpitation, orthopnea or leg edema.  Does not report any vomiting or abdominal pain.  Does not report any constipation or diarrhea.  Does not report any skeletal complaints.    Does not report frequency, urgency or hematuria.  Does not report any skin rashes or lesions. Does not report any heat or cold intolerance.  Does not report any lymphadenopathy or petechiae.  Does not report any anxiety or depression.  Remaining review of systems is negative.    Past Medical History:  Diagnosis Date  . Anxiety   . Bipolar 1 disorder (HCC)   . Cardiac arrest (HCC) 11/03/2016  . Cardiogenic shock (HCC) 11/03/2016  . Chronic pain   . Cocaine use   . Coronary artery disease    a. 03/2015 NSTEMI/PCI in  setting of cocaine use - BMS to diagonal. b. NSTEMI 12/2015 s/o overlapping DES to Cx, residual mild RCA and LAD disease, 75% OM1.  . Depression   . Dyslipidemia   . Migraine   . Osteoarthritis   . Polysubstance abuse (HCC)    a. tobacco/cocaine  . 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 Scoliosis   . TIA (transient ischemic attack)   :  Past Surgical History:  Procedure Laterality Date  . CARDIAC CATHETERIZATION  03/21/2015   Procedure: CORONARY STENT INTERVENTION;  Surgeon: Tonny BollmanMichael Cooper, MD;  [facility];  Service: Cardiovascular;;  diag bms 2.75 x 12 vision  . CARDIAC CATHETERIZATION N/A 01/14/2016   Procedure: Coronary Stent Intervention;  Surgeon: Tonny BollmanMichael Cooper, MD;  [facility];  Service: Cardiovascular;  Laterality: N/A;  . CARDIAC CATHETERIZATION N/A 10/31/2016   Procedure: Left Heart Cath and Coronary Angiography;  Surgeon: Yvonne Kendallhristopher End, MD;  [facility];  Service: Cardiovascular;  Laterality: N/A;  . CARDIAC CATHETERIZATION N/A 10/31/2016   Procedure: Intravascular Ultrasound/IVUS;  Surgeon: Yvonne Kendallhristopher End, MD;  Location: [facility] INVASIVE CV LAB;  Service: Cardiovascular;  Laterality: N/A;  . CORONARY ARTERY BYPASS GRAFT N/A 11/03/2016   Procedure: CORONARY ARTERY BYPASS GRAFTING (CABG) x  three, using left internal mammary artery and right leg greater saphenous vein harvested endoscopically;  Surgeon: Purcell Nailslarence H Owen, MD;  Location: [facility] OR;  Service: Open Heart  Surgery;  Laterality: N/A;  . CORONARY ARTERY BYPASS GRAFT N/A 11/03/2016   Procedure: EMERGENCY REDO STERNOTOMY IN ICU, REDO CABG X 1, PLACEMENT OF BILATERAL VAD, PLACEMENT OF LEFT FEMORAL ARTERIAL LINE;  Surgeon: Purcell Nails, MD;  Location: [facility] OR;  Service: Open Heart Surgery;  Laterality: N/A;  . CORONARY STENT PLACEMENT  03/21/2015   first diagonal  . ESOPHAGOGASTRODUODENOSCOPY (EGD) WITH PROPOFOL N/A 06/11/2015    Procedure: ESOPHAGOGASTRODUODENOSCOPY (EGD) WITH PROPOFOL;  Surgeon: Bernette Redbird, MD;  Location: [facility] ENDOSCOPY;  Service: Endoscopy;  Laterality: N/A;  . HEMATOMA EVACUATION Left 11/07/2016   Procedure: EVACUATION HEMATOMA;  Surgeon: Kerin Perna, MD;  Location: [facility] OR;  Service: Thoracic;  Laterality: Left;  . LEFT HEART CATHETERIZATION WITH CORONARY ANGIOGRAM N/A 03/21/2015   Procedure: LEFT HEART CATHETERIZATION WITH CORONARY ANGIOGRAM;  Surgeon: [name], MD;  Location: [facility] CATH LAB;  Service: Cardiovascular;  Laterality: N/A;  . LEFT HEART CATHETERIZATION WITH CORONARY ANGIOGRAM N/A 04/11/2015   Procedure: LEFT HEART CATHETERIZATION WITH CORONARY ANGIOGRAM;  Surgeon: Kathleene Hazel, MD;  Location: [facility] CATH LAB;  Service: Cardiovascular;  Laterality: N/A;  . REMOVAL OF CENTRIMAG VENTRICULAR ASSIST DEVICE N/A 11/07/2016   Procedure: REMOVAL OF CENTRIMAG VENTRICULAR ASSIST DEVICE;  Surgeon: Kerin Perna, MD;  Location: [facility] OR;  Service: Open Heart Surgery;  Laterality: N/A;  . STERNAL CLOSURE N/A 11/07/2016   Procedure: STERNAL WASHOUT WITH STERNAL CLOSURE;  Surgeon: Kerin Perna, MD;  Location[facility] OR;  Service: Thoracic;  Laterality: N/A;  . TEE WITHOUT CARDIOVERSION N/A 11/03/2016   Procedure: TRANSESOPHAGEAL ECHOCARDIOGRAM (TEE);  Surgeon: Purcell Nails, MD;  Location: [facility] OR;  Service: Open Heart Surgery;  Laterality: N/A;  . TEE WITHOUT CARDIOVERSION N/A 11/07/2016   Procedure: TRANSESOPHAGEAL ECHOCARDIOGRAM (TEE);  Surgeon: Kerin Perna, MD;  Location: [facility] OR;  Service: Thoracic;  Laterality: N/A;  :   Current Outpatient Medications:  .  acetaminophen (TYLENOL) 325 MG tablet, Take 2 tablets (650 mg total) by mouth every 6 (six) hours as needed for mild pain or headache., Disp: , Rfl:  .  aspirin EC 81 MG EC tablet, Take 1 tablet (81 mg total) by mouth daily., Disp: , Rfl:  .  atomoxetine (STRATTERA) 60 MG capsule, TAKE 1 BY MOUTH EVERY MORNING, Disp: , Rfl: 2 .   atorvastatin (LIPITOR) 80 MG tablet, TAKE 1 TABLET BY MOUTH EVERY DAY, Disp: 30 tablet, Rfl: 11 .  buPROPion (WELLBUTRIN XL) 300 MG 24 hr tablet, Take 300 mg by mouth every morning., Disp: , Rfl: 1 .  clobetasol ointment (TEMOVATE) 0.05 %, APPLY TO AFFECTED AREA EXTERNALLY TWICE DAILY, Disp: , Rfl: 6 .  doxepin (SINEQUAN) 25 MG capsule, Take 25 mg by mouth at bedtime., Disp: , Rfl: 2 .  escitalopram (LEXAPRO) 20 MG tablet, Take 20 mg by mouth daily., Disp: , Rfl: 3 .  furosemide (LASIX) 40 MG tablet, Take 1 tablet (40 mg total) by mouth daily. Take extra tab as needed, Disp: 45 tablet, Rfl: 6 .  metoprolol tartrate (LOPRESSOR) 25 MG tablet, Take 0.5 tablets (12.5 mg total) by mouth 2 (two) times daily., Disp: 30 tablet, Rfl: 1 .  ondansetron (ZOFRAN-ODT) 4 MG disintegrating tablet, Take 4 mg by mouth daily as needed for nausea or vomiting. DISSOLVE, Disp: , Rfl: 3 .  OXcarbazepine (TRILEPTAL) 150 MG tablet, TAKE 2 TABLETS IN THE MORNING,1 TABLET MIDDAY,AND 2 TABLETS AT BEDTIME, Disp: , Rfl: 2 .  Oxycodone HCl 10 MG TABS, TAKE 1 TABLET 3 TIMES  A DAY AS NEEDED FOR 30 DAYS, Disp: , Rfl: 0 .  pantoprazole (PROTONIX) 40 MG tablet, PLEASE SEE ATTACHED FOR DETAILED DIRECTIONS, Disp: 15 tablet, Rfl: 0:  Allergies  Allergen Reactions  . Ranexa [Ranolazine] Nausea Only    Dizziness (only the 1,000 mg dose has this effect)  :  Family History  Problem Relation Age of Onset  . Mental retardation Morrison   . Hypertension Morrison   . Hyperlipidemia Morrison   . Heart disease Morrison   . Depression Morrison   . Hypertension Father   . Hyperlipidemia Father   . Heart disease Father   . Mental retardation Father   . Diabetes Father   . Cancer Maternal Aunt   . Stroke Maternal Grandmother   :  Social History   Socioeconomic History  . Marital status: Divorced    Spouse name: Not on file  . Number of children: Not on file  . Years of education: Not on file  . Highest education level: Not on file   Occupational History  . Not on file  Social Needs  . Financial resource strain: Not on file  . Food insecurity:    Worry: Not on file    Inability: Not on file  . Transportation needs:    Medical: Not on file    Non-medical: Not on file  Tobacco Use  . Smoking status: Current Some Day Smoker    Packs/day: 0.10    Types: Cigarettes  . Smokeless tobacco: Never Used  Substance and Sexual Activity  . Alcohol use: No    Alcohol/week: 0.0 oz  . Drug use: No    Types: Cocaine    Comment: Prior cocaine use  . Sexual activity: Not on file  Lifestyle  . Physical activity:    Days per week: Not on file    Minutes per session: Not on file  . Stress: Not on file  Relationships  . Social connections:    Talks on phone: Not on file    Gets together: Not on file    Attends religious service: Not on file    Active member of club or organization: Not on file    Attends meetings of clubs or organizations: Not on file    Relationship status: Not on file  . Intimate partner violence:    Fear of current or ex partner: Not on file    Emotionally abused: Not on file    Physically abused: Not on file    Forced sexual activity: Not on file  Other Topics Concern  . Not on file  Social History Narrative  . Not on file  :  Pertinent items are noted in HPI.  Exam: Blood pressure 100/72, pulse 66, temperature 98.1 F (36.7 C), temperature source Oral, resp. rate 17, height 5\' 1"  (1.549 m), weight 175 lb 3.2 oz (79.5 kg), SpO2 99 %.  ECOG 1 General appearance: alert and cooperative appeared without distress. Head: atraumatic without any abnormalities. Eyes: conjunctivae/corneas clear. PERRL.  Sclera anicteric. Throat: lips, mucosa, and tongue normal; without oral thrush or ulcers. Resp: clear to auscultation bilaterally without rhonchi, wheezes or dullness to percussion. Cardio: regular rate and rhythm, S1, S2 normal, no murmur, click, rub or gallop GI: soft, non-tender; bowel sounds  normal; no masses,  no organomegaly Skin: Multiple areas of erythema noted on Christina upper extremity and trunk.  Scarred lesions were also noted in multiple locations. Lymph nodes: Cervical, supraclavicular, and axillary nodes normal. Neurologic: Grossly normal without any  motor, sensory or deep tendon reflexes. Musculoskeletal: No joint deformity or effusion.  CBC    Component Value Date/Time   WBC 6.7 01/07/2017 1445   RBC 4.60 01/07/2017 1445   HGB 14.2 01/07/2017 1445   HCT 42.4 01/07/2017 1445   PLT 229 01/07/2017 1445   MCV 92.2 01/07/2017 1445   MCH 30.9 01/07/2017 1445   MCHC 33.5 01/07/2017 1445   RDW 14.2 01/07/2017 1445   LYMPHSABS 1.1 11/26/2016 0944   MONOABS 0.3 11/26/2016 0944   EOSABS 0.4 11/26/2016 0944   BASOSABS 0.0 11/26/2016 0944     Chemistry      Component Value Date/Time   NA 135 03/17/2018 1447   K 4.1 03/17/2018 1447   CL 96 (L) 03/17/2018 1447   CO2 28 03/17/2018 1447   BUN 9 03/17/2018 1447   CREATININE 0.90 03/17/2018 1447   CREATININE 0.79 12/20/2015 1218      Component Value Date/Time   CALCIUM 9.4 03/17/2018 1447   ALKPHOS 79 11/11/2016 0500   AST 49 (H) 11/11/2016 0500   ALT 35 11/11/2016 0500   BILITOT 1.8 (H) 11/11/2016 0500       Assessment and Plan:   42 year old woman with the following:  1.  Elevated anticardiolipin antibody IgG, IgM and beta-2 glycoprotein.  This is in the setting of a positive ANA and suspected connective tissue disorder with possible Sjogren's disease and lupus.  The implication of these findings were discussed today with the patient and Christina Morrison.  She has 1 sister that has rheumatoid arthritis and is possible that she has autoimmune disease that is manifesting itself with these findings.  Systemic lupus is a certainly a possibility which could explain Christina elevated antiphospholipid antibodies.  Despite the elevation of Christina antiphospholipid antibody titers, she does not have anything to suggest antiphospholipid  syndrome.  She had no pregnancy complications or thrombosis episodes.  From a management standpoint, I see no reason for full dose anticoagulation at this time.  She is on aspirin which should offer reasonable arterial protection at this time.  We have discussed strategies to decrease Christina risk of developing deep vein thrombosis.  This includes increase activity and avoiding prolonged immobilization.  Aggressive DVT prophylaxis will be needed if she is hospitalized.  She understands if she develops arterial or venous thrombosis, she will require lifetime anticoagulation with full dose anticoagulation with Xarelto, Eliquis or warfarin.  2.  Suspected autoimmune disorder: Christina work-up indicate systemic lupus as well as Sjogren's disease.  She continues to follow-up with Dr. Janace Aris regarding this issue and a encouraged Christina to start treatment per his recommendation.  She has been prescribed Plaquenil which I recommended that she starts this treatment.  3.  Follow-up: I am happy to see Christina in the future as needed.  45  minutes was spent with the patient face-to-face today.  More than 50% of time was dedicated to patient counseling, education and coordination of hercare.     Thank you for the referral. A copy of this consult has been forwarded to the requesting physician.

## 2018-07-09 ENCOUNTER — Telehealth: Payer: Self-pay | Admitting: Oncology

## 2018-07-09 NOTE — Telephone Encounter (Signed)
No orders or referrals per 7/25 los.  °

## 2018-09-12 ENCOUNTER — Other Ambulatory Visit: Payer: Self-pay | Admitting: Internal Medicine

## 2018-11-13 ENCOUNTER — Other Ambulatory Visit (HOSPITAL_COMMUNITY): Payer: Self-pay | Admitting: Internal Medicine

## 2019-04-14 ENCOUNTER — Other Ambulatory Visit (HOSPITAL_COMMUNITY): Payer: Self-pay | Admitting: Internal Medicine

## 2019-05-28 ENCOUNTER — Other Ambulatory Visit (HOSPITAL_COMMUNITY): Payer: Self-pay | Admitting: Internal Medicine

## 2019-06-25 ENCOUNTER — Other Ambulatory Visit (HOSPITAL_COMMUNITY): Payer: Self-pay | Admitting: Internal Medicine

## 2019-08-07 ENCOUNTER — Other Ambulatory Visit (HOSPITAL_COMMUNITY): Payer: Self-pay | Admitting: Internal Medicine

## 2019-09-08 ENCOUNTER — Other Ambulatory Visit (HOSPITAL_COMMUNITY): Payer: Self-pay | Admitting: Internal Medicine

## 2019-09-26 ENCOUNTER — Other Ambulatory Visit (HOSPITAL_COMMUNITY): Payer: Self-pay

## 2019-09-26 MED ORDER — FUROSEMIDE 40 MG PO TABS: 40.0000 mg | ORAL_TABLET | Freq: Every day | ORAL | 6 refills | Status: DC

## 2019-10-07 ENCOUNTER — Other Ambulatory Visit (HOSPITAL_COMMUNITY): Payer: Self-pay | Admitting: Internal Medicine

## 2019-11-18 ENCOUNTER — Other Ambulatory Visit (HOSPITAL_COMMUNITY): Payer: Self-pay | Admitting: Internal Medicine

## 2019-11-22 ENCOUNTER — Other Ambulatory Visit (HOSPITAL_COMMUNITY): Payer: Self-pay | Admitting: Internal Medicine

## 2020-01-09 ENCOUNTER — Ambulatory Visit: Payer: Medicaid Other | Attending: Internal Medicine

## 2020-04-25 ENCOUNTER — Other Ambulatory Visit: Payer: Self-pay

## 2020-04-25 ENCOUNTER — Encounter (HOSPITAL_COMMUNITY): Payer: Self-pay | Admitting: Emergency Medicine

## 2020-04-25 ENCOUNTER — Emergency Department (HOSPITAL_COMMUNITY)
Admission: EM | Admit: 2020-04-25 | Discharge: 2020-04-25 | Disposition: A | Discharge status: Home / Self Care | Payer: Medicaid Other | Attending: Emergency Medicine | Admitting: Emergency Medicine

## 2020-04-25 DIAGNOSIS — Z79899 Other long term (current) drug therapy: Secondary | ICD-10-CM | POA: Insufficient documentation

## 2020-04-25 DIAGNOSIS — Z7982 Long term (current) use of aspirin: Secondary | ICD-10-CM | POA: Diagnosis not present

## 2020-04-25 DIAGNOSIS — I5042 Chronic combined systolic (congestive) and diastolic (congestive) heart failure: Secondary | ICD-10-CM | POA: Insufficient documentation

## 2020-04-25 DIAGNOSIS — R0981 Nasal congestion: Secondary | ICD-10-CM | POA: Diagnosis present

## 2020-04-25 DIAGNOSIS — I110 Hypertensive heart disease with heart failure: Secondary | ICD-10-CM | POA: Insufficient documentation

## 2020-04-25 DIAGNOSIS — Z8673 Personal history of transient ischemic attack (TIA), and cerebral infarction without residual deficits: Secondary | ICD-10-CM | POA: Diagnosis not present

## 2020-04-25 DIAGNOSIS — Z951 Presence of aortocoronary bypass graft: Secondary | ICD-10-CM | POA: Diagnosis not present

## 2020-04-25 DIAGNOSIS — F17210 Nicotine dependence, cigarettes, uncomplicated: Secondary | ICD-10-CM | POA: Diagnosis not present

## 2020-04-25 DIAGNOSIS — I2510 Atherosclerotic heart disease of native coronary artery without angina pectoris: Secondary | ICD-10-CM | POA: Insufficient documentation

## 2020-04-25 MED ORDER — AMOXICILLIN-POT CLAVULANATE 875-125 MG PO TABS: 1.0000 | ORAL_TABLET | Freq: Two times a day (BID) | ORAL | 0 refills | Status: AC

## 2020-04-25 NOTE — Progress Notes (Signed)
Christina Morrison J. [name], RN, BSN, [place] [phone]  RNCM set up appointment with Beckey Downing, NP on 5/18 @9 :00.  Spoke with pt at bedside and advised to please arrive 15 min early and take a picture ID and your current medications.  Pt verbalizes understanding of keeping appointment.

## 2020-04-25 NOTE — ED Triage Notes (Signed)
Pt to triage via [facility].  Reports hx of deviated septum.  C/o nasal congestion and nose pain x 2 days that increased since 7:30am today.

## 2020-04-25 NOTE — Discharge Instructions (Addendum)
Schedule to see the ENT physician for recheck

## 2020-04-25 NOTE — ED Provider Notes (Signed)
[facility] EMERGENCY DEPARTMENT Provider Note   CSN: [id number] Arrival date & time: 04/25/20  1036     History Chief Complaint  Patient presents with  . Nasal Congestion    Christina Morrison is a 44 y.o. female.  Pt complains of having a sore inside her nose.  Pt has a history of a deviated septum.  Pt reports she has had drainage from the area.  Pt has chronic nasal problems.   The history is provided by the patient. No language interpreter was used.       Past Medical History:  Diagnosis Date  . Anxiety   . Bipolar 1 disorder (HCC)   . Cardiac arrest (HCC) 11/03/2016  . Cardiogenic shock (HCC) 11/03/2016  . Chronic pain   . Cocaine use   . Coronary artery disease    a. 03/2015 NSTEMI/PCI in setting of cocaine use - BMS to diagonal. b. NSTEMI 12/2015 s/o overlapping DES to Cx, residual mild RCA and LAD disease, 75% OM1.  . Depression   . Dyslipidemia   . Migraine   . Osteoarthritis   . Polysubstance abuse (HCC)    a. tobacco/cocaine  . 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 Scoliosis   . TIA (transient ischemic attack)     Patient Active Problem List   Diagnosis Date Noted  . Chronic combined systolic and diastolic CHF (congestive heart failure) (HCC) 11/26/2016  . 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 Cardiac arrest (HCC) 11/03/2016  . Cardiogenic shock (HCC) 11/03/2016  . Chronic narcotic use 11/02/2016  . Depression 11/01/2016  . Hypertension 10/31/2016  . Chest pain 10/30/2016  . Dyslipidemia 04/11/2015  . Unstable angina (HCC) 04/11/2015  . CAD S/P percutaneous coronary angioplasty 03/23/2015  . Cocaine abuse (HCC) 03/23/2015  . Chronic lower back pain 01/24/2015  . Family history of diabetes mellitus (DM) 01/24/2015  . Anxiety and depression 01/24/2015  . Tobacco use disorder 01/24/2015  . IUD (intrauterine device) in place 01/24/2015    Past Surgical History:  Procedure Laterality Date    . CARDIAC CATHETERIZATION  03/21/2015   Procedure: CORONARY STENT INTERVENTION;  Surgeon: [name], MD;  Location: [facility] CATH LAB;  Service: Cardiovascular;;  diag bms 2.75 x 12 vision  . CARDIAC CATHETERIZATION N/A 01/14/2016   Procedure: Coronary Stent Intervention;  Surgeon: [name], MD;  Location: [facility] INVASIVE CV LAB;  Service: Cardiovascular;  Laterality: N/A;  . CARDIAC CATHETERIZATION N/A 10/31/2016   Procedure: Left Heart Cath and Coronary Angiography;  Surgeon: Yvonne Kendall, MD;  Location: [facility] INVASIVE CV LAB;  Service: Cardiovascular;  Laterality: N/A;  . CARDIAC CATHETERIZATION N/A 10/31/2016   Procedure: Intravascular Ultrasound/IVUS;  Surgeon: Yvonne Kendall, MD;  Location: [facility] INVASIVE CV LAB;  Service: Cardiovascular;  Laterality: N/A;  . CORONARY ARTERY BYPASS GRAFT N/A 11/03/2016   Procedure: CORONARY ARTERY BYPASS GRAFTING (CABG) x  three, using left internal mammary artery and right leg greater saphenous vein harvested endoscopically;  Surgeon: Purcell Nails, MD;  Location: [facility] OR;  Service: Open Heart Surgery;  Laterality: N/A;  . CORONARY ARTERY BYPASS GRAFT N/A 11/03/2016   Procedure: EMERGENCY REDO STERNOTOMY IN ICU, REDO CABG X 1, PLACEMENT OF BILATERAL VAD, PLACEMENT OF LEFT FEMORAL ARTERIAL LINE;  Surgeon: Purcell Nails, MD;  Location: [facility] OR;  Service: Open Heart Surgery;  Laterality: N/A;  . CORONARY STENT PLACEMENT  03/21/2015   first diagonal  .  ESOPHAGOGASTRODUODENOSCOPY (EGD) WITH PROPOFOL N/A 06/11/2015   Procedure: ESOPHAGOGASTRODUODENOSCOPY (EGD) WITH PROPOFOL;  Surgeon: Bernette Redbird, MD;  Location: [facility] ENDOSCOPY;  Service: Endoscopy;  Laterality: N/A;  . HEMATOMA EVACUATION Left 11/07/2016   Procedure: EVACUATION HEMATOMA;  Surgeon: Kerin Perna, MD;  Location: [facility] OR;  Service: Thoracic;  Laterality: Left;  . LEFT HEART CATHETERIZATION WITH CORONARY ANGIOGRAM N/A 03/21/2015   Procedure: LEFT HEART CATHETERIZATION WITH CORONARY ANGIOGRAM;  Surgeon:  [name], MD;  Location: [facility] CATH LAB;  Service: Cardiovascular;  Laterality: N/A;  . LEFT HEART CATHETERIZATION WITH CORONARY ANGIOGRAM N/A 04/11/2015   Procedure: LEFT HEART CATHETERIZATION WITH CORONARY ANGIOGRAM;  Surgeon: Kathleene Hazel, MD;  Location: [facility] CATH LAB;  Service: Cardiovascular;  Laterality: N/A;  . REMOVAL OF CENTRIMAG VENTRICULAR ASSIST DEVICE N/A 11/07/2016   Procedure: REMOVAL OF CENTRIMAG VENTRICULAR ASSIST DEVICE;  Surgeon: Kerin Perna, MD;  Location: [facility] OR;  Service: Open Heart Surgery;  Laterality: N/A;  . STERNAL CLOSURE N/A 11/07/2016   Procedure: STERNAL WASHOUT WITH STERNAL CLOSURE;  Surgeon: Kerin Perna, MD;  Location: [facility] OR;  Service: Thoracic;  Laterality: N/A;  . TEE WITHOUT CARDIOVERSION N/A 11/03/2016   Procedure: TRANSESOPHAGEAL ECHOCARDIOGRAM (TEE);  Surgeon: Purcell Nails, MD;  Location: [facility] OR;  Service: Open Heart Surgery;  Laterality: N/A;  . TEE WITHOUT CARDIOVERSION N/A 11/07/2016   Procedure: TRANSESOPHAGEAL ECHOCARDIOGRAM (TEE);  Surgeon: Kerin Perna, MD;  Location: [facility] OR;  Service: Thoracic;  Laterality: N/A;     OB History    Gravida  2   Para  2   Term  2   Preterm  0   AB  0   Living  2     SAB  0   TAB  0   Ectopic  0   Multiple  0   Live Births              Family History  Problem Relation Age of Onset  . Mental retardation Mother   . Hypertension Mother   . Hyperlipidemia Mother   . Heart disease Mother   . Depression Mother   . Hypertension Father   . Hyperlipidemia Father   . Heart disease Father   . Mental retardation Father   . Diabetes Father   . Cancer Maternal Aunt   . Stroke Maternal Grandmother     Social History   Tobacco Use  . Smoking status: Current Some Day Smoker    Packs/day: 0.10    Types: Cigarettes  . Smokeless tobacco: Never Used  Substance Use Topics  . Alcohol use: No    Alcohol/week: 0.0 standard drinks  . Drug use: No    Types: Cocaine    Comment:  Prior cocaine use    Home Medications Prior to Admission medications   Medication Sig Start Date End Date Taking? Authorizing Provider  acetaminophen (TYLENOL) 325 MG tablet Take 2 tablets (650 mg total) by mouth every 6 (six) hours as needed for mild pain or headache. 11/17/16   Ardelle Balls, PA-C  amoxicillin-clavulanate (AUGMENTIN) 875-125 MG tablet Take 1 tablet by mouth 2 (two) times daily for 10 days. 04/25/20 05/05/20  Elson Areas, PA-C  aspirin EC 81 MG EC tablet Take 1 tablet (81 mg total) by mouth daily. 03/23/15   Creig Hines, NP  atomoxetine (STRATTERA) 60 MG capsule TAKE 1 BY MOUTH EVERY MORNING 11/24/17   [provider]  atorvastatin (LIPITOR) 80 MG tablet TAKE 1 TABLET BY MOUTH  EVERY DAY 09/13/18   Bensimhon, Bevelyn Buckles, MD  buPROPion (WELLBUTRIN XL) 300 MG 24 hr tablet Take 300 mg by mouth every morning. 10/20/16   [provider]  clobetasol ointment (TEMOVATE) 0.05 % APPLY TO AFFECTED AREA EXTERNALLY TWICE DAILY 12/10/17   [provider]  doxepin (SINEQUAN) 25 MG capsule Take 25 mg by mouth at bedtime. 11/22/17   [provider]  escitalopram (LEXAPRO) 20 MG tablet Take 20 mg by mouth daily. 01/18/16   [provider]  furosemide (LASIX) 40 MG tablet Take 1 tablet (40 mg total) by mouth daily. Take extra tab as needed 09/26/19   Bensimhon, Bevelyn Buckles, MD  metoprolol tartrate (LOPRESSOR) 25 MG tablet TAKE 1/2 TABLET BY MOUTH TWICE A DAY *NEED APPT FOR REFILLS [phone]* 10/10/19   Bensimhon, Bevelyn Buckles, MD  ondansetron (ZOFRAN-ODT) 4 MG disintegrating tablet Take 4 mg by mouth daily as needed for nausea or vomiting. DISSOLVE 10/19/16   [provider]  OXcarbazepine (TRILEPTAL) 150 MG tablet TAKE 2 TABLETS IN THE MORNING,1 TABLET MIDDAY,AND 2 TABLETS AT BEDTIME 12/10/17   [provider]  Oxycodone HCl 10 MG TABS TAKE 1 TABLET 3 TIMES A DAY AS NEEDED FOR 30 DAYS 12/09/17   [provider]    pantoprazole (PROTONIX) 40 MG tablet PLEASE SEE ATTACHED FOR DETAILED DIRECTIONS 11/24/17   [name], MD    Allergies    Ranexa [ranolazine]  Review of Systems   Review of Systems  All other systems reviewed and are negative.   Physical Exam Updated Vital Signs BP 129/90 (BP Location: Right Arm)   Pulse 82   Temp 98.7 F (37.1 C) (Oral)   Resp 16   SpO2 100%   Physical Exam Vitals and nursing note reviewed.  Constitutional:      Appearance: She is well-developed.  HENT:     Head: Normocephalic.     Nose: Congestion present.     Comments: Narrow nares, dark/scabbed area right nostril,     Mouth/Throat:     Mouth: Mucous membranes are moist.  Cardiovascular:     Rate and Rhythm: Normal rate.  Pulmonary:     Effort: Pulmonary effort is normal.  Abdominal:     General: There is no distension.  Musculoskeletal:        General: Normal range of motion.     Cervical back: Normal range of motion.  Neurological:     Mental Status: She is alert and oriented to person, place, and time.     ED Results / Procedures / Treatments   Labs (all labs ordered are listed, but only abnormal results are displayed) Labs Reviewed - No data to display  EKG None  Radiology No results found.  Procedures Procedures (including critical care time)  Medications Ordered in ED Medications - No data to display  ED Course  I have reviewed the triage vital signs and the nursing notes.  Pertinent labs & imaging results that were available during my care of the patient were reviewed by me and considered in my medical decision making (see chart for details).    MDM Rules/Calculators/A&P                      MDM  I will treat pt with augmentin for infection.  Pt given number for follow up with Dr. Annalee Genta.  Pt has a history of multiple chronic illnesses and her primary MD is Out.  Care mangement talked with pt and set  up appointment for ongoing care.  Final Clinical  Impression(s) / ED Diagnoses Final diagnoses:  Nasal congestion    Rx / DC Orders ED Discharge Orders         Ordered    amoxicillin-clavulanate (AUGMENTIN) 875-125 MG tablet  2 times daily     04/25/20 1147        An After Visit Summary was printed and given to the patient.    Fransico Meadow, Hershal Coria 04/25/20 1249    Isla Pence, MD 04/25/20 1559

## 2020-05-03 DIAGNOSIS — J3489 Other specified disorders of nose and nasal sinuses: Secondary | ICD-10-CM | POA: Insufficient documentation

## 2020-05-03 DIAGNOSIS — J310 Chronic rhinitis: Secondary | ICD-10-CM | POA: Insufficient documentation

## 2020-08-07 ENCOUNTER — Other Ambulatory Visit (HOSPITAL_COMMUNITY)
Admission: RE | Admit: 2020-08-07 | Discharge: 2020-08-07 | Disposition: A | Discharge status: Home / Self Care | Payer: Medicaid Other | Source: Ambulatory Visit | Attending: Otolaryngology | Admitting: Otolaryngology

## 2020-08-07 ENCOUNTER — Encounter (HOSPITAL_COMMUNITY): Payer: Self-pay | Admitting: Anesthesiology

## 2020-08-07 ENCOUNTER — Encounter (HOSPITAL_COMMUNITY): Payer: Self-pay | Admitting: Otolaryngology

## 2020-08-07 DIAGNOSIS — U071 COVID-19: Secondary | ICD-10-CM | POA: Insufficient documentation

## 2020-08-07 DIAGNOSIS — Z01812 Encounter for preprocedural laboratory examination: Secondary | ICD-10-CM | POA: Diagnosis not present

## 2020-08-07 LAB — SARS CORONAVIRUS 2 (TAT 6-24 HRS): SARS Coronavirus 2: POSITIVE — AB

## 2020-08-07 NOTE — Anesthesia Preprocedure Evaluation (Deleted)
Anesthesia Evaluation    Airway        Dental   Pulmonary Current Smoker,           Cardiovascular hypertension,      Neuro/Psych    GI/Hepatic   Endo/Other    Renal/GU      Musculoskeletal   Abdominal   Peds  Hematology   Anesthesia Other Findings   Reproductive/Obstetrics                             Anesthesia Physical Anesthesia Plan  ASA:   Anesthesia Plan:    Post-op Pain Management:    Induction:   PONV Risk Score and Plan:   Airway Management Planned:   Additional Equipment:   Intra-op Plan:   Post-operative Plan:   Informed Consent:   Plan Discussed with:   Anesthesia Plan Comments: (History of HTN, CAD and tobacco use. Pt underwent stenting of LCX in 1/17. Readmitted for NSTEMI. Found to have 2v CAD with high-grade ostial LAD and LCX stensosis. Pt underwent CABG 11/03/16 with Dr. Cornelius Moras with LIMA to LAD, SVG to DIAG and SVG to LCX. Developed VF arrest and had re-do sternotomy begun at bedside with cardiac massage. 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 Underwent re-do CABG x1 with SVG to OM and placement of biVAD support due to shock.  Hospital course additionally complicated by large hematoma with skin necrosis of LLE from open EVH harvest. Treated with ABX and wound care in hospital.   Cardiac MRI 09/03/2017 showed LVEF 57%, right ventricle normal. Last seen by cardiology 03/17/2018 and at that time was having no signs or symptoms of ischemia.  Shee was advised to follow-up in 6 months however has not followed up.  Case reviewed with Dr. Aleene Davidson and Dr. Hyacinth Meeker, both agreed not a candidate for surgery center would be better served at [facility].  Will need day of surgery labs, eval, and EKG.  Cardiac MRI 09/02/2017: IMPRESSION: 1. Normal LV size with EF 57%. Wall motion abnormalities as noted above. The true apex was akinetic and tethered to the  overlying pericardium. No definite LV apical thrombus noted.  2.  Normal RV size and systolic function.  3.  LGE pattern as above.  This is a coronary disease distribution.)        Anesthesia Quick Evaluation
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
## 2020-08-07 NOTE — Progress Notes (Signed)
Anesthesia Chart Review: Same-day work-up  History of HTN, CAD and tobacco use. Pt underwent stenting of LCX in 1/17. Readmitted for NSTEMI. Found to have 2v CAD with high-grade ostial LAD and LCX stensosis. Pt underwent CABG 11/03/16 with Dr. Cornelius Moras with LIMA to LAD, SVG to DIAG and SVG to LCX. Developed VF arrest and had re-do sternotomy begun at bedside with cardiac massage. 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 Underwent re-do CABG x1 with SVG to OM and placement of biVAD support due to shock.  Hospital course additionally complicated by large hematoma with skin necrosis of LLE from open EVH harvest. Treated with ABX and wound care in hospital.   Cardiac MRI 09/03/2017 showed LVEF 57%, right ventricle normal. Last seen by cardiology 03/17/2018 and at that time was having no signs or symptoms of ischemia.  Shee was advised to follow-up in 6 months however has not followed up.  Case reviewed with Dr. Aleene Davidson and Dr. Hyacinth Meeker, both agreed not a candidate for surgery center would be better served at [facility].  Will need day of surgery labs, eval, and EKG.  Cardiac MRI 09/02/2017: IMPRESSION: 1. Normal LV size with EF 57%. Wall motion abnormalities as noted above. The true apex was akinetic and tethered to the overlying pericardium. No definite LV apical thrombus noted.  2.  Normal RV size and systolic function.  3.  LGE pattern as above.  This is a coronary disease distribution.   Zannie Cove [facility] Short Stay Center/Anesthesiology Phone [phone] 08/07/2020 3:30 PM

## 2020-08-07 NOTE — Progress Notes (Signed)
PCP - Dr Pearson Grippe Cardiologist - Dr Gala Romney  Therapy - Dr Tamela Oddi  Chest x-ray - n/a EKG - DOs 08/08/20 Stress Test - 03/12/16 ECHO - 08/11/17 Cardiac Cath - 10/31/16  Aspirin Instructions: Follow your surgeon's instructions on when to stop aspirin prior to surgery,  If no instructions were given by your surgeon then you will need to call the office for those instructions.  Anesthesia review: Yes  STOP now taking any Aspirin (unless otherwise instructed by your surgeon), Aleve, Naproxen, Ibuprofen, Motrin, Advil, Goody's, BC's, all herbal medications, fish oil, and all vitamins.   Coronavirus Screening Covid test scheduled on 08/07/20 Do you have any of the following symptoms:  Cough yes/no: No Fever (>100.70F)  yes/no: No Runny nose yes/no: No Sore throat yes/no: No Difficulty breathing/shortness of breath  yes/no: No  Have you traveled in the last 14 days and where? yes/no: No  Patient verbalized understanding of instructions that were given via phone.
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
## 2020-08-08 ENCOUNTER — Ambulatory Visit (HOSPITAL_COMMUNITY): Admission: RE | Admit: 2020-08-08 | Payer: Medicaid Other | Source: Home / Self Care | Admitting: Otolaryngology

## 2020-08-08 HISTORY — DX: Essential (primary) hypertension: I10

## 2020-08-08 HISTORY — DX: Attention-deficit hyperactivity disorder, unspecified type: F90.9

## 2020-08-08 HISTORY — DX: Gastro-esophageal reflux disease without esophagitis: K21.9

## 2020-08-08 HISTORY — DX: Hyperlipidemia, unspecified: E78.5

## 2020-08-08 HISTORY — DX: Borderline personality disorder: F60.3

## 2020-08-08 SURGERY — EXAM UNDER ANESTHESIA
Anesthesia: General | Laterality: Bilateral

## 2020-08-08 NOTE — Progress Notes (Signed)
Patient with positive covid result. Contacted MD and informed of result.   

## 2020-08-22 NOTE — Progress Notes (Signed)
Patient was covid positive 08/07/20, patient does not need to be retested within 90 days of her procedure.  The procedure is scheduled for 08/27/20

## 2020-08-23 ENCOUNTER — Other Ambulatory Visit (HOSPITAL_COMMUNITY): Payer: Medicaid Other

## 2020-08-24 ENCOUNTER — Other Ambulatory Visit: Payer: Self-pay

## 2020-08-24 ENCOUNTER — Encounter (HOSPITAL_COMMUNITY): Payer: Self-pay | Admitting: Otolaryngology

## 2020-08-24 NOTE — Progress Notes (Signed)
Spoke with pt for pre-op call. Pt was originally scheduled for surgery end of August, she states she rescheduled the surgery herself. She states she did not find out that she tested positive for Covid until 2 days ago. She states she had no symptoms. Pt was not retested.  Pt denies any chest pain or sob. See Antionette Poles' note on 08/07/20.

## 2020-08-27 ENCOUNTER — Encounter (HOSPITAL_COMMUNITY)
Admission: RE | Disposition: A | Discharge status: Home / Self Care | Payer: Self-pay | Source: Home / Self Care | Attending: Otolaryngology

## 2020-08-27 ENCOUNTER — Ambulatory Visit (HOSPITAL_COMMUNITY): Payer: Medicaid Other | Admitting: Certified Registered Nurse Anesthetist

## 2020-08-27 ENCOUNTER — Other Ambulatory Visit: Payer: Self-pay

## 2020-08-27 ENCOUNTER — Encounter (HOSPITAL_COMMUNITY): Payer: Self-pay | Admitting: Otolaryngology

## 2020-08-27 ENCOUNTER — Ambulatory Visit (HOSPITAL_COMMUNITY)
Admission: RE | Admit: 2020-08-27 | Discharge: 2020-08-27 | Disposition: A | Discharge status: Home / Self Care | Payer: Medicaid Other | Attending: Otolaryngology | Admitting: Otolaryngology

## 2020-08-27 DIAGNOSIS — Z79899 Other long term (current) drug therapy: Secondary | ICD-10-CM | POA: Insufficient documentation

## 2020-08-27 DIAGNOSIS — J342 Deviated nasal septum: Secondary | ICD-10-CM | POA: Diagnosis not present

## 2020-08-27 DIAGNOSIS — J3489 Other specified disorders of nose and nasal sinuses: Secondary | ICD-10-CM | POA: Diagnosis present

## 2020-08-27 DIAGNOSIS — I110 Hypertensive heart disease with heart failure: Secondary | ICD-10-CM | POA: Insufficient documentation

## 2020-08-27 DIAGNOSIS — Z8616 Personal history of COVID-19: Secondary | ICD-10-CM | POA: Diagnosis not present

## 2020-08-27 DIAGNOSIS — F17200 Nicotine dependence, unspecified, uncomplicated: Secondary | ICD-10-CM | POA: Insufficient documentation

## 2020-08-27 DIAGNOSIS — M1990 Unspecified osteoarthritis, unspecified site: Secondary | ICD-10-CM | POA: Diagnosis not present

## 2020-08-27 DIAGNOSIS — F319 Bipolar disorder, unspecified: Secondary | ICD-10-CM | POA: Insufficient documentation

## 2020-08-27 DIAGNOSIS — I2510 Atherosclerotic heart disease of native coronary artery without angina pectoris: Secondary | ICD-10-CM | POA: Diagnosis not present

## 2020-08-27 DIAGNOSIS — I509 Heart failure, unspecified: Secondary | ICD-10-CM | POA: Diagnosis not present

## 2020-08-27 DIAGNOSIS — Z951 Presence of aortocoronary bypass graft: Secondary | ICD-10-CM | POA: Insufficient documentation

## 2020-08-27 DIAGNOSIS — Z8673 Personal history of transient ischemic attack (TIA), and cerebral infarction without residual deficits: Secondary | ICD-10-CM | POA: Diagnosis not present

## 2020-08-27 HISTORY — DX: COVID-19: U07.1

## 2020-08-27 LAB — BASIC METABOLIC PANEL
Anion gap: 9 (ref 5–15)
BUN: 8 mg/dL (ref 6–20)
CO2: 27 mmol/L (ref 22–32)
Calcium: 9.3 mg/dL (ref 8.9–10.3)
Chloride: 102 mmol/L (ref 98–111)
Creatinine, Ser: 0.81 mg/dL (ref 0.44–1.00)
GFR calc Af Amer: >60 mL/min (ref >60)
GFR calc non Af Amer: >60 mL/min (ref >60)
Glucose, Bld: 88 mg/dL (ref 70–99)
Potassium: 3.7 mmol/L (ref 3.5–5.1)
Sodium: 138 mmol/L (ref 135–145)

## 2020-08-27 LAB — CBC
HCT: 40.2 % (ref 36.0–46.0)
Hemoglobin: 13.4 g/dL (ref 12.0–15.0)
MCH: 28.9 pg (ref 26.0–34.0)
MCHC: 33.3 g/dL (ref 30.0–36.0)
MCV: 86.8 fL (ref 80.0–100.0)
Platelets: 225 10*3/uL (ref 150–400)
RBC: 4.63 MIL/uL (ref 3.87–5.11)
RDW: 11.9 % (ref 11.5–15.5)
WBC: 5.7 10*3/uL (ref 4.0–10.5)
nRBC: 0 % (ref 0.0–0.2)

## 2020-08-27 LAB — HCG, SERUM, QUALITATIVE: Preg, Serum: NEGATIVE

## 2020-08-27 SURGERY — EXAM UNDER ANESTHESIA
Anesthesia: General | Site: Nose

## 2020-08-27 MED ORDER — ACETAMINOPHEN 10 MG/ML IV SOLN
INTRAVENOUS | Status: AC
  Filled 2020-08-27: qty 100

## 2020-08-27 MED ORDER — BACITRACIN ZINC 500 UNIT/GM EX OINT
TOPICAL_OINTMENT | CUTANEOUS | Status: DC | PRN
  Administered 2020-08-27: 1 via TOPICAL

## 2020-08-27 MED ORDER — PROPOFOL 10 MG/ML IV BOLUS
INTRAVENOUS | Status: AC
  Filled 2020-08-27: qty 20

## 2020-08-27 MED ORDER — OXYCODONE HCL 5 MG PO TABS
ORAL_TABLET | ORAL | Status: DC
Start: 2020-08-27 — End: 2020-08-27
  Filled 2020-08-27: qty 1

## 2020-08-27 MED ORDER — OXYCODONE HCL 5 MG PO TABS
5.0000 mg | ORAL_TABLET | Freq: Once | ORAL | Status: AC | PRN
  Administered 2020-08-27: 5 mg via ORAL

## 2020-08-27 MED ORDER — LIDOCAINE-EPINEPHRINE 1 %-1:100000 IJ SOLN
INTRAMUSCULAR | Status: AC
  Filled 2020-08-27: qty 1

## 2020-08-27 MED ORDER — OXYMETAZOLINE HCL 0.05 % NA SOLN
NASAL | Status: DC | PRN
  Administered 2020-08-27 (×3): 2 via NASAL

## 2020-08-27 MED ORDER — LIDOCAINE-EPINEPHRINE 1 %-1:100000 IJ SOLN
INTRAMUSCULAR | Status: DC | PRN
  Administered 2020-08-27: 10 mL

## 2020-08-27 MED ORDER — OXYMETAZOLINE HCL 0.05 % NA SOLN
NASAL | Status: DC | PRN
  Administered 2020-08-27: 1 via TOPICAL

## 2020-08-27 MED ORDER — ONDANSETRON HCL 4 MG/2ML IJ SOLN
INTRAMUSCULAR | Status: DC | PRN
  Administered 2020-08-27: 4 mg via INTRAVENOUS

## 2020-08-27 MED ORDER — ACETAMINOPHEN 500 MG PO TABS: 1000.0000 mg | ORAL_TABLET | Freq: Once | ORAL | Status: DC | PRN

## 2020-08-27 MED ORDER — ACETAMINOPHEN 10 MG/ML IV SOLN
1000.0000 mg | Freq: Once | INTRAVENOUS | Status: DC | PRN
  Administered 2020-08-27: 1000 mg via INTRAVENOUS

## 2020-08-27 MED ORDER — DEXAMETHASONE SODIUM PHOSPHATE 10 MG/ML IJ SOLN
INTRAMUSCULAR | Status: DC | PRN
  Administered 2020-08-27: 5 mg via INTRAVENOUS

## 2020-08-27 MED ORDER — 0.9 % SODIUM CHLORIDE (POUR BTL) OPTIME
TOPICAL | Status: DC | PRN
  Administered 2020-08-27: 1000 mL

## 2020-08-27 MED ORDER — FENTANYL CITRATE (PF) 250 MCG/5ML IJ SOLN
INTRAMUSCULAR | Status: AC
  Filled 2020-08-27: qty 5

## 2020-08-27 MED ORDER — ORAL CARE MOUTH RINSE: 15.0000 mL | Freq: Once | OROMUCOSAL | Status: DC

## 2020-08-27 MED ORDER — LACTATED RINGERS IV SOLN: INTRAVENOUS | Status: DC

## 2020-08-27 MED ORDER — LIDOCAINE 2% (20 MG/ML) 5 ML SYRINGE
INTRAMUSCULAR | Status: DC | PRN
  Administered 2020-08-27: 60 mg via INTRAVENOUS

## 2020-08-27 MED ORDER — DEXAMETHASONE SODIUM PHOSPHATE 10 MG/ML IJ SOLN
INTRAMUSCULAR | Status: AC
  Filled 2020-08-27: qty 1

## 2020-08-27 MED ORDER — FENTANYL CITRATE (PF) 250 MCG/5ML IJ SOLN
INTRAMUSCULAR | Status: DC | PRN
Start: 2020-08-27 — End: 2020-08-27
  Administered 2020-08-27 (×2): 50 ug via INTRAVENOUS

## 2020-08-27 MED ORDER — CHLORHEXIDINE GLUCONATE 0.12 % MT SOLN
15.0000 mL | Freq: Once | OROMUCOSAL | Status: DC
  Filled 2020-08-27: qty 15

## 2020-08-27 MED ORDER — ACETAMINOPHEN 160 MG/5ML PO SOLN: 1000.0000 mg | Freq: Once | ORAL | Status: DC | PRN

## 2020-08-27 MED ORDER — OXYCODONE HCL 5 MG/5ML PO SOLN: 5.0000 mg | Freq: Once | ORAL | Status: AC | PRN

## 2020-08-27 MED ORDER — BACITRACIN ZINC 500 UNIT/GM EX OINT
TOPICAL_OINTMENT | CUTANEOUS | Status: AC
  Filled 2020-08-27: qty 28.35

## 2020-08-27 MED ORDER — OXYMETAZOLINE HCL 0.05 % NA SOLN
NASAL | Status: AC
  Filled 2020-08-27: qty 30

## 2020-08-27 MED ORDER — MIDAZOLAM HCL 2 MG/2ML IJ SOLN
INTRAMUSCULAR | Status: AC
  Filled 2020-08-27: qty 2

## 2020-08-27 MED ORDER — LIDOCAINE 2% (20 MG/ML) 5 ML SYRINGE
INTRAMUSCULAR | Status: AC
  Filled 2020-08-27: qty 5

## 2020-08-27 MED ORDER — PROPOFOL 10 MG/ML IV BOLUS
INTRAVENOUS | Status: DC | PRN
  Administered 2020-08-27: 50 mg via INTRAVENOUS
  Administered 2020-08-27: 200 mg via INTRAVENOUS

## 2020-08-27 MED ORDER — ORAL CARE MOUTH RINSE: 15.0000 mL | Freq: Once | OROMUCOSAL | Status: AC

## 2020-08-27 MED ORDER — FENTANYL CITRATE (PF) 100 MCG/2ML IJ SOLN
25.0000 ug | INTRAMUSCULAR | Status: DC | PRN
  Administered 2020-08-27: 50 ug via INTRAVENOUS

## 2020-08-27 MED ORDER — MIDAZOLAM HCL 2 MG/2ML IJ SOLN
INTRAMUSCULAR | Status: DC | PRN
  Administered 2020-08-27: 2 mg via INTRAVENOUS

## 2020-08-27 MED ORDER — FENTANYL CITRATE (PF) 100 MCG/2ML IJ SOLN
INTRAMUSCULAR | Status: DC
Start: 2020-08-27 — End: 2020-08-27
  Filled 2020-08-27: qty 2

## 2020-08-27 MED ORDER — ONDANSETRON HCL 4 MG/2ML IJ SOLN
INTRAMUSCULAR | Status: AC
  Filled 2020-08-27: qty 2

## 2020-08-27 MED ORDER — CHLORHEXIDINE GLUCONATE 0.12 % MT SOLN
15.0000 mL | Freq: Once | OROMUCOSAL | Status: AC
  Administered 2020-08-27: 15 mL via OROMUCOSAL

## 2020-08-27 SURGICAL SUPPLY — 38 items
ATTRACTOMAT 16X20 MAGNETIC DRP (DRAPES) IMPLANT
BLADE RAD40 ROTATE 4M 4 5PK (BLADE) IMPLANT
BLADE RAD60 ROTATE M4 4 5PK (BLADE) IMPLANT
BLADE SURG 15 STRL LF DISP TIS (BLADE) IMPLANT
BLADE SURG 15 STRL SS (BLADE)
BLADE TRICUT ROTATE M4 4 5PK (BLADE) IMPLANT
CANISTER SUCT 3000ML PPV (MISCELLANEOUS) ×2 IMPLANT
COAGULATOR SUCT 8FR VV (MISCELLANEOUS) ×2 IMPLANT
COVER WAND RF STERILE (DRAPES) ×2 IMPLANT
DRAPE HALF SHEET 40X57 (DRAPES) IMPLANT
DRESSING NASAL KENNEDY 3.5X.9 (MISCELLANEOUS) IMPLANT
DRSG NASAL KENNEDY 3.5X.9 (MISCELLANEOUS)
DRSG NASOPORE 8CM (GAUZE/BANDAGES/DRESSINGS) IMPLANT
ELECT COATED BLADE 2.86 ST (ELECTRODE) ×2 IMPLANT
ELECT REM PT RETURN 9FT ADLT (ELECTROSURGICAL)
ELECTRODE REM PT RTRN 9FT ADLT (ELECTROSURGICAL) IMPLANT
FILTER ARTHROSCOPY CONVERTOR (FILTER) IMPLANT
GLOVE ECLIPSE 7.5 STRL STRAW (GLOVE) ×4 IMPLANT
GOWN STRL REUS W/ TWL LRG LVL3 (GOWN DISPOSABLE) ×2 IMPLANT
GOWN STRL REUS W/TWL LRG LVL3 (GOWN DISPOSABLE) ×4
KIT BASIN OR (CUSTOM PROCEDURE TRAY) ×2 IMPLANT
KIT TURNOVER KIT B (KITS) ×2 IMPLANT
NDL PRECISIONGLIDE 27X1.5 (NEEDLE) ×1 IMPLANT
NEEDLE PRECISIONGLIDE 27X1.5 (NEEDLE) ×2 IMPLANT
NS IRRIG 1000ML POUR BTL (IV SOLUTION) ×2 IMPLANT
PAD ARMBOARD 7.5X6 YLW CONV (MISCELLANEOUS) ×4 IMPLANT
PATTIES SURGICAL .5 X3 (DISPOSABLE) ×2 IMPLANT
PENCIL FOOT CONTROL (ELECTRODE) ×2 IMPLANT
SHEATH ENDOSCRUB 0 DEG (SHEATH) IMPLANT
SHEATH ENDOSCRUB 30 DEG (SHEATH) IMPLANT
SPECIMEN JAR SMALL (MISCELLANEOUS) ×2 IMPLANT
SWAB COLLECTION DEVICE MRSA (MISCELLANEOUS) IMPLANT
SWAB CULTURE ESWAB REG 1ML (MISCELLANEOUS) IMPLANT
SYR 50ML SLIP (SYRINGE) IMPLANT
TOWEL GREEN STERILE FF (TOWEL DISPOSABLE) ×2 IMPLANT
TRAY ENT MC OR (CUSTOM PROCEDURE TRAY) ×2 IMPLANT
TUBING EXTENTION W/L.L. (IV SETS) ×2 IMPLANT
WATER STERILE IRR 1000ML POUR (IV SOLUTION) ×2 IMPLANT

## 2020-08-27 NOTE — Anesthesia Postprocedure Evaluation (Signed)
Anesthesia Post Note  Patient: Christina Morrison  Procedure(s) Performed: EXAM UNDER ANESTHESIA, PLACEMENT OF NASAL SEPTAL BUTTON (N/A Nose)     Patient location during evaluation: PACU Anesthesia Type: General Level of consciousness: awake and alert Pain management: pain level controlled Vital Signs Assessment: post-procedure vital signs reviewed and stable Respiratory status: spontaneous breathing, nonlabored ventilation, respiratory function stable and patient connected to nasal cannula oxygen Cardiovascular status: blood pressure returned to baseline and stable Postop Assessment: no apparent nausea or vomiting Anesthetic complications: no   No complications documented.  Last Vitals:  Vitals:   08/27/20 0830 08/27/20 0850  BP: (!) 175/96 (!) 186/96  Pulse: 65 65  Resp: 14 16  Temp:    SpO2: 100% 100%    Last Pain:  Vitals:   08/27/20 0830  PainSc: 6                  Hades Mathew

## 2020-08-27 NOTE — Op Note (Signed)
OPERATIVE REPORT  DATE OF SURGERY: 08/27/2020  PATIENT:  Christina Morrison,  44 y.o. female  PRE-OPERATIVE DIAGNOSIS:  SEPTAL PERFORATION  POST-OPERATIVE DIAGNOSIS:  SEPTAL PERFORATION  PROCEDURE:  Procedure(s): EXAM UNDER ANESTHESIA, PLACEMENT OF NASAL SEPTAL BUTTON  SURGEON:  Susy Frizzle, MD  ASSISTANTS: None  ANESTHESIA:   General   EBL: 5 ml  DRAINS: None  LOCAL MEDICATIONS USED:  None  SPECIMEN:  none  COUNTS:  Correct  PROCEDURE DETAILS: The patient was taken to the operating room and placed on the operating table in the supine position. Following induction of general LMA anesthesia, the nose was inspected in scabs and crusts were removed and thick secretions were suctioned out.  The septal button was removed coated with bacitracin ointment and then replaced and placed into position with each leaf of the button on either side of the septum covering the perforation.  Patient was then awakened extubated and transferred to recovery in stable condition.    PATIENT DISPOSITION:  To PACU, stable

## 2020-08-27 NOTE — Anesthesia Preprocedure Evaluation (Signed)
Anesthesia Evaluation  Patient identified by MRN, date of birth, ID band Patient awake    Reviewed: Allergy & Precautions, NPO status , Patient's Chart, lab work & pertinent test results, reviewed documented beta blocker date and time   History of Anesthesia Complications Negative for: history of anesthetic complications  Airway Mallampati: II  TM Distance: >3 FB Neck ROM: Full    Dental  (+) Dental Advisory Given, Teeth Intact   Pulmonary neg shortness of breath, neg COPD, neg recent URI, Current SmokerPatient did not abstain from smoking.,  Covid-19 Nucleic Acid Test Results Lab Results      Component                Value               Date                      [id number]              POSITIVE (A)        08/07/2020              breath sounds clear to auscultation       Cardiovascular hypertension, Pt. on medications and Pt. on home beta blockers (-) angina+ CAD, + CABG and +CHF   Rhythm:Regular   - Left ventricle: The cavity size was normal. Wall thickness was  normal. Septal bounce consistent with prior cardiac surgery.  Inferior severe hypokinesis. Basal to mid inferolateral  hypokinesis. Systolic function was mildly reduced. The estimated  ejection fraction was in the range of 45% to 50%. Features are  consistent with a pseudonormal left ventricular filling pattern,  with concomitant abnormal relaxation and increased filling  pressure (grade 2 diastolic dysfunction).  - Aortic valve: There was no stenosis.  - Mitral valve: There was trivial regurgitation.  - Right ventricle: The cavity size was mildly dilated. Systolic  function was mildly reduced.  - Tricuspid valve: Peak RV-RA gradient (S): 23 mm Hg.  - Pulmonary arteries: PA peak pressure: 26 mm Hg (S).  - Inferior vena cava: The vessel was normal in size. The  respirophasic diameter changes were in the normal range (>= 50%),  consistent with  normal central venous pressure.    Neuro/Psych  Headaches, PSYCHIATRIC DISORDERS Anxiety Depression Bipolar Disorder TIA   GI/Hepatic Neg liver ROS, GERD  Medicated and Controlled,  Endo/Other  negative endocrine ROS  Renal/GU negative Renal ROSLab Results      Component                Value               Date                      CREATININE               0.90                03/17/2018             Lab Results      Component                Value               Date                      K  4.1                 03/17/2018                Musculoskeletal  (+) Arthritis ,   Abdominal   Peds  Hematology negative hematology ROS (+) Lab Results      Component                Value               Date                      WBC                      6.7                 01/07/2017                HGB                      14.2                01/07/2017                HCT                      42.4                01/07/2017                MCV                      92.2                01/07/2017                PLT                      229                 01/07/2017              Anesthesia Other Findings   Reproductive/Obstetrics                             Anesthesia Physical Anesthesia Plan  ASA: III  Anesthesia Plan: General   Post-op Pain Management:    Induction: Intravenous  PONV Risk Score and Plan: 2 and Ondansetron and Dexamethasone  Airway Management Planned: Mask  Additional Equipment: None  Intra-op Plan:   Post-operative Plan: Extubation in OR  Informed Consent: I have reviewed the patients History and Physical, chart, labs and discussed the procedure including the risks, benefits and alternatives for the proposed anesthesia with the patient or authorized representative who has indicated his/her understanding and acceptance.     Dental advisory given  Plan Discussed with: CRNA and Surgeon  Anesthesia Plan Comments:          Anesthesia Quick Evaluation

## 2020-08-27 NOTE — Transfer of Care (Signed)
Immediate Anesthesia Transfer of Care Note  Patient: Christina Morrison  Procedure(s) Performed: EXAM UNDER ANESTHESIA, PLACEMENT OF NASAL SEPTAL BUTTON (N/A Nose)  Patient Location: PACU  Anesthesia Type:General  Level of Consciousness: drowsy  Airway & Oxygen Therapy: Patient Spontanous Breathing and Patient connected to face mask oxygen  Post-op Assessment: Report given to RN and Post -op Vital signs reviewed and stable  Post vital signs: Reviewed and stable  Last Vitals:  Vitals Value Taken Time  BP    Temp    Pulse    Resp    SpO2      Last Pain:  Vitals:   08/27/20 0618  PainSc: 0-No pain      Patients Stated Pain Goal: 3 (08/27/20 0618)  Complications: No complications documented.

## 2020-08-27 NOTE — Discharge Instructions (Signed)
Use nasal saline spray multiple times daily.

## 2020-08-27 NOTE — Anesthesia Procedure Notes (Signed)
Procedure Name: LMA Insertion Date/Time: 08/27/2020 7:43 AM Performed by: Shary Decamp, CRNA Pre-anesthesia Checklist: Patient identified, Emergency Drugs available, Suction available and Patient being monitored Patient Re-evaluated:Patient Re-evaluated prior to induction Oxygen Delivery Method: Circle system utilized Preoxygenation: Pre-oxygenation with 100% oxygen Induction Type: IV induction Ventilation: Mask ventilation without difficulty LMA: LMA inserted LMA Size: 3.0 Number of attempts: 1 Airway Equipment and Method: Oral airway Placement Confirmation: positive ETCO2 and breath sounds checked- equal and bilateral Tube secured with: Tape Dental Injury: Teeth and Oropharynx as per pre-operative assessment

## 2020-08-27 NOTE — Interval H&P Note (Signed)
History and Physical Interval Note:  08/27/2020 7:20 AM  Christina Morrison  has presented today for surgery, with the diagnosis of SEPTAL PERFORATION.  The various methods of treatment have been discussed with the patient and family. After consideration of risks, benefits and other options for treatment, the patient has consented to  Procedure(s): EXAM UNDER ANESTHESIA to check septal button (N/A) as a surgical intervention.  The patient's history has been reviewed, patient examined, no change in status, stable for surgery.  I have reviewed the patient's chart and labs.  Questions were answered to the patient's satisfaction.     Serena Colonel

## 2020-10-09 DIAGNOSIS — K219 Gastro-esophageal reflux disease without esophagitis: Secondary | ICD-10-CM | POA: Insufficient documentation

## 2021-05-08 DIAGNOSIS — J3489 Other specified disorders of nose and nasal sinuses: Secondary | ICD-10-CM | POA: Diagnosis not present

## 2021-07-22 DIAGNOSIS — Z79899 Other long term (current) drug therapy: Secondary | ICD-10-CM | POA: Diagnosis not present

## 2021-07-22 DIAGNOSIS — F419 Anxiety disorder, unspecified: Secondary | ICD-10-CM | POA: Diagnosis not present

## 2021-07-22 DIAGNOSIS — F3161 Bipolar disorder, current episode mixed, mild: Secondary | ICD-10-CM | POA: Diagnosis not present

## 2021-07-23 DIAGNOSIS — Z79891 Long term (current) use of opiate analgesic: Secondary | ICD-10-CM | POA: Diagnosis not present

## 2021-08-21 DIAGNOSIS — J3489 Other specified disorders of nose and nasal sinuses: Secondary | ICD-10-CM | POA: Diagnosis not present
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
## 2021-09-03 ENCOUNTER — Ambulatory Visit: Payer: Self-pay

## 2021-09-03 ENCOUNTER — Other Ambulatory Visit: Payer: Self-pay

## 2021-09-03 ENCOUNTER — Ambulatory Visit (INDEPENDENT_AMBULATORY_CARE_PROVIDER_SITE_OTHER): Payer: Medicaid Other | Admitting: Internal Medicine

## 2021-09-03 ENCOUNTER — Encounter: Payer: Self-pay | Admitting: Internal Medicine

## 2021-09-03 VITALS — BP 128/86 | HR 83 | Ht 63.0 in | Wt 145.0 lb

## 2021-09-03 DIAGNOSIS — M79641 Pain in right hand: Secondary | ICD-10-CM | POA: Diagnosis not present

## 2021-09-03 DIAGNOSIS — F32A Depression, unspecified: Secondary | ICD-10-CM

## 2021-09-03 DIAGNOSIS — G8929 Other chronic pain: Secondary | ICD-10-CM | POA: Diagnosis not present

## 2021-09-03 DIAGNOSIS — M25571 Pain in right ankle and joints of right foot: Secondary | ICD-10-CM | POA: Diagnosis not present

## 2021-09-03 DIAGNOSIS — M329 Systemic lupus erythematosus, unspecified: Secondary | ICD-10-CM

## 2021-09-03 DIAGNOSIS — M79642 Pain in left hand: Secondary | ICD-10-CM

## 2021-09-03 DIAGNOSIS — J3489 Other specified disorders of nose and nasal sinuses: Secondary | ICD-10-CM

## 2021-09-03 DIAGNOSIS — F419 Anxiety disorder, unspecified: Secondary | ICD-10-CM

## 2021-09-03 DIAGNOSIS — M25561 Pain in right knee: Secondary | ICD-10-CM | POA: Diagnosis not present

## 2021-09-03 DIAGNOSIS — M25572 Pain in left ankle and joints of left foot: Secondary | ICD-10-CM

## 2021-09-03 DIAGNOSIS — M3219 Other organ or system involvement in systemic lupus erythematosus: Secondary | ICD-10-CM | POA: Insufficient documentation

## 2021-09-03 DIAGNOSIS — M25562 Pain in left knee: Secondary | ICD-10-CM | POA: Diagnosis not present

## 2021-09-10 LAB — SJOGRENS SYNDROME-B EXTRACTABLE NUCLEAR ANTIBODY: SSB (La) (ENA) Antibody, IgG: 2.2 AI — AB

## 2021-09-10 LAB — ANTI-SCLERODERMA ANTIBODY: Scleroderma (Scl-70) (ENA) Antibody, IgG: <1 AI

## 2021-09-10 LAB — BETA-2 GLYCOPROTEIN ANTIBODIES
Beta-2 Glyco 1 IgA: 34.3 U/mL — ABNORMAL HIGH
Beta-2 Glyco 1 IgM: >112 U/mL — ABNORMAL HIGH
Beta-2 Glyco I IgG: 21.6 U/mL — ABNORMAL HIGH

## 2021-09-10 LAB — ANTI-NUCLEAR AB-TITER (ANA TITER): ANA Titer 1: 1:320 {titer} — ABNORMAL HIGH

## 2021-09-10 LAB — ANCA SCREEN W REFLEX TITER

## 2021-09-10 LAB — C-REACTIVE PROTEIN: CRP: 1.8 mg/L (ref <8.0)

## 2021-09-10 LAB — C3 AND C4
C3 Complement: 110 mg/dL (ref 83–193)
C4 Complement: 27 mg/dL (ref 15–57)

## 2021-09-10 LAB — ANTI-SMITH ANTIBODY: ENA SM Ab Ser-aCnc: <1 AI

## 2021-09-10 LAB — SEDIMENTATION RATE: Sed Rate: 9 mm/h (ref 0–20)

## 2021-09-10 LAB — CARDIOLIPIN ANTIBODIES, IGG, IGM, IGA
Anticardiolipin IgA: >65 APL-U/mL — ABNORMAL HIGH
Anticardiolipin IgG: 36.6 GPL-U/mL — ABNORMAL HIGH
Anticardiolipin IgM: >112 MPL-U/mL — ABNORMAL HIGH

## 2021-09-10 LAB — RNP ANTIBODY: Ribonucleic Protein(ENA) Antibody, IgG: <1 AI

## 2021-09-10 LAB — SJOGRENS SYNDROME-A EXTRACTABLE NUCLEAR ANTIBODY: SSA (Ro) (ENA) Antibody, IgG: 4.9 AI — AB

## 2021-09-10 LAB — ANTI-DNA ANTIBODY, DOUBLE-STRANDED: ds DNA Ab: 1 IU/mL

## 2021-09-10 LAB — ANA: Anti Nuclear Antibody (ANA): POSITIVE — AB

## 2021-09-10 LAB — CRYOGLOBULIN: Cryoglobulin, Qualitative Analysis: NOT DETECTED
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
## 2021-09-24 ENCOUNTER — Encounter: Payer: Self-pay | Admitting: Internal Medicine

## 2021-09-24 ENCOUNTER — Other Ambulatory Visit: Payer: Self-pay

## 2021-09-24 ENCOUNTER — Ambulatory Visit: Payer: Medicaid Other | Admitting: Internal Medicine

## 2021-09-24 VITALS — BP 112/78 | HR 80 | Resp 16 | Ht 62.0 in | Wt 146.8 lb

## 2021-09-24 DIAGNOSIS — G8929 Other chronic pain: Secondary | ICD-10-CM

## 2021-09-24 DIAGNOSIS — M329 Systemic lupus erythematosus, unspecified: Secondary | ICD-10-CM | POA: Diagnosis not present

## 2021-09-24 DIAGNOSIS — M5441 Lumbago with sciatica, right side: Secondary | ICD-10-CM

## 2021-09-24 MED ORDER — HYDROXYCHLOROQUINE SULFATE 200 MG PO TABS
200.0000 mg | ORAL_TABLET | Freq: Every day | ORAL | 2 refills | Status: DC
Start: 2021-09-24 — End: 2021-11-26

## 2021-09-24 MED ORDER — ONDANSETRON HCL 4 MG PO TABS: 4.0000 mg | ORAL_TABLET | Freq: Three times a day (TID) | ORAL | 1 refills | Status: DC | PRN

## 2021-09-24 NOTE — Patient Instructions (Signed)
Hydroxychloroquine Tablets What is this medication? HYDROXYCHLOROQUINE (hye drox ee KLOR oh kwin) treats autoimmune conditions, such as rheumatoid arthritis and lupus. It works by slowing down an overactive immune system. It may also be used to prevent and treat malaria. It works by killing the parasite that causes malaria. It belongs to a group of medications called DMARDs. This medicine may be used for other purposes; ask your health care provider or pharmacist if you have questions. COMMON BRAND NAME(S): Plaquenil, Quineprox What should I tell my care team before I take this medication? They need to know if you have any of these conditions: Diabetes Eye disease, vision problems G6PD deficiency Heart disease History of irregular heartbeat If you often drink alcohol Kidney disease Liver disease Porphyria Psoriasis An unusual or allergic reaction to chloroquine, hydroxychloroquine, other medications, foods, dyes, or preservatives Pregnant or trying to get pregnant Breast-feeding How should I use this medication? Take this medication by mouth with a glass of water. Take it as directed on the prescription label. Do not cut, crush or chew this medication. Swallow the tablets whole. Take it with food. Do not take it more than directed. Take all of this medication unless your care team tells you to stop it early. Keep taking it even if you think you are better. Take products with antacids in them at a different time of day than this medication. Take this medication 4 hours before or 4 hours after antacids. Talk to your care team if you have questions. Talk to your care team about the use of this medication in children. While this medication may be prescribed for selected conditions, precautions do apply. Overdosage: If you think you have taken too much of this medicine contact a poison control center or emergency room at once. NOTE: This medicine is only for you. Do not share this medicine with  others. What if I miss a dose? If you miss a dose, take it as soon as you can. If it is almost time for your next dose, take only that dose. Do not take double or extra doses. What may interact with this medication? Do not take this medication with any of the following: Cisapride Dronedarone Pimozide Thioridazine This medication may also interact with the following: Ampicillin Antacids Cimetidine Cyclosporine Digoxin Kaolin Medications for diabetes, like insulin, glipizide, glyburide Medications for seizures like carbamazepine, phenobarbital, phenytoin Mefloquine Methotrexate Other medications that prolong the QT interval (cause an abnormal heart rhythm) Praziquantel This list may not describe all possible interactions. Give your health care provider a list of all the medicines, herbs, non-prescription drugs, or dietary supplements you use. Also tell them if you smoke, drink alcohol, or use illegal drugs. Some items may interact with your medicine. What should I watch for while using this medication? Visit your care team for regular checks on your progress. Tell your care team if your symptoms do not start to get better or if they get worse. You may need blood work done while you are taking this medication. If you take other medications that can affect heart rhythm, you may need more testing. Talk to your care team if you have questions. Your vision may be tested before and during use of this medication. Tell your care team right away if you have any change in your eyesight. This medication may cause serious skin reactions. They can happen weeks to months after starting the medication. Contact your care team right away if you notice fevers or flu-like symptoms with a rash. The   rash may be red or purple and then turn into blisters or peeling of the skin. Or, you might notice a red rash with swelling of the face, lips or lymph nodes in your neck or under your arms. If you or your family  notice any changes in your behavior, such as new or worsening depression, thoughts of harming yourself, anxiety, or other unusual or disturbing thoughts, or memory loss, call your care team right away. What side effects may I notice from receiving this medication? Side effects that you should report to your care team as soon as possible: Allergic reactions-skin rash, itching, hives, swelling of the face, lips, tongue, or throat Aplastic anemia-unusual weakness or fatigue, dizziness, headache, trouble breathing, increased bleeding or bruising Change in vision Heart rhythm changes-fast or irregular heartbeat, dizziness, feeling faint or lightheaded, chest pain, trouble breathing Infection-fever, chills, cough, or sore throat Low blood sugar (hypoglycemia)-tremors or shaking, anxiety, sweating, cold or clammy skin, confusion, dizziness, rapid heartbeat Muscle injury-unusual weakness or fatigue, muscle pain, dark yellow or brown urine, decrease in amount of urine Pain, tingling, or numbness in the hands or feet Rash, fever, and swollen lymph nodes Redness, blistering, peeling, or loosening of the skin, including inside the mouth Thoughts of suicide or self-harm, worsening mood, or feelings of depression Unusual bruising or bleeding Side effects that usually do not require medical attention (report to your care team if they continue or are bothersome): Diarrhea Headache Nausea Stomach pain Vomiting This list may not describe all possible side effects. Call your doctor for medical advice about side effects. You may report side effects to FDA at 1-800-FDA-1088. Where should I keep my medication? Keep out of the reach of children and pets. Store at room temperature up to 30 degrees C (86 degrees F). Protect from light. Get rid of any unused medication after the expiration date. To get rid of medications that are no longer needed or have expired: Take the medication to a medication take-back  program. Check with your pharmacy or law enforcement to find a location. If you cannot return the medication, check the label or package insert to see if the medication should be thrown out in the garbage or flushed down the toilet. If you are not sure, ask your care team. If it is safe to put it in the trash, empty the medication out of the container. Mix the medication with cat litter, dirt, coffee grounds, or other unwanted substance. Seal the mixture in a bag or container. Put it in the trash. NOTE: This sheet is a summary. It may not cover all possible information. If you have questions about this medicine, talk to your doctor, pharmacist, or health care provider.  2022 Elsevier/Gold Standard (2021-03-12 10:19:21)  

## 2021-09-24 NOTE — Progress Notes (Deleted)
Office Visit Note  Patient: Christina Morrison             Date of Birth: [birth year]           MRN: [id number]             PCP: Pcp, No Visit Date: 09/24/2021   Assessment:        Visit Diagnoses:  1. Lupus (HCC)   2. Chronic right-sided low back pain with right-sided sciatica      Follow-Up Instructions: Return in about 2 months (around 11/24/2021) for SLE on HCQ restart f/u 8wks.  Orders: Orders Placed This Encounter  Procedures   Ambulatory referral to Ophthalmology   Meds ordered this encounter  Medications   hydroxychloroquine (PLAQUENIL) 200 MG tablet    Sig: Take 1 tablet (200 mg total) by mouth daily.    Dispense:  30 tablet    Refill:  2     Subjective:    Allergies: Ranexa [ranolazine]   Activities of Daily Living: ***   History of Present Illness: Christina Morrison is a 45 y.o. female ***      Investigation: No additional findings.   Objective: Vital Signs: BP 112/78 (BP Location: Left Arm, Patient Position: Sitting, Cuff Size: Normal)   Pulse 80   Resp 16   Ht 5\' 2"  (1.575 m)   Wt 146 lb 12.8 oz (66.6 kg)   BMI 26.85 kg/m    Physical Exam   Musculoskeletal Exam: ***  CDAI Exam: CDAI Score: -- Patient Global: --; Provider Global: -- Swollen: --; Tender: -- Joint Exam 09/24/2021   No joint exam has been documented for this visit   There is currently no information documented on the homunculus. Go to the Rheumatology activity and complete the homunculus joint exam.  Speciality Comments: No specialty comments available.  Imaging: No results found.   PMFS History:  Patient Active Problem List   Diagnosis Date Noted   Lupus (HCC) 09/03/2021   Bilateral hand pain 09/03/2021   Bilateral knee pain 09/03/2021   Pain in right ankle and joints of right foot 09/03/2021   Laryngopharyngeal reflux (LPR) 10/09/2020   Chronic rhinitis 05/03/2020   Nasal septal perforation 05/03/2020   Chronic combined systolic and diastolic CHF (congestive heart  failure) (HCC) 11/26/2016   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   Cardiac arrest (HCC) 11/03/2016   Cardiogenic shock (HCC) 11/03/2016   Chronic narcotic use 11/02/2016   Depression 11/01/2016   Hypertension 10/31/2016   Chest pain 10/30/2016   Dyslipidemia 04/11/2015   Unstable angina (HCC) 04/11/2015   CAD S/P percutaneous coronary angioplasty 03/23/2015   Cocaine abuse (HCC) 03/23/2015   Chronic lower back pain 01/24/2015   Family history of diabetes mellitus (DM) 01/24/2015   Anxiety and depression 01/24/2015   Tobacco use disorder 01/24/2015   IUD (intrauterine device) in place 01/24/2015    Past Medical History:  Diagnosis Date   ADHD    Anxiety    Bipolar 1 disorder (HCC)    Borderline personality disorder (HCC)    Cardiac arrest (HCC) 11/03/2016   Cardiogenic shock (HCC) 11/03/2016   Chronic pain    Cocaine use    Coronary artery disease    a. 03/2015 NSTEMI/PCI in setting of cocaine use - BMS to diagonal. b. NSTEMI 12/2015 s/o overlapping DES to Cx, residual mild RCA and LAD disease, 75% OM1.   COVID-19    Depression    Dyslipidemia    GERD (gastroesophageal reflux disease)  HLD (hyperlipidemia)    Hypertension    Migraine    Osteoarthritis    Polysubstance abuse (HCC)    a. 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   LIMA to LAD, SVG to LCx, SVG to LAD, EVH via right thigh and leg   Scoliosis    TIA (transient ischemic attack)     Family History  Problem Relation Age of Onset   Hypertension Mother    Hyperlipidemia Mother    Depression Mother    Hypertension Father    Hyperlipidemia Father    Heart disease Father    Diabetes Father    Bipolar disorder Sister    Varicose Veins Brother    Cancer Maternal Aunt    Stroke Maternal Grandmother    Anxiety disorder Daughter    Anxiety disorder Daughter    Past Surgical History:  Procedure Laterality Date   CARDIAC CATHETERIZATION  03/21/2015   Procedure: CORONARY STENT INTERVENTION;  Surgeon: [name], MD;   Location: [facility] CATH LAB;  Service: Cardiovascular;;  diag bms 2.75 x 12 vision   CARDIAC CATHETERIZATION N/A 01/14/2016   Procedure: Coronary Stent Intervention;  Surgeon: [name], MD;  Location: [facility] INVASIVE CV LAB;  Service: Cardiovascular;  Laterality: N/A;   CARDIAC CATHETERIZATION N/A 10/31/2016   Procedure: Left Heart Cath and Coronary Angiography;  Surgeon: Yvonne Kendall, MD;  Location: [facility] INVASIVE CV LAB;  Service: Cardiovascular;  Laterality: N/A;   CARDIAC CATHETERIZATION N/A 10/31/2016   Procedure: Intravascular Ultrasound/IVUS;  Surgeon: Yvonne Kendall, MD;  Location: [facility] INVASIVE CV LAB;  Service: Cardiovascular;  Laterality: N/A;   CORONARY ARTERY BYPASS GRAFT N/A 11/03/2016   Procedure: CORONARY ARTERY BYPASS GRAFTING (CABG) x  three, using left internal mammary artery and right leg greater saphenous vein harvested endoscopically;  Surgeon: Purcell Nails, MD;  Location: [facility] OR;  Service: Open Heart Surgery;  Laterality: N/A;   CORONARY ARTERY BYPASS GRAFT N/A 11/03/2016   Procedure: EMERGENCY REDO STERNOTOMY IN ICU, REDO CABG X 1, PLACEMENT OF BILATERAL VAD, PLACEMENT OF LEFT FEMORAL ARTERIAL LINE;  Surgeon: Purcell Nails, MD;  Location: [facility] OR;  Service: Open Heart Surgery;  Laterality: N/A;   CORONARY STENT PLACEMENT  03/21/2015   first diagonal   ESOPHAGOGASTRODUODENOSCOPY (EGD) WITH PROPOFOL N/A 06/11/2015   Procedure: ESOPHAGOGASTRODUODENOSCOPY (EGD) WITH PROPOFOL;  Surgeon: Bernette Redbird, MD;  Location: [facility] ENDOSCOPY;  Service: Endoscopy;  Laterality: N/A;   HEMATOMA EVACUATION Left 11/07/2016   Procedure: EVACUATION HEMATOMA;  Surgeon: Kerin Perna, MD;  Location: [facility] OR;  Service: Thoracic;  Laterality: Left;   LEFT HEART CATHETERIZATION WITH CORONARY ANGIOGRAM N/A 03/21/2015   Procedure: LEFT HEART CATHETERIZATION WITH CORONARY ANGIOGRAM;  Surgeon: [name], MD;  Location: [facility] CATH LAB;  Service: Cardiovascular;  Laterality: N/A;   LEFT HEART CATHETERIZATION WITH  CORONARY ANGIOGRAM N/A 04/11/2015   Procedure: LEFT HEART CATHETERIZATION WITH CORONARY ANGIOGRAM;  Surgeon: Kathleene Hazel, MD;  Location: [facility] CATH LAB;  Service: Cardiovascular;  Laterality: N/A;   MULTIPLE TOOTH EXTRACTIONS     REMOVAL OF CENTRIMAG VENTRICULAR ASSIST DEVICE N/A 11/07/2016   Procedure: REMOVAL OF CENTRIMAG VENTRICULAR ASSIST DEVICE;  Surgeon: Kerin Perna, MD;  Location: [facility] OR;  Service: Open Heart Surgery;  Laterality: N/A;   STERNAL CLOSURE N/A 11/07/2016   Procedure: STERNAL WASHOUT WITH STERNAL CLOSURE;  Surgeon: Kerin Perna, MD;  Location: [facility] OR;  Service: Thoracic;  Laterality: N/A;   TEE WITHOUT CARDIOVERSION N/A 11/03/2016   Procedure: TRANSESOPHAGEAL ECHOCARDIOGRAM (TEE);  Surgeon: Purcell Nails, MD;  Location: [facility] OR;  Service: Open Heart Surgery;  Laterality: N/A;   TEE WITHOUT CARDIOVERSION N/A 11/07/2016   Procedure: TRANSESOPHAGEAL ECHOCARDIOGRAM (TEE);  Surgeon: Kerin Perna, MD;  Location: [facility] OR;  Service: Thoracic;  Laterality: N/A;   WISDOM TOOTH EXTRACTION     Social History   Social History Narrative   Not on file     Procedures:  No procedures performed  Elmyra Ricks, RT  Note - This record has been created using [employer]. Chart creation errors have been sought, but may not always have been located. Such creation errors do not reflect on the standard of medical care.

## 2021-09-26 DIAGNOSIS — F419 Anxiety disorder, unspecified: Secondary | ICD-10-CM | POA: Diagnosis not present

## 2021-09-26 DIAGNOSIS — F3161 Bipolar disorder, current episode mixed, mild: Secondary | ICD-10-CM | POA: Diagnosis not present

## 2021-10-16 DIAGNOSIS — J3489 Other specified disorders of nose and nasal sinuses: Secondary | ICD-10-CM | POA: Diagnosis not present
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
## 2021-11-26 ENCOUNTER — Ambulatory Visit (INDEPENDENT_AMBULATORY_CARE_PROVIDER_SITE_OTHER): Payer: Medicaid Other

## 2021-11-26 ENCOUNTER — Other Ambulatory Visit: Payer: Self-pay

## 2021-11-26 ENCOUNTER — Ambulatory Visit (INDEPENDENT_AMBULATORY_CARE_PROVIDER_SITE_OTHER): Payer: Medicaid Other | Admitting: Internal Medicine

## 2021-11-26 ENCOUNTER — Encounter: Payer: Self-pay | Admitting: Internal Medicine

## 2021-11-26 VITALS — BP 143/88 | HR 80 | Ht 62.0 in | Wt 148.0 lb

## 2021-11-26 DIAGNOSIS — E785 Hyperlipidemia, unspecified: Secondary | ICD-10-CM

## 2021-11-26 DIAGNOSIS — Z79899 Other long term (current) drug therapy: Secondary | ICD-10-CM

## 2021-11-26 DIAGNOSIS — I2510 Atherosclerotic heart disease of native coronary artery without angina pectoris: Secondary | ICD-10-CM | POA: Diagnosis not present

## 2021-11-26 DIAGNOSIS — M25561 Pain in right knee: Secondary | ICD-10-CM | POA: Diagnosis not present

## 2021-11-26 DIAGNOSIS — M329 Systemic lupus erythematosus, unspecified: Secondary | ICD-10-CM

## 2021-11-26 DIAGNOSIS — M25562 Pain in left knee: Secondary | ICD-10-CM | POA: Diagnosis not present

## 2021-11-26 DIAGNOSIS — G8929 Other chronic pain: Secondary | ICD-10-CM

## 2021-11-26 DIAGNOSIS — I2583 Coronary atherosclerosis due to lipid rich plaque: Secondary | ICD-10-CM | POA: Diagnosis not present

## 2021-11-26 DIAGNOSIS — M5441 Lumbago with sciatica, right side: Secondary | ICD-10-CM | POA: Diagnosis not present

## 2021-11-26 DIAGNOSIS — IMO0002 Reserved for concepts with insufficient information to code with codable children: Secondary | ICD-10-CM

## 2021-11-26 MED ORDER — ONDANSETRON 4 MG PO TBDP: 4.0000 mg | ORAL_TABLET | Freq: Three times a day (TID) | ORAL | 0 refills | Status: DC | PRN

## 2021-11-26 MED ORDER — HYDROXYCHLOROQUINE SULFATE 200 MG PO TABS: 200.0000 mg | ORAL_TABLET | Freq: Every day | ORAL | 2 refills | Status: DC

## 2021-11-26 MED ORDER — PANTOPRAZOLE SODIUM 40 MG PO TBEC: 40.0000 mg | DELAYED_RELEASE_TABLET | Freq: Every day | ORAL | 2 refills | Status: DC

## 2021-11-26 NOTE — Patient Instructions (Signed)
Leflunomide tablets °What is this medication? °LEFLUNOMIDE (le FLOO na mide) is for rheumatoid arthritis. °This medicine may be used for other purposes; ask your health care provider or pharmacist if you have questions. °COMMON BRAND NAME(S): Arava °What should I tell my care team before I take this medication? °They need to know if you have any of these conditions: °diabetes °have a fever or infection °high blood pressure °immune system problems °kidney disease °liver disease °low blood cell counts, like low white cell, platelet, or red cell counts °lung or breathing disease, like asthma °recently received or scheduled to receive a vaccine °receiving treatment for cancer °skin conditions or sensitivity °tingling of the fingers or toes, or other nerve disorder °tuberculosis °an unusual or allergic reaction to leflunomide, teriflunomide, other medicines, food, dyes, or preservatives °pregnant or trying to get pregnant °breast-feeding °How should I use this medication? °Take this medicine by mouth with a full glass of water. Follow the directions on the prescription label. Take your medicine at regular intervals. Do not take your medicine more often than directed. Do not stop taking except on your doctor's advice. °Talk to your pediatrician regarding the use of this medicine in children. Special care may be needed. °Overdosage: If you think you have taken too much of this medicine contact a poison control center or emergency room at once. °NOTE: This medicine is only for you. Do not share this medicine with others. °What if I miss a dose? °If you miss a dose, take it as soon as you can. If it is almost time for your next dose, take only that dose. Do not take double or extra doses. °What may interact with this medication? °Do not take this medicine with any of the following medications: °teriflunomide °This medicine may also interact with the following medications: °alosetron °birth control  pills °caffeine °cefaclor °certain medicines for diabetes like nateglinide, repaglinide, rosiglitazone, pioglitazone °certain medicines for high cholesterol like atorvastatin, pravastatin, rosuvastatin, simvastatin °charcoal °cholestyramine °ciprofloxacin °duloxetine °furosemide °ketoprofen °live virus vaccines °medicines that increase your risk for infection °methotrexate °mitoxantrone °paclitaxel °penicillin °theophylline °tizanidine °warfarin °This list may not describe all possible interactions. Give your health care provider a list of all the medicines, herbs, non-prescription drugs, or dietary supplements you use. Also tell them if you smoke, drink alcohol, or use illegal drugs. Some items may interact with your medicine. °What should I watch for while using this medication? °Visit your health care provider for regular checks on your progress. Tell your doctor or health care provider if your symptoms do not start to get better or if they get worse. You may need blood work done while you are taking this medicine. °This medicine may cause serious skin reactions. They can happen weeks to months after starting the medicine. Contact your health care provider right away if you notice fevers or flu-like symptoms with a rash. The rash may be red or purple and then turn into blisters or peeling of the skin. Or, you might notice a red rash with swelling of the face, lips or lymph nodes in your neck or under your arms. °This medicine may stay in your body for up to 2 years after your last dose. Tell your doctor about any unusual side effects or symptoms. A medicine can be given to help lower your blood levels of this medicine more quickly. °Women must use effective birth control with this medicine. There is a potential for serious side effects to an unborn child. Do not become pregnant while   taking this medicine. Inform your doctor if you wish to become pregnant. This medicine remains in your blood after you stop taking  it. You must continue using effective birth control until the blood levels have been checked and they are low enough. A medicine can be given to help lower your blood levels of this medicine more quickly. Immediately talk to your doctor if you think you may be pregnant. You may need a pregnancy test. Talk to your health care provider or pharmacist for more information. °You should not receive certain vaccines during your treatment and for a certain time after your treatment with this medication ends. Talk to your health care provider for more information. °What side effects may I notice from receiving this medication? °Side effects that you should report to your doctor or health care professional as soon as possible: °allergic reactions like skin rash, itching or hives, swelling of the face, lips, or tongue °breathing problems °cough °increased blood pressure °low blood counts - this medicine may decrease the number of white blood cells and platelets. You may be at increased risk for infections and bleeding. °pain, tingling, numbness in the hands or feet °rash, fever, and swollen lymph nodes °redness, blistering, peeing or loosening of the skin, including inside the mouth °signs of decreased platelets or bleeding - bruising, pinpoint red spots on the skin, black, tarry stools, blood in urine °signs of infection - fever or chills, cough, sore throat, pain or trouble passing urine °signs and symptoms of liver injury like dark yellow or brown urine; general ill feeling or flu-like symptoms; light-colored stools; loss of appetite; nausea; right upper belly pain; unusually weak or tired; yellowing of the eyes or skin °trouble passing urine or change in the amount of urine °vomiting °Side effects that usually do not require medical attention (report to your doctor or health care professional if they continue or are bothersome): °diarrhea °hair thinning or loss °headache °nausea °tiredness °This list may not describe all  possible side effects. Call your doctor for medical advice about side effects. You may report side effects to FDA at 1-800-FDA-1088. °Where should I keep my medication? °Keep out of the reach of children. °Store at room temperature between 15 and 30 degrees C (59 and 86 degrees F). Protect from moisture and light. Throw away any unused medicine after the expiration date. °NOTE: This sheet is a summary. It may not cover all possible information. If you have questions about this medicine, talk to your doctor, pharmacist, or health care provider. °© 2022 [employer] (2019-03-11 00:00:00) ° °

## 2021-11-30 LAB — BETA-2 GLYCOPROTEIN ANTIBODIES
Beta-2 Glyco 1 IgA: 44.3 U/mL — ABNORMAL HIGH
Beta-2 Glyco 1 IgM: >112 U/mL — ABNORMAL HIGH
Beta-2 Glyco I IgG: 23.3 U/mL — ABNORMAL HIGH

## 2021-11-30 LAB — CBC WITH DIFFERENTIAL/PLATELET
Absolute Monocytes: 218 cells/uL (ref 200–950)
Basophils Absolute: 21 cells/uL (ref 0–200)
Basophils Relative: 0.4 %
Eosinophils Absolute: 172 cells/uL (ref 15–500)
Eosinophils Relative: 3.3 %
HCT: 39.5 % (ref 35.0–45.0)
Hemoglobin: 13.4 g/dL (ref 11.7–15.5)
Lymphs Abs: 1232 cells/uL (ref 850–3900)
MCH: 29.1 pg (ref 27.0–33.0)
MCHC: 33.9 g/dL (ref 32.0–36.0)
MCV: 85.7 fL (ref 80.0–100.0)
MPV: 9.6 fL (ref 7.5–12.5)
Monocytes Relative: 4.2 %
Neutro Abs: 3557 cells/uL (ref 1500–7800)
Neutrophils Relative %: 68.4 %
Platelets: 178 10*3/uL (ref 140–400)
RBC: 4.61 10*6/uL (ref 3.80–5.10)
RDW: 12.6 % (ref 11.0–15.0)
Total Lymphocyte: 23.7 %
WBC: 5.2 10*3/uL (ref 3.8–10.8)

## 2021-11-30 LAB — COMPLETE METABOLIC PANEL WITH GFR
AG Ratio: 1.6 (calc) (ref 1.0–2.5)
ALT: 9 U/L (ref 6–29)
AST: 12 U/L (ref 10–35)
Albumin: 4.4 g/dL (ref 3.6–5.1)
Alkaline phosphatase (APISO): 112 U/L (ref 31–125)
BUN: 11 mg/dL (ref 7–25)
CO2: 29 mmol/L (ref 20–32)
Calcium: 8.9 mg/dL (ref 8.6–10.2)
Chloride: 102 mmol/L (ref 98–110)
Creat: 0.83 mg/dL (ref 0.50–0.99)
Globulin: 2.8 g/dL (calc) (ref 1.9–3.7)
Glucose, Bld: 79 mg/dL (ref 65–99)
Potassium: 3.8 mmol/L (ref 3.5–5.3)
Sodium: 138 mmol/L (ref 135–146)
Total Bilirubin: 0.4 mg/dL (ref 0.2–1.2)
Total Protein: 7.2 g/dL (ref 6.1–8.1)
eGFR: 89 mL/min/{1.73_m2} (ref >=60)

## 2021-11-30 LAB — RFX DRVVT 1:1 MIX

## 2021-11-30 LAB — CARDIOLIPIN ANTIBODIES, IGG, IGM, IGA
Anticardiolipin IgA: >65 APL-U/mL — ABNORMAL HIGH
Anticardiolipin IgG: 28.8 GPL-U/mL — ABNORMAL HIGH
Anticardiolipin IgM: >112 MPL-U/mL — ABNORMAL HIGH

## 2021-11-30 LAB — LUPUS ANTICOAGULANT EVAL W/ REFLEX
PTT-LA Screen: 46 s — ABNORMAL HIGH (ref <=40)
dRVVT: 52 s — ABNORMAL HIGH (ref <=45)

## 2021-11-30 LAB — RFLX DRVVT CONFRIM: DRVVT CONFIRM: POSITIVE — AB

## 2021-11-30 LAB — RFLX HEXAGONAL PHASE CONFIRM: Hexagonal Phase Conf: NEGATIVE

## 2021-12-05 DIAGNOSIS — F419 Anxiety disorder, unspecified: Secondary | ICD-10-CM | POA: Diagnosis not present

## 2021-12-05 DIAGNOSIS — F902 Attention-deficit hyperactivity disorder, combined type: Secondary | ICD-10-CM | POA: Diagnosis not present

## 2021-12-05 DIAGNOSIS — F3161 Bipolar disorder, current episode mixed, mild: Secondary | ICD-10-CM | POA: Diagnosis not present

## 2021-12-10 DIAGNOSIS — Z79891 Long term (current) use of opiate analgesic: Secondary | ICD-10-CM | POA: Diagnosis not present

## 2022-01-02 DIAGNOSIS — H02886 Meibomian gland dysfunction of left eye, unspecified eyelid: Secondary | ICD-10-CM | POA: Diagnosis not present

## 2022-01-02 DIAGNOSIS — L932 Other local lupus erythematosus: Secondary | ICD-10-CM | POA: Diagnosis not present

## 2022-01-02 DIAGNOSIS — H02883 Meibomian gland dysfunction of right eye, unspecified eyelid: Secondary | ICD-10-CM | POA: Diagnosis not present

## 2022-01-02 DIAGNOSIS — Z79899 Other long term (current) drug therapy: Secondary | ICD-10-CM | POA: Diagnosis not present

## 2022-01-06 DIAGNOSIS — Z79891 Long term (current) use of opiate analgesic: Secondary | ICD-10-CM | POA: Diagnosis not present

## 2022-02-03 DIAGNOSIS — J329 Chronic sinusitis, unspecified: Secondary | ICD-10-CM | POA: Diagnosis not present

## 2022-02-03 DIAGNOSIS — J3489 Other specified disorders of nose and nasal sinuses: Secondary | ICD-10-CM | POA: Diagnosis not present

## 2022-02-03 DIAGNOSIS — M329 Systemic lupus erythematosus, unspecified: Secondary | ICD-10-CM | POA: Diagnosis not present

## 2022-02-13 DIAGNOSIS — F419 Anxiety disorder, unspecified: Secondary | ICD-10-CM | POA: Diagnosis not present

## 2022-02-13 DIAGNOSIS — F3161 Bipolar disorder, current episode mixed, mild: Secondary | ICD-10-CM | POA: Diagnosis not present

## 2022-02-13 DIAGNOSIS — F902 Attention-deficit hyperactivity disorder, combined type: Secondary | ICD-10-CM | POA: Diagnosis not present

## 2022-02-15 ENCOUNTER — Other Ambulatory Visit: Payer: Self-pay | Admitting: Internal Medicine

## 2022-02-15 DIAGNOSIS — I2510 Atherosclerotic heart disease of native coronary artery without angina pectoris: Secondary | ICD-10-CM

## 2022-02-15 DIAGNOSIS — E785 Hyperlipidemia, unspecified: Secondary | ICD-10-CM

## 2022-02-15 DIAGNOSIS — I2583 Coronary atherosclerosis due to lipid rich plaque: Secondary | ICD-10-CM
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
## 2022-02-25 ENCOUNTER — Ambulatory Visit: Payer: Medicaid Other | Admitting: Internal Medicine

## 2022-02-26 ENCOUNTER — Telehealth: Payer: Self-pay

## 2022-02-26 NOTE — Telephone Encounter (Signed)
Patient called stating she "forgot about her appointment yesterday" and rescheduled for 04/21/22 at 2:00 pm.  Patient requested a return call to discuss the results of her labwork and x-rays.  Patient states the results are in [place], but she doesn't understand the results and "really needs to discuss them with Dr. Benjamine Mola."   ? ?FYI:  Patient was offered multiple earlier dates to reschedule the appointment, but she declined because they were not late afternoon.  Patient states "it is just too difficult for me to come into the office before 2:00 pm."   ?

## 2022-03-17 DIAGNOSIS — J3489 Other specified disorders of nose and nasal sinuses: Secondary | ICD-10-CM | POA: Diagnosis not present

## 2022-03-19 ENCOUNTER — Other Ambulatory Visit: Payer: Self-pay | Admitting: Internal Medicine

## 2022-03-19 DIAGNOSIS — I2510 Atherosclerotic heart disease of native coronary artery without angina pectoris: Secondary | ICD-10-CM

## 2022-03-19 DIAGNOSIS — E785 Hyperlipidemia, unspecified: Secondary | ICD-10-CM

## 2022-03-24 DIAGNOSIS — J3489 Other specified disorders of nose and nasal sinuses: Secondary | ICD-10-CM | POA: Diagnosis not present
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
## 2022-04-21 ENCOUNTER — Encounter: Payer: Self-pay | Admitting: Internal Medicine

## 2022-04-21 ENCOUNTER — Ambulatory Visit (INDEPENDENT_AMBULATORY_CARE_PROVIDER_SITE_OTHER): Payer: Medicaid Other | Admitting: Internal Medicine

## 2022-04-21 VITALS — BP 106/73 | HR 90 | Resp 15 | Ht 63.0 in | Wt 165.0 lb

## 2022-04-21 DIAGNOSIS — E785 Hyperlipidemia, unspecified: Secondary | ICD-10-CM

## 2022-04-21 DIAGNOSIS — I2510 Atherosclerotic heart disease of native coronary artery without angina pectoris: Secondary | ICD-10-CM

## 2022-04-21 DIAGNOSIS — M25561 Pain in right knee: Secondary | ICD-10-CM | POA: Diagnosis not present

## 2022-04-21 DIAGNOSIS — M25562 Pain in left knee: Secondary | ICD-10-CM | POA: Diagnosis not present

## 2022-04-21 DIAGNOSIS — K219 Gastro-esophageal reflux disease without esophagitis: Secondary | ICD-10-CM

## 2022-04-21 DIAGNOSIS — I5042 Chronic combined systolic (congestive) and diastolic (congestive) heart failure: Secondary | ICD-10-CM

## 2022-04-21 DIAGNOSIS — I2583 Coronary atherosclerosis due to lipid rich plaque: Secondary | ICD-10-CM | POA: Diagnosis not present

## 2022-04-21 DIAGNOSIS — G8929 Other chronic pain: Secondary | ICD-10-CM

## 2022-04-21 DIAGNOSIS — M5441 Lumbago with sciatica, right side: Secondary | ICD-10-CM | POA: Diagnosis not present

## 2022-04-21 DIAGNOSIS — M329 Systemic lupus erythematosus, unspecified: Secondary | ICD-10-CM

## 2022-04-21 DIAGNOSIS — Z79899 Other long term (current) drug therapy: Secondary | ICD-10-CM

## 2022-04-21 MED ORDER — PANTOPRAZOLE SODIUM 40 MG PO TBEC: 40.0000 mg | DELAYED_RELEASE_TABLET | Freq: Every day | ORAL | 0 refills | Status: DC

## 2022-04-21 MED ORDER — FUROSEMIDE 40 MG PO TABS: 40.0000 mg | ORAL_TABLET | Freq: Every day | ORAL | 0 refills | Status: DC

## 2022-04-21 MED ORDER — CLOBETASOL PROPIONATE 0.05 % EX OINT: 1.0000 "application " | TOPICAL_OINTMENT | Freq: Every day | CUTANEOUS | 6 refills | Status: DC | PRN

## 2022-04-21 MED ORDER — HYDROXYCHLOROQUINE SULFATE 200 MG PO TABS: 200.0000 mg | ORAL_TABLET | Freq: Every day | ORAL | 0 refills | Status: DC

## 2022-04-21 MED ORDER — LEFLUNOMIDE 10 MG PO TABS: 10.0000 mg | ORAL_TABLET | Freq: Every day | ORAL | 1 refills | Status: DC

## 2022-04-21 NOTE — Patient Instructions (Signed)
Leflunomide tablets ?What is this medication? ?LEFLUNOMIDE (le FLOO na mide) is for rheumatoid arthritis. ?This medicine may be used for other purposes; ask your health care provider or pharmacist if you have questions. ?COMMON BRAND NAME(S): Arava ?What should I tell my care team before I take this medication? ?They need to know if you have any of these conditions: ?diabetes ?have a fever or infection ?high blood pressure ?immune system problems ?kidney disease ?liver disease ?low blood cell counts, like low white cell, platelet, or red cell counts ?lung or breathing disease, like asthma ?recently received or scheduled to receive a vaccine ?receiving treatment for cancer ?skin conditions or sensitivity ?tingling of the fingers or toes, or other nerve disorder ?tuberculosis ?an unusual or allergic reaction to leflunomide, teriflunomide, other medicines, food, dyes, or preservatives ?pregnant or trying to get pregnant ?breast-feeding ?How should I use this medication? ?Take this medicine by mouth with a full glass of water. Follow the directions on the prescription label. Take your medicine at regular intervals. Do not take your medicine more often than directed. Do not stop taking except on your doctor's advice. ?Talk to your pediatrician regarding the use of this medicine in children. Special care may be needed. ?Overdosage: If you think you have taken too much of this medicine contact a poison control center or emergency room at once. ?NOTE: This medicine is only for you. Do not share this medicine with others. ?What if I miss a dose? ?If you miss a dose, take it as soon as you can. If it is almost time for your next dose, take only that dose. Do not take double or extra doses. ?What may interact with this medication? ?Do not take this medicine with any of the following medications: ?teriflunomide ?This medicine may also interact with the following medications: ?alosetron ?birth control  pills ?caffeine ?cefaclor ?certain medicines for diabetes like nateglinide, repaglinide, rosiglitazone, pioglitazone ?certain medicines for high cholesterol like atorvastatin, pravastatin, rosuvastatin, simvastatin ?charcoal ?cholestyramine ?ciprofloxacin ?duloxetine ?furosemide ?ketoprofen ?live virus vaccines ?medicines that increase your risk for infection ?methotrexate ?mitoxantrone ?paclitaxel ?penicillin ?theophylline ?tizanidine ?warfarin ?This list may not describe all possible interactions. Give your health care provider a list of all the medicines, herbs, non-prescription drugs, or dietary supplements you use. Also tell them if you smoke, drink alcohol, or use illegal drugs. Some items may interact with your medicine. ?What should I watch for while using this medication? ?Visit your health care provider for regular checks on your progress. Tell your doctor or health care provider if your symptoms do not start to get better or if they get worse. You may need blood work done while you are taking this medicine. ?This medicine may cause serious skin reactions. They can happen weeks to months after starting the medicine. Contact your health care provider right away if you notice fevers or flu-like symptoms with a rash. The rash may be red or purple and then turn into blisters or peeling of the skin. Or, you might notice a red rash with swelling of the face, lips or lymph nodes in your neck or under your arms. ?This medicine may stay in your body for up to 2 years after your last dose. Tell your doctor about any unusual side effects or symptoms. A medicine can be given to help lower your blood levels of this medicine more quickly. ?Women must use effective birth control with this medicine. There is a potential for serious side effects to an unborn child. Do not become pregnant while  taking this medicine. Inform your doctor if you wish to become pregnant. This medicine remains in your blood after you stop taking  it. You must continue using effective birth control until the blood levels have been checked and they are low enough. A medicine can be given to help lower your blood levels of this medicine more quickly. Immediately talk to your doctor if you think you may be pregnant. You may need a pregnancy test. Talk to your health care provider or pharmacist for more information. ?You should not receive certain vaccines during your treatment and for a certain time after your treatment with this medication ends. Talk to your health care provider for more information. ?What side effects may I notice from receiving this medication? ?Side effects that you should report to your doctor or health care professional as soon as possible: ?allergic reactions like skin rash, itching or hives, swelling of the face, lips, or tongue ?breathing problems ?cough ?increased blood pressure ?low blood counts - this medicine may decrease the number of white blood cells and platelets. You may be at increased risk for infections and bleeding. ?pain, tingling, numbness in the hands or feet ?rash, fever, and swollen lymph nodes ?redness, blistering, peeing or loosening of the skin, including inside the mouth ?signs of decreased platelets or bleeding - bruising, pinpoint red spots on the skin, black, tarry stools, blood in urine ?signs of infection - fever or chills, cough, sore throat, pain or trouble passing urine ?signs and symptoms of liver injury like dark yellow or brown urine; general ill feeling or flu-like symptoms; light-colored stools; loss of appetite; nausea; right upper belly pain; unusually weak or tired; yellowing of the eyes or skin ?trouble passing urine or change in the amount of urine ?vomiting ?Side effects that usually do not require medical attention (report to your doctor or health care professional if they continue or are bothersome): ?diarrhea ?hair thinning or loss ?headache ?nausea ?tiredness ?This list may not describe all  possible side effects. Call your doctor for medical advice about side effects. You may report side effects to FDA at 1-800-FDA-1088. ?Where should I keep my medication? ?Keep out of the reach of children. ?Store at room temperature between 15 and 30 degrees C (59 and 86 degrees F). Protect from moisture and light. Throw away any unused medicine after the expiration date. ?NOTE: This sheet is a summary. It may not cover all possible information. If you have questions about this medicine, talk to your doctor, pharmacist, or health care provider. ?? 2023 [employer] Standard (2019-03-11 00:00:00) ? ?

## 2022-04-22 LAB — CBC WITH DIFFERENTIAL/PLATELET
Absolute Monocytes: 330 cells/uL (ref 200–950)
Basophils Absolute: 30 cells/uL (ref 0–200)
Basophils Relative: 0.5 %
Eosinophils Absolute: 289 cells/uL (ref 15–500)
Eosinophils Relative: 4.9 %
HCT: 35.6 % (ref 35.0–45.0)
Hemoglobin: 12 g/dL (ref 11.7–15.5)
Lymphs Abs: 1351 cells/uL (ref 850–3900)
MCH: 28.4 pg (ref 27.0–33.0)
MCHC: 33.7 g/dL (ref 32.0–36.0)
MCV: 84.2 fL (ref 80.0–100.0)
MPV: 9.2 fL (ref 7.5–12.5)
Monocytes Relative: 5.6 %
Neutro Abs: 3900 cells/uL (ref 1500–7800)
Neutrophils Relative %: 66.1 %
Platelets: 209 10*3/uL (ref 140–400)
RBC: 4.23 10*6/uL (ref 3.80–5.10)
RDW: 12.4 % (ref 11.0–15.0)
Total Lymphocyte: 22.9 %
WBC: 5.9 10*3/uL (ref 3.8–10.8)

## 2022-04-22 LAB — COMPLETE METABOLIC PANEL WITH GFR
AG Ratio: 1.3 (calc) (ref 1.0–2.5)
ALT: 9 U/L (ref 6–29)
AST: 12 U/L (ref 10–35)
Albumin: 3.9 g/dL (ref 3.6–5.1)
Alkaline phosphatase (APISO): 112 U/L (ref 31–125)
BUN: 10 mg/dL (ref 7–25)
CO2: 27 mmol/L (ref 20–32)
Calcium: 8.6 mg/dL (ref 8.6–10.2)
Chloride: 103 mmol/L (ref 98–110)
Creat: 0.74 mg/dL (ref 0.50–0.99)
Globulin: 2.9 g/dL (calc) (ref 1.9–3.7)
Glucose, Bld: 84 mg/dL (ref 65–99)
Potassium: 4.1 mmol/L (ref 3.5–5.3)
Sodium: 138 mmol/L (ref 135–146)
Total Bilirubin: 0.3 mg/dL (ref 0.2–1.2)
Total Protein: 6.8 g/dL (ref 6.1–8.1)
eGFR: 102 mL/min/{1.73_m2} (ref >=60)

## 2022-04-22 LAB — SEDIMENTATION RATE: Sed Rate: 17 mm/h (ref 0–20)

## 2022-04-22 NOTE — Progress Notes (Signed)
Lab results look fine for starting the leflunomide as discussed.

## 2022-04-24 DIAGNOSIS — F902 Attention-deficit hyperactivity disorder, combined type: Secondary | ICD-10-CM | POA: Diagnosis not present

## 2022-04-24 DIAGNOSIS — F419 Anxiety disorder, unspecified: Secondary | ICD-10-CM | POA: Diagnosis not present

## 2022-04-24 DIAGNOSIS — F3161 Bipolar disorder, current episode mixed, mild: Secondary | ICD-10-CM | POA: Diagnosis not present

## 2022-04-29 ENCOUNTER — Encounter: Payer: Self-pay | Admitting: Physical Medicine and Rehabilitation

## 2022-05-05 ENCOUNTER — Other Ambulatory Visit: Payer: Self-pay | Admitting: Internal Medicine

## 2022-05-05 DIAGNOSIS — M329 Systemic lupus erythematosus, unspecified: Secondary | ICD-10-CM

## 2022-05-05 DIAGNOSIS — I5042 Chronic combined systolic (congestive) and diastolic (congestive) heart failure: Secondary | ICD-10-CM

## 2022-06-03 ENCOUNTER — Ambulatory Visit (INDEPENDENT_AMBULATORY_CARE_PROVIDER_SITE_OTHER): Payer: Medicaid Other | Admitting: Internal Medicine

## 2022-06-03 ENCOUNTER — Encounter: Payer: Self-pay | Admitting: Internal Medicine

## 2022-06-03 VITALS — BP 114/75 | HR 70 | Resp 12 | Ht 62.0 in | Wt 168.0 lb

## 2022-06-03 DIAGNOSIS — M25561 Pain in right knee: Secondary | ICD-10-CM | POA: Diagnosis not present

## 2022-06-03 DIAGNOSIS — Z79899 Other long term (current) drug therapy: Secondary | ICD-10-CM

## 2022-06-03 DIAGNOSIS — M79641 Pain in right hand: Secondary | ICD-10-CM

## 2022-06-03 DIAGNOSIS — M329 Systemic lupus erythematosus, unspecified: Secondary | ICD-10-CM | POA: Diagnosis not present

## 2022-06-03 DIAGNOSIS — M25562 Pain in left knee: Secondary | ICD-10-CM

## 2022-06-03 DIAGNOSIS — M79642 Pain in left hand: Secondary | ICD-10-CM | POA: Diagnosis not present

## 2022-06-03 DIAGNOSIS — G8929 Other chronic pain: Secondary | ICD-10-CM | POA: Diagnosis not present
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
## 2022-06-08 ENCOUNTER — Other Ambulatory Visit: Payer: Self-pay | Admitting: Internal Medicine

## 2022-06-08 DIAGNOSIS — I5042 Chronic combined systolic (congestive) and diastolic (congestive) heart failure: Secondary | ICD-10-CM

## 2022-06-08 DIAGNOSIS — M329 Systemic lupus erythematosus, unspecified: Secondary | ICD-10-CM

## 2022-06-09 NOTE — Telephone Encounter (Signed)
Next Visit: 09/03/2022  Last Visit: 06/03/2022  Last Fill: 04/21/2022  DX:   Current Dose per office note 06/03/2022: not discussed  Labs: 04/21/2022 Lab results look fine for starting the leflunomide as discussed.  Okay to refill Arava and Lasix?

## 2022-06-11 DIAGNOSIS — F419 Anxiety disorder, unspecified: Secondary | ICD-10-CM | POA: Diagnosis not present

## 2022-06-11 DIAGNOSIS — F902 Attention-deficit hyperactivity disorder, combined type: Secondary | ICD-10-CM | POA: Diagnosis not present

## 2022-06-11 DIAGNOSIS — F3161 Bipolar disorder, current episode mixed, mild: Secondary | ICD-10-CM | POA: Diagnosis not present

## 2022-06-23 ENCOUNTER — Other Ambulatory Visit: Payer: Self-pay | Admitting: Internal Medicine

## 2022-06-23 DIAGNOSIS — I5042 Chronic combined systolic (congestive) and diastolic (congestive) heart failure: Secondary | ICD-10-CM

## 2022-07-09 ENCOUNTER — Other Ambulatory Visit: Payer: Self-pay | Admitting: Internal Medicine

## 2022-07-09 DIAGNOSIS — I5042 Chronic combined systolic (congestive) and diastolic (congestive) heart failure: Secondary | ICD-10-CM

## 2022-07-09 NOTE — Telephone Encounter (Signed)
Next Visit: 09/03/2022  Last Visit:  06/03/2022  Last Fill:06/09/2022  Dx: for systemic lupus   Current Dose per office note on 06/03/2022: not mentioned  Okay to refill Lasix?

## 2022-07-13 ENCOUNTER — Other Ambulatory Visit: Payer: Self-pay | Admitting: Internal Medicine

## 2022-07-13 DIAGNOSIS — K219 Gastro-esophageal reflux disease without esophagitis: Secondary | ICD-10-CM

## 2022-07-14 NOTE — Telephone Encounter (Signed)
Next Visit: 09/03/2022  Last Visit: 06/03/2022  Last Fill: 04/21/2022  Current Dose per office note on 06/03/2022: not discussed  Okay to refill Protonix?

## 2022-07-22 DIAGNOSIS — J3489 Other specified disorders of nose and nasal sinuses: Secondary | ICD-10-CM | POA: Diagnosis not present
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
## 2022-08-29 ENCOUNTER — Encounter
Payer: Medicaid Other | Attending: Physical Medicine and Rehabilitation | Admitting: Physical Medicine and Rehabilitation

## 2022-09-03 ENCOUNTER — Ambulatory Visit: Payer: Medicaid Other | Attending: Internal Medicine | Admitting: Internal Medicine

## 2022-10-09 ENCOUNTER — Other Ambulatory Visit: Payer: Self-pay | Admitting: Internal Medicine

## 2022-10-09 DIAGNOSIS — I5042 Chronic combined systolic (congestive) and diastolic (congestive) heart failure: Secondary | ICD-10-CM

## 2022-10-09 DIAGNOSIS — M329 Systemic lupus erythematosus, unspecified: Secondary | ICD-10-CM

## 2022-10-09 NOTE — Telephone Encounter (Signed)
Next Visit: Due around 09/03/2022. Message sent to the front to schedule.  Last Visit: 06/03/2022  Last Fill: 06/09/2022 Leflunomide 07/10/2022 Furosemide  DX: Lupus  Current Dose per office note 06/03/2022: dosages not discussed  Labs: 04/21/2022 Lab results look fine for starting the leflunomide as discussed.  Attempted to call patient to let her know she is due for labs/follow-up visit. Unable to leave message because her mailbox is full.  Okay to refill leflunomide and furosemide?

## 2022-10-09 NOTE — Telephone Encounter (Signed)
Need to reschedule a new follow up, will refill Rx for 1 month only.

## 2022-10-09 NOTE — Telephone Encounter (Signed)
Attempted to call patient to schedule follow-up appointment.  Mailbox full / unable to leave a message.

## 2022-10-10 NOTE — Telephone Encounter (Signed)
Patient scheduled for follow-up visit on 11/05/2022.

## 2022-10-22 ENCOUNTER — Other Ambulatory Visit: Payer: Self-pay | Admitting: Internal Medicine

## 2022-10-22 DIAGNOSIS — I5042 Chronic combined systolic (congestive) and diastolic (congestive) heart failure: Secondary | ICD-10-CM

## 2022-10-22 DIAGNOSIS — M329 Systemic lupus erythematosus, unspecified: Secondary | ICD-10-CM

## 2022-10-31 ENCOUNTER — Other Ambulatory Visit: Payer: Self-pay | Admitting: Internal Medicine

## 2022-10-31 DIAGNOSIS — K219 Gastro-esophageal reflux disease without esophagitis: Secondary | ICD-10-CM

## 2022-11-05 ENCOUNTER — Telehealth: Payer: Self-pay | Admitting: Internal Medicine

## 2022-11-05 ENCOUNTER — Ambulatory Visit: Payer: Medicaid Other | Admitting: Internal Medicine

## 2022-11-05 DIAGNOSIS — M329 Systemic lupus erythematosus, unspecified: Secondary | ICD-10-CM

## 2022-11-05 MED ORDER — PREDNISONE 5 MG PO TABS: ORAL_TABLET | ORAL | 0 refills | Status: AC

## 2022-11-05 NOTE — Telephone Encounter (Signed)
Attempted to return call went to VM and box full. Okay to continue with the hydroxychloroquine. I will send a prednisone taper prescription to her pharmacy for lupus flare up with new skin sores. We can discuss alternative longer term medications if needed when we follow up next month.  If she needs a new prescription for the hydroxychloroquine to continue this till next month we can refill Rx 200 mg PO once daily.

## 2022-11-05 NOTE — Telephone Encounter (Signed)
Patient states she has been experiencing a lupus flair and has sores on her arms and legs.  Patient states she discontinued Leflunomide and went back to Hydroxychloroquine.  Patient requested a return call.

## 2022-11-11 NOTE — Telephone Encounter (Signed)
Attempted to contact the patient. Unable to leave a message, voicemail is full.  

## 2022-11-12 NOTE — Telephone Encounter (Signed)
Attempted to contact the patient. Unable to leave a message, voicemail is full.  

## 2022-11-26 DIAGNOSIS — J020 Streptococcal pharyngitis: Secondary | ICD-10-CM | POA: Diagnosis not present

## 2022-11-26 DIAGNOSIS — Z1152 Encounter for screening for COVID-19: Secondary | ICD-10-CM | POA: Diagnosis not present
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
## 2022-12-03 ENCOUNTER — Encounter: Payer: Self-pay | Admitting: Internal Medicine

## 2022-12-03 ENCOUNTER — Ambulatory Visit: Payer: Medicaid Other | Attending: Internal Medicine | Admitting: Internal Medicine

## 2022-12-03 VITALS — BP 132/73 | HR 85 | Resp 16 | Ht 61.5 in | Wt 152.0 lb

## 2022-12-03 DIAGNOSIS — G8929 Other chronic pain: Secondary | ICD-10-CM

## 2022-12-03 DIAGNOSIS — Z79899 Other long term (current) drug therapy: Secondary | ICD-10-CM | POA: Diagnosis not present

## 2022-12-03 DIAGNOSIS — K219 Gastro-esophageal reflux disease without esophagitis: Secondary | ICD-10-CM

## 2022-12-03 DIAGNOSIS — M79641 Pain in right hand: Secondary | ICD-10-CM

## 2022-12-03 DIAGNOSIS — M25561 Pain in right knee: Secondary | ICD-10-CM

## 2022-12-03 DIAGNOSIS — M79642 Pain in left hand: Secondary | ICD-10-CM | POA: Diagnosis not present

## 2022-12-03 DIAGNOSIS — M329 Systemic lupus erythematosus, unspecified: Secondary | ICD-10-CM | POA: Diagnosis not present

## 2022-12-03 DIAGNOSIS — M25562 Pain in left knee: Secondary | ICD-10-CM

## 2022-12-03 DIAGNOSIS — M5441 Lumbago with sciatica, right side: Secondary | ICD-10-CM | POA: Diagnosis not present

## 2022-12-03 MED ORDER — PANTOPRAZOLE SODIUM 40 MG PO TBEC: 40.0000 mg | DELAYED_RELEASE_TABLET | Freq: Every day | ORAL | 0 refills | Status: DC

## 2022-12-03 MED ORDER — HYDROXYCHLOROQUINE SULFATE 200 MG PO TABS: 200.0000 mg | ORAL_TABLET | Freq: Every day | ORAL | 0 refills | Status: DC

## 2022-12-03 MED ORDER — PREDNISONE 10 MG PO TABS: ORAL_TABLET | ORAL | 0 refills | Status: DC

## 2022-12-03 MED ORDER — CYCLOBENZAPRINE HCL 10 MG PO TABS: ORAL_TABLET | ORAL | 0 refills | Status: DC

## 2022-12-03 MED ORDER — CLOBETASOL PROPIONATE 0.05 % EX OINT: 1.0000 | TOPICAL_OINTMENT | Freq: Every day | CUTANEOUS | 6 refills | Status: AC | PRN

## 2022-12-05 LAB — CBC WITH DIFFERENTIAL/PLATELET
Absolute Monocytes: 568 cells/uL (ref 200–950)
Basophils Absolute: 35 cells/uL (ref 0–200)
Basophils Relative: 0.3 %
Eosinophils Absolute: 116 cells/uL (ref 15–500)
Eosinophils Relative: 1 %
HCT: 37.5 % (ref 35.0–45.0)
Hemoglobin: 12.4 g/dL (ref 11.7–15.5)
Lymphs Abs: 2030 cells/uL (ref 850–3900)
MCH: 27.8 pg (ref 27.0–33.0)
MCHC: 33.1 g/dL (ref 32.0–36.0)
MCV: 84.1 fL (ref 80.0–100.0)
MPV: 8.9 fL (ref 7.5–12.5)
Monocytes Relative: 4.9 %
Neutro Abs: 8851 cells/uL — ABNORMAL HIGH (ref 1500–7800)
Neutrophils Relative %: 76.3 %
Platelets: 273 10*3/uL (ref 140–400)
RBC: 4.46 10*6/uL (ref 3.80–5.10)
RDW: 12.1 % (ref 11.0–15.0)
Total Lymphocyte: 17.5 %
WBC: 11.6 10*3/uL — ABNORMAL HIGH (ref 3.8–10.8)

## 2022-12-05 LAB — C3 AND C4
C3 Complement: 120 mg/dL (ref 83–193)
C4 Complement: 32 mg/dL (ref 15–57)

## 2022-12-05 LAB — COMPLETE METABOLIC PANEL WITH GFR
AG Ratio: 1.3 (calc) (ref 1.0–2.5)
ALT: 8 U/L (ref 6–29)
AST: 9 U/L — ABNORMAL LOW (ref 10–35)
Albumin: 4 g/dL (ref 3.6–5.1)
Alkaline phosphatase (APISO): 99 U/L (ref 31–125)
BUN: 15 mg/dL (ref 7–25)
CO2: 30 mmol/L (ref 20–32)
Calcium: 8.9 mg/dL (ref 8.6–10.2)
Chloride: 100 mmol/L (ref 98–110)
Creat: 0.77 mg/dL (ref 0.50–0.99)
Globulin: 3.1 g/dL (calc) (ref 1.9–3.7)
Glucose, Bld: 124 mg/dL — ABNORMAL HIGH (ref 65–99)
Potassium: 4.3 mmol/L (ref 3.5–5.3)
Sodium: 139 mmol/L (ref 135–146)
Total Bilirubin: 0.3 mg/dL (ref 0.2–1.2)
Total Protein: 7.1 g/dL (ref 6.1–8.1)
eGFR: 96 mL/min/{1.73_m2} (ref >=60)

## 2022-12-05 LAB — SEDIMENTATION RATE: Sed Rate: 25 mm/h — ABNORMAL HIGH (ref 0–20)

## 2022-12-05 LAB — SJOGRENS SYNDROME-A EXTRACTABLE NUCLEAR ANTIBODY: SSA (Ro) (ENA) Antibody, IgG: 4.8 AI — AB

## 2022-12-05 LAB — C-REACTIVE PROTEIN: CRP: 10.8 mg/L — ABNORMAL HIGH (ref <8.0)

## 2022-12-05 LAB — QUANTIFERON-TB GOLD PLUS
Mitogen-NIL: >10 IU/mL
NIL: 0.04 IU/mL
QuantiFERON-TB Gold Plus: NEGATIVE
TB1-NIL: 0 IU/mL
TB2-NIL: <0 IU/mL

## 2022-12-05 LAB — HEPATITIS B CORE ANTIBODY, IGM: Hep B C IgM: NONREACTIVE

## 2022-12-05 LAB — HEPATITIS B SURFACE ANTIGEN: Hepatitis B Surface Ag: NONREACTIVE

## 2022-12-05 LAB — HEPATITIS C ANTIBODY: Hepatitis C Ab: NONREACTIVE

## 2022-12-18 DIAGNOSIS — F902 Attention-deficit hyperactivity disorder, combined type: Secondary | ICD-10-CM | POA: Diagnosis not present

## 2022-12-18 DIAGNOSIS — F419 Anxiety disorder, unspecified: Secondary | ICD-10-CM | POA: Diagnosis not present

## 2022-12-18 DIAGNOSIS — F3161 Bipolar disorder, current episode mixed, mild: Secondary | ICD-10-CM | POA: Diagnosis not present
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
## 2023-01-13 ENCOUNTER — Other Ambulatory Visit: Payer: Self-pay | Admitting: Internal Medicine

## 2023-01-13 ENCOUNTER — Encounter: Payer: Self-pay | Admitting: Internal Medicine

## 2023-01-13 ENCOUNTER — Telehealth: Payer: Self-pay | Admitting: Pharmacist

## 2023-01-13 ENCOUNTER — Ambulatory Visit: Payer: Medicaid Other | Attending: Internal Medicine | Admitting: Internal Medicine

## 2023-01-13 VITALS — BP 167/106 | HR 80 | Ht 61.0 in | Wt 151.0 lb

## 2023-01-13 DIAGNOSIS — M329 Systemic lupus erythematosus, unspecified: Secondary | ICD-10-CM | POA: Diagnosis not present

## 2023-01-13 DIAGNOSIS — Z79899 Other long term (current) drug therapy: Secondary | ICD-10-CM

## 2023-01-13 DIAGNOSIS — M5441 Lumbago with sciatica, right side: Secondary | ICD-10-CM

## 2023-01-13 DIAGNOSIS — G8929 Other chronic pain: Secondary | ICD-10-CM | POA: Diagnosis not present

## 2023-01-13 DIAGNOSIS — J3489 Other specified disorders of nose and nasal sinuses: Secondary | ICD-10-CM | POA: Diagnosis not present

## 2023-01-13 MED ORDER — PREDNISONE 10 MG PO TABS: 10.0000 mg | ORAL_TABLET | Freq: Every day | ORAL | 1 refills | Status: DC | PRN

## 2023-01-13 NOTE — Telephone Encounter (Addendum)
Please start Benlysta SQ BIV pending OV note from 01/13/2023 to be signed  Does: 200mg  SQ every 7 days  Knox Saliva, PharmD, MPH, BCPS, CPP Clinical Pharmacist (Rheumatology and Pulmonology)  ----- Message from Shona Needles, RT sent at 01/13/2023  3:09 PM EST ----- Regarding: NEW START BENLYSTA

## 2023-01-14 LAB — C-REACTIVE PROTEIN: CRP: 2.4 mg/L (ref <8.0)

## 2023-01-14 LAB — SEDIMENTATION RATE: Sed Rate: 22 mm/h — ABNORMAL HIGH (ref 0–20)

## 2023-01-14 NOTE — Telephone Encounter (Signed)
-----  Message from Collier Salina, MD sent at 01/14/2023  1:55 AM EST ----- Regarding: Christina Morrison see [name] sent you a message and starting process for [facility]. [name] has had a a lot of difficulty in adherence with some of her at home medications and would prefer infusion treatment option if possible. If that can't be done would need to update her about switching to [facility].

## 2023-01-14 NOTE — Telephone Encounter (Addendum)
Referral to [facility] prepped for Benlysta IV. Pending provider signature.  Knox Saliva, PharmD, MPH, BCPS, CPP Clinical Pharmacist (Rheumatology and Pulmonology)

## 2023-01-19 ENCOUNTER — Other Ambulatory Visit (HOSPITAL_COMMUNITY): Payer: Self-pay

## 2023-01-19 NOTE — Telephone Encounter (Signed)
Referral to [facility] placed for Benlysta IV infusions  Dose: 10mg /kg at Week 0, Week 2, Week 4 then every 4 weeks thereafter.  Labs: CBC every 3 months  Premedications: diphenhydramine 25mg  PO and acetaminophen 650mg  PO  [facility] fax: [place] phone: [phone]  Knox Saliva, PharmD, MPH, BCPS, CPP Clinical Pharmacist (Rheumatology and Pulmonology)

## 2023-01-28 ENCOUNTER — Other Ambulatory Visit (HOSPITAL_COMMUNITY): Payer: Self-pay

## 2023-01-28 NOTE — Telephone Encounter (Signed)
Called [facility] for update on patient's Benlysta IV infusion referral. Per case manager Shelda Jakes, her [employer] Scheuermann plan is no longer active. I provided [employer] ID number from Rx eligibility check but she states that this wouldn't be correct ID # for medical benefit. She states she has contacted patient multiple times and her VM box is full. She has also sent text message - pt has not responded. ATC patient today - unable to reach and unable to leave VM. Insurance cards are not scanned into patient's chart either.  Phone: [phone] Direct phone: [phone], Shelda Jakes (case manager)  Knox Saliva, PharmD, MPH, BCPS, CPP Clinical Pharmacist (Rheumatology and Pulmonology)

## 2023-01-29 NOTE — Telephone Encounter (Addendum)
ATC patient from rheumatology clinic. Phone was picked up and then hung up. Letter has been sent today.  Will f/u with [facility] on 02/12/2023  Knox Saliva, PharmD, MPH, BCPS, CPP Clinical Pharmacist (Rheumatology and Pulmonology)

## 2023-02-27 NOTE — Telephone Encounter (Signed)
Received email from [facility] that they are unable to provide services for this patient's [place]. "She has Astr[employer] and [employer] as a secondary. We Scientist, research (physical sciences) for medical benefits and bill Medicaid for pharmacy benefits. We are unable to bill both for different benefits".  Will place referral to [place], PharmD, MPH, BCPS, CPP Clinical Pharmacist (Rheumatology and Pulmonology)

## 2023-03-03 NOTE — Telephone Encounter (Signed)
Referral to [facility] placed for IV Benlysta for [facility] location infusion center  Phone [phone] Fax: [phone]  Knox Saliva, PharmD, MPH, BCPS, CPP Clinical Pharmacist (Rheumatology and Pulmonology)

## 2023-03-06 ENCOUNTER — Other Ambulatory Visit: Payer: Self-pay | Admitting: Internal Medicine

## 2023-03-06 DIAGNOSIS — M329 Systemic lupus erythematosus, unspecified: Secondary | ICD-10-CM

## 2023-03-09 NOTE — Telephone Encounter (Signed)
Last Fill: 11/26/2021  Next Visit: 03/17/2023  Last Visit: 01/13/2023  Dx: Lupus ([place]), Nausea and Vomiting   Current Dose per office note on 01/13/2023: not mentioned  Okay to refill Zofran?

## 2023-03-11 DIAGNOSIS — J3489 Other specified disorders of nose and nasal sinuses: Secondary | ICD-10-CM | POA: Diagnosis not present

## 2023-03-11 DIAGNOSIS — M329 Systemic lupus erythematosus, unspecified: Secondary | ICD-10-CM | POA: Diagnosis not present

## 2023-03-13 DIAGNOSIS — M329 Systemic lupus erythematosus, unspecified: Secondary | ICD-10-CM | POA: Diagnosis not present

## 2023-03-13 DIAGNOSIS — J3489 Other specified disorders of nose and nasal sinuses: Secondary | ICD-10-CM | POA: Diagnosis not present
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
## 2023-03-17 ENCOUNTER — Encounter: Payer: Self-pay | Admitting: Internal Medicine

## 2023-03-17 ENCOUNTER — Telehealth: Payer: Self-pay

## 2023-03-17 ENCOUNTER — Ambulatory Visit: Payer: Medicaid Other | Attending: Internal Medicine | Admitting: Internal Medicine

## 2023-03-17 VITALS — BP 128/83 | HR 90 | Resp 14 | Ht 61.0 in | Wt 155.0 lb

## 2023-03-17 DIAGNOSIS — M79641 Pain in right hand: Secondary | ICD-10-CM

## 2023-03-17 DIAGNOSIS — Z79899 Other long term (current) drug therapy: Secondary | ICD-10-CM | POA: Diagnosis not present

## 2023-03-17 DIAGNOSIS — M329 Systemic lupus erythematosus, unspecified: Secondary | ICD-10-CM | POA: Diagnosis not present

## 2023-03-17 DIAGNOSIS — M79642 Pain in left hand: Secondary | ICD-10-CM | POA: Diagnosis not present

## 2023-03-17 MED ORDER — PREDNISONE 5 MG PO TABS: 5.0000 mg | ORAL_TABLET | Freq: Every day | ORAL | 2 refills | Status: DC

## 2023-03-17 MED ORDER — HYDROXYCHLOROQUINE SULFATE 200 MG PO TABS: 200.0000 mg | ORAL_TABLET | Freq: Every day | ORAL | 0 refills | Status: DC

## 2023-03-17 NOTE — Telephone Encounter (Signed)
Received fax frox [employer] stating IV Benlysta [phone]) has been approved for 14 unites from 03/06/23 to 06/04/2023.  Ref # [id number]  Approval letter faxed to [facility] for further processing of Benlysta referral  Knox Saliva, PharmD, MPH, BCPS, CPP Clinical Pharmacist (Rheumatology and Pulmonology)

## 2023-03-17 NOTE — Telephone Encounter (Signed)
Christina Morrison from [facility] contacted the office and states they received a denial for the patient's Benlysta on 03/12/2023. [name] inquires how they should proceed with the medication. The call back number is [phone], ext [phone]. Please advise. Thanks.

## 2023-03-18 LAB — COMPLETE METABOLIC PANEL WITH GFR
AG Ratio: 1.4 (calc) (ref 1.0–2.5)
ALT: 9 U/L (ref 6–29)
AST: 13 U/L (ref 10–35)
Albumin: 4 g/dL (ref 3.6–5.1)
Alkaline phosphatase (APISO): 85 U/L (ref 31–125)
BUN: 8 mg/dL (ref 7–25)
CO2: 29 mmol/L (ref 20–32)
Calcium: 8.6 mg/dL (ref 8.6–10.2)
Chloride: 101 mmol/L (ref 98–110)
Creat: 0.65 mg/dL (ref 0.50–0.99)
Globulin: 2.8 g/dL (calc) (ref 1.9–3.7)
Glucose, Bld: 72 mg/dL (ref 65–99)
Potassium: 4 mmol/L (ref 3.5–5.3)
Sodium: 136 mmol/L (ref 135–146)
Total Bilirubin: 0.5 mg/dL (ref 0.2–1.2)
Total Protein: 6.8 g/dL (ref 6.1–8.1)
eGFR: 110 mL/min/{1.73_m2} (ref >=60)

## 2023-03-18 LAB — CBC WITH DIFFERENTIAL/PLATELET
Absolute Monocytes: 446 cells/uL (ref 200–950)
Basophils Absolute: 22 cells/uL (ref 0–200)
Basophils Relative: 0.3 %
Eosinophils Absolute: 238 cells/uL (ref 15–500)
Eosinophils Relative: 3.3 %
HCT: 36.2 % (ref 35.0–45.0)
Hemoglobin: 12.2 g/dL (ref 11.7–15.5)
Lymphs Abs: 1440 cells/uL (ref 850–3900)
MCH: 28.3 pg (ref 27.0–33.0)
MCHC: 33.7 g/dL (ref 32.0–36.0)
MCV: 84 fL (ref 80.0–100.0)
MPV: 9.1 fL (ref 7.5–12.5)
Monocytes Relative: 6.2 %
Neutro Abs: 5054 cells/uL (ref 1500–7800)
Neutrophils Relative %: 70.2 %
Platelets: 219 10*3/uL (ref 140–400)
RBC: 4.31 10*6/uL (ref 3.80–5.10)
RDW: 13 % (ref 11.0–15.0)
Total Lymphocyte: 20 %
WBC: 7.2 10*3/uL (ref 3.8–10.8)

## 2023-03-18 LAB — SEDIMENTATION RATE: Sed Rate: 17 mm/h (ref 0–20)

## 2023-03-18 NOTE — Telephone Encounter (Signed)
Patient's referral at [facility] infusion was supposed to be closed out as our Passenger transport manager stated they could not service her based on coordination of insurance benefits. I have emailed him back and requested service be closed out since another site of care is processing insurance.  Knox Saliva, PharmD, MPH, BCPS, CPP Clinical Pharmacist (Rheumatology and Pulmonology)

## 2023-03-20 NOTE — Telephone Encounter (Signed)
Called [facility] for status update on IV Benlysta referral. Per McKinzie, patient is on list for outreach for [facility] to schedule first infusion  Chesley Mires, PharmD, MPH, BCPS, CPP Clinical Pharmacist (Rheumatology and Pulmonology)

## 2023-03-24 NOTE — Telephone Encounter (Signed)
Received fax from [facility] infusion that patient cancelled IV Benlysta appt that was scheduled for 03/26/23. She is re-scheduled for 04/21/2023  Chesley Mires, PharmD, MPH, BCPS, CPP Clinical Pharmacist (Rheumatology and Pulmonology)

## 2023-03-28 ENCOUNTER — Other Ambulatory Visit: Payer: Self-pay | Admitting: Internal Medicine

## 2023-03-28 DIAGNOSIS — K219 Gastro-esophageal reflux disease without esophagitis: Secondary | ICD-10-CM

## 2023-03-30 NOTE — Telephone Encounter (Signed)
Last Fill: 12/03/2022  Next Visit: 06/16/2023  Last Visit: 03/17/2023  Dx:  Lupus   Current Dose per office note on 03/17/2023: not mentioned  Okay to refill Protonix?

## 2023-04-01 DIAGNOSIS — M3210 Systemic lupus erythematosus, organ or system involvement unspecified: Secondary | ICD-10-CM | POA: Diagnosis not present

## 2023-04-02 NOTE — Telephone Encounter (Signed)
Received fax from [facility] regarding first IV BENLYSTA infusion that patient received on 04/01/23. Labs were drawn - CBC w diff  Patient tolerated infusion without complications.   Next IV BENLYSTA infusion scheduled for 04/15/2023  Chesley Mires, PharmD, MPH, BCPS, CPP Clinical Pharmacist (Rheumatology and Pulmonology)

## 2023-04-07 DIAGNOSIS — F419 Anxiety disorder, unspecified: Secondary | ICD-10-CM | POA: Diagnosis not present

## 2023-04-07 DIAGNOSIS — F902 Attention-deficit hyperactivity disorder, combined type: Secondary | ICD-10-CM | POA: Diagnosis not present

## 2023-04-07 DIAGNOSIS — F3161 Bipolar disorder, current episode mixed, mild: Secondary | ICD-10-CM | POA: Diagnosis not present

## 2023-04-14 ENCOUNTER — Telehealth: Payer: Self-pay | Admitting: Internal Medicine

## 2023-04-14 NOTE — Telephone Encounter (Signed)
Patient called stating she has left multiple messages at [facility] to confirm the time of her appointment tomorrow 04/15/23.  Patient states the message on their answering machine states if you have any questions to call her referring physician.  Patient requested a return call.

## 2023-04-14 NOTE — Telephone Encounter (Signed)
I called [facility] to confirm appt time, appointment is at 2pm on 04/15/2023. I called patient and advised. Patient verbalized understanding.

## 2023-04-15 ENCOUNTER — Telehealth: Payer: Self-pay | Admitting: Pharmacist

## 2023-04-15 DIAGNOSIS — M3210 Systemic lupus erythematosus, organ or system involvement unspecified: Secondary | ICD-10-CM | POA: Diagnosis not present

## 2023-04-15 NOTE — Telephone Encounter (Signed)
Received fax from [facility] regarding Benlysta infusion that patient received on 04/15/2023. Labs were not drawn.  Patient tolerated infusion without complications.  Changes/concerns since last visit: none  Next Benlysta infusion scheduled for 04/29/2023

## 2023-04-29 DIAGNOSIS — M3210 Systemic lupus erythematosus, organ or system involvement unspecified: Secondary | ICD-10-CM | POA: Diagnosis not present

## 2023-04-30 ENCOUNTER — Telehealth: Payer: Self-pay | Admitting: Pharmacist

## 2023-04-30 NOTE — Telephone Encounter (Signed)
Received fax from [facility] regarding BENLYSTA infusion that patient received on 04/29/23. Labs were not drawn.  Patient tolerated infusion without complications.  Changes/concerns since last visit: none  Next BENLYSTA infusion scheduled for 05/27/23  Chesley Mires, PharmD, MPH, BCPS, CPP Clinical Pharmacist (Rheumatology and Pulmonology)

## 2023-05-11 ENCOUNTER — Other Ambulatory Visit: Payer: Self-pay | Admitting: Internal Medicine

## 2023-05-11 DIAGNOSIS — M329 Systemic lupus erythematosus, unspecified: Secondary | ICD-10-CM

## 2023-05-12 NOTE — Telephone Encounter (Signed)
Last Fill: 03/17/2023  Next Visit: 06/10/2023  Last Visit: 03/17/2023  Dx:  Lupus    Current Dose per office note on 03/17/2023: predniSONE (DELTASONE) 5 MG table   Okay to refill Prednisone?

## 2023-05-27 DIAGNOSIS — M3210 Systemic lupus erythematosus, organ or system involvement unspecified: Secondary | ICD-10-CM | POA: Diagnosis not present

## 2023-05-28 ENCOUNTER — Telehealth: Payer: Self-pay | Admitting: Pharmacist

## 2023-05-28 NOTE — Telephone Encounter (Signed)
Received fax from [facility] regarding BENLYSTA infusion that patient received on 05/27/23. Labs were not drawn.  Patient tolerated infusion without complications.  Changes/concerns since last visit: none  Next BENLYSTA infusion scheduled for 06/24/2023  Chesley Mires, PharmD, MPH, BCPS, CPP Clinical Pharmacist (Rheumatology and Pulmonology)

## 2023-06-02 ENCOUNTER — Telehealth: Payer: Self-pay | Admitting: Internal Medicine
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
## 2023-06-02 NOTE — Telephone Encounter (Signed)
-----   Message from [facility], RPH-CPP sent at 06/02/2023  3:03 PM EDT ----- Regarding: appt r/s Hello!  This patient's Benlysta infusion authorization expires on 6/202/2024.Is there any way to get her in sooner with Dr Dimple Casey? We need to send updated clinicals as soon as we can to show how she's responding to treatment  Thanks! Devki

## 2023-06-02 NOTE — Telephone Encounter (Signed)
Patient's voicemail full / unable to leave a message.

## 2023-06-03 ENCOUNTER — Ambulatory Visit: Payer: Medicaid Other | Attending: Internal Medicine | Admitting: Internal Medicine

## 2023-06-03 ENCOUNTER — Encounter: Payer: Self-pay | Admitting: Internal Medicine

## 2023-06-03 VITALS — BP 130/87 | HR 91 | Resp 14 | Ht 62.0 in | Wt 153.0 lb

## 2023-06-03 DIAGNOSIS — M329 Systemic lupus erythematosus, unspecified: Secondary | ICD-10-CM

## 2023-06-03 DIAGNOSIS — M79642 Pain in left hand: Secondary | ICD-10-CM | POA: Diagnosis not present

## 2023-06-03 DIAGNOSIS — R29898 Other symptoms and signs involving the musculoskeletal system: Secondary | ICD-10-CM

## 2023-06-03 DIAGNOSIS — Z79899 Other long term (current) drug therapy: Secondary | ICD-10-CM

## 2023-06-03 DIAGNOSIS — M79641 Pain in right hand: Secondary | ICD-10-CM

## 2023-06-03 LAB — CBC WITH DIFFERENTIAL/PLATELET
Basophils Absolute: 18 cells/uL (ref 0–200)
MCH: 28.4 pg (ref 27.0–33.0)
MCHC: 33.9 g/dL (ref 32.0–36.0)
Platelets: 230 10*3/uL (ref 140–400)
RBC: 4.26 10*6/uL (ref 3.80–5.10)

## 2023-06-04 ENCOUNTER — Telehealth: Payer: Self-pay | Admitting: Pharmacist

## 2023-06-04 LAB — PROTEIN / CREATININE RATIO, URINE
Creatinine, Urine: 114 mg/dL (ref 20–275)
Protein/Creat Ratio: 61 mg/g creat (ref 24–184)
Protein/Creatinine Ratio: 0.061 mg/mg creat (ref 0.024–0.184)
Total Protein, Urine: 7 mg/dL (ref 5–24)

## 2023-06-04 LAB — COMPLETE METABOLIC PANEL WITH GFR
AG Ratio: 1.6 (calc) (ref 1.0–2.5)
ALT: 8 U/L (ref 6–29)
AST: 13 U/L (ref 10–35)
Albumin: 4.1 g/dL (ref 3.6–5.1)
Alkaline phosphatase (APISO): 82 U/L (ref 31–125)
BUN: 10 mg/dL (ref 7–25)
CO2: 28 mmol/L (ref 20–32)
Calcium: 8.8 mg/dL (ref 8.6–10.2)
Chloride: 101 mmol/L (ref 98–110)
Creat: 0.69 mg/dL (ref 0.50–0.99)
Globulin: 2.6 g/dL (calc) (ref 1.9–3.7)
Glucose, Bld: 99 mg/dL (ref 65–99)
Potassium: 4.4 mmol/L (ref 3.5–5.3)
Sodium: 136 mmol/L (ref 135–146)
Total Bilirubin: 0.3 mg/dL (ref 0.2–1.2)
Total Protein: 6.7 g/dL (ref 6.1–8.1)
eGFR: 108 mL/min/{1.73_m2} (ref >=60)

## 2023-06-04 LAB — CBC WITH DIFFERENTIAL/PLATELET
Absolute Monocytes: 378 cells/uL (ref 200–950)
Basophils Relative: 0.3 %
Eosinophils Absolute: 242 cells/uL (ref 15–500)
Eosinophils Relative: 4.1 %
HCT: 35.7 % (ref 35.0–45.0)
Hemoglobin: 12.1 g/dL (ref 11.7–15.5)
Lymphs Abs: 1227 cells/uL (ref 850–3900)
MCV: 83.8 fL (ref 80.0–100.0)
MPV: 9.7 fL (ref 7.5–12.5)
Monocytes Relative: 6.4 %
Neutro Abs: 4036 cells/uL (ref 1500–7800)
Neutrophils Relative %: 68.4 %
RDW: 12.5 % (ref 11.0–15.0)
Total Lymphocyte: 20.8 %
WBC: 5.9 10*3/uL (ref 3.8–10.8)

## 2023-06-04 LAB — C3 AND C4
C3 Complement: 110 mg/dL (ref 83–193)
C4 Complement: 29 mg/dL (ref 15–57)

## 2023-06-04 LAB — SEDIMENTATION RATE: Sed Rate: 22 mm/h — ABNORMAL HIGH (ref 0–20)

## 2023-06-04 LAB — ANTI-DNA ANTIBODY, DOUBLE-STRANDED: ds DNA Ab: 1 IU/mL

## 2023-06-04 NOTE — Telephone Encounter (Signed)
Received fax from [facility] requesting updated clinicals to re-process Benlysta IV renewal (document is retained in Onbase). OV note from 06/03/23 will need to be sent to infusion center once note is signed by Dr. Laren Everts, PharmD, MPH, BCPS, CPP Clinical Pharmacist (Rheumatology and Pulmonology)

## 2023-06-10 ENCOUNTER — Ambulatory Visit: Payer: Medicaid Other | Admitting: Internal Medicine

## 2023-06-11 NOTE — Telephone Encounter (Signed)
Received a call from [facility] and they left a message stating they need additional notes to renew the patient's Benlysta through her insurance. Christina Morrison states they would like the finished and signed office notes from the patient's visits on 03/17/2023 and 06/03/2023. The office note from 06/03/2023 has not been finished. The call back number is [phone] and the fax number is [phone]. Please advise.

## 2023-06-16 ENCOUNTER — Ambulatory Visit: Payer: Medicaid Other | Admitting: Internal Medicine

## 2023-06-22 NOTE — Telephone Encounter (Signed)
Note is signed, sorry for delay. Added SLEDAI score if that helps.

## 2023-06-23 NOTE — Telephone Encounter (Signed)
OV notes from 06/03/23 and 03/17/23 faxed to [facility].  Fax: [phone]  Chesley Mires, PharmD, MPH, BCPS, CPP Clinical Pharmacist (Rheumatology and Pulmonology)

## 2023-06-24 DIAGNOSIS — M3210 Systemic lupus erythematosus, organ or system involvement unspecified: Secondary | ICD-10-CM | POA: Diagnosis not present

## 2023-06-25 ENCOUNTER — Other Ambulatory Visit: Payer: Self-pay | Admitting: Internal Medicine

## 2023-06-25 ENCOUNTER — Telehealth: Payer: Self-pay | Admitting: Pharmacist

## 2023-06-25 DIAGNOSIS — K219 Gastro-esophageal reflux disease without esophagitis: Secondary | ICD-10-CM

## 2023-06-25 NOTE — Telephone Encounter (Signed)
Last Fill: 03/30/2023  Next Visit: 09/09/2023  Last Visit: 06/03/2023  Current Dose per office note on 06/03/2023: not discussed  Okay to refill Pantoprazole?

## 2023-06-25 NOTE — Telephone Encounter (Signed)
Received fax from [facility] regarding SA infusion that patient received on 06/24/23. Labs were drawn.  Patient tolerated infusion without complications.  Changes/concerns since last visit: none  Next Saphnelo infusion scheduled for 07/22/23  Chesley Mires, PharmD, MPH, BCPS, CPP Clinical Pharmacist (Rheumatology and Pulmonology)

## 2023-07-03 ENCOUNTER — Other Ambulatory Visit: Payer: Self-pay | Admitting: Internal Medicine

## 2023-07-07 ENCOUNTER — Other Ambulatory Visit: Payer: Self-pay | Admitting: Internal Medicine

## 2023-07-10 DIAGNOSIS — F3161 Bipolar disorder, current episode mixed, mild: Secondary | ICD-10-CM | POA: Diagnosis not present

## 2023-07-10 DIAGNOSIS — Z79899 Other long term (current) drug therapy: Secondary | ICD-10-CM | POA: Diagnosis not present

## 2023-07-10 DIAGNOSIS — F902 Attention-deficit hyperactivity disorder, combined type: Secondary | ICD-10-CM | POA: Diagnosis not present

## 2023-07-10 DIAGNOSIS — F419 Anxiety disorder, unspecified: Secondary | ICD-10-CM | POA: Diagnosis not present

## 2023-07-22 DIAGNOSIS — M3210 Systemic lupus erythematosus, organ or system involvement unspecified: Secondary | ICD-10-CM | POA: Diagnosis not present

## 2023-07-23 ENCOUNTER — Telehealth: Payer: Self-pay | Admitting: Pharmacist

## 2023-07-23 NOTE — Telephone Encounter (Signed)
Received fax from [facility] regarding Benlysta infusion that patient received on 07/22/2023. Labs were not drawn.  Patient tolerated infusion without complications.  Changes/concerns since last visit: patient having surgery on nose in September 2024 (endoscopy with septal button placement on 08/21/23?). Patient took Advil 600mg  p.o. prior to infusion. She reports migraines same day as infusion and up to 2 weeks post-infusion. Patient declined hydration bolus offered by nurse because patient did not want to stay for additional time.  Patient reports 10/10 pain (back, knees, R hand, stiffness, "terrible pan")  Next Benlysta infusion scheduled for 08/19/2023  ATC patient to discuss - recommend she delay Benlysta infusion until post-endoscopy. She should also try to stay adequately hydrated prior to infusion. We can place additional order for hydration boluses pre-infusion if she'd prefer. Left VM requesting return call  Chesley Mires, PharmD, MPH, BCPS, CPP Clinical Pharmacist (Rheumatology and Pulmonology)

## 2023-08-12 ENCOUNTER — Other Ambulatory Visit: Payer: Self-pay | Admitting: Internal Medicine

## 2023-08-12 DIAGNOSIS — I5042 Chronic combined systolic (congestive) and diastolic (congestive) heart failure: Secondary | ICD-10-CM

## 2023-08-12 NOTE — Telephone Encounter (Signed)
Last Fill: 10/09/2022  Next Visit: 09/09/2023  Last Visit: 06/03/2023  Dx: Lupus   Current Dose per office note on 06/03/2023: not mentioned  Okay to refill Lasix?

## 2023-08-19 DIAGNOSIS — M3210 Systemic lupus erythematosus, organ or system involvement unspecified: Secondary | ICD-10-CM | POA: Diagnosis not present

## 2023-08-20 ENCOUNTER — Telehealth: Payer: Self-pay | Admitting: Pharmacist

## 2023-08-20 NOTE — Telephone Encounter (Signed)
Received fax from [facility] regarding Benlysta IV [phone]) infusion that patient received on 08/19/23. Dose: 688mg  Labs were not drawn.  Patient tolerated infusion without complications.  Changes/concerns since last visit: none  Next Benlysta infusion scheduled for 09/16/23  Chesley Mires, PharmD, MPH, BCPS, CPP Clinical Pharmacist (Rheumatology and Pulmonology)

## 2023-08-21 DIAGNOSIS — M329 Systemic lupus erythematosus, unspecified: Secondary | ICD-10-CM | POA: Diagnosis not present

## 2023-08-21 DIAGNOSIS — J3489 Other specified disorders of nose and nasal sinuses: Secondary | ICD-10-CM | POA: Diagnosis not present

## 2023-08-21 HISTORY — PX: NOSE SURGERY: SHX723
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
## 2023-09-03 ENCOUNTER — Other Ambulatory Visit: Payer: Self-pay | Admitting: Internal Medicine

## 2023-09-03 DIAGNOSIS — I5042 Chronic combined systolic (congestive) and diastolic (congestive) heart failure: Secondary | ICD-10-CM

## 2023-09-03 NOTE — Telephone Encounter (Signed)
Last Fill: 08/12/2023  Next Visit: 09/09/2023  Last Visit: 06/03/2023  Dx: not mentioned  Current Dose per office note on 06/03/2023: not mentioned  Okay to refill Lasix?

## 2023-09-09 ENCOUNTER — Ambulatory Visit: Payer: Medicaid Other | Attending: Internal Medicine | Admitting: Internal Medicine

## 2023-09-09 ENCOUNTER — Encounter: Payer: Self-pay | Admitting: Internal Medicine

## 2023-09-09 VITALS — BP 131/70 | HR 83 | Resp 14 | Ht 61.0 in | Wt 155.0 lb

## 2023-09-09 DIAGNOSIS — M329 Systemic lupus erythematosus, unspecified: Secondary | ICD-10-CM | POA: Diagnosis not present

## 2023-09-09 DIAGNOSIS — R29898 Other symptoms and signs involving the musculoskeletal system: Secondary | ICD-10-CM

## 2023-09-09 DIAGNOSIS — G8929 Other chronic pain: Secondary | ICD-10-CM | POA: Diagnosis not present

## 2023-09-09 DIAGNOSIS — Z79899 Other long term (current) drug therapy: Secondary | ICD-10-CM

## 2023-09-09 DIAGNOSIS — M79641 Pain in right hand: Secondary | ICD-10-CM

## 2023-09-09 DIAGNOSIS — M5441 Lumbago with sciatica, right side: Secondary | ICD-10-CM | POA: Diagnosis not present

## 2023-09-09 DIAGNOSIS — M79642 Pain in left hand: Secondary | ICD-10-CM

## 2023-09-09 DIAGNOSIS — Z7952 Long term (current) use of systemic steroids: Secondary | ICD-10-CM

## 2023-09-09 DIAGNOSIS — E559 Vitamin D deficiency, unspecified: Secondary | ICD-10-CM

## 2023-09-09 MED ORDER — HYDROXYCHLOROQUINE SULFATE 200 MG PO TABS: 200.0000 mg | ORAL_TABLET | Freq: Every day | ORAL | 0 refills | Status: DC

## 2023-09-09 MED ORDER — CYCLOBENZAPRINE HCL 10 MG PO TABS
ORAL_TABLET | ORAL | 0 refills | Status: DC
Start: 2023-09-09 — End: 2024-02-17

## 2023-09-10 LAB — COMPLETE METABOLIC PANEL WITH GFR
AG Ratio: 1.6 (calc) (ref 1.0–2.5)
ALT: 6 U/L (ref 6–29)
AST: 9 U/L — ABNORMAL LOW (ref 10–35)
Albumin: 4.2 g/dL (ref 3.6–5.1)
Alkaline phosphatase (APISO): 78 U/L (ref 31–125)
BUN: 15 mg/dL (ref 7–25)
CO2: 29 mmol/L (ref 20–32)
Calcium: 9.4 mg/dL (ref 8.6–10.2)
Chloride: 102 mmol/L (ref 98–110)
Creat: 0.82 mg/dL (ref 0.50–0.99)
Globulin: 2.7 g/dL (calc) (ref 1.9–3.7)
Glucose, Bld: 104 mg/dL — ABNORMAL HIGH (ref 65–99)
Potassium: 4.6 mmol/L (ref 3.5–5.3)
Sodium: 139 mmol/L (ref 135–146)
Total Bilirubin: 0.4 mg/dL (ref 0.2–1.2)
Total Protein: 6.9 g/dL (ref 6.1–8.1)
eGFR: 89 mL/min/{1.73_m2} (ref >=60)

## 2023-09-10 LAB — CBC WITH DIFFERENTIAL/PLATELET
Absolute Monocytes: 605 cells/uL (ref 200–950)
Basophils Absolute: 7 cells/uL (ref 0–200)
Basophils Relative: 0.1 %
Eosinophils Absolute: 43 cells/uL (ref 15–500)
Eosinophils Relative: 0.6 %
HCT: 38.6 % (ref 35.0–45.0)
Hemoglobin: 12.6 g/dL (ref 11.7–15.5)
Lymphs Abs: 1166 cells/uL (ref 850–3900)
MCH: 28.4 pg (ref 27.0–33.0)
MCHC: 32.6 g/dL (ref 32.0–36.0)
MCV: 87.1 fL (ref 80.0–100.0)
MPV: 9.7 fL (ref 7.5–12.5)
Monocytes Relative: 8.4 %
Neutro Abs: 5378 cells/uL (ref 1500–7800)
Neutrophils Relative %: 74.7 %
Platelets: 300 10*3/uL (ref 140–400)
RBC: 4.43 10*6/uL (ref 3.80–5.10)
RDW: 12.1 % (ref 11.0–15.0)
Total Lymphocyte: 16.2 %
WBC: 7.2 10*3/uL (ref 3.8–10.8)

## 2023-09-10 LAB — VITAMIN D 25 HYDROXY (VIT D DEFICIENCY, FRACTURES): Vit D, 25-Hydroxy: 14 ng/mL — ABNORMAL LOW (ref 30–100)

## 2023-09-10 LAB — SEDIMENTATION RATE: Sed Rate: 17 mm/h (ref 0–20)

## 2023-09-16 DIAGNOSIS — M3210 Systemic lupus erythematosus, organ or system involvement unspecified: Secondary | ICD-10-CM | POA: Diagnosis not present

## 2023-09-17 ENCOUNTER — Telehealth: Payer: Self-pay | Admitting: Pharmacist

## 2023-09-17 NOTE — Telephone Encounter (Signed)
Received fax from [facility] regarding Benlysta IV [phone]) infusion that patient received on 09/16/2023. Dose: 687mg  Labs were not drawn.  Patient tolerated infusion without complications.  Changes/concerns since last visit: sinus surgery on 08/21/2023. Had culture that grew MRSA and pseudomonas. Prescribed Augmentin which was switched to Cipro and Clindamycin x 10 days. Patient preemptively discontinued abx and restarted a few days before infusion. NP that is on-site recommended proceeding with infusion. No other recent vaccines, hospitalization, or new medical conditions.  Next BENLYSTA infusion scheduled for 10/14/2023  Chesley Mires, PharmD, MPH, BCPS, CPP Clinical Pharmacist (Rheumatology and Pulmonology)

## 2023-09-23 DIAGNOSIS — J3489 Other specified disorders of nose and nasal sinuses: Secondary | ICD-10-CM | POA: Diagnosis not present

## 2023-09-23 DIAGNOSIS — M329 Systemic lupus erythematosus, unspecified: Secondary | ICD-10-CM | POA: Diagnosis not present

## 2023-10-14 DIAGNOSIS — M3210 Systemic lupus erythematosus, organ or system involvement unspecified: Secondary | ICD-10-CM | POA: Diagnosis not present

## 2023-10-15 ENCOUNTER — Telehealth: Payer: Self-pay | Admitting: Pharmacist

## 2023-10-15 NOTE — Telephone Encounter (Signed)
Received fax from [facility] regarding Benlysta IV [id number]) infusion that patient received on 10/14/2023. Dose: 710mg  Labs were not drawn.  Patient tolerated infusion without complications.  Changes/concerns since last visit: none  Next BENLYSTA infusion scheduled for 11/18/2023  Chesley Mires, PharmD, MPH, BCPS, CPP Clinical Pharmacist (Rheumatology and Pulmonology)

## 2023-10-28 DIAGNOSIS — F3161 Bipolar disorder, current episode mixed, mild: Secondary | ICD-10-CM | POA: Diagnosis not present

## 2023-10-28 DIAGNOSIS — F419 Anxiety disorder, unspecified: Secondary | ICD-10-CM | POA: Diagnosis not present

## 2023-10-28 DIAGNOSIS — F902 Attention-deficit hyperactivity disorder, combined type: Secondary | ICD-10-CM | POA: Diagnosis not present

## 2023-11-16 ENCOUNTER — Other Ambulatory Visit: Payer: Self-pay | Admitting: Internal Medicine

## 2023-11-16 DIAGNOSIS — K219 Gastro-esophageal reflux disease without esophagitis: Secondary | ICD-10-CM

## 2023-11-16 NOTE — Telephone Encounter (Signed)
Last Fill: 06/25/2023  Next Visit: 12/15/2023  Last Visit: 09/09/2023  Current Dose per office note on 09/09/2023: not discussed  Okay to refill Protonix?

## 2023-12-02 DIAGNOSIS — M3210 Systemic lupus erythematosus, organ or system involvement unspecified: Secondary | ICD-10-CM | POA: Diagnosis not present

## 2023-12-15 ENCOUNTER — Ambulatory Visit: Payer: Medicaid Other | Admitting: Internal Medicine
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
## 2023-12-22 ENCOUNTER — Ambulatory Visit: Payer: Medicaid Other | Admitting: Internal Medicine

## 2023-12-22 ENCOUNTER — Telehealth: Payer: Self-pay | Admitting: Internal Medicine

## 2023-12-22 NOTE — Telephone Encounter (Signed)
 Patient states at her last appointment with Dr. Dimple Casey they discussed a referral to pain management.  Patient requested a return call.

## 2023-12-22 NOTE — Telephone Encounter (Signed)
 Referral for pain management was placed 04/2022,  returned call - voicemail full

## 2024-01-06 DIAGNOSIS — M3210 Systemic lupus erythematosus, organ or system involvement unspecified: Secondary | ICD-10-CM | POA: Diagnosis not present

## 2024-01-07 ENCOUNTER — Telehealth: Payer: Self-pay | Admitting: Pharmacist

## 2024-01-07 NOTE — Telephone Encounter (Signed)
Received fax from [facility] regarding Benlysta IV [phone]) infusion that patient received on 01/06/2024. Labs were drawn.  Patient tolerated infusion without complications.  Changes/concerns since last visit: none  Next Benlysta infusion scheduled for 01/31/2024  Chesley Mires, PharmD, MPH, BCPS, CPP Clinical Pharmacist (Rheumatology and Pulmonology)

## 2024-01-20 DIAGNOSIS — F3161 Bipolar disorder, current episode mixed, mild: Secondary | ICD-10-CM | POA: Diagnosis not present

## 2024-01-20 DIAGNOSIS — F902 Attention-deficit hyperactivity disorder, combined type: Secondary | ICD-10-CM | POA: Diagnosis not present

## 2024-01-20 DIAGNOSIS — F419 Anxiety disorder, unspecified: Secondary | ICD-10-CM | POA: Diagnosis not present

## 2024-02-12 DIAGNOSIS — M3210 Systemic lupus erythematosus, organ or system involvement unspecified: Secondary | ICD-10-CM | POA: Diagnosis not present

## 2024-02-16 ENCOUNTER — Telehealth: Payer: Self-pay | Admitting: Pharmacist
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
## 2024-02-16 NOTE — Telephone Encounter (Signed)
 Received fax from [facility] regarding Benlysta infusion. Requires order renewal. Paitent has OV with Dr. Dimple Casey on 02/17/2024. Will need updated clinicals to send with renewal  Referral form placed in Dr. Gregary Cromer folder for signature today  Dose; 10mg /kg IV every 4 weeks Premeds: acetaminophen 650mg  p.o. and diphenhydramine 25mg  p.o  Labs: CBC/CMP/dsDNA/ESR/C3 and C4 complement every 3 motnhs; TB gold yearly  Chesley Mires, PharmD, MPH, BCPS, CPP Clinical Pharmacist (Rheumatology and Pulmonology)

## 2024-02-17 ENCOUNTER — Ambulatory Visit

## 2024-02-17 ENCOUNTER — Encounter: Payer: Self-pay | Admitting: Internal Medicine

## 2024-02-17 ENCOUNTER — Ambulatory Visit: Payer: Medicaid Other | Attending: Internal Medicine | Admitting: Internal Medicine

## 2024-02-17 VITALS — BP 110/72 | HR 89 | Resp 16 | Ht 61.5 in | Wt 159.0 lb

## 2024-02-17 DIAGNOSIS — Z79899 Other long term (current) drug therapy: Secondary | ICD-10-CM

## 2024-02-17 DIAGNOSIS — G8929 Other chronic pain: Secondary | ICD-10-CM | POA: Diagnosis not present

## 2024-02-17 DIAGNOSIS — R296 Repeated falls: Secondary | ICD-10-CM | POA: Diagnosis not present

## 2024-02-17 DIAGNOSIS — M3219 Other organ or system involvement in systemic lupus erythematosus: Secondary | ICD-10-CM

## 2024-02-17 DIAGNOSIS — M5441 Lumbago with sciatica, right side: Secondary | ICD-10-CM

## 2024-02-17 MED ORDER — CYCLOBENZAPRINE HCL 10 MG PO TABS: ORAL_TABLET | ORAL | 0 refills | Status: AC

## 2024-02-17 NOTE — Telephone Encounter (Signed)
 Received signed Benlysta infusion order form renewal for [facility]. Will fax once clinicals are updated from 02/17/2024 OV  Phone: [phone] Fax: [phone]  Chesley Mires, PharmD, MPH, BCPS, CPP Clinical Pharmacist (Rheumatology and Pulmonology)

## 2024-02-18 DIAGNOSIS — M329 Systemic lupus erythematosus, unspecified: Secondary | ICD-10-CM | POA: Diagnosis not present

## 2024-02-18 DIAGNOSIS — J3489 Other specified disorders of nose and nasal sinuses: Secondary | ICD-10-CM | POA: Diagnosis not present

## 2024-02-18 DIAGNOSIS — R220 Localized swelling, mass and lump, head: Secondary | ICD-10-CM | POA: Diagnosis not present

## 2024-02-19 NOTE — Telephone Encounter (Signed)
 Submitted Benlysta IV order renewal form to [facility] via fax with updated clinicals  Phone: [phone] Fax: [phone]  Chesley Mires, PharmD, MPH, BCPS, CPP Clinical Pharmacist (Rheumatology and Pulmonology)

## 2024-02-20 LAB — CBC WITH DIFFERENTIAL/PLATELET
Absolute Lymphocytes: 1113 {cells}/uL (ref 850–3900)
Absolute Monocytes: 312 {cells}/uL (ref 200–950)
Basophils Absolute: 22 {cells}/uL (ref 0–200)
Basophils Relative: 0.5 %
Eosinophils Absolute: 242 {cells}/uL (ref 15–500)
Eosinophils Relative: 5.5 %
HCT: 35.8 % (ref 35.0–45.0)
Hemoglobin: 12.1 g/dL (ref 11.7–15.5)
MCH: 28.4 pg (ref 27.0–33.0)
MCHC: 33.8 g/dL (ref 32.0–36.0)
MCV: 84 fL (ref 80.0–100.0)
MPV: 9.5 fL (ref 7.5–12.5)
Monocytes Relative: 7.1 %
Neutro Abs: 2710 {cells}/uL (ref 1500–7800)
Neutrophils Relative %: 61.6 %
Platelets: 204 10*3/uL (ref 140–400)
RBC: 4.26 10*6/uL (ref 3.80–5.10)
RDW: 12.7 % (ref 11.0–15.0)
Total Lymphocyte: 25.3 %
WBC: 4.4 10*3/uL (ref 3.8–10.8)

## 2024-02-20 LAB — COMPLETE METABOLIC PANEL WITH GFR
AG Ratio: 1.7 (calc) (ref 1.0–2.5)
ALT: 10 U/L (ref 6–29)
AST: 14 U/L (ref 10–35)
Albumin: 4.2 g/dL (ref 3.6–5.1)
Alkaline phosphatase (APISO): 90 U/L (ref 31–125)
BUN: 13 mg/dL (ref 7–25)
CO2: 27 mmol/L (ref 20–32)
Calcium: 8.8 mg/dL (ref 8.6–10.2)
Chloride: 101 mmol/L (ref 98–110)
Creat: 0.78 mg/dL (ref 0.50–0.99)
Globulin: 2.5 g/dL (ref 1.9–3.7)
Glucose, Bld: 143 mg/dL — ABNORMAL HIGH (ref 65–99)
Potassium: 4.2 mmol/L (ref 3.5–5.3)
Sodium: 136 mmol/L (ref 135–146)
Total Bilirubin: 0.5 mg/dL (ref 0.2–1.2)
Total Protein: 6.7 g/dL (ref 6.1–8.1)
eGFR: 94 mL/min/{1.73_m2} (ref >=60)

## 2024-02-20 LAB — QUANTIFERON-TB GOLD PLUS
Mitogen-NIL: 7.64 [IU]/mL
NIL: 0.05 [IU]/mL
QuantiFERON-TB Gold Plus: NEGATIVE
TB1-NIL: 0 [IU]/mL
TB2-NIL: 0 [IU]/mL

## 2024-02-20 LAB — ANTI-DNA ANTIBODY, DOUBLE-STRANDED: ds DNA Ab: 1 [IU]/mL

## 2024-02-20 LAB — C3 AND C4
C3 Complement: 117 mg/dL (ref 83–193)
C4 Complement: 27 mg/dL (ref 15–57)

## 2024-02-20 LAB — SEDIMENTATION RATE: Sed Rate: 14 mm/h (ref 0–20)

## 2024-02-21 ENCOUNTER — Other Ambulatory Visit: Payer: Self-pay | Admitting: *Deleted

## 2024-02-21 DIAGNOSIS — G8929 Other chronic pain: Secondary | ICD-10-CM

## 2024-03-01 NOTE — Progress Notes (Signed)
 Sed rate and dsDNA and complements are normal so do not indicate systemic lupus activity.  Her blood count and metabolic panel are normal so no trouble with current medications.

## 2024-03-08 ENCOUNTER — Other Ambulatory Visit: Payer: Self-pay | Admitting: *Deleted

## 2024-03-08 ENCOUNTER — Telehealth: Payer: Self-pay | Admitting: Internal Medicine

## 2024-03-08 DIAGNOSIS — G8929 Other chronic pain: Secondary | ICD-10-CM

## 2024-03-08 NOTE — Telephone Encounter (Signed)
 Pt called stating she has not heard from the back specialist that Dr. Dimple Casey stated he was going to refer her to. Pt would like to talk to someone about this issue. Pts number is  [phone]

## 2024-03-08 NOTE — Telephone Encounter (Signed)
 I called patient, patient does not want to do PT, patient no showed appt to pain management, referral placed for [facility] and a new referral was placed for pain management.

## 2024-03-10 ENCOUNTER — Encounter: Payer: Self-pay | Admitting: Physician Assistant

## 2024-03-10 ENCOUNTER — Ambulatory Visit: Admitting: Physician Assistant

## 2024-03-10 DIAGNOSIS — G8929 Other chronic pain: Secondary | ICD-10-CM

## 2024-03-10 DIAGNOSIS — M545 Low back pain, unspecified: Secondary | ICD-10-CM | POA: Diagnosis not present
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
## 2024-03-24 DIAGNOSIS — M129 Arthropathy, unspecified: Secondary | ICD-10-CM | POA: Diagnosis not present

## 2024-03-24 DIAGNOSIS — Z1159 Encounter for screening for other viral diseases: Secondary | ICD-10-CM | POA: Diagnosis not present

## 2024-03-24 DIAGNOSIS — R5383 Other fatigue: Secondary | ICD-10-CM | POA: Diagnosis not present

## 2024-03-24 DIAGNOSIS — E7800 Pure hypercholesterolemia, unspecified: Secondary | ICD-10-CM | POA: Diagnosis not present

## 2024-03-24 DIAGNOSIS — Z79899 Other long term (current) drug therapy: Secondary | ICD-10-CM | POA: Diagnosis not present

## 2024-03-24 DIAGNOSIS — E559 Vitamin D deficiency, unspecified: Secondary | ICD-10-CM | POA: Diagnosis not present

## 2024-03-24 DIAGNOSIS — D539 Nutritional anemia, unspecified: Secondary | ICD-10-CM | POA: Diagnosis not present

## 2024-03-24 DIAGNOSIS — Z131 Encounter for screening for diabetes mellitus: Secondary | ICD-10-CM | POA: Diagnosis not present

## 2024-03-29 DIAGNOSIS — Z79899 Other long term (current) drug therapy: Secondary | ICD-10-CM | POA: Diagnosis not present

## 2024-03-30 ENCOUNTER — Ambulatory Visit
Admission: RE | Admit: 2024-03-30 | Discharge: 2024-03-30 | Disposition: A | Discharge status: Home / Self Care | Source: Ambulatory Visit | Attending: Physician Assistant | Admitting: Physician Assistant

## 2024-03-30 DIAGNOSIS — M545 Low back pain, unspecified: Secondary | ICD-10-CM

## 2024-03-31 DIAGNOSIS — M3210 Systemic lupus erythematosus, organ or system involvement unspecified: Secondary | ICD-10-CM | POA: Diagnosis not present

## 2024-04-07 DIAGNOSIS — Z79899 Other long term (current) drug therapy: Secondary | ICD-10-CM | POA: Diagnosis not present

## 2024-04-07 DIAGNOSIS — L932 Other local lupus erythematosus: Secondary | ICD-10-CM | POA: Diagnosis not present

## 2024-04-07 DIAGNOSIS — H02883 Meibomian gland dysfunction of right eye, unspecified eyelid: Secondary | ICD-10-CM | POA: Diagnosis not present

## 2024-04-07 DIAGNOSIS — H02886 Meibomian gland dysfunction of left eye, unspecified eyelid: Secondary | ICD-10-CM | POA: Diagnosis not present

## 2024-04-13 DIAGNOSIS — Z79899 Other long term (current) drug therapy: Secondary | ICD-10-CM | POA: Diagnosis not present

## 2024-04-15 ENCOUNTER — Ambulatory Visit: Admitting: Physical Medicine and Rehabilitation

## 2024-04-15 DIAGNOSIS — Z79899 Other long term (current) drug therapy: Secondary | ICD-10-CM | POA: Diagnosis not present

## 2024-04-19 ENCOUNTER — Encounter: Payer: Self-pay | Admitting: Physical Medicine and Rehabilitation

## 2024-04-20 ENCOUNTER — Ambulatory Visit: Admitting: Physical Medicine and Rehabilitation

## 2024-04-20 ENCOUNTER — Encounter: Payer: Self-pay | Admitting: Physical Medicine and Rehabilitation

## 2024-04-20 DIAGNOSIS — M5441 Lumbago with sciatica, right side: Secondary | ICD-10-CM | POA: Diagnosis not present

## 2024-04-20 DIAGNOSIS — M5116 Intervertebral disc disorders with radiculopathy, lumbar region: Secondary | ICD-10-CM

## 2024-04-20 DIAGNOSIS — M5442 Lumbago with sciatica, left side: Secondary | ICD-10-CM | POA: Diagnosis not present

## 2024-04-20 DIAGNOSIS — M5416 Radiculopathy, lumbar region: Secondary | ICD-10-CM

## 2024-04-20 DIAGNOSIS — F902 Attention-deficit hyperactivity disorder, combined type: Secondary | ICD-10-CM | POA: Diagnosis not present

## 2024-04-20 DIAGNOSIS — G8929 Other chronic pain: Secondary | ICD-10-CM

## 2024-04-20 DIAGNOSIS — M47816 Spondylosis without myelopathy or radiculopathy, lumbar region: Secondary | ICD-10-CM | POA: Diagnosis not present

## 2024-04-20 DIAGNOSIS — F419 Anxiety disorder, unspecified: Secondary | ICD-10-CM | POA: Diagnosis not present

## 2024-04-20 DIAGNOSIS — F3161 Bipolar disorder, current episode mixed, mild: Secondary | ICD-10-CM | POA: Diagnosis not present

## 2024-04-20 NOTE — Progress Notes (Signed)
 Pain Scale   Average Pain 9 Patient advised she has chronic lower back pain radiating to bilateral legs.Patient here for MRI review.        +Driver, -BT, -Dye Allergies.

## 2024-04-20 NOTE — Progress Notes (Signed)
 Christina Morrison - 48 y.o. female MRN [id number]  Date of birth: 12-24-1975  Office Visit Note: Visit Date: 04/20/2024 PCP: Patient, No Pcp Per Referred by: Persons, Norma Beckers, PA  Subjective: Chief Complaint  Patient presents with   Lower Back - Pain   HPI: Christina Morrison is a 48 y.o. female who comes in today per the request of Norma Beckers Persons, PA for evaluation of chronic, worsening and severe bilateral lower back pain radiating down posterolateral legs to feet, left greater than right. Also reports numbness/tingling to bilateral legs. Pain ongoing for several years. Her pain worsens with prolonged sitting and standing. She describes her pain as sore and aching sensation, currently rates as 9 out of 10. States she feels like the bones in her back are going to break. Some relief of pain with home exercise regimen, rest and use of medications. No history of formal physical therapy. Recent lumbar MRI imaging shows broad-based disc osteophyte and moderate facet arthrosis at L4-L5 resulting in effacement of the thecal sac and crowding of the descending nerve roots in the lateral recess. There is spinal canal narrowing at this level. Patient denies focal weakness. No recent trauma or falls.   Patient carries diagnosis of systemic lupus, she is managed by Dr. Milton Alpers. She has upcoming appointment with Dr. Laverle Postin with [facility] on 06/14/2024.       Review of Systems  Musculoskeletal:  Positive for back pain.  Neurological:  Positive for tingling. Negative for focal weakness and weakness.  All other systems reviewed and are negative.  Otherwise per HPI.  Assessment & Plan: Visit Diagnoses:    ICD-10-CM   1. Chronic bilateral low back pain with bilateral sciatica  M54.42 Ambulatory referral to Physical Medicine Rehab   M54.41    G89.29     2. Lumbar radiculopathy  M54.16 Ambulatory referral to Physical Medicine Rehab    3. Intervertebral disc  disorders with radiculopathy, lumbar region  M51.16 Ambulatory referral to Physical Medicine Rehab    4. Facet arthropathy, lumbar  M47.816 Ambulatory referral to Physical Medicine Rehab       Plan: Findings:  Chronic, worsening and severe bilateral lower back pain radiating down posterolateral legs to feet, left greater than right. Patient continues to have severe pain despite good conservative therapies such as home exercise regimen, rest and use of medications. Patients clinical presentation and exam are consistent with lumbar radiculopathy, more of L5 nerve pattern. There is disc osteophyte complex and moderate facet arthropathy at L4-L5 resulting in effacement of thecal sac. We discussed treatment plan in detail today. Next step is to perform diagnostic and hopefully therapeutic left L4-L5 interlaminar epidural steroid injection under fluoroscopic guidance. She is not currently taking anticoagulant medication. If good relief of pain with injection we can repeat this procedure infrequently as needed.  Dr. Daisey Dryer at bedside to discuss injection and answer any questions.  I encouraged her to follow-up with Dr. Alessandra Ancona for chronic pain management.  She has upcoming appointment with Dr. Rodell Citrin in June.  Patient has no questions at this time.  No red flag symptoms noted upon exam today.    Meds & Orders: No orders of the defined types were placed in this encounter.   Orders Placed This Encounter  Procedures   Ambulatory referral to Physical Medicine Rehab    Follow-up: Return for Left L4-L5 interlaminar epidural steroid injection.   Procedures: No procedures performed      Clinical History: MR  LUMBAR SPINE WITHOUT IV CONTRAST   COMPARISON: None available   CLINICAL HISTORY: Low back pain.   TECHNIQUE: SAG T2, SAG T1, SAG STIR, AX T2, AX T1 without IV contrast.   FINDINGS: There is normal alignment of the lumbar spine. Mild disc desiccation is seen at L3-4 and L4-5. Mild facet  arthrosis. Mild discogenic edema is present in the L4-5 level (Modic 1 change). There is no vertebral body height loss, subluxation or marrow replacing process. The sacrum and SI joints are unremarkable so far as visualized. Conus and cauda equina are unremarkable.   T12-L1: There is no focal disc protrusion, foraminal or spinal stenosis.   L1-2: There is no focal disc protrusion, foraminal or spinal stenosis. Mild facet arthrosis.   L2-3: There is no focal disc protrusion, foraminal or spinal stenosis. Mild-to-moderate facet arthrosis.   L3-4: Disc desiccation and mild to moderate facet arthrosis. There is mild caudal from the right and left. No high-grade spinal or foraminal stenosis is present.   L4-5: There is a broad-based disc osteophyte and mild-to-moderate facet arthrosis, left-sided and right rib is crowding of the descending nerve roots in the lateral recess, left greater than right. There is moderate to severe foraminal narrowing, left greater right. Correlation for left L4 radiculopathy.   L5-S1: There is no focal disc protrusion, foraminal or spinal stenosis.   The retroperitoneal structures demonstrate no significant abnormality.   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 Correlation left radicular symptoms.   Mild discogenic edema at the L4-5 level (Modic 1 change).   Mild degenerative changes at L3-4 as described above.   Electronically signed by: Adrien Alberta MD 04/09/2024 12:37 PM EDT RP Workstation: [id number]   She reports that she quit smoking about 3 years ago. Her smoking use included cigarettes. She started smoking about 33 years ago. She has a 3 pack-year smoking history. She has never been exposed to tobacco smoke. She has never used smokeless tobacco. No results  for input(s): "HGBA1C", "LABURIC" in the last 8760 hours.  Objective:  VS:  HT:    WT:   BMI:     BP:   HR: bpm  TEMP: ( )  RESP:  Physical Exam Vitals and nursing note reviewed.  HENT:     Head: Normocephalic and atraumatic.     Right Ear: External ear normal.     Left Ear: External ear normal.     Nose: Nose normal.     Mouth/Throat:     Mouth: Mucous membranes are moist.  Eyes:     Extraocular Movements: Extraocular movements intact.  Cardiovascular:     Rate and Rhythm: Normal rate.     Pulses: Normal pulses.  Pulmonary:     Effort: Pulmonary effort is normal.  Abdominal:     General: Abdomen is flat. There is no distension.  Musculoskeletal:        General: Tenderness present.     Cervical back: Normal range of motion.     Comments: Patient rises from seated position to standing without difficulty. Good lumbar range of motion. No pain noted with facet loading. 5/5 strength noted with bilateral hip flexion, knee flexion/extension, ankle dorsiflexion/plantarflexion and EHL. No clonus noted bilaterally. No pain upon palpation of greater trochanters. No pain with internal/external rotation of bilateral hips. Sensation intact bilaterally. Dysesthesias noted to bilateral  L5 dermatomes. Negative slump test bilaterally. Ambulates without aid, gait steady.     Skin:    General: Skin is warm and dry.     Capillary Refill: Capillary refill takes less than 2 seconds.  Neurological:     General: No focal deficit present.     Mental Status: She is alert and oriented to person, place, and time.  Psychiatric:        Mood and Affect: Mood normal.        Behavior: Behavior normal.     Ortho Exam  Imaging: No results found.  Past Medical/Family/Surgical/Social History: Medications & Allergies reviewed per EMR, new medications updated. 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/03/2020   Chronic combined systolic and diastolic CHF (congestive heart failure) (HCC) 11/26/2016   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   Cardiac arrest (HCC) 11/03/2016   Cardiogenic shock (HCC) 11/03/2016   Chronic narcotic use 11/02/2016   Depression 11/01/2016   Hypertension 10/31/2016   Chest pain 10/30/2016   Dyslipidemia 04/11/2015   Unstable angina (HCC) 04/11/2015   CAD S/P percutaneous coronary angioplasty 03/23/2015   Cocaine abuse (HCC) 03/23/2015   Chronic lower back pain 01/24/2015   Family history of diabetes mellitus (DM) 01/24/2015   Anxiety and depression 01/24/2015   Tobacco use disorder 01/24/2015   IUD (intrauterine device) in place 01/24/2015   Past Medical History:  Diagnosis Date   ADHD    Anxiety    Bipolar 1 disorder (HCC)    Borderline personality disorder (HCC)    Cardiac arrest (HCC) 11/03/2016   Cardiogenic shock (HCC) 11/03/2016   Chronic pain    Cocaine use    Coronary artery disease    a. 03/2015 NSTEMI/PCI in setting of cocaine use - BMS to diagonal. b. NSTEMI 12/2015 s/o overlapping DES to Cx, residual mild RCA and LAD disease, 75% OM1.   COVID-19    Depression    Dyslipidemia    GERD (gastroesophageal reflux disease)    HLD (hyperlipidemia)    Hypertension    Migraine    MRSA (methicillin resistant staph aureus) culture positive    Osteoarthritis    Polysubstance abuse (HCC)    a. 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   LIMA to LAD, SVG to LCx, SVG to LAD, EVH via right thigh and leg   Scoliosis    TIA (transient ischemic attack)    Family  History  Problem Relation Age of Onset   Hypertension Mother    Hyperlipidemia Mother    Depression Mother    Rheum arthritis Mother    Psoriasis Mother    Hypertension Father    Hyperlipidemia Father    Heart disease Father    Diabetes Father    Kidney disease Father    Prostate cancer Father    Stroke Father    Kidney disease Sister    Bipolar disorder Sister    Varicose Veins Brother    Stroke Maternal Grandmother    Anxiety disorder  Daughter    Anxiety disorder Daughter    Cancer Maternal Aunt    Past Surgical History:  Procedure Laterality Date   CARDIAC CATHETERIZATION  03/21/2015   Procedure: CORONARY STENT INTERVENTION;  Surgeon: Arnoldo Lapping, MD;  Location: [facility] CATH LAB;  Service: Cardiovascular;;  diag bms 2.75 x 12 vision   CARDIAC CATHETERIZATION N/A 01/14/2016   Procedure: Coronary Stent Intervention;  Surgeon: Arnoldo Lapping, MD;  Location: [facility] INVASIVE CV LAB;  Service: Cardiovascular;  Laterality: N/A;   CARDIAC CATHETERIZATION N/A 10/31/2016   Procedure: Left Heart Cath and Coronary Angiography;  Surgeon: Sammy Crisp, MD;  Location: [facility] INVASIVE CV LAB;  Service: Cardiovascular;  Laterality: N/A;   CARDIAC CATHETERIZATION N/A 10/31/2016   Procedure: Intravascular Ultrasound/IVUS;  Surgeon: Sammy Crisp, MD;  Location: [facility] INVASIVE CV LAB;  Service: Cardiovascular;  Laterality: N/A;   CORONARY ARTERY BYPASS GRAFT N/A 11/03/2016   Procedure: CORONARY ARTERY BYPASS GRAFTING (CABG) x  three, using left internal mammary artery and right leg greater saphenous vein harvested endoscopically;  Surgeon: Gardenia Jump, MD;  Location: [facility] OR;  Service: Open Heart Surgery;  Laterality: N/A;   CORONARY ARTERY BYPASS GRAFT N/A 11/03/2016   Procedure: EMERGENCY REDO STERNOTOMY IN ICU, REDO CABG X 1, PLACEMENT OF BILATERAL VAD, PLACEMENT OF LEFT FEMORAL ARTERIAL LINE;  Surgeon: Gardenia Jump, MD;  Location: [facility] OR;  Service: Open Heart Surgery;  Laterality: N/A;   CORONARY  STENT PLACEMENT  03/21/2015   first diagonal   ESOPHAGOGASTRODUODENOSCOPY (EGD) WITH PROPOFOL  N/A 06/11/2015   Procedure: ESOPHAGOGASTRODUODENOSCOPY (EGD) WITH PROPOFOL ;  Surgeon: Lanita Pitman, MD;  Location: [facility] ENDOSCOPY;  Service: Endoscopy;  Laterality: N/A;   HEMATOMA EVACUATION Left 11/07/2016   Procedure: EVACUATION HEMATOMA;  Surgeon: Heriberto London, MD;  Location: [facility] OR;  Service: Thoracic;  Laterality: Left;   LEFT HEART CATHETERIZATION WITH CORONARY ANGIOGRAM N/A 03/21/2015   Procedure: LEFT HEART CATHETERIZATION WITH CORONARY ANGIOGRAM;  Surgeon: Arnoldo Lapping, MD;  Location[facility] CATH LAB;  Service: Cardiovascular;  Laterality: N/A;   LEFT HEART CATHETERIZATION WITH CORONARY ANGIOGRAM N/A 04/11/2015   Procedure: LEFT HEART CATHETERIZATION WITH CORONARY ANGIOGRAM;  Surgeon: Odie Benne, MD;  Location: [facility] CATH LAB;  Service: Cardiovascular;  Laterality: N/A;   MULTIPLE TOOTH EXTRACTIONS     NOSE SURGERY  08/21/2023   REMOVAL OF CENTRIMAG VENTRICULAR ASSIST DEVICE N/A 11/07/2016   Procedure: REMOVAL OF CENTRIMAG VENTRICULAR ASSIST DEVICE;  Surgeon: Heriberto London, MD;  Location: [facility] OR;  Service: Open Heart Surgery;  Laterality: N/A;   STERNAL CLOSURE N/A 11/07/2016   Procedure: STERNAL WASHOUT WITH STERNAL CLOSURE;  Surgeon: Heriberto London, MD;  Location: [facility] OR;  Service: Thoracic;  Laterality: N/A;   TEE WITHOUT CARDIOVERSION N/A 11/03/2016   Procedure: TRANSESOPHAGEAL ECHOCARDIOGRAM (TEE);  Surgeon: Gardenia Jump, MD;  Location: [facility] OR;  Service: Open Heart Surgery;  Laterality: N/A;   TEE WITHOUT CARDIOVERSION N/A 11/07/2016   Procedure: TRANSESOPHAGEAL ECHOCARDIOGRAM (TEE);  Surgeon: Heriberto London, MD;  Location: [facility] OR;  Service: Thoracic;  Laterality: N/A;   WISDOM TOOTH EXTRACTION     Social History   Occupational History   Not on file  Tobacco Use   Smoking status: Former    Current packs/day: 0.00    Average packs/day: 0.1 packs/day for 30.0 years (3.0  ttl pk-yrs)    Types: Cigarettes    Start date: 53    Quit date: 2022    Years since quitting: 3.3    Passive exposure: Never  Smokeless tobacco: Never  Vaping Use   Vaping status: Every Day   Substances: Nicotine , Flavoring  Substance and Sexual Activity   Alcohol use: No    Alcohol/week: 0.0 standard drinks of alcohol   Drug use: Not Currently    Types: Cocaine, Marijuana    Comment: Last use cocaine & marijuana yrs ago,   Sexual activity: Not Currently    Comment: perimenopausal
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
## 2024-05-12 ENCOUNTER — Ambulatory Visit (INDEPENDENT_AMBULATORY_CARE_PROVIDER_SITE_OTHER): Admitting: Physical Medicine and Rehabilitation

## 2024-05-12 ENCOUNTER — Other Ambulatory Visit: Payer: Self-pay

## 2024-05-12 VITALS — BP 151/91 | HR 96

## 2024-05-12 DIAGNOSIS — M5416 Radiculopathy, lumbar region: Secondary | ICD-10-CM | POA: Diagnosis not present

## 2024-05-12 MED ORDER — METHYLPREDNISOLONE ACETATE 40 MG/ML IJ SUSP
40.0000 mg | Freq: Once | INTRAMUSCULAR | Status: AC
  Administered 2024-05-12: 40 mg

## 2024-05-12 NOTE — Progress Notes (Signed)
 Pain Scale   Average Pain 9 Patient advising her lower back pain is worse at night and she has a hard time getting out of bed in am.        +Driver, -BT, -Dye Allergies.

## 2024-05-12 NOTE — Progress Notes (Signed)
 Christina Morrison - 48 y.o. female MRN [id number]  Date of birth: 1976/03/22  Office Visit Note: Visit Date: 05/12/2024 PCP: Patient, No Pcp Per Referred by: Darryll Eng, NP  Subjective: Chief Complaint  Patient presents with   Lower Back - Pain   HPI:  Christina Morrison is a 48 y.o. female who comes in today at the request of Elvan Hamel, FNP for planned Left L4-5 Lumbar Interlaminar epidural steroid injection with fluoroscopic guidance.  The patient has failed conservative care including home exercise, medications, time and activity modification.  This injection will be diagnostic and hopefully therapeutic.  Please see requesting physician notes for further details and justification.  She did ask about of a catching sensation in her mid back region.  She made a point of asking if this "needed to be checked out ".  I had a long discussion with her to allow the injection to help first and then add a couple weeks from now depending on what is going on and she can let us  know where she is that and we can always regroup with looking at what appears to be may be myofascial pain etc.   ROS Otherwise per HPI.  Assessment & Plan: Visit Diagnoses:    ICD-10-CM   1. Lumbar radiculopathy  M54.16 XR C-ARM NO REPORT    Epidural Steroid injection    methylPREDNISolone acetate (DEPO-MEDROL) injection 40 mg      Plan: No additional findings.   Meds & Orders:  Meds ordered this encounter  Medications   methylPREDNISolone acetate (DEPO-MEDROL) injection 40 mg    Orders Placed This Encounter  Procedures   XR C-ARM NO REPORT   Epidural Steroid injection    Follow-up: No follow-ups on file.   Procedures: No procedures performed      Clinical History: MR LUMBAR SPINE WITHOUT IV CONTRAST   COMPARISON: None available   CLINICAL HISTORY: Low back pain.   TECHNIQUE: SAG T2, SAG T1, SAG STIR, AX T2, AX T1 without IV contrast.   FINDINGS: There is normal alignment of the lumbar spine.  Mild disc desiccation is seen at L3-4 and L4-5. Mild facet arthrosis. Mild discogenic edema is present in the L4-5 level (Modic 1 change). There is no vertebral body height loss, subluxation or marrow replacing process. The sacrum and SI joints are unremarkable so far as visualized. Conus and cauda equina are unremarkable.   T12-L1: There is no focal disc protrusion, foraminal or spinal stenosis.   L1-2: There is no focal disc protrusion, foraminal or spinal stenosis. Mild facet arthrosis.   L2-3: There is no focal disc protrusion, foraminal or spinal stenosis. Mild-to-moderate facet arthrosis.   L3-4: Disc desiccation and mild to moderate facet arthrosis. There is mild caudal from the right and left. No high-grade spinal or foraminal stenosis is present.   L4-5: There is a broad-based disc osteophyte and mild-to-moderate facet arthrosis, left-sided and right rib is crowding of the descending nerve roots in the lateral recess, left greater than right. There is moderate to severe foraminal narrowing, left greater right. Correlation for left L4 radiculopathy.   L5-S1: There is no focal disc protrusion, foraminal or spinal stenosis.   The retroperitoneal structures demonstrate no significant abnormality.   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 Correlation left radicular symptoms.   Mild discogenic edema at  the L4-5 level (Modic 1 change).   Mild degenerative changes at L3-4 as described above.   Electronically signed by: Adrien Alberta MD 04/09/2024 12:37 PM EDT RP Workstation: [id number]     Objective:  VS:  HT:    WT:   BMI:     BP:(!) 151/91  HR:96bpm  TEMP: ( )  RESP:  Physical Exam Vitals and nursing note reviewed.  Constitutional:      General: She is not in acute  distress.    Appearance: Normal appearance. She is not ill-appearing.  HENT:     Head: Normocephalic and atraumatic.     Right Ear: External ear normal.     Left Ear: External ear normal.  Eyes:     Extraocular Movements: Extraocular movements intact.  Cardiovascular:     Rate and Rhythm: Normal rate.     Pulses: Normal pulses.  Pulmonary:     Effort: Pulmonary effort is normal. No respiratory distress.  Abdominal:     General: There is no distension.     Palpations: Abdomen is soft.  Musculoskeletal:        General: Tenderness present.     Cervical back: Neck supple.     Right lower leg: No edema.     Left lower leg: No edema.     Comments: Patient has good distal strength with no pain over the greater trochanters.  No clonus or focal weakness.  Skin:    Findings: No erythema, lesion or rash.  Neurological:     General: No focal deficit present.     Mental Status: She is alert and oriented to person, place, and time.     Cranial Nerves: No cranial nerve deficit.     Sensory: No sensory deficit.     Motor: No weakness or abnormal muscle tone.     Coordination: Coordination normal.     Gait: Gait abnormal.  Psychiatric:        Mood and Affect: Mood normal.        Behavior: Behavior normal.      Imaging: No results found.

## 2024-05-12 NOTE — Patient Instructions (Signed)

## 2024-05-17 DIAGNOSIS — M3210 Systemic lupus erythematosus, organ or system involvement unspecified: Secondary | ICD-10-CM | POA: Diagnosis not present

## 2024-05-18 ENCOUNTER — Telehealth: Payer: Self-pay | Admitting: Pharmacist

## 2024-05-18 ENCOUNTER — Ambulatory Visit: Admitting: Internal Medicine

## 2024-05-18 DIAGNOSIS — G8929 Other chronic pain: Secondary | ICD-10-CM

## 2024-05-18 DIAGNOSIS — R296 Repeated falls: Secondary | ICD-10-CM

## 2024-05-18 DIAGNOSIS — Z79899 Other long term (current) drug therapy: Secondary | ICD-10-CM

## 2024-05-18 DIAGNOSIS — M3219 Other organ or system involvement in systemic lupus erythematosus: Secondary | ICD-10-CM

## 2024-05-18 NOTE — Telephone Encounter (Signed)
 Received fax from [facility]. Patient received Benlysta  infusion on 05/17/2024  Labs were drawn by [facility] and will be faxed to our office  Geraldene Kleine, PharmD, MPH, BCPS, CPP Clinical Pharmacist (Rheumatology and Pulmonology)  Next infusion scheduled for 06/23/2024  Quatavious Rossa, PharmD, MPH, BCPS, CPP Clinical Pharmacist (Rheumatology and Pulmonology)

## 2024-05-20 ENCOUNTER — Telehealth: Payer: Self-pay

## 2024-05-20 NOTE — Telephone Encounter (Signed)
 Received labs faxed from [facility], placed on Dr. Ricky Charter desk for review.
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
## 2024-06-14 ENCOUNTER — Encounter: Attending: Physical Medicine and Rehabilitation | Admitting: Physical Medicine and Rehabilitation

## 2024-06-14 ENCOUNTER — Encounter: Payer: Self-pay | Admitting: Physical Medicine and Rehabilitation

## 2024-06-14 VITALS — BP 121/87 | HR 96 | Ht 61.5 in | Wt 157.0 lb

## 2024-06-14 DIAGNOSIS — M329 Systemic lupus erythematosus, unspecified: Secondary | ICD-10-CM | POA: Diagnosis not present

## 2024-06-14 DIAGNOSIS — G894 Chronic pain syndrome: Secondary | ICD-10-CM | POA: Diagnosis not present

## 2024-06-14 DIAGNOSIS — G8929 Other chronic pain: Secondary | ICD-10-CM | POA: Diagnosis present

## 2024-06-14 DIAGNOSIS — M25561 Pain in right knee: Secondary | ICD-10-CM | POA: Insufficient documentation

## 2024-06-14 DIAGNOSIS — Z5181 Encounter for therapeutic drug level monitoring: Secondary | ICD-10-CM | POA: Insufficient documentation

## 2024-06-14 DIAGNOSIS — M25562 Pain in left knee: Secondary | ICD-10-CM | POA: Diagnosis not present

## 2024-06-14 DIAGNOSIS — M549 Dorsalgia, unspecified: Secondary | ICD-10-CM | POA: Insufficient documentation

## 2024-06-14 DIAGNOSIS — Z79899 Other long term (current) drug therapy: Secondary | ICD-10-CM | POA: Insufficient documentation

## 2024-06-14 NOTE — Progress Notes (Signed)
 Subjective:    Patient ID: Christina Morrison, female    DOB: Jun 29, 1976, 48 y.o.   MRN: [id number]  HPI Christina Morrison is a 48 year old woman who presents to establish care for chronic back pain.  1) Chronic back pain: -extends into bilateral legs -radiates laterally down her hips -was on oxycodone  in the past but her PCP got cancer and he had to retire  2) Lupus: -she gets an IV medication that starts with a B -this medication does help  Pain Inventory Average Pain 10 Pain Right Now 9 My pain is constant, sharp, burning, dull, stabbing, tingling, and aching  In the last 24 hours, has pain interfered with the following? General activity 7 Relation with others 5 Enjoyment of life 5 What TIME of day is your pain at its worst? morning , daytime, evening, and night Sleep (in general) Fair  Pain is worse with: walking, bending, sitting, inactivity, and standing Pain improves with: rest and medication Relief from Meds: 8  ability to climb steps?  yes do you drive?  yes Do you have any goals in this area?  yes  disabled: date disabled 2017 Do you have any goals in this area?  yes  bladder control problems spasms dizziness depression anxiety loss of taste or smell  Any changes since last visit?  yes x-rays (in the [facility])  Any changes since last visit?  no    Family History  Problem Relation Age of Onset   Hypertension Mother    Hyperlipidemia Mother    Depression Mother    Rheum arthritis Mother    Psoriasis Mother    Hypertension Father    Hyperlipidemia Father    Heart disease Father    Diabetes Father    Kidney disease Father    Prostate cancer Father    Stroke Father    Kidney disease Sister    Bipolar disorder Sister    Varicose Veins Brother    Stroke Maternal Grandmother    Anxiety disorder Daughter    Anxiety disorder Daughter    Cancer Maternal Aunt    Social History   Socioeconomic History   Marital status: Divorced    Spouse  name: Not on file   Number of children: Not on file   Years of education: Not on file   Highest education level: Not on file  Occupational History   Not on file  Tobacco Use   Smoking status: Former    Current packs/day: 0.00    Average packs/day: 0.1 packs/day for 30.0 years (3.0 ttl pk-yrs)    Types: Cigarettes    Start date: 76    Quit date: 2022    Years since quitting: 3.4    Passive exposure: Never   Smokeless tobacco: Never  Vaping Use   Vaping status: Every Day   Substances: Nicotine , Flavoring  Substance and Sexual Activity   Alcohol use: No    Alcohol/week: 0.0 standard drinks of alcohol   Drug use: Not Currently    Types: Cocaine, Marijuana    Comment: Last use cocaine & marijuana yrs ago,   Sexual activity: Not Currently    Comment: perimenopausal  Other Topics Concern   Not on file  Social History Narrative   Not on file   Social Drivers of Health   Financial Resource Strain: Not on file  Food Insecurity: Not on file  Transportation Needs: Not on file  Physical Activity: Not on file  Stress: Not on file  Social Connections: Not on file   Past Surgical History:  Procedure Laterality Date   CARDIAC CATHETERIZATION  03/21/2015   Procedure: CORONARY STENT INTERVENTION;  Surgeon: Ozell Fell, MD;  Location: [facility] CATH LAB;  Service: Cardiovascular;;  diag bms 2.75 x 12 vision   CARDIAC CATHETERIZATION N/A 01/14/2016   Procedure: Coronary Stent Intervention;  Surgeon: Ozell Fell, MD;  Location: [facility] INVASIVE CV LAB;  Service: Cardiovascular;  Laterality: N/A;   CARDIAC CATHETERIZATION N/A 10/31/2016   Procedure: Left Heart Cath and Coronary Angiography;  Surgeon: Lonni Hanson, MD;  Location: [facility] INVASIVE CV LAB;  Service: Cardiovascular;  Laterality: N/A;   CARDIAC CATHETERIZATION N/A 10/31/2016   Procedure: Intravascular Ultrasound/IVUS;  Surgeon: Lonni Hanson, MD;  Location: [facility] INVASIVE CV LAB;  Service: Cardiovascular;  Laterality: N/A;   CORONARY  ARTERY BYPASS GRAFT N/A 11/03/2016   Procedure: CORONARY ARTERY BYPASS GRAFTING (CABG) x  three, using left internal mammary artery and right leg greater saphenous vein harvested endoscopically;  Surgeon: Sudie VEAR Laine, MD;  Location: [facility] OR;  Service: Open Heart Surgery;  Laterality: N/A;   CORONARY ARTERY BYPASS GRAFT N/A 11/03/2016   Procedure: EMERGENCY REDO STERNOTOMY IN ICU, REDO CABG X 1, PLACEMENT OF BILATERAL VAD, PLACEMENT OF LEFT FEMORAL ARTERIAL LINE;  Surgeon: Sudie VEAR Laine, MD;  Location: [facility] OR;  Service: Open Heart Surgery;  Laterality: N/A;   CORONARY STENT PLACEMENT  03/21/2015   first diagonal   ESOPHAGOGASTRODUODENOSCOPY (EGD) WITH PROPOFOL  N/A 06/11/2015   Procedure: ESOPHAGOGASTRODUODENOSCOPY (EGD) WITH PROPOFOL ;  Surgeon: Lamar Bunk, MD;  Location: [facility] ENDOSCOPY;  Service: Endoscopy;  Laterality: N/A;   HEMATOMA EVACUATION Left 11/07/2016   Procedure: EVACUATION HEMATOMA;  Surgeon: Maude Fleeta Ochoa, MD;  Location: [facility] OR;  Service: Thoracic;  Laterality: Left;   LEFT HEART CATHETERIZATION WITH CORONARY ANGIOGRAM N/A 03/21/2015   Procedure: LEFT HEART CATHETERIZATION WITH CORONARY ANGIOGRAM;  Surgeon: Ozell Fell, MD;  Location: [facility] CATH LAB;  Service: Cardiovascular;  Laterality: N/A;   LEFT HEART CATHETERIZATION WITH CORONARY ANGIOGRAM N/A 04/11/2015   Procedure: LEFT HEART CATHETERIZATION WITH CORONARY ANGIOGRAM;  Surgeon: Lonni JONETTA Cash, MD;  Location: [facility] CATH LAB;  Service: Cardiovascular;  Laterality: N/A;   MULTIPLE TOOTH EXTRACTIONS     NOSE SURGERY  08/21/2023   REMOVAL OF CENTRIMAG VENTRICULAR ASSIST DEVICE N/A 11/07/2016   Procedure: REMOVAL OF CENTRIMAG VENTRICULAR ASSIST DEVICE;  Surgeon: Maude Fleeta Ochoa, MD;  Location: [facility] OR;  Service: Open Heart Surgery;  Laterality: N/A;   STERNAL CLOSURE N/A 11/07/2016   Procedure: STERNAL WASHOUT WITH STERNAL CLOSURE;  Surgeon: Maude Fleeta Ochoa, MD;  Location: [facility] OR;  Service: Thoracic;  Laterality: N/A;   TEE  WITHOUT CARDIOVERSION N/A 11/03/2016   Procedure: TRANSESOPHAGEAL ECHOCARDIOGRAM (TEE);  Surgeon: Sudie VEAR Laine, MD;  Location: [facility] OR;  Service: Open Heart Surgery;  Laterality: N/A;   TEE WITHOUT CARDIOVERSION N/A 11/07/2016   Procedure: TRANSESOPHAGEAL ECHOCARDIOGRAM (TEE);  Surgeon: Maude Fleeta Ochoa, MD;  Location: [facility] OR;  Service: Thoracic;  Laterality: N/A;   WISDOM TOOTH EXTRACTION     Past Medical History:  Diagnosis Date   ADHD    Anxiety    Bipolar 1 disorder (HCC)    Borderline personality disorder (HCC)    Cardiac arrest (HCC) 11/03/2016   Cardiogenic shock (HCC) 11/03/2016   Chronic pain    Cocaine use    Coronary artery disease    a. 03/2015 NSTEMI/PCI in setting of cocaine use - BMS to diagonal. b. NSTEMI 12/2015 s/o overlapping DES to  Cx, residual mild RCA and LAD disease, 75% OM1.   COVID-19    Depression    Dyslipidemia    GERD (gastroesophageal reflux disease)    HLD (hyperlipidemia)    Hypertension    Migraine    MRSA (methicillin resistant staph aureus) culture positive    Osteoarthritis    Polysubstance abuse (HCC)    a. 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   LIMA to LAD, SVG to LCx, SVG to LAD, EVH via right thigh and leg   Scoliosis    TIA (transient ischemic attack)    Ht 5' 1.5 (1.562 m)   Wt 157 lb (71.2 kg)   BMI 29.18 kg/m   Opioid Risk Score:   Fall Risk Score:  `1  Depression screen [facility] 2/9     01/24/2015    3:54 PM  Depression screen PHQ 2/9  Decreased Interest 3  Down, Depressed, Hopeless 2  PHQ - 2 Score 5  Altered sleeping 3  Tired, decreased energy 3  Change in appetite 1  Feeling bad or failure about yourself  2  Trouble concentrating 3  Moving slowly or fidgety/restless 2  Suicidal thoughts 1   PHQ-9 Score 20     Data saved with a previous flowsheet row definition    Review of Systems  Genitourinary:        Bladder leakage with cough  Musculoskeletal:  Positive for back  pain.       Pain the right hand, left knee pain  All other systems reviewed and are negative.      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 Discussed that this is a capsaicin patch, stronger than capsaicin cream. Discussed that it is currently approved for diabetic peripheral neuropathy and post-herpetic neuralgia, but that it has also shown benefit in treating other forms of neuropathy. Provided patient with link to site to learn more about the patch: https://www.clark.biz/. Discussed that the patch would be placed in office and benefits usually last 3 months. Discussed that unintended exposure to capsaicin can cause severe irritation of eyes, mucous membranes, respiratory tract, and skin, but that Qutenza is a local treatment and does not have the systemic side effects of other nerve medications. Discussed that there may be pain, itching, erythema, and decreased sensory function associated with the application of Qutenza. Side effects usually subside within 1 week. A cold pack of analgesic medications can help with these side effects. Blood pressure can also be increased due to pain associated with administration of the patch.   -provided referral to PCP  -Discussed current symptoms of pain and history of pain.  -Discussed benefits of exercise in reducing pain. -Discussed following foods that may reduce pain: 1) Ginger (especially studied for arthritis)- reduce  leukotriene production to decrease inflammation 2) Blueberries- high in phytonutrients that decrease inflammation 3) Salmon- marine omega-3s reduce joint swelling and pain 4) Pumpkin seeds- reduce inflammation 5) dark chocolate- reduces inflammation 6) turmeric-  reduces inflammation 7) tart cherries - reduce pain and stiffness 8) extra virgin olive oil - its compound olecanthal helps to block prostaglandins  9) chili peppers- can be eaten or applied topically via capsaicin 10) mint- helpful for headache, muscle aches, joint pain, and itching 11) garlic- reduces inflammation 12) Green tea- reduces inflammation and oxidative stress, helps with weight loss, may reduce the risk of cancer, recommend Double Green Matcha [place] of Tea daily  Link to further information on diet for chronic pain: [email or web address].bray.com/   2) Bilateral knee pain: -Xrs ordered  3) Lupus:  -discussed that her current lupus medication prescribed by rheumatology has been helpful for her

## 2024-06-14 NOTE — Patient Instructions (Signed)
 Foods that may reduce pain: 1) Ginger (especially studied for arthritis)- reduce leukotriene production to decrease inflammation 2) Blueberries- high in phytonutrients that decrease inflammation 3) Salmon- marine omega-3s reduce joint swelling and pain 4) Pumpkin seeds- reduce inflammation 5) dark chocolate- reduces inflammation 6) turmeric- reduces inflammation 7) tart cherries - reduce pain and stiffness 8) extra virgin olive oil - its compound olecanthal helps to block prostaglandins  9) chili peppers- can be eaten or applied topically via capsaicin 10) mint- helpful for headache, muscle aches, joint pain, and itching 11) garlic- reduces inflammation 12) Green tea- reduces inflammation and oxidative stress, helps with weight loss, may reduce the risk of cancer, recommend Double Green Matcha [place] of Tea daily  Link to further information on diet for chronic pain: http://www.bray.com/

## 2024-06-17 LAB — TOXASSURE SELECT,+ANTIDEPR,UR

## 2024-06-19 ENCOUNTER — Other Ambulatory Visit: Payer: Self-pay | Admitting: Physical Medicine and Rehabilitation

## 2024-06-19 MED ORDER — OXYCODONE-ACETAMINOPHEN 10-325 MG PO TABS: 1.0000 | ORAL_TABLET | Freq: Two times a day (BID) | ORAL | 0 refills | Status: DC | PRN

## 2024-06-21 ENCOUNTER — Ambulatory Visit: Attending: Internal Medicine | Admitting: Internal Medicine

## 2024-06-21 ENCOUNTER — Telehealth: Payer: Self-pay | Admitting: Physical Medicine and Rehabilitation

## 2024-06-21 ENCOUNTER — Ambulatory Visit

## 2024-06-21 ENCOUNTER — Encounter: Payer: Self-pay | Admitting: Internal Medicine

## 2024-06-21 ENCOUNTER — Telehealth: Payer: Self-pay

## 2024-06-21 VITALS — BP 133/88 | HR 96 | Resp 14 | Ht 61.0 in | Wt 152.0 lb

## 2024-06-21 DIAGNOSIS — M25462 Effusion, left knee: Secondary | ICD-10-CM | POA: Diagnosis not present

## 2024-06-21 DIAGNOSIS — R296 Repeated falls: Secondary | ICD-10-CM | POA: Insufficient documentation

## 2024-06-21 DIAGNOSIS — Z79899 Other long term (current) drug therapy: Secondary | ICD-10-CM | POA: Diagnosis not present

## 2024-06-21 DIAGNOSIS — M3219 Other organ or system involvement in systemic lupus erythematosus: Secondary | ICD-10-CM | POA: Insufficient documentation

## 2024-06-21 DIAGNOSIS — M25562 Pain in left knee: Secondary | ICD-10-CM | POA: Insufficient documentation

## 2024-06-21 DIAGNOSIS — G8929 Other chronic pain: Secondary | ICD-10-CM | POA: Insufficient documentation

## 2024-06-21 DIAGNOSIS — M5441 Lumbago with sciatica, right side: Secondary | ICD-10-CM | POA: Insufficient documentation

## 2024-06-21 MED ORDER — OXYCODONE HCL 10 MG PO TABS: 10.0000 mg | ORAL_TABLET | Freq: Two times a day (BID) | ORAL | 0 refills | Status: DC | PRN

## 2024-06-21 MED ORDER — PREDNISONE 10 MG PO TABS: ORAL_TABLET | ORAL | 0 refills | Status: AC

## 2024-06-21 NOTE — Telephone Encounter (Signed)
 PMP was Reviewed. : Last Oxycodone  prescribed 08/28/2020 UDS was Reviewed. Dr Lorilee note was reviewed,Oxycodone /10/325 canceled Oxycodone  10 mg/ BID as needed e-scribed Christina Morrison is aware of the above.

## 2024-06-21 NOTE — Telephone Encounter (Signed)
 Patient called stating that she needs a PA on Oxy/APAP and that Dr Lorilee stating in her visit that she would send Oxycodone  without APAP. It is mentioned in her note. I called and cancelled Oxy/APAP. Can you send Oxycodone  10 mg BID prn? Dx code M54.9, M25.561, F74.437

## 2024-06-21 NOTE — Telephone Encounter (Signed)
 error

## 2024-06-21 NOTE — Telephone Encounter (Signed)
(  Key: [id number]) PA Case ID #: [id number] Submitted for Oxycodone

## 2024-06-22 NOTE — Telephone Encounter (Signed)
 Outcome Approved on July 8 by [employer] 2017 NCPDP Request Reference Number: [id number]. OXYCODONE  TAB 10MG  is approved through 12/22/2024. For further questions, call [employer] at [phone]. Effective Date: 06/21/2024 Authorization Expiration Date: 12/22/2024

## 2024-06-23 DIAGNOSIS — M3210 Systemic lupus erythematosus, organ or system involvement unspecified: Secondary | ICD-10-CM | POA: Diagnosis not present

## 2024-07-19 ENCOUNTER — Encounter: Payer: Self-pay | Admitting: Registered Nurse

## 2024-07-19 ENCOUNTER — Encounter: Attending: Registered Nurse | Admitting: Registered Nurse

## 2024-07-19 VITALS — BP 117/87 | HR 92 | Ht 61.0 in | Wt 162.0 lb

## 2024-07-19 DIAGNOSIS — Y92009 Unspecified place in unspecified non-institutional (private) residence as the place of occurrence of the external cause: Secondary | ICD-10-CM | POA: Diagnosis not present

## 2024-07-19 DIAGNOSIS — W19XXXD Unspecified fall, subsequent encounter: Secondary | ICD-10-CM | POA: Insufficient documentation

## 2024-07-19 DIAGNOSIS — Z79899 Other long term (current) drug therapy: Secondary | ICD-10-CM | POA: Diagnosis not present

## 2024-07-19 DIAGNOSIS — M5416 Radiculopathy, lumbar region: Secondary | ICD-10-CM | POA: Insufficient documentation

## 2024-07-19 DIAGNOSIS — G894 Chronic pain syndrome: Secondary | ICD-10-CM | POA: Diagnosis not present

## 2024-07-19 DIAGNOSIS — M25562 Pain in left knee: Secondary | ICD-10-CM | POA: Diagnosis not present

## 2024-07-19 DIAGNOSIS — Z5181 Encounter for therapeutic drug level monitoring: Secondary | ICD-10-CM | POA: Insufficient documentation

## 2024-07-19 MED ORDER — OXYCODONE HCL 10 MG PO TABS: 10.0000 mg | ORAL_TABLET | Freq: Three times a day (TID) | ORAL | 0 refills | Status: DC | PRN

## 2024-07-19 NOTE — Progress Notes (Signed)
 Subjective:    Patient ID: Christina Morrison, female    DOB: 23-Nov-1976, 48 y.o.   MRN: [id number]  HPI: [name] is a 48 y.o. female who returns for follow up appointment for chronic pain and medication refill. She states her  pain is located in her lower back radiating into her bilateral lower extremities and left knee pain. She  rates her pain 8. Her current exercise regime is walking and performing stretching  exercises.  Ms. Morro reports 3- 4 hours of pain relief with her current medication regimen.   Ms. Brinson reports 6 weeks ago she lost her balanced and landed on her left knee, she was able to pick herself up. She was educated on falls Prevention, she verbalizes understanding.   Ms. Clauson asked about MRI, she states shje had spoke with Dr Lorilee regarding the MRI of her Left knee, Dr Lorilee notes was reviewed, will send a message to Dr Lorilee regarding the above. She realizes Dr Lorilee is on vacation this week.   Ms. Kuwahara Morphine  equivalent is 30.00  MME. She is also prescribed Clonazepam by  Idell Remington  .We have discussed the black box warning of using opioids and benzodiazepines. I highlighted the dangers of using these drugs together and discussed the adverse events including respiratory suppression, overdose, cognitive impairment and importance of compliance with current regimen. We will continue to monitor and adjust as indicated.  she is being closely monitored and under the care of her psychiatrist.    Last UDS was Performed 06/14/2024, it was consistent.     Pain Inventory Average Pain 8 Pain Right Now 8 My pain is sharp, burning, dull, stabbing, tingling, and aching  In the last 24 hours, has pain interfered with the following? General activity 4 Relation with others 4 Enjoyment of life 4 What TIME of day is your pain at its worst? morning  and evening Sleep (in general) Poor  Pain is worse with: walking, bending, standing, and some activites Pain  improves with: medication Relief from Meds: 4  Family History  Problem Relation Age of Onset   Hypertension Mother    Hyperlipidemia Mother    Depression Mother    Rheum arthritis Mother    Psoriasis Mother    Hypertension Father    Hyperlipidemia Father    Heart disease Father    Diabetes Father    Kidney disease Father    Prostate cancer Father    Stroke Father    Kidney disease Sister    Bipolar disorder Sister    Varicose Veins Brother    Stroke Maternal Grandmother    Anxiety disorder Daughter    Anxiety disorder Daughter    Cancer Maternal Aunt    Social History   Socioeconomic History   Marital status: Divorced    Spouse name: Not on file   Number of children: Not on file   Years of education: Not on file   Highest education level: Not on file  Occupational History   Not on file  Tobacco Use   Smoking status: Former    Current packs/day: 0.00    Average packs/day: 0.1 packs/day for 30.0 years (3.0 ttl pk-yrs)    Types: Cigarettes    Start date: 44    Quit date: 2022    Years since quitting: 3.5    Passive exposure: Never   Smokeless tobacco: Never  Vaping Use   Vaping status: Every Day   Substances: Nicotine , Flavoring  Substance and Sexual Activity  Alcohol use: No    Alcohol/week: 0.0 standard drinks of alcohol   Drug use: Not Currently    Types: Cocaine, Marijuana    Comment: Last use cocaine & marijuana yrs ago,   Sexual activity: Not Currently    Comment: perimenopausal  Other Topics Concern   Not on file  Social History Narrative   Not on file   Social Drivers of Health   Financial Resource Strain: Not on file  Food Insecurity: Not on file  Transportation Needs: Not on file  Physical Activity: Not on file  Stress: Not on file  Social Connections: Not on file   Past Surgical History:  Procedure Laterality Date   CARDIAC CATHETERIZATION  03/21/2015   Procedure: CORONARY STENT INTERVENTION;  Surgeon: Ozell Fell, MD;  Location:  [facility] CATH LAB;  Service: Cardiovascular;;  diag bms 2.75 x 12 vision   CARDIAC CATHETERIZATION N/A 01/14/2016   Procedure: Coronary Stent Intervention;  Surgeon: Ozell Fell, MD;  Location: [facility] INVASIVE CV LAB;  Service: Cardiovascular;  Laterality: N/A;   CARDIAC CATHETERIZATION N/A 10/31/2016   Procedure: Left Heart Cath and Coronary Angiography;  Surgeon: Lonni Hanson, MD;  Location: [facility] INVASIVE CV LAB;  Service: Cardiovascular;  Laterality: N/A;   CARDIAC CATHETERIZATION N/A 10/31/2016   Procedure: Intravascular Ultrasound/IVUS;  Surgeon: Lonni Hanson, MD;  Location: [facility] INVASIVE CV LAB;  Service: Cardiovascular;  Laterality: N/A;   CORONARY ARTERY BYPASS GRAFT N/A 11/03/2016   Procedure: CORONARY ARTERY BYPASS GRAFTING (CABG) x  three, using left internal mammary artery and right leg greater saphenous vein harvested endoscopically;  Surgeon: Sudie VEAR Laine, MD;  Location: [facility] OR;  Service: Open Heart Surgery;  Laterality: N/A;   CORONARY ARTERY BYPASS GRAFT N/A 11/03/2016   Procedure: EMERGENCY REDO STERNOTOMY IN ICU, REDO CABG X 1, PLACEMENT OF BILATERAL VAD, PLACEMENT OF LEFT FEMORAL ARTERIAL LINE;  Surgeon: Sudie VEAR Laine, MD;  Location: [facility] OR;  Service: Open Heart Surgery;  Laterality: N/A;   CORONARY STENT PLACEMENT  03/21/2015   first diagonal   ESOPHAGOGASTRODUODENOSCOPY (EGD) WITH PROPOFOL  N/A 06/11/2015   Procedure: ESOPHAGOGASTRODUODENOSCOPY (EGD) WITH PROPOFOL ;  Surgeon: Lamar Bunk, MD;  Location: [facility] ENDOSCOPY;  Service: Endoscopy;  Laterality: N/A;   HEMATOMA EVACUATION Left 11/07/2016   Procedure: EVACUATION HEMATOMA;  Surgeon: Maude Fleeta Ochoa, MD;  Location: [facility] OR;  Service: Thoracic;  Laterality: Left;   LEFT HEART CATHETERIZATION WITH CORONARY ANGIOGRAM N/A 03/21/2015   Procedure: LEFT HEART CATHETERIZATION WITH CORONARY ANGIOGRAM;  Surgeon: Ozell Fell, MD;  Location: [facility] CATH LAB;  Service: Cardiovascular;  Laterality: N/A;   LEFT HEART CATHETERIZATION WITH  CORONARY ANGIOGRAM N/A 04/11/2015   Procedure: LEFT HEART CATHETERIZATION WITH CORONARY ANGIOGRAM;  Surgeon: Lonni JONETTA Cash, MD;  Location: [facility] CATH LAB;  Service: Cardiovascular;  Laterality: N/A;   MULTIPLE TOOTH EXTRACTIONS     NOSE SURGERY  08/21/2023   REMOVAL OF CENTRIMAG VENTRICULAR ASSIST DEVICE N/A 11/07/2016   Procedure: REMOVAL OF CENTRIMAG VENTRICULAR ASSIST DEVICE;  Surgeon: Maude Fleeta Ochoa, MD;  Location: [facility] OR;  Service: Open Heart Surgery;  Laterality: N/A;   STERNAL CLOSURE N/A 11/07/2016   Procedure: STERNAL WASHOUT WITH STERNAL CLOSURE;  Surgeon: Maude Fleeta Ochoa, MD;  Location: [facility] OR;  Service: Thoracic;  Laterality: N/A;   TEE WITHOUT CARDIOVERSION N/A 11/03/2016   Procedure: TRANSESOPHAGEAL ECHOCARDIOGRAM (TEE);  Surgeon: Sudie VEAR Laine, MD;  Location: [facility] OR;  Service: Open Heart Surgery;  Laterality: N/A;   TEE WITHOUT CARDIOVERSION N/A 11/07/2016   Procedure: TRANSESOPHAGEAL ECHOCARDIOGRAM (TEE);  Surgeon: Maude Fleeta Ochoa, MD;  Location: [facility] OR;  Service: Thoracic;  Laterality: N/A;   WISDOM TOOTH EXTRACTION     Past Surgical History:  Procedure Laterality Date   CARDIAC CATHETERIZATION  03/21/2015   Procedure: CORONARY STENT INTERVENTION;  Surgeon: Ozell Fell, MD;  Location: [facility] CATH LAB;  Service: Cardiovascular;;  diag bms 2.75 x 12 vision   CARDIAC CATHETERIZATION N/A 01/14/2016   Procedure: Coronary Stent Intervention;  Surgeon: Ozell Fell, MD;  Location: [facility] INVASIVE CV LAB;  Service: Cardiovascular;  Laterality: N/A;   CARDIAC CATHETERIZATION N/A 10/31/2016   Procedure: Left Heart Cath and Coronary Angiography;  Surgeon: Lonni Hanson, MD;  Location: [facility] INVASIVE CV LAB;  Service: Cardiovascular;  Laterality: N/A;   CARDIAC CATHETERIZATION N/A 10/31/2016   Procedure: Intravascular Ultrasound/IVUS;  Surgeon: Lonni Hanson, MD;  Location: [facility] INVASIVE CV LAB;  Service: Cardiovascular;  Laterality: N/A;   CORONARY ARTERY BYPASS GRAFT N/A 11/03/2016    Procedure: CORONARY ARTERY BYPASS GRAFTING (CABG) x  three, using left internal mammary artery and right leg greater saphenous vein harvested endoscopically;  Surgeon: Sudie VEAR Laine, MD;  Location: [facility] OR;  Service: Open Heart Surgery;  Laterality: N/A;   CORONARY ARTERY BYPASS GRAFT N/A 11/03/2016   Procedure: EMERGENCY REDO STERNOTOMY IN ICU, REDO CABG X 1, PLACEMENT OF BILATERAL VAD, PLACEMENT OF LEFT FEMORAL ARTERIAL LINE;  Surgeon: Sudie VEAR Laine, MD;  Location: [facility] OR;  Service: Open Heart Surgery;  Laterality: N/A;   CORONARY STENT PLACEMENT  03/21/2015   first diagonal   ESOPHAGOGASTRODUODENOSCOPY (EGD) WITH PROPOFOL  N/A 06/11/2015   Procedure: ESOPHAGOGASTRODUODENOSCOPY (EGD) WITH PROPOFOL ;  Surgeon: Lamar Bunk, MD;  Location: [facility] ENDOSCOPY;  Service: Endoscopy;  Laterality: N/A;   HEMATOMA EVACUATION Left 11/07/2016   Procedure: EVACUATION HEMATOMA;  Surgeon: Maude Fleeta Ochoa, MD;  Location: [facility] OR;  Service: Thoracic;  Laterality: Left;   LEFT HEART CATHETERIZATION WITH CORONARY ANGIOGRAM N/A 03/21/2015   Procedure: LEFT HEART CATHETERIZATION WITH CORONARY ANGIOGRAM;  Surgeon: Ozell Fell, MD;  Location: [facility] CATH LAB;  Service: Cardiovascular;  Laterality: N/A;   LEFT HEART CATHETERIZATION WITH CORONARY ANGIOGRAM N/A 04/11/2015   Procedure: LEFT HEART CATHETERIZATION WITH CORONARY ANGIOGRAM;  Surgeon: Lonni JONETTA Cash, MD;  Location: [facility] CATH LAB;  Service: Cardiovascular;  Laterality: N/A;   MULTIPLE TOOTH EXTRACTIONS     NOSE SURGERY  08/21/2023   REMOVAL OF CENTRIMAG VENTRICULAR ASSIST DEVICE N/A 11/07/2016   Procedure: REMOVAL OF CENTRIMAG VENTRICULAR ASSIST DEVICE;  Surgeon: Maude Fleeta Ochoa, MD;  Location: [facility] OR;  Service: Open Heart Surgery;  Laterality: N/A;   STERNAL CLOSURE N/A 11/07/2016   Procedure: STERNAL WASHOUT WITH STERNAL CLOSURE;  Surgeon: Maude Fleeta Ochoa, MD;  Location: [facility] OR;  Service: Thoracic;  Laterality: N/A;   TEE WITHOUT CARDIOVERSION N/A 11/03/2016    Procedure: TRANSESOPHAGEAL ECHOCARDIOGRAM (TEE);  Surgeon: Sudie VEAR Laine, MD;  Location: [facility] OR;  Service: Open Heart Surgery;  Laterality: N/A;   TEE WITHOUT CARDIOVERSION N/A 11/07/2016   Procedure: TRANSESOPHAGEAL ECHOCARDIOGRAM (TEE);  Surgeon: Maude Fleeta Ochoa, MD;  Location: [facility] OR;  Service: Thoracic;  Laterality: N/A;   WISDOM TOOTH EXTRACTION     Past Medical History:  Diagnosis Date   ADHD    Anxiety    Bipolar 1 disorder (HCC)    Borderline personality disorder (HCC)    Cardiac arrest (HCC) 11/03/2016   Cardiogenic shock (HCC) 11/03/2016   Chronic pain    Cocaine use    Coronary artery disease    a. 03/2015  NSTEMI/PCI in setting of cocaine use - BMS to diagonal. b. NSTEMI 12/2015 s/o overlapping DES to Cx, residual mild RCA and LAD disease, 75% OM1.   COVID-19    Depression    Dyslipidemia    GERD (gastroesophageal reflux disease)    HLD (hyperlipidemia)    Hypertension    Migraine    MRSA (methicillin resistant staph aureus) culture positive    Osteoarthritis    Polysubstance abuse (HCC)    a. 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   LIMA to LAD, SVG to LCx, SVG to LAD, EVH via right thigh and leg   Scoliosis    TIA (transient ischemic attack)    BP 117/87 (BP Location: Left Arm, Patient Position: Sitting, Cuff Size: Normal)   Pulse (!) 104   Ht 5' 1 (1.549 m)   Wt 162 lb (73.5 kg)   SpO2 98%   BMI 30.61 kg/m   Opioid Risk Score:   Fall Risk Score:  `1  Depression screen [facility] 2/9     07/19/2024    3:04 PM 06/14/2024   12:53 PM 01/24/2015    3:54 PM  Depression screen PHQ 2/9  Decreased Interest 1 3 3   Down, Depressed, Hopeless 1 2 2   PHQ - 2 Score 2 5 5   Altered sleeping 1 3 3   Tired, decreased energy 1 3 3   Change in appetite  3 1  Feeling bad or failure about yourself  0 1 2  Trouble concentrating 2 3 3   Moving slowly or fidgety/restless 0 3 2  Suicidal thoughts 0 0 1   PHQ-9 Score 6 21 20   Difficult doing  work/chores Somewhat difficult       Data saved with a previous flowsheet row definition     Review of Systems  Musculoskeletal:  Positive for back pain.       Low back pain, right wrist pain, left knee pain  All other systems reviewed and are negative.      Objective:   Physical Exam Vitals and nursing note reviewed.  Constitutional:      Appearance: Normal appearance.  Cardiovascular:     Rate and Rhythm: Normal rate and regular rhythm.     Pulses: Normal pulses.     Heart sounds: Normal heart sounds.  Pulmonary:     Effort: Pulmonary effort is normal.     Breath sounds: Normal breath sounds.  Musculoskeletal:     Comments: Normal Muscle Bulk and Muscle Testing Reveals:  Upper Extremities: Full ROM and Muscle Strength 5/5 Bilateral AC Joint Tenderness  Lumbar Paraspinal Tenderness: L-4-L-5 Bilateral Greater Trochanter Tenderness Lower Extremities: Right: Full ROM and Muscle Strength 5/5 Left lower Extremity: Decreased ROM and Muscle Strength 5/5 Left Lower Extremity Flexion Produces Pain into her Left patella and Popliteal Fossa Arises from Table slowly Antalgic Gait     Skin:    General: Skin is warm and dry.  Neurological:     Mental Status: She is alert and oriented to person, place, and time.  Psychiatric:        Mood and Affect: Mood normal.        Behavior: Behavior normal.         Assessment and Plan: Lumbar Radiculitis: Continue HEP as Tolerated. Continue current Medication regimen. Continue to Monitor.  Acute Pain of Left Knee Pain: Will discuss with Dr Lorilee regarding MRI, she verbalizes understanding.  Fall at Home: Educated on [employer]. She verbalizes  understanding.  Chronic Pin Syndrome: Increased: Oxycodone  10 mg three times a day as needed for pain #90. We will continue the opioid monitoring program, this consists of regular clinic visits, examinations, urine drug screen, pill counts as well as use of [place]  Controlled Substance  Reporting system. A 12 month History has been reviewed on the [place]  Controlled Substance Reporting System Today.  We will continue the opioid monitoring program, this consists of regular clinic visits, examinations, urine drug screen, pill counts as well as use of [place]  Controlled Substance Reporting system. A 12 month History has been reviewed on the [place]  Controlled Substance Reporting System on 07/19/2024  F/U in 1 month

## 2024-07-24 ENCOUNTER — Telehealth: Payer: Self-pay | Admitting: Registered Nurse

## 2024-07-24 DIAGNOSIS — M25562 Pain in left knee: Secondary | ICD-10-CM

## 2024-07-24 NOTE — Telephone Encounter (Signed)
 Dr Lorilee,  Patient stating she discussed having MR on her Left knee with you. I didn't see it documented.  Are you in agreement

## 2024-07-26 DIAGNOSIS — J3489 Other specified disorders of nose and nasal sinuses: Secondary | ICD-10-CM | POA: Diagnosis not present

## 2024-07-26 DIAGNOSIS — J310 Chronic rhinitis: Secondary | ICD-10-CM | POA: Diagnosis not present

## 2024-07-26 DIAGNOSIS — M329 Systemic lupus erythematosus, unspecified: Secondary | ICD-10-CM | POA: Diagnosis not present

## 2024-07-29 NOTE — Telephone Encounter (Signed)
 Spoke with Dr Lorilee, she is in agreement with ordering Left Knee MR.  Ms. Fischler was called regarding the above, she verbalizes understanding.

## 2024-08-03 DIAGNOSIS — M3210 Systemic lupus erythematosus, organ or system involvement unspecified: Secondary | ICD-10-CM | POA: Diagnosis not present

## 2024-08-04 ENCOUNTER — Telehealth: Payer: Self-pay | Admitting: Pharmacist

## 2024-08-04 NOTE — Telephone Encounter (Signed)
 Received fax from [facility] regarding Saphnelo IV [phone]) infusion that patient received on 08/03/2024. Dose: 700mg  Labs were not drawn.  Patient tolerated infusion without complications.  Changes/concerns since last visit: none  Next Saphnelo infusion scheduled for 9/18/202  Sherry Pennant, PharmD, MPH, BCPS, CPP Clinical Pharmacist (Rheumatology and Pulmonology)

## 2024-08-08 ENCOUNTER — Ambulatory Visit
Admission: RE | Admit: 2024-08-08 | Discharge: 2024-08-08 | Disposition: A | Discharge status: Home / Self Care | Source: Ambulatory Visit | Attending: Registered Nurse | Admitting: Registered Nurse

## 2024-08-09 ENCOUNTER — Telehealth: Payer: Self-pay | Admitting: Registered Nurse

## 2024-08-09 DIAGNOSIS — G894 Chronic pain syndrome: Secondary | ICD-10-CM

## 2024-08-09 DIAGNOSIS — M25562 Pain in left knee: Secondary | ICD-10-CM

## 2024-08-09 NOTE — Telephone Encounter (Signed)
 Doctor Raulkar:  MR Results was reviewed, she seen Dr Eldonna in the past, are you okay with the above.

## 2024-08-10 NOTE — Telephone Encounter (Signed)
 MR : Reviewed with Dr Lorilee Referral placed to Dr Genelle per Dr Lorilee recommendation.  Call placed to [facility] regarding the above, she verbalizes understanding.

## 2024-08-18 ENCOUNTER — Encounter: Payer: Self-pay | Admitting: Registered Nurse

## 2024-08-18 ENCOUNTER — Encounter: Attending: Registered Nurse | Admitting: Registered Nurse

## 2024-08-18 VITALS — BP 126/85 | HR 101 | Ht 61.0 in | Wt 157.4 lb

## 2024-08-18 DIAGNOSIS — G894 Chronic pain syndrome: Secondary | ICD-10-CM | POA: Diagnosis not present

## 2024-08-18 DIAGNOSIS — Z5181 Encounter for therapeutic drug level monitoring: Secondary | ICD-10-CM | POA: Insufficient documentation

## 2024-08-18 DIAGNOSIS — Z79899 Other long term (current) drug therapy: Secondary | ICD-10-CM | POA: Diagnosis not present

## 2024-08-18 DIAGNOSIS — M25562 Pain in left knee: Secondary | ICD-10-CM | POA: Insufficient documentation

## 2024-08-18 DIAGNOSIS — M5416 Radiculopathy, lumbar region: Secondary | ICD-10-CM | POA: Insufficient documentation

## 2024-08-18 MED ORDER — OXYCODONE HCL 10 MG PO TABS: 10.0000 mg | ORAL_TABLET | Freq: Three times a day (TID) | ORAL | 0 refills | Status: DC | PRN

## 2024-08-18 NOTE — Progress Notes (Signed)
 Subjective:    Patient ID: Christina Morrison, female    DOB: [birth year], 48 y.o.   MRN: [id number]  HPI: Christina Morrison is a 48 y.o. female who returns for follow up appointment for chronic pain and medication refill. She states her pain is located in her lower back radiating into her left patella and left knee pain. She rates her pain 9. Her current exercise regime is walking short distances  and performing stretching exercises.  [name] Morphine  equivalent is 45.00 MME.   Last UDS was Performed on 06/14/2024, it was     Pain Inventory Average Pain 10 Pain Right Now 9 My pain is sharp, burning, dull, stabbing, tingling, and aching  In the last 24 hours, has pain interfered with the following? General activity 7 Relation with others 8 Enjoyment of life 5 What TIME of day is your pain at its worst? morning  and evening Sleep (in general) Fair  Pain is worse with: walking, bending, sitting, inactivity, standing, and some activites Pain improves with: medication Relief from Meds: 3  Family History  Problem Relation Age of Onset   Hypertension Mother    Hyperlipidemia Mother    Depression Mother    Rheum arthritis Mother    Psoriasis Mother    Hypertension Father    Hyperlipidemia Father    Heart disease Father    Diabetes Father    Kidney disease Father    Prostate cancer Father    Stroke Father    Kidney disease Sister    Bipolar disorder Sister    Varicose Veins Brother    Stroke Maternal Grandmother    Anxiety disorder Daughter    Anxiety disorder Daughter    Cancer Maternal Aunt    Social History   Socioeconomic History   Marital status: Divorced    Spouse name: Not on file   Number of children: Not on file   Years of education: Not on file   Highest education level: Not on file  Occupational History   Not on file  Tobacco Use   Smoking status: Former    Current packs/day: 0.00    Average packs/day: 0.1 packs/day for 30.0 years (3.0 ttl pk-yrs)    Types:  Cigarettes    Start date: 15    Quit date: 2022    Years since quitting: 3.6    Passive exposure: Never   Smokeless tobacco: Never  Vaping Use   Vaping status: Every Day   Substances: Nicotine , Flavoring  Substance and Sexual Activity   Alcohol use: No    Alcohol/week: 0.0 standard drinks of alcohol   Drug use: Not Currently    Types: Cocaine, Marijuana    Comment: Last use cocaine & marijuana yrs ago,   Sexual activity: Not Currently    Comment: perimenopausal  Other Topics Concern   Not on file  Social History Narrative   Not on file   Social Drivers of Health   Financial Resource Strain: Not on file  Food Insecurity: Not on file  Transportation Needs: Not on file  Physical Activity: Not on file  Stress: Not on file  Social Connections: Not on file   Past Surgical History:  Procedure Laterality Date   CARDIAC CATHETERIZATION  03/21/2015   Procedure: CORONARY STENT INTERVENTION;  Surgeon: Ozell Fell, MD;  Location: [facility] CATH LAB;  Service: Cardiovascular;;  diag bms 2.75 x 12 vision   CARDIAC CATHETERIZATION N/A 01/14/2016   Procedure: Coronary Stent Intervention;  Surgeon: Ozell Fell, MD;  Location: [facility] INVASIVE CV LAB;  Service: Cardiovascular;  Laterality: N/A;   CARDIAC CATHETERIZATION N/A 10/31/2016   Procedure: Left Heart Cath and Coronary Angiography;  Surgeon: Lonni Hanson, MD;  Location: [facility] INVASIVE CV LAB;  Service: Cardiovascular;  Laterality: N/A;   CARDIAC CATHETERIZATION N/A 10/31/2016   Procedure: Intravascular Ultrasound/IVUS;  Surgeon: Lonni Hanson, MD;  Location: [facility] INVASIVE CV LAB;  Service: Cardiovascular;  Laterality: N/A;   CORONARY ARTERY BYPASS GRAFT N/A 11/03/2016   Procedure: CORONARY ARTERY BYPASS GRAFTING (CABG) x  three, using left internal mammary artery and right leg greater saphenous vein harvested endoscopically;  Surgeon: Sudie VEAR Laine, MD;  Location: [facility] OR;  Service: Open Heart Surgery;  Laterality: N/A;   CORONARY ARTERY  BYPASS GRAFT N/A 11/03/2016   Procedure: EMERGENCY REDO STERNOTOMY IN ICU, REDO CABG X 1, PLACEMENT OF BILATERAL VAD, PLACEMENT OF LEFT FEMORAL ARTERIAL LINE;  Surgeon: Sudie VEAR Laine, MD;  Location: [facility] OR;  Service: Open Heart Surgery;  Laterality: N/A;   CORONARY STENT PLACEMENT  03/21/2015   first diagonal   ESOPHAGOGASTRODUODENOSCOPY (EGD) WITH PROPOFOL  N/A 06/11/2015   Procedure: ESOPHAGOGASTRODUODENOSCOPY (EGD) WITH PROPOFOL ;  Surgeon: Lamar Bunk, MD;  Location: [facility] ENDOSCOPY;  Service: Endoscopy;  Laterality: N/A;   HEMATOMA EVACUATION Left 11/07/2016   Procedure: EVACUATION HEMATOMA;  Surgeon: Maude Fleeta Ochoa, MD;  Location: [facility] OR;  Service: Thoracic;  Laterality: Left;   LEFT HEART CATHETERIZATION WITH CORONARY ANGIOGRAM N/A 03/21/2015   Procedure: LEFT HEART CATHETERIZATION WITH CORONARY ANGIOGRAM;  Surgeon: Ozell Fell, MD;  Location: [facility] CATH LAB;  Service: Cardiovascular;  Laterality: N/A;   LEFT HEART CATHETERIZATION WITH CORONARY ANGIOGRAM N/A 04/11/2015   Procedure: LEFT HEART CATHETERIZATION WITH CORONARY ANGIOGRAM;  Surgeon: Lonni JONETTA Cash, MD;  Location: [facility] CATH LAB;  Service: Cardiovascular;  Laterality: N/A;   MULTIPLE TOOTH EXTRACTIONS     NOSE SURGERY  08/21/2023   REMOVAL OF CENTRIMAG VENTRICULAR ASSIST DEVICE N/A 11/07/2016   Procedure: REMOVAL OF CENTRIMAG VENTRICULAR ASSIST DEVICE;  Surgeon: Maude Fleeta Ochoa, MD;  Location[facility] OR;  Service: Open Heart Surgery;  Laterality: N/A;   STERNAL CLOSURE N/A 11/07/2016   Procedure: STERNAL WASHOUT WITH STERNAL CLOSURE;  Surgeon: Maude Fleeta Ochoa, MD;  Location: [facility] OR;  Service: Thoracic;  Laterality: N/A;   TEE WITHOUT CARDIOVERSION N/A 11/03/2016   Procedure: TRANSESOPHAGEAL ECHOCARDIOGRAM (TEE);  Surgeon: Sudie VEAR Laine, MD;  Location: [facility] OR;  Service: Open Heart Surgery;  Laterality: N/A;   TEE WITHOUT CARDIOVERSION N/A 11/07/2016   Procedure: TRANSESOPHAGEAL ECHOCARDIOGRAM (TEE);  Surgeon: Maude Fleeta Ochoa, MD;   Location: [facility] OR;  Service: Thoracic;  Laterality: N/A;   WISDOM TOOTH EXTRACTION     Past Surgical History:  Procedure Laterality Date   CARDIAC CATHETERIZATION  03/21/2015   Procedure: CORONARY STENT INTERVENTION;  Surgeon: Ozell Fell, MD;  Location[facility] CATH LAB;  Service: Cardiovascular;;  diag bms 2.75 x 12 vision   CARDIAC CATHETERIZATION N/A 01/14/2016   Procedure: Coronary Stent Intervention;  Surgeon: Ozell Fell, MD;  Location: [facility] INVASIVE CV LAB;  Service: Cardiovascular;  Laterality: N/A;   CARDIAC CATHETERIZATION N/A 10/31/2016   Procedure: Left Heart Cath and Coronary Angiography;  Surgeon: Lonni Hanson, MD;  Location: [facility] INVASIVE CV LAB;  Service: Cardiovascular;  Laterality: N/A;   CARDIAC CATHETERIZATION N/A 10/31/2016   Procedure: Intravascular Ultrasound/IVUS;  Surgeon: Lonni Hanson, MD;  Location: [facility] INVASIVE CV LAB;  Service: Cardiovascular;  Laterality: N/A;   CORONARY ARTERY BYPASS GRAFT N/A 11/03/2016   Procedure: CORONARY ARTERY  BYPASS GRAFTING (CABG) x  three, using left internal mammary artery and right leg greater saphenous vein harvested endoscopically;  Surgeon: Sudie VEAR Laine, MD;  Location: [facility] OR;  Service: Open Heart Surgery;  Laterality: N/A;   CORONARY ARTERY BYPASS GRAFT N/A 11/03/2016   Procedure: EMERGENCY REDO STERNOTOMY IN ICU, REDO CABG X 1, PLACEMENT OF BILATERAL VAD, PLACEMENT OF LEFT FEMORAL ARTERIAL LINE;  Surgeon: Sudie VEAR Laine, MD;  Location: [facility] OR;  Service: Open Heart Surgery;  Laterality: N/A;   CORONARY STENT PLACEMENT  03/21/2015   first diagonal   ESOPHAGOGASTRODUODENOSCOPY (EGD) WITH PROPOFOL  N/A 06/11/2015   Procedure: ESOPHAGOGASTRODUODENOSCOPY (EGD) WITH PROPOFOL ;  Surgeon: Lamar Bunk, MD;  Location: [facility] ENDOSCOPY;  Service: Endoscopy;  Laterality: N/A;   HEMATOMA EVACUATION Left 11/07/2016   Procedure: EVACUATION HEMATOMA;  Surgeon: Maude Fleeta Ochoa, MD;  Location: [facility] OR;  Service: Thoracic;  Laterality: Left;   LEFT  HEART CATHETERIZATION WITH CORONARY ANGIOGRAM N/A 03/21/2015   Procedure: LEFT HEART CATHETERIZATION WITH CORONARY ANGIOGRAM;  Surgeon: Ozell Fell, MD;  Location: [facility] CATH LAB;  Service: Cardiovascular;  Laterality: N/A;   LEFT HEART CATHETERIZATION WITH CORONARY ANGIOGRAM N/A 04/11/2015   Procedure: LEFT HEART CATHETERIZATION WITH CORONARY ANGIOGRAM;  Surgeon: Lonni JONETTA Cash, MD;  Location: [facility] CATH LAB;  Service: Cardiovascular;  Laterality: N/A;   MULTIPLE TOOTH EXTRACTIONS     NOSE SURGERY  08/21/2023   REMOVAL OF CENTRIMAG VENTRICULAR ASSIST DEVICE N/A 11/07/2016   Procedure: REMOVAL OF CENTRIMAG VENTRICULAR ASSIST DEVICE;  Surgeon: Maude Fleeta Ochoa, MD;  Location: [facility] OR;  Service: Open Heart Surgery;  Laterality: N/A;   STERNAL CLOSURE N/A 11/07/2016   Procedure: STERNAL WASHOUT WITH STERNAL CLOSURE;  Surgeon: Maude Fleeta Ochoa, MD;  Location: [facility] OR;  Service: Thoracic;  Laterality: N/A;   TEE WITHOUT CARDIOVERSION N/A 11/03/2016   Procedure: TRANSESOPHAGEAL ECHOCARDIOGRAM (TEE);  Surgeon: Sudie VEAR Laine, MD;  Location: [facility] OR;  Service: Open Heart Surgery;  Laterality: N/A;   TEE WITHOUT CARDIOVERSION N/A 11/07/2016   Procedure: TRANSESOPHAGEAL ECHOCARDIOGRAM (TEE);  Surgeon: Maude Fleeta Ochoa, MD;  Location: [facility] OR;  Service: Thoracic;  Laterality: N/A;   WISDOM TOOTH EXTRACTION     Past Medical History:  Diagnosis Date   ADHD    Anxiety    Bipolar 1 disorder (HCC)    Borderline personality disorder (HCC)    Cardiac arrest (HCC) 11/03/2016   Cardiogenic shock (HCC) 11/03/2016   Chronic pain    Cocaine use    Coronary artery disease    a. 03/2015 NSTEMI/PCI in setting of cocaine use - BMS to diagonal. b. NSTEMI 12/2015 s/o overlapping DES to Cx, residual mild RCA and LAD disease, 75% OM1.   COVID-19    Depression    Dyslipidemia    GERD (gastroesophageal reflux disease)    HLD (hyperlipidemia)    Hypertension    Migraine    MRSA (methicillin resistant staph aureus) culture  positive    Osteoarthritis    Polysubstance abuse (HCC)    a. 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   LIMA to LAD, SVG to LCx, SVG to LAD, EVH via right thigh and leg   Scoliosis    TIA (transient ischemic attack)    BP 126/85   Pulse (!) 101   Ht 5' 1 (1.549 m)   Wt 157 lb 6.4 oz (71.4 kg)   SpO2 97%   BMI 29.74 kg/m   Opioid Risk Score:   Fall  Risk Score:  `1  Depression screen [facility] 2/9     08/18/2024    3:20 PM 07/19/2024    3:04 PM 06/14/2024   12:53 PM 01/24/2015    3:54 PM  Depression screen PHQ 2/9  Decreased Interest 1 1 3 3   Down, Depressed, Hopeless 1 1 2 2   PHQ - 2 Score 2 2 5 5   Altered sleeping  1 3 3   Tired, decreased energy  1 3 3   Change in appetite   3 1  Feeling bad or failure about yourself   0 1 2  Trouble concentrating  2 3 3   Moving slowly or fidgety/restless  0 3 2  Suicidal thoughts  0 0 1   PHQ-9 Score  6 21 20   Difficult doing work/chores  Somewhat difficult       Data saved with a previous flowsheet row definition     Review of Systems  Musculoskeletal:  Positive for back pain.       Left knee  All other systems reviewed and are negative.      Objective:   Physical Exam Vitals and nursing note reviewed.  Constitutional:      Appearance: Normal appearance.  Cardiovascular:     Rate and Rhythm: Normal rate and regular rhythm.     Pulses: Normal pulses.     Heart sounds: Normal heart sounds.  Pulmonary:     Effort: Pulmonary effort is normal.     Breath sounds: Normal breath sounds.  Musculoskeletal:     Comments: Normal Muscle Bulk and Muscle Testing Reveals:  Upper Extremities: Full ROM and Muscle Strength 5/5 Lumbar Paraspinal Tenderness: L-3-L-5 Bilateral Greater Trochanter Tenderness Lower Extremities: Right: Full ROM and Muscle Strength 5/5 Left Lower Extremity: Decreased ROM and Muscle Strength 5/5 Left Lower extremity Flexion Produces Pain into her Back and Left Popliteal  Fossa Arises from Table slowly Antalgic  Gait     Skin:    General: Skin is warm and dry.  Neurological:     Mental Status: She is alert and oriented to person, place, and time.          Assessment & Plan:  Lumbar Radiculitis: Continue HEP as Tolerated. Continue current Medication regimen. Continue to Monitor. 08/18/2024 Acute Pain of Left Knee Pain: She will call Ortho to schedule appointment, she verbalizes understanding.  Fall at Home: No falls since last visit. Educated on [employer]. She verbalizes understanding. 08/18/2024 Chronic Pain Syndrome: Refilled: Oxycodone  10 mg three times a day as needed for pain #90. We will continue the opioid monitoring program, this consists of regular clinic visits, examinations, urine drug screen, pill counts as well as use of [place]  Controlled Substance Reporting system. A 12 month History has been reviewed on the [place]  Controlled Substance Reporting System Today.  We will continue the opioid monitoring program, this consists of regular clinic visits, examinations, urine drug screen, pill counts as well as use of [place]  Controlled Substance Reporting system. A 12 month History has been reviewed on the [place]  Controlled Substance Reporting System on 08/18/2024   F/U in 1 month

## 2024-09-07 NOTE — Progress Notes (Signed)
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  Chart creation errors have been sought, but may not always  have  been located. Such creation errors do not reflect on  the standard of medical care.

## 2024-09-14 ENCOUNTER — Telehealth: Payer: Self-pay | Admitting: Pharmacist

## 2024-09-14 DIAGNOSIS — M3210 Systemic lupus erythematosus, organ or system involvement unspecified: Secondary | ICD-10-CM | POA: Diagnosis not present

## 2024-09-14 NOTE — Telephone Encounter (Signed)
 Received fax from [facility] regarding Saphnelo IV [phone]) infusion that patient received on 09/14/2024. Dose: 700mg  Labs were drawn through [facility]  Patient tolerated infusion without complications.  Changes/concerns since last visit: patient reports back pain and is wearing a brace  Next Saphnelo infusion scheduled for 10/12/24  Sherry Pennant, PharmD, MPH, BCPS, CPP Clinical Pharmacist [facility])

## 2024-09-19 ENCOUNTER — Telehealth: Payer: Self-pay

## 2024-09-19 ENCOUNTER — Encounter: Attending: Physical Medicine and Rehabilitation | Admitting: Physical Medicine and Rehabilitation

## 2024-09-19 VITALS — BP 137/90 | HR 102 | Ht 61.0 in | Wt 157.2 lb

## 2024-09-19 DIAGNOSIS — G8929 Other chronic pain: Secondary | ICD-10-CM | POA: Insufficient documentation

## 2024-09-19 DIAGNOSIS — M549 Dorsalgia, unspecified: Secondary | ICD-10-CM | POA: Diagnosis not present

## 2024-09-19 DIAGNOSIS — M25562 Pain in left knee: Secondary | ICD-10-CM | POA: Insufficient documentation

## 2024-09-19 MED ORDER — OXYCODONE HCL 10 MG PO TABS: 10.0000 mg | ORAL_TABLET | Freq: Three times a day (TID) | ORAL | 0 refills | Status: DC | PRN

## 2024-09-19 NOTE — Progress Notes (Signed)
 Subjective:    Patient ID: Christina Morrison, female    DOB: 1976/03/04, 48 y.o.   MRN: [id number]  HPI Mrs. Kropp is a 48 year old woman who presents to establish care for chronic back pain.  1) Chronic back pain: -extends into bilateral legs -radiates laterally down her hips -was on oxycodone  in the past but her PCP got cancer and he had to retire -she asks about Qutenza patch  2) Lupus: -she gets an IV medication that starts with a B -this medication does help  3) Left knee meniscal tear: -would like to follow with a different orthopedic surgeon  Pain Inventory Average Pain 9-10 Pain Right Now 8-9 My pain is constant, sharp, burning, dull, stabbing, tingling, and aching  In the last 24 hours, has pain interfered with the following? General activity 9 Relation with others 9 Enjoyment of life 9 What TIME of day is your pain at its worst? morning , daytime, evening, and night Sleep (in general) Fair  Pain is worse with: walking, bending, sitting, inactivity, and standing Pain improves with: rest and medication Relief from Meds: 8  ability to climb steps?  yes do you drive?  yes Do you have any goals in this area?  yes  disabled: date disabled 2017 Do you have any goals in this area?  yes  bladder control problems spasms dizziness depression anxiety loss of taste or smell  Any changes since last visit?  yes x-rays (in the [facility])  Any changes since last visit?  no    Family History  Problem Relation Age of Onset   Hypertension Mother    Hyperlipidemia Mother    Depression Mother    Rheum arthritis Mother    Psoriasis Mother    Hypertension Father    Hyperlipidemia Father    Heart disease Father    Diabetes Father    Kidney disease Father    Prostate cancer Father    Stroke Father    Kidney disease Sister    Bipolar disorder Sister    Varicose Veins Brother    Stroke Maternal Grandmother    Anxiety disorder Daughter    Anxiety  disorder Daughter    Cancer Maternal Aunt    Social History   Socioeconomic History   Marital status: Divorced    Spouse name: Not on file   Number of children: Not on file   Years of education: Not on file   Highest education level: Not on file  Occupational History   Not on file  Tobacco Use   Smoking status: Former    Current packs/day: 0.00    Average packs/day: 0.1 packs/day for 30.0 years (3.0 ttl pk-yrs)    Types: Cigarettes    Start date: 77    Quit date: 2022    Years since quitting: 3.7    Passive exposure: Never   Smokeless tobacco: Never  Vaping Use   Vaping status: Every Day   Substances: Nicotine , Flavoring  Substance and Sexual Activity   Alcohol use: No    Alcohol/week: 0.0 standard drinks of alcohol   Drug use: Not Currently    Types: Cocaine, Marijuana    Comment: Last use cocaine & marijuana yrs ago,   Sexual activity: Not Currently    Comment: perimenopausal  Other Topics Concern   Not on file  Social History Narrative   Not on file   Social Drivers of Health   Financial Resource Strain: Not on file  Food Insecurity: Not  on file  Transportation Needs: Not on file  Physical Activity: Not on file  Stress: Not on file  Social Connections: Not on file   Past Surgical History:  Procedure Laterality Date   CARDIAC CATHETERIZATION  03/21/2015   Procedure: CORONARY STENT INTERVENTION;  Surgeon: Ozell Fell, MD;  Location: [facility] CATH LAB;  Service: Cardiovascular;;  diag bms 2.75 x 12 vision   CARDIAC CATHETERIZATION N/A 01/14/2016   Procedure: Coronary Stent Intervention;  Surgeon: Ozell Fell, MD;  Location: [facility] INVASIVE CV LAB;  Service: Cardiovascular;  Laterality: N/A;   CARDIAC CATHETERIZATION N/A 10/31/2016   Procedure: Left Heart Cath and Coronary Angiography;  Surgeon: Lonni Hanson, MD;  Location: [facility] INVASIVE CV LAB;  Service: Cardiovascular;  Laterality: N/A;   CARDIAC CATHETERIZATION N/A 10/31/2016   Procedure: Intravascular  Ultrasound/IVUS;  Surgeon: Lonni Hanson, MD;  Location: [facility] INVASIVE CV LAB;  Service: Cardiovascular;  Laterality: N/A;   CORONARY ARTERY BYPASS GRAFT N/A 11/03/2016   Procedure: CORONARY ARTERY BYPASS GRAFTING (CABG) x  three, using left internal mammary artery and right leg greater saphenous vein harvested endoscopically;  Surgeon: Sudie VEAR Laine, MD;  Location: [facility] OR;  Service: Open Heart Surgery;  Laterality: N/A;   CORONARY ARTERY BYPASS GRAFT N/A 11/03/2016   Procedure: EMERGENCY REDO STERNOTOMY IN ICU, REDO CABG X 1, PLACEMENT OF BILATERAL VAD, PLACEMENT OF LEFT FEMORAL ARTERIAL LINE;  Surgeon: Sudie VEAR Laine, MD;  Location: [facility] OR;  Service: Open Heart Surgery;  Laterality: N/A;   CORONARY STENT PLACEMENT  03/21/2015   first diagonal   ESOPHAGOGASTRODUODENOSCOPY (EGD) WITH PROPOFOL  N/A 06/11/2015   Procedure: ESOPHAGOGASTRODUODENOSCOPY (EGD) WITH PROPOFOL ;  Surgeon: Lamar Bunk, MD;  Location: [facility] ENDOSCOPY;  Service: Endoscopy;  Laterality: N/A;   HEMATOMA EVACUATION Left 11/07/2016   Procedure: EVACUATION HEMATOMA;  Surgeon: Maude Fleeta Ochoa, MD;  Location: [facility] OR;  Service: Thoracic;  Laterality: Left;   LEFT HEART CATHETERIZATION WITH CORONARY ANGIOGRAM N/A 03/21/2015   Procedure: LEFT HEART CATHETERIZATION WITH CORONARY ANGIOGRAM;  Surgeon: Ozell Fell, MD;  Location: [facility] CATH LAB;  Service: Cardiovascular;  Laterality: N/A;   LEFT HEART CATHETERIZATION WITH CORONARY ANGIOGRAM N/A 04/11/2015   Procedure: LEFT HEART CATHETERIZATION WITH CORONARY ANGIOGRAM;  Surgeon: Lonni JONETTA Cash, MD;  Location: [facility] CATH LAB;  Service: Cardiovascular;  Laterality: N/A;   MULTIPLE TOOTH EXTRACTIONS     NOSE SURGERY  08/21/2023   REMOVAL OF CENTRIMAG VENTRICULAR ASSIST DEVICE N/A 11/07/2016   Procedure: REMOVAL OF CENTRIMAG VENTRICULAR ASSIST DEVICE;  Surgeon: Maude Fleeta Ochoa, MD;  Location: [facility] OR;  Service: Open Heart Surgery;  Laterality: N/A;   STERNAL CLOSURE N/A 11/07/2016    Procedure: STERNAL WASHOUT WITH STERNAL CLOSURE;  Surgeon: Maude Fleeta Ochoa, MD;  Location: [facility] OR;  Service: Thoracic;  Laterality: N/A;   TEE WITHOUT CARDIOVERSION N/A 11/03/2016   Procedure: TRANSESOPHAGEAL ECHOCARDIOGRAM (TEE);  Surgeon: Sudie VEAR Laine, MD;  Location: [facility] OR;  Service: Open Heart Surgery;  Laterality: N/A;   TEE WITHOUT CARDIOVERSION N/A 11/07/2016   Procedure: TRANSESOPHAGEAL ECHOCARDIOGRAM (TEE);  Surgeon: Maude Fleeta Ochoa, MD;  Location: [facility] OR;  Service: Thoracic;  Laterality: N/A;   WISDOM TOOTH EXTRACTION     Past Medical History:  Diagnosis Date   ADHD    Anxiety    Bipolar 1 disorder (HCC)    Borderline personality disorder (HCC)    Cardiac arrest (HCC) 11/03/2016   Cardiogenic shock (HCC) 11/03/2016   Chronic pain    Cocaine use    Coronary artery disease  a. 03/2015 NSTEMI/PCI in setting of cocaine use - BMS to diagonal. b. NSTEMI 12/2015 s/o overlapping DES to Cx, residual mild RCA and LAD disease, 75% OM1.   COVID-19    Depression    Dyslipidemia    GERD (gastroesophageal reflux disease)    HLD (hyperlipidemia)    Hypertension    Migraine    MRSA (methicillin resistant staph aureus) culture positive    Osteoarthritis    Polysubstance abuse (HCC)    a. 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   LIMA to LAD, SVG to LCx, SVG to LAD, EVH via right thigh and leg   Scoliosis    TIA (transient ischemic attack)    BP (!) 137/90 (BP Location: Left Arm, Patient Position: Sitting, Cuff Size: Normal)   Pulse (!) 102   Ht 5' 1 (1.549 m)   Wt 157 lb 3.2 oz (71.3 kg)   SpO2 98%   BMI 29.70 kg/m   Opioid Risk Score:   Fall Risk Score:  `1  Depression screen [facility] 2/9     08/18/2024    3:20 PM 07/19/2024    3:04 PM 06/14/2024   12:53 PM 01/24/2015    3:54 PM  Depression screen PHQ 2/9  Decreased Interest 1 1 3 3   Down, Depressed, Hopeless 1 1 2 2   PHQ - 2 Score 2 2 5 5   Altered sleeping  1 3 3   Tired, decreased  energy  1 3 3   Change in appetite   3 1  Feeling bad or failure about yourself   0 1 2  Trouble concentrating  2 3 3   Moving slowly or fidgety/restless  0 3 2  Suicidal thoughts  0 0 1   PHQ-9 Score  6 21 20   Difficult doing work/chores  Somewhat difficult       Data saved with a previous flowsheet row definition    Review of Systems  Genitourinary:        Bladder leakage with cough  Musculoskeletal:  Positive for back pain and myalgias.       Left knee pain, upper, mid and lower back pain. Pain the right hand, left knee pain  All other systems reviewed and are negative.      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 Discussed that this is a capsaicin patch, stronger than capsaicin cream. Discussed that it is currently approved for diabetic peripheral neuropathy and post-herpetic neuralgia, but that it has also shown benefit in treating other forms of neuropathy. Provided patient with link to site to learn more about the patch: https://www.clark.biz/. Discussed that the patch would be placed in office and benefits usually last 3 months. Discussed that unintended exposure to capsaicin can cause severe irritation of eyes, mucous membranes, respiratory tract, and skin, but that Qutenza is a local treatment and does not have the systemic side effects of other nerve medications. Discussed that there may be pain, itching, erythema, and decreased sensory function associated with the application of  Qutenza. Side  effects usually subside within 1 week. A cold pack of analgesic medications can help with these side effects. Blood pressure can also be increased due to pain associated with administration of the patch.  We are recommending Qutenza 8% capsaicin to treat this patient's pain. Qutenza is the first-line treatment option recommended for diabetic peripheral neuropathy by the AACE and ADA.   Qutenza is a safer option for this patient due to the following: Gabapentin  use has been associated with increased risk of dementia Lyrica use has been associated with increased risk of heart failure Cymblata, Venlafaxine, and other SSRIs/SNRIs are associated with increased weight gain Lidocaine  5% has been prescribed/tried   -provided referral to PCP  -Discussed current symptoms of pain and history of pain.  -Discussed benefits of exercise in reducing pain. -Discussed following foods that may reduce pain: 1) Ginger (especially studied for arthritis)- reduce leukotriene production to decrease inflammation 2) Blueberries- high in phytonutrients that decrease inflammation 3) Salmon- marine omega-3s reduce joint swelling and pain 4) Pumpkin seeds- reduce inflammation 5) dark chocolate- reduces inflammation 6) turmeric- reduces inflammation 7) tart cherries - reduce pain and stiffness 8) extra virgin olive oil - its compound olecanthal helps to block prostaglandins  9) chili peppers- can be eaten or applied topically via capsaicin 10) mint- helpful for headache, muscle aches, joint pain, and itching 11) garlic- reduces inflammation 12) Green tea- reduces inflammation and oxidative stress, helps with weight loss, may reduce the risk of cancer, recommend Double [employer] of Tea daily  Link to further information on diet for chronic pain: http://www.bray.com/   2) Bilateral knee pain/L knee meniscal tear: -Xrs ordered -referred  to ortho  3) Lupus:  -discussed that her current lupus medication prescribed by rheumatology has been helpful for her  4) Anxiety: -discussed cymbalta -discussed checking vitamin D  -Discussed exercise and meditation as tools to decrease anxiety. -Recommended Down Dog Yoga app -Discussed spending time outdoors. -Discussed positive re-framing of anxiety.  -Discussed the following foods that have been show to reduce anxiety: 1) [place] nuts, mushrooms, soy beans due to their high selenium content. Upper limit of toxicity of selenium is 435mcg/day so no more than 3-4 [place] nuts per day.  2) Fatty fish such as salmon, mackerel, sardines, trout, and herring- high in omega-3 fatty acids 3) Eggs- increases serotonin and dopamine  4) Pumpkin seeds- high in omega-3 fatty acids 5) dark chocolate- high in flavanols that increase blood flow to brain 6) turmeric- take with black pepper to increase absorption 7) chamomile tea- antioxidant and anti-inflammatory properties 8) yogurt without sugar- supports gut-brain axis 9) green tea- contains L- theanine 10) blueberries- high in vitamin C and antioxidants 11) [place]- high in tryptophan which gets converted to serotonin 12) bell peppers- rich in vitamin C and antioxidants 13) citrus fruits- rich in vitamin C and antioxidants 14) almonds- high in vitamin E and healthy fats 15) chia seeds- high in omega-3 fatty acids  5) Sinus infection: Recommended olive leaf extract

## 2024-09-19 NOTE — Telephone Encounter (Signed)
 Called and spoke with patient to make aware that we still have not rec'd approval for Qutenza patches.  Patient was given the option to reschedule or continue appointment as follow up visit.  Patient request to continue appointment with Dr. Lorilee.  Patient advised of arrival time and appointment time.

## 2024-09-19 NOTE — Patient Instructions (Signed)
Anxiety: -Discussed exercise and meditation as tools to decrease anxiety. -Recommended Down Dog Yoga app -Discussed spending time outdoors. -Discussed positive re-framing of anxiety.  -Discussed the following foods that have been show to reduce anxiety: 1) [place] nuts, mushrooms, soy beans due to their high selenium content. Upper limit of toxicity of selenium is 435mg/day so no more than 3-4 bBolivianuts per day.  2) Fatty fish such as salmon, mackerel, sardines, trout, and herring- high in omega-3 fatty acids 3) Eggs- increases serotonin and dopamine 4) Pumpkin seeds- high in omega-3 fatty acids 5) dark chocolate- high in flavanols that increase blood flow to brain 6) turmeric- take with black pepper to increase absorption 7) chamomile tea- antioxidant and anti-inflammatory properties 8) yogurt without sugar- supports gut-brain axis 9) green tea- contains L- theanine 10) blueberries- high in vitamin C and antioxidants 11) [place] high in tryptophan which gets converted to serotonin 12) bell peppers- rich in vitamin C and antioxidants 13) citrus fruits- rich in vitamin C and antioxidants 14) almonds- high in vitamin E and healthy fats 15) chia seeds- high in omega-3 fatty acids

## 2024-09-21 ENCOUNTER — Ambulatory Visit: Attending: Internal Medicine | Admitting: Internal Medicine

## 2024-09-21 ENCOUNTER — Encounter: Payer: Self-pay | Admitting: Internal Medicine

## 2024-09-21 VITALS — BP 108/67 | HR 70 | Temp 97.8°F | Resp 16 | Ht 61.0 in | Wt 160.0 lb

## 2024-09-21 DIAGNOSIS — M5441 Lumbago with sciatica, right side: Secondary | ICD-10-CM | POA: Diagnosis not present

## 2024-09-21 DIAGNOSIS — J3489 Other specified disorders of nose and nasal sinuses: Secondary | ICD-10-CM | POA: Diagnosis not present

## 2024-09-21 DIAGNOSIS — R296 Repeated falls: Secondary | ICD-10-CM | POA: Insufficient documentation

## 2024-09-21 DIAGNOSIS — Z79899 Other long term (current) drug therapy: Secondary | ICD-10-CM | POA: Diagnosis not present

## 2024-09-21 DIAGNOSIS — G8929 Other chronic pain: Secondary | ICD-10-CM | POA: Diagnosis not present

## 2024-09-21 DIAGNOSIS — M3219 Other organ or system involvement in systemic lupus erythematosus: Secondary | ICD-10-CM | POA: Insufficient documentation

## 2024-09-21 MED ORDER — AMOXICILLIN-POT CLAVULANATE 875-125 MG PO TABS: 1.0000 | ORAL_TABLET | Freq: Two times a day (BID) | ORAL | 0 refills | Status: AC

## 2024-09-21 NOTE — Patient Instructions (Signed)
 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

## 2024-09-22 LAB — CBC WITH DIFFERENTIAL/PLATELET
Absolute Lymphocytes: 1118 {cells}/uL (ref 850–3900)
Absolute Monocytes: 445 {cells}/uL (ref 200–950)
Basophils Absolute: 21 {cells}/uL (ref 0–200)
Basophils Relative: 0.4 %
Eosinophils Absolute: 254 {cells}/uL (ref 15–500)
Eosinophils Relative: 4.8 %
HCT: 34.4 % — ABNORMAL LOW (ref 35.0–45.0)
Hemoglobin: 11.9 g/dL (ref 11.7–15.5)
MCH: 29.3 pg (ref 27.0–33.0)
MCHC: 34.6 g/dL (ref 32.0–36.0)
MCV: 84.7 fL (ref 80.0–100.0)
MPV: 9.4 fL (ref 7.5–12.5)
Monocytes Relative: 8.4 %
Neutro Abs: 3461 {cells}/uL (ref 1500–7800)
Neutrophils Relative %: 65.3 %
Platelets: 245 Thousand/uL (ref 140–400)
RBC: 4.06 Million/uL (ref 3.80–5.10)
RDW: 12.8 % (ref 11.0–15.0)
Total Lymphocyte: 21.1 %
WBC: 5.3 Thousand/uL (ref 3.8–10.8)

## 2024-09-22 LAB — COMPREHENSIVE METABOLIC PANEL WITH GFR
AG Ratio: 1.6 (calc) (ref 1.0–2.5)
ALT: 7 U/L (ref 6–29)
AST: 11 U/L (ref 10–35)
Albumin: 4.2 g/dL (ref 3.6–5.1)
Alkaline phosphatase (APISO): 81 U/L (ref 31–125)
BUN: 11 mg/dL (ref 7–25)
CO2: 28 mmol/L (ref 20–32)
Calcium: 8.9 mg/dL (ref 8.6–10.2)
Chloride: 102 mmol/L (ref 98–110)
Creat: 0.86 mg/dL (ref 0.50–0.99)
Globulin: 2.6 g/dL (ref 1.9–3.7)
Glucose, Bld: 105 mg/dL — ABNORMAL HIGH (ref 65–99)
Potassium: 4.5 mmol/L (ref 3.5–5.3)
Sodium: 137 mmol/L (ref 135–146)
Total Bilirubin: 0.4 mg/dL (ref 0.2–1.2)
Total Protein: 6.8 g/dL (ref 6.1–8.1)
eGFR: 83 mL/min/1.73m2 (ref >=60)

## 2024-09-22 LAB — ANTI-DNA ANTIBODY, DOUBLE-STRANDED: ds DNA Ab: 1 [IU]/mL

## 2024-09-22 LAB — C3 AND C4
C3 Complement: 136 mg/dL (ref 83–193)
C4 Complement: 30 mg/dL (ref 15–57)

## 2024-09-22 LAB — SEDIMENTATION RATE: Sed Rate: 33 mm/h — ABNORMAL HIGH (ref 0–20)

## 2024-09-28 ENCOUNTER — Telehealth: Payer: Self-pay | Admitting: Pharmacist

## 2024-09-28 NOTE — Telephone Encounter (Signed)
 Received notification from [facility] MEDICAID regarding a prior authorization for BENLYSTA  IV (G9509). Authorization has been APPROVED from 09/22/2024 to 06/13/2025. Approval letter sent to scan center.  Authorization # [id number]  Sherry Pennant, PharmD, MPH, BCPS, CPP Clinical Pharmacist [facility])

## 2024-10-12 DIAGNOSIS — M3210 Systemic lupus erythematosus, organ or system involvement unspecified: Secondary | ICD-10-CM | POA: Diagnosis not present

## 2024-10-13 ENCOUNTER — Telehealth: Payer: Self-pay | Admitting: Pharmacist

## 2024-10-13 NOTE — Telephone Encounter (Signed)
 Received fax from [facility] regarding Benlysta  IV (G9509) infusion that patient received on 10/12/24. Dose: 710mg  Labs were not drawn.  Patient tolerated infusion without complications.  Changes/concerns since last visit: taking Augmentin  for chronic sinus infections  Next Saphnelo infusion scheduled for 11/16/2024  Sherry Pennant, PharmD, MPH, BCPS, CPP Clinical Pharmacist [facility])

## 2024-10-17 ENCOUNTER — Encounter: Payer: Self-pay | Admitting: Radiology

## 2024-10-20 ENCOUNTER — Encounter: Attending: Registered Nurse | Admitting: Registered Nurse

## 2024-10-20 ENCOUNTER — Encounter: Payer: Self-pay | Admitting: Registered Nurse

## 2024-10-20 VITALS — BP 137/88 | HR 102 | Ht 61.0 in | Wt 157.0 lb

## 2024-10-20 DIAGNOSIS — M549 Dorsalgia, unspecified: Secondary | ICD-10-CM | POA: Diagnosis not present

## 2024-10-20 DIAGNOSIS — G894 Chronic pain syndrome: Secondary | ICD-10-CM | POA: Diagnosis not present

## 2024-10-20 DIAGNOSIS — G8929 Other chronic pain: Secondary | ICD-10-CM | POA: Diagnosis present

## 2024-10-20 DIAGNOSIS — M25562 Pain in left knee: Secondary | ICD-10-CM | POA: Diagnosis not present

## 2024-10-20 DIAGNOSIS — Z79899 Other long term (current) drug therapy: Secondary | ICD-10-CM | POA: Diagnosis not present

## 2024-10-20 DIAGNOSIS — Z5181 Encounter for therapeutic drug level monitoring: Secondary | ICD-10-CM | POA: Insufficient documentation

## 2024-10-20 MED ORDER — OXYCODONE HCL 10 MG PO TABS: 10.0000 mg | ORAL_TABLET | Freq: Three times a day (TID) | ORAL | 0 refills | Status: DC | PRN

## 2024-10-20 NOTE — Progress Notes (Signed)
 Subjective:    Patient ID: Christina Morrison, female    DOB: 1976-07-17, 48 y.o.   MRN: [id number]  HPI: Christina Morrison is a 48 y.o. female who returns for follow up appointment for chronic pain and medication refill. She states  she has a headache today, lower back and left knee pain.She rates her pain 8. Her current exercise regime is walking and performing stretching exercises.  [name] reports her cousin passed away, emotional support given  [name] Morphine  equivalent is 45.00 MME. She is also prescribed Clonazepam by Idell Remington .We have discussed the black box warning of using opioids and benzodiazepines. I highlighted the dangers of using these drugs together and discussed the adverse events including respiratory suppression, overdose, cognitive impairment and importance of compliance with current regimen. We will continue to monitor and adjust as indicated.  she is being closely monitored and under the care of her psychiatrist.  Last UDS was Performed on 06/14/2024, it was consistent.    Pain Inventory Average Pain 9 Pain Right Now 8 My pain is sharp, burning, dull, stabbing, tingling, and aching  In the last 24 hours, has pain interfered with the following? General activity 9 Relation with others 9 Enjoyment of life 9 What TIME of day is your pain at its worst? morning  and evening Sleep (in general) Poor  Pain is worse with: walking, bending, sitting, inactivity, standing, some activites, and   Pain improves with: medication Relief from Meds: 5  Family History  Problem Relation Age of Onset   Hypertension Mother    Hyperlipidemia Mother    Depression Mother    Rheum arthritis Mother    Psoriasis Mother    Hypertension Father    Hyperlipidemia Father    Heart disease Father    Diabetes Father    Kidney disease Father    Prostate cancer Father    Stroke Father    Kidney disease Sister    Bipolar disorder Sister    Varicose Veins Brother    Stroke Maternal  Grandmother    Anxiety disorder Daughter    Anxiety disorder Daughter    Cancer Maternal Aunt    Social History   Socioeconomic History   Marital status: Divorced    Spouse name: Not on file   Number of children: Not on file   Years of education: Not on file   Highest education level: Not on file  Occupational History   Not on file  Tobacco Use   Smoking status: Former    Current packs/day: 0.00    Average packs/day: 0.1 packs/day for 30.0 years (3.0 ttl pk-yrs)    Types: Cigarettes    Start date: 18    Quit date: 2022    Years since quitting: 3.8    Passive exposure: Never   Smokeless tobacco: Never  Vaping Use   Vaping status: Every Day   Substances: Nicotine , Flavoring  Substance and Sexual Activity   Alcohol use: No    Alcohol/week: 0.0 standard drinks of alcohol   Drug use: Not Currently    Types: Cocaine, Marijuana    Comment: Last use cocaine & marijuana yrs ago,   Sexual activity: Not Currently    Comment: perimenopausal  Other Topics Concern   Not on file  Social History Narrative   Not on file   Social Drivers of Health   Financial Resource Strain: Not on file  Food Insecurity: Not on file  Transportation Needs: Not on file  Physical Activity: Not  on file  Stress: Not on file  Social Connections: Not on file   Past Surgical History:  Procedure Laterality Date   CARDIAC CATHETERIZATION  03/21/2015   Procedure: CORONARY STENT INTERVENTION;  Surgeon: Ozell Fell, MD;  Location: [facility] CATH LAB;  Service: Cardiovascular;;  diag bms 2.75 x 12 vision   CARDIAC CATHETERIZATION N/A 01/14/2016   Procedure: Coronary Stent Intervention;  Surgeon: Ozell Fell, MD;  Location: [facility] INVASIVE CV LAB;  Service: Cardiovascular;  Laterality: N/A;   CARDIAC CATHETERIZATION N/A 10/31/2016   Procedure: Left Heart Cath and Coronary Angiography;  Surgeon: Lonni Hanson, MD;  Location: [facility] INVASIVE CV LAB;  Service: Cardiovascular;  Laterality: N/A;   CARDIAC  CATHETERIZATION N/A 10/31/2016   Procedure: Intravascular Ultrasound/IVUS;  Surgeon: Lonni Hanson, MD;  Location: [facility] INVASIVE CV LAB;  Service: Cardiovascular;  Laterality: N/A;   CORONARY ARTERY BYPASS GRAFT N/A 11/03/2016   Procedure: CORONARY ARTERY BYPASS GRAFTING (CABG) x  three, using left internal mammary artery and right leg greater saphenous vein harvested endoscopically;  Surgeon: Sudie VEAR Laine, MD;  Location: [facility] OR;  Service: Open Heart Surgery;  Laterality: N/A;   CORONARY ARTERY BYPASS GRAFT N/A 11/03/2016   Procedure: EMERGENCY REDO STERNOTOMY IN ICU, REDO CABG X 1, PLACEMENT OF BILATERAL VAD, PLACEMENT OF LEFT FEMORAL ARTERIAL LINE;  Surgeon: Sudie VEAR Laine, MD;  Location: [facility] OR;  Service: Open Heart Surgery;  Laterality: N/A;   CORONARY STENT PLACEMENT  03/21/2015   first diagonal   ESOPHAGOGASTRODUODENOSCOPY (EGD) WITH PROPOFOL  N/A 06/11/2015   Procedure: ESOPHAGOGASTRODUODENOSCOPY (EGD) WITH PROPOFOL ;  Surgeon: Lamar Bunk, MD;  Location: [facility] ENDOSCOPY;  Service: Endoscopy;  Laterality: N/A;   HEMATOMA EVACUATION Left 11/07/2016   Procedure: EVACUATION HEMATOMA;  Surgeon: Maude Fleeta Ochoa, MD;  Location: [facility] OR;  Service: Thoracic;  Laterality: Left;   LEFT HEART CATHETERIZATION WITH CORONARY ANGIOGRAM N/A 03/21/2015   Procedure: LEFT HEART CATHETERIZATION WITH CORONARY ANGIOGRAM;  Surgeon: Ozell Fell, MD;  Location: [facility] CATH LAB;  Service: Cardiovascular;  Laterality: N/A;   LEFT HEART CATHETERIZATION WITH CORONARY ANGIOGRAM N/A 04/11/2015   Procedure: LEFT HEART CATHETERIZATION WITH CORONARY ANGIOGRAM;  Surgeon: Lonni JONETTA Cash, MD;  Location: [facility] CATH LAB;  Service: Cardiovascular;  Laterality: N/A;   MULTIPLE TOOTH EXTRACTIONS     NOSE SURGERY  08/21/2023   REMOVAL OF CENTRIMAG VENTRICULAR ASSIST DEVICE N/A 11/07/2016   Procedure: REMOVAL OF CENTRIMAG VENTRICULAR ASSIST DEVICE;  Surgeon: Maude Fleeta Ochoa, MD;  Location: [facility] OR;  Service: Open Heart Surgery;   Laterality: N/A;   STERNAL CLOSURE N/A 11/07/2016   Procedure: STERNAL WASHOUT WITH STERNAL CLOSURE;  Surgeon: Maude Fleeta Ochoa, MD;  Location: [facility] OR;  Service: Thoracic;  Laterality: N/A;   TEE WITHOUT CARDIOVERSION N/A 11/03/2016   Procedure: TRANSESOPHAGEAL ECHOCARDIOGRAM (TEE);  Surgeon: Sudie VEAR Laine, MD;  Location: [facility] OR;  Service: Open Heart Surgery;  Laterality: N/A;   TEE WITHOUT CARDIOVERSION N/A 11/07/2016   Procedure: TRANSESOPHAGEAL ECHOCARDIOGRAM (TEE);  Surgeon: Maude Fleeta Ochoa, MD;  Location: [facility] OR;  Service: Thoracic;  Laterality: N/A;   WISDOM TOOTH EXTRACTION     Past Surgical History:  Procedure Laterality Date   CARDIAC CATHETERIZATION  03/21/2015   Procedure: CORONARY STENT INTERVENTION;  Surgeon: Ozell Fell, MD;  Location: [facility] CATH LAB;  Service: Cardiovascular;;  diag bms 2.75 x 12 vision   CARDIAC CATHETERIZATION N/A 01/14/2016   Procedure: Coronary Stent Intervention;  Surgeon: Ozell Fell, MD;  Location: [facility] INVASIVE CV LAB;  Service: Cardiovascular;  Laterality: N/A;  CARDIAC CATHETERIZATION N/A 10/31/2016   Procedure: Left Heart Cath and Coronary Angiography;  Surgeon: Lonni Hanson, MD;  Location: [facility] INVASIVE CV LAB;  Service: Cardiovascular;  Laterality: N/A;   CARDIAC CATHETERIZATION N/A 10/31/2016   Procedure: Intravascular Ultrasound/IVUS;  Surgeon: Lonni Hanson, MD;  Location: [facility] INVASIVE CV LAB;  Service: Cardiovascular;  Laterality: N/A;   CORONARY ARTERY BYPASS GRAFT N/A 11/03/2016   Procedure: CORONARY ARTERY BYPASS GRAFTING (CABG) x  three, using left internal mammary artery and right leg greater saphenous vein harvested endoscopically;  Surgeon: Sudie VEAR Laine, MD;  Location: [facility] OR;  Service: Open Heart Surgery;  Laterality: N/A;   CORONARY ARTERY BYPASS GRAFT N/A 11/03/2016   Procedure: EMERGENCY REDO STERNOTOMY IN ICU, REDO CABG X 1, PLACEMENT OF BILATERAL VAD, PLACEMENT OF LEFT FEMORAL ARTERIAL LINE;  Surgeon: Sudie VEAR Laine, MD;   Location: [facility] OR;  Service: Open Heart Surgery;  Laterality: N/A;   CORONARY STENT PLACEMENT  03/21/2015   first diagonal   ESOPHAGOGASTRODUODENOSCOPY (EGD) WITH PROPOFOL  N/A 06/11/2015   Procedure: ESOPHAGOGASTRODUODENOSCOPY (EGD) WITH PROPOFOL ;  Surgeon: Lamar Bunk, MD;  Location: [facility] ENDOSCOPY;  Service: Endoscopy;  Laterality: N/A;   HEMATOMA EVACUATION Left 11/07/2016   Procedure: EVACUATION HEMATOMA;  Surgeon: Maude Fleeta Ochoa, MD;  Location: [facility] OR;  Service: Thoracic;  Laterality: Left;   LEFT HEART CATHETERIZATION WITH CORONARY ANGIOGRAM N/A 03/21/2015   Procedure: LEFT HEART CATHETERIZATION WITH CORONARY ANGIOGRAM;  Surgeon: Ozell Fell, MD;  Location: [facility] CATH LAB;  Service: Cardiovascular;  Laterality: N/A;   LEFT HEART CATHETERIZATION WITH CORONARY ANGIOGRAM N/A 04/11/2015   Procedure: LEFT HEART CATHETERIZATION WITH CORONARY ANGIOGRAM;  Surgeon: Lonni JONETTA Cash, MD;  Location: [facility] CATH LAB;  Service: Cardiovascular;  Laterality: N/A;   MULTIPLE TOOTH EXTRACTIONS     NOSE SURGERY  08/21/2023   REMOVAL OF CENTRIMAG VENTRICULAR ASSIST DEVICE N/A 11/07/2016   Procedure: REMOVAL OF CENTRIMAG VENTRICULAR ASSIST DEVICE;  Surgeon: Maude Fleeta Ochoa, MD;  Location: [facility] OR;  Service: Open Heart Surgery;  Laterality: N/A;   STERNAL CLOSURE N/A 11/07/2016   Procedure: STERNAL WASHOUT WITH STERNAL CLOSURE;  Surgeon: Maude Fleeta Ochoa, MD;  Location: [facility] OR;  Service: Thoracic;  Laterality: N/A;   TEE WITHOUT CARDIOVERSION N/A 11/03/2016   Procedure: TRANSESOPHAGEAL ECHOCARDIOGRAM (TEE);  Surgeon: Sudie VEAR Laine, MD;  Location: [facility] OR;  Service: Open Heart Surgery;  Laterality: N/A;   TEE WITHOUT CARDIOVERSION N/A 11/07/2016   Procedure: TRANSESOPHAGEAL ECHOCARDIOGRAM (TEE);  Surgeon: Maude Fleeta Ochoa, MD;  Location: [facility] OR;  Service: Thoracic;  Laterality: N/A;   WISDOM TOOTH EXTRACTION     Past Medical History:  Diagnosis Date   ADHD    Anxiety    Bipolar 1 disorder (HCC)    Borderline  personality disorder (HCC)    Cardiac arrest (HCC) 11/03/2016   Cardiogenic shock (HCC) 11/03/2016   Chronic pain    Cocaine use    Coronary artery disease    a. 03/2015 NSTEMI/PCI in setting of cocaine use - BMS to diagonal. b. NSTEMI 12/2015 s/o overlapping DES to Cx, residual mild RCA and LAD disease, 75% OM1.   COVID-19    Depression    Dyslipidemia    GERD (gastroesophageal reflux disease)    HLD (hyperlipidemia)    Hypertension    Migraine    MRSA (methicillin resistant staph aureus) culture positive    Osteoarthritis    Polysubstance abuse (HCC)    a. 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   LIMA to LAD, SVG to LCx, SVG to LAD, EVH via right thigh and leg   Scoliosis    TIA (transient ischemic attack)    BP 137/88 (BP Location: Left Arm, Patient Position: Sitting, Cuff Size: Normal)   Pulse (!) 102   Ht 5' 1 (1.549 m)   Wt 157 lb (71.2 kg)   LMP 08/31/2024   SpO2 98%   BMI 29.66 kg/m   Opioid Risk Score:   Fall Risk Score:  `1  Depression screen [facility] 2/9     08/18/2024    3:20 PM 07/19/2024    3:04 PM 06/14/2024   12:53 PM 01/24/2015    3:54 PM  Depression screen PHQ 2/9  Decreased Interest 1 1 3 3   Down, Depressed, Hopeless 1 1 2 2   PHQ - 2 Score 2 2 5 5   Altered sleeping  1 3 3   Tired, decreased energy  1 3 3   Change in appetite   3 1  Feeling bad or failure about yourself   0 1 2  Trouble concentrating  2 3 3   Moving slowly or fidgety/restless  0 3 2  Suicidal thoughts  0 0 1   PHQ-9 Score  6  21  20    Difficult doing work/chores  Somewhat difficult       Data saved with a previous flowsheet row definition      Review of Systems  Musculoskeletal:  Positive for arthralgias, back pain, myalgias and neck pain.       Neck pain, upper, middle and lower back pain, left knee pain  All other systems reviewed and are negative.      Objective:   Physical Exam Vitals and nursing note reviewed.  Constitutional:       Appearance: Normal appearance.  Cardiovascular:     Rate and Rhythm: Normal rate and regular rhythm.     Pulses: Normal pulses.     Heart sounds: Normal heart sounds.  Pulmonary:     Effort: Pulmonary effort is normal.     Breath sounds: Normal breath sounds.  Musculoskeletal:     Comments: Normal Muscle Bulk and Muscle Testing Reveals:  Upper Extremities: Full ROM and Muscle Strength 5/5  Thoracic Paraspinal Tenderness: T-1-T-2 Lumbar Paraspinal Tenderness: L-3-L-5 Lower Extremities: Full ROM and Muscle Strength 5/5 Arises from Table slowly Narrow Based  Gait     Skin:    General: Skin is warm and dry.  Neurological:     Mental Status: She is alert and oriented to person, place, and time.  Psychiatric:        Mood and Affect: Mood normal.        Behavior: Behavior normal.          Assessment & Plan:  Lumbar Radiculitis: Continue HEP as Tolerated. Continue current Medication regimen. Continue to Monitor. 10/20/2024 Chronic Pain of Left Knee Pain: She has a scheduled appointment with Ortho. 11/.05/2024 Fall at Home: No falls since last visit. Educated on [employer]. She verbalizes understanding. 10/20/2024 Chronic Pain Syndrome: Refilled: Oxycodone  10 mg three times a day as needed for pain #90. We will continue the opioid monitoring program, this consists of regular clinic visits, examinations, urine drug screen, pill counts as well as use of [place]  Controlled Substance Reporting system. A 12 month History has been reviewed on the [place]  Controlled Substance Reporting System Today.  We will continue the opioid monitoring program, this consists of regular clinic visits, examinations, urine drug screen,  pill counts as well as use of [place]  Controlled Substance Reporting system. A 12 month History has been reviewed on the [place]  Controlled Substance Reporting System on 10/20/2024   F/U in 1 month

## 2024-11-01 ENCOUNTER — Encounter (HOSPITAL_BASED_OUTPATIENT_CLINIC_OR_DEPARTMENT_OTHER): Admitting: Certified Nurse Midwife

## 2024-11-02 ENCOUNTER — Ambulatory Visit (INDEPENDENT_AMBULATORY_CARE_PROVIDER_SITE_OTHER): Admitting: Orthopaedic Surgery

## 2024-11-02 DIAGNOSIS — S83272A Complex tear of lateral meniscus, current injury, left knee, initial encounter: Secondary | ICD-10-CM | POA: Diagnosis not present

## 2024-11-02 NOTE — Progress Notes (Signed)
 Chief Complaint: Left knee pain     History of Present Illness:    Christina Morrison is a 48 y.o. female presents with ongoing left knee pain after an injury where she fell up the steps and landed directly on the knees 3 weeks prior to her MRI.  She did have an MRI obtained which did show evidence of lateral meniscal injury.  She states that over the last couple days she has been feeling dramatically better.  She has been walking her dog without issue    PMH/PSH/Family History/Social History/Meds/Allergies:    Past Medical History:  Diagnosis Date   ADHD    Anxiety    Bipolar 1 disorder (HCC)    Borderline personality disorder (HCC)    Cardiac arrest (HCC) 11/03/2016   Cardiogenic shock (HCC) 11/03/2016   Chronic pain    Cocaine use    Coronary artery disease    a. 03/2015 NSTEMI/PCI in setting of cocaine use - BMS to diagonal. b. NSTEMI 12/2015 s/o overlapping DES to Cx, residual mild RCA and LAD disease, 75% OM1.   COVID-19    Depression    Dyslipidemia    GERD (gastroesophageal reflux disease)    HLD (hyperlipidemia)    Hypertension    Migraine    MRSA (methicillin resistant staph aureus) culture positive    Osteoarthritis    Polysubstance abuse (HCC)    a. 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   LIMA to LAD, SVG to LCx, SVG to LAD, EVH via right thigh and leg   Scoliosis    TIA (transient ischemic attack)    Past Surgical History:  Procedure Laterality Date   CARDIAC CATHETERIZATION  03/21/2015   Procedure: CORONARY STENT INTERVENTION;  Surgeon: Ozell Fell, MD;  Location: [facility] CATH LAB;  Service: Cardiovascular;;  diag bms 2.75 x 12 vision   CARDIAC CATHETERIZATION N/A 01/14/2016   Procedure: Coronary Stent Intervention;  Surgeon: Ozell Fell, MD;  Location: [facility] INVASIVE CV LAB;  Service: Cardiovascular;  Laterality: N/A;   CARDIAC CATHETERIZATION N/A 10/31/2016   Procedure: Left Heart Cath and Coronary Angiography;   Surgeon: Lonni Hanson, MD;  Location: [facility] INVASIVE CV LAB;  Service: Cardiovascular;  Laterality: N/A;   CARDIAC CATHETERIZATION N/A 10/31/2016   Procedure: Intravascular Ultrasound/IVUS;  Surgeon: Lonni Hanson, MD;  Location: [facility] INVASIVE CV LAB;  Service: Cardiovascular;  Laterality: N/A;   CORONARY ARTERY BYPASS GRAFT N/A 11/03/2016   Procedure: CORONARY ARTERY BYPASS GRAFTING (CABG) x  three, using left internal mammary artery and right leg greater saphenous vein harvested endoscopically;  Surgeon: Sudie VEAR Laine, MD;  Location: [facility] OR;  Service: Open Heart Surgery;  Laterality: N/A;   CORONARY ARTERY BYPASS GRAFT N/A 11/03/2016   Procedure: EMERGENCY REDO STERNOTOMY IN ICU, REDO CABG X 1, PLACEMENT OF BILATERAL VAD, PLACEMENT OF LEFT FEMORAL ARTERIAL LINE;  Surgeon: Sudie VEAR Laine, MD;  Location: [facility] OR;  Service: Open Heart Surgery;  Laterality: N/A;   CORONARY STENT PLACEMENT  03/21/2015   first diagonal   ESOPHAGOGASTRODUODENOSCOPY (EGD) WITH PROPOFOL  N/A 06/11/2015   Procedure: ESOPHAGOGASTRODUODENOSCOPY (EGD) WITH PROPOFOL ;  Surgeon: Lamar Bunk, MD;  Location: [facility] ENDOSCOPY;  Service: Endoscopy;  Laterality: N/A;   HEMATOMA EVACUATION Left 11/07/2016   Procedure: EVACUATION HEMATOMA;  Surgeon: Maude Fleeta Ochoa, MD;  Location: [facility] OR;  Service: Thoracic;  Laterality: Left;   LEFT HEART CATHETERIZATION WITH CORONARY ANGIOGRAM N/A 03/21/2015   Procedure: LEFT HEART CATHETERIZATION WITH CORONARY ANGIOGRAM;  Surgeon: Ozell Fell, MD;  Location: [facility] CATH LAB;  Service: Cardiovascular;  Laterality: N/A;   LEFT HEART CATHETERIZATION WITH CORONARY ANGIOGRAM N/A 04/11/2015   Procedure: LEFT HEART CATHETERIZATION WITH CORONARY ANGIOGRAM;  Surgeon: Lonni JONETTA Cash, MD;  Location: [facility] CATH LAB;  Service: Cardiovascular;  Laterality: N/A;   MULTIPLE TOOTH EXTRACTIONS     NOSE SURGERY  08/21/2023   REMOVAL OF CENTRIMAG VENTRICULAR ASSIST DEVICE N/A 11/07/2016   Procedure: REMOVAL OF  CENTRIMAG VENTRICULAR ASSIST DEVICE;  Surgeon: Maude Fleeta Ochoa, MD;  Location: [facility] OR;  Service: Open Heart Surgery;  Laterality: N/A;   STERNAL CLOSURE N/A 11/07/2016   Procedure: STERNAL WASHOUT WITH STERNAL CLOSURE;  Surgeon: Maude Fleeta Ochoa, MD;  Location: [facility] OR;  Service: Thoracic;  Laterality: N/A;   TEE WITHOUT CARDIOVERSION N/A 11/03/2016   Procedure: TRANSESOPHAGEAL ECHOCARDIOGRAM (TEE);  Surgeon: Sudie VEAR Laine, MD;  Location: [facility] OR;  Service: Open Heart Surgery;  Laterality: N/A;   TEE WITHOUT CARDIOVERSION N/A 11/07/2016   Procedure: TRANSESOPHAGEAL ECHOCARDIOGRAM (TEE);  Surgeon: Maude Fleeta Ochoa, MD;  Location: [facility] OR;  Service: Thoracic;  Laterality: N/A;   WISDOM TOOTH EXTRACTION     Social History   Socioeconomic History   Marital status: Divorced    Spouse name: Not on file   Number of children: Not on file   Years of education: Not on file   Highest education level: Not on file  Occupational History   Not on file  Tobacco Use   Smoking status: Former    Current packs/day: 0.00    Average packs/day: 0.1 packs/day for 30.0 years (3.0 ttl pk-yrs)    Types: Cigarettes    Start date: 66    Quit date: 2022    Years since quitting: 3.8    Passive exposure: Never   Smokeless tobacco: Never  Vaping Use   Vaping status: Every Day   Substances: Nicotine , Flavoring  Substance and Sexual Activity   Alcohol use: No    Alcohol/week: 0.0 standard drinks of alcohol   Drug use: Not Currently    Types: Cocaine, Marijuana    Comment: Last use cocaine & marijuana yrs ago,   Sexual activity: Not Currently    Comment: perimenopausal  Other Topics Concern   Not on file  Social History Narrative   Not on file   Social Drivers of Health   Financial Resource Strain: Not on file  Food Insecurity: Not on file  Transportation Needs: Not on file  Physical Activity: Not on file  Stress: Not on file  Social Connections: Not on file   Family History  Problem Relation Age of  Onset   Hypertension Mother    Hyperlipidemia Mother    Depression Mother    Rheum arthritis Mother    Psoriasis Mother    Hypertension Father    Hyperlipidemia Father    Heart disease Father    Diabetes Father    Kidney disease Father    Prostate cancer Father    Stroke Father    Kidney disease Sister    Bipolar disorder Sister    Varicose Veins Brother    Stroke Maternal Grandmother    Anxiety disorder Daughter    Anxiety disorder Daughter    Cancer Maternal Aunt    Allergies  Allergen Reactions   Ranolazine  Nausea Only and Other (See Comments)    Dizziness   Current Outpatient Medications  Medication Sig Dispense Refill   aspirin  EC 81 MG EC tablet Take 1 tablet (81  mg total) by mouth daily.     Belimumab  (BENLYSTA  IV) Inject 10 mg/kg into the vein every 28 (twenty-eight) days. [facility]     budesonide (PULMICORT) 0.5 MG/2ML nebulizer solution Apply 1 respule to 200cc saline irrigation and irrigate half into each nasal cavity once daily (Patient taking differently: as needed.)     CAPLYTA 42 MG capsule Take 42 mg by mouth at bedtime.     clobetasol  ointment (TEMOVATE ) 0.05 % Apply 1 Application topically daily as needed (rash). 30 g 6   clonazePAM (KLONOPIN) 0.5 MG tablet Take 0.5 mg by mouth as needed.     cyclobenzaprine  (FLEXERIL ) 10 MG tablet Take 1 or 2 times daily as needed for muscle spasm and muscle pain (Patient taking differently: as needed. Take 1 or 2 times daily as needed for muscle spasm and muscle pain) 60 tablet 0   doxepin  (SINEQUAN ) 25 MG capsule Take 25-50 mg by mouth at bedtime. (Patient taking differently: Take 25-50 mg by mouth as needed.)  2   DYANAVEL XR 20 MG TBCR Take 1 tablet by mouth every morning.     escitalopram  (LEXAPRO ) 20 MG tablet Take 20 mg by mouth daily.  3   fluticasone  (FLONASE ) 50 MCG/ACT nasal spray 1 spray Once Daily for 90 days. (Patient taking differently: as needed.)     furosemide  (LASIX ) 40 MG tablet TAKE 1 TABLET (40  MG TOTAL) BY MOUTH DAILY. TAKE EXTRA TAB AS NEEDED (Patient taking differently: Take 40 mg by mouth as needed. Take extra tab as needed) 90 tablet 0   hydroxychloroquine  (PLAQUENIL ) 200 MG tablet Take 1 tablet (200 mg total) by mouth daily. 90 tablet 0   ondansetron  (ZOFRAN -ODT) 4 MG disintegrating tablet TAKE 1 TABLET BY MOUTH EVERY 8 HOURS AS NEEDED FOR NAUSEA AND VOMITING 20 tablet 0   Oxycodone  HCl 10 MG TABS Take 1 tablet (10 mg total) by mouth 3 (three) times daily as needed. 90 tablet 0   pantoprazole  (PROTONIX ) 40 MG tablet TAKE 1 TABLET BY MOUTH EVERY DAY 90 tablet 0   VRAYLAR 3 MG capsule Take 3 mg by mouth daily.     No current facility-administered medications for this visit.   No results found.  Review of Systems:   A ROS was performed including pertinent positives and negatives as documented in the HPI.  Physical Exam :   Constitutional: NAD and appears stated age Neurological: Alert and oriented Psych: Appropriate affect and cooperative There were no vitals taken for this visit.   Comprehensive Musculoskeletal Exam:    Left knee with tenderness about the lateral tibiofemoral joint space with positive McMurray.  Minimal mechanical symptoms on full extension to -3 degrees with flexion to 125 negative Lachman negative posterior drawer no varus valgus laxity distal neurosensory exams intact   Imaging:   Xray (4 views left knee): Normal  MRI (left knee): Lateral meniscal tear with a flap anteriorly   I personally reviewed and interpreted the radiographs.   Assessment and Plan:   48 y.o. female presents with a lateral meniscal injury.  At today's visit I discussed treatment options.  Overall I do believe that she would benefit from a lateral meniscal debridement although this time she is not having significant pain and symptoms.  She would hope to defer this.  I did discuss that she may follow back up with me should she want to pursue additional knee arthroscopy and  debridement.   I personally saw and evaluated the patient, and participated  in the management and treatment plan.  Elspeth Parker, MD Attending Physician, Orthopedic Surgery  This document was dictated using Dragon voice recognition software. A reasonable attempt at proof reading has been made to minimize errors.

## 2024-11-14 ENCOUNTER — Encounter: Admitting: Physical Medicine and Rehabilitation

## 2024-11-14 ENCOUNTER — Encounter: Payer: Self-pay | Admitting: Physical Medicine and Rehabilitation

## 2024-11-14 VITALS — BP 110/75 | HR 96 | Ht 61.0 in | Wt 158.0 lb

## 2024-11-14 DIAGNOSIS — M549 Dorsalgia, unspecified: Secondary | ICD-10-CM | POA: Diagnosis not present

## 2024-11-14 DIAGNOSIS — M25562 Pain in left knee: Secondary | ICD-10-CM | POA: Diagnosis present

## 2024-11-14 DIAGNOSIS — G894 Chronic pain syndrome: Secondary | ICD-10-CM | POA: Insufficient documentation

## 2024-11-14 DIAGNOSIS — M47816 Spondylosis without myelopathy or radiculopathy, lumbar region: Secondary | ICD-10-CM | POA: Insufficient documentation

## 2024-11-14 DIAGNOSIS — G8929 Other chronic pain: Secondary | ICD-10-CM | POA: Diagnosis present

## 2024-11-14 MED ORDER — OXYCODONE HCL 10 MG PO TABS: 10.0000 mg | ORAL_TABLET | Freq: Three times a day (TID) | ORAL | 0 refills | Status: DC | PRN

## 2024-11-14 MED ORDER — CAPSAICIN-CLEANSING GEL 8 % EX KIT
2.0000 | PACK | Freq: Once | CUTANEOUS | Status: AC
  Administered 2024-11-14: 2 via TOPICAL

## 2024-11-14 NOTE — Progress Notes (Signed)
-  Discussed Qutenza as an option for neuropathic pain control. Discussed that this is a capsaicin patch, stronger than capsaicin cream. Discussed that it is currently approved for diabetic peripheral neuropathy and post-herpetic neuralgia, but that it has also shown benefit in treating other forms of neuropathy. Provided patient with link to site to learn more about the patch: https://www.clark.biz/. Discussed that the patch would be placed in office and benefits usually last 3 months. Discussed that unintended exposure to capsaicin can cause severe irritation of eyes, mucous membranes, respiratory tract, and skin, but that Qutenza is a local treatment and does not have the systemic side effects of other nerve medications. Discussed that there may be pain, itching, erythema, and decreased sensory function associated with the application of Qutenza. Side effects usually subside within 1 week. A cold pack of analgesic medications can help with these side effects. Blood pressure can also be increased due to pain associated with administration of the patch.   2 patches of Qutenza [phone]) was applied to bilateral lumbar spine. Ice packs were applied during the procedure to ensure patient comfort. Blood pressure was monitored every 15 minutes. The patient tolerated the procedure well. Post-procedure instructions were given and follow-up has been scheduled.  Topical system measures 14cm x20cm (280cm for a total 1120units) were applied which will cause deeper penetration for destruction of the peripheral nerve using a chemical (Qutenza) which infuses into the skin like an injection and heat technique (occlusive, compressive dressing cauing endothermic heat technique)

## 2024-11-14 NOTE — Addendum Note (Signed)
 Addended by: LORILEE SVEN SQUIBB on: 11/14/2024 10:13 AM   Modules accepted: Orders

## 2024-11-18 ENCOUNTER — Telehealth: Payer: Self-pay | Admitting: Pharmacist

## 2024-11-18 NOTE — Telephone Encounter (Signed)
 Received fax from [facility] [phone]) [facility] regarding Benlysta  (belimumab ) IV [phone]) infusion that patient received on 11/16/2024. Provider: Dr. Lonni Ester Diagnosis: systemic lupus erythematosus (SLE) Dose: 710mg  Labs drawn: No;   Patient tolerated infusion without complications.  Changes/concerns since last visit: No  Next Benlysta  (belimumab ) IV ([id number]) infusion scheduled for 12/14/2024

## 2024-12-14 NOTE — Progress Notes (Signed)
 "  Subjective:    Patient ID: Christina Morrison, female    DOB: [birth year], 48 y.o.   MRN: [id number]  HPI: Christina Morrison is a 48 y.o. female who returns for follow up appointment for chronic pain and medication refill. She states her pain is located in her mid- lower back radiating into her bilateral lower extremities L>R. Also reports left knee pain. She rates her pain 9. Her current exercise regime is walking and performing stretching exercises.  [name] Morphine  equivalent is 45.00 MME. She  is also prescribed Clonazepam  by Idell Remington  .We have discussed the black box warning of using opioids and benzodiazepines. I highlighted the dangers of using these drugs together and discussed the adverse events including respiratory suppression, overdose, cognitive impairment and importance of compliance with current regimen. We will continue to monitor and adjust as indicated.  she is being closely monitored and under the care of her psychiatrist.    Oral Swab was Performed today.      Pain Inventory Average Pain 9 Pain Right Now 9 My pain is sharp, burning, dull, stabbing, tingling, and aching  In the last 24 hours, has pain interfered with the following? General activity 4 Relation with others 4 Enjoyment of life 5 What TIME of day is your pain at its worst? morning , daytime, evening, and night Sleep (in general) Poor  Pain is worse with: walking, sitting, standing, and some activites Pain improves with: medication Relief from Meds: 5  Family History  Problem Relation Age of Onset   Hypertension Mother    Hyperlipidemia Mother    Depression Mother    Rheum arthritis Mother    Psoriasis Mother    Hypertension Father    Hyperlipidemia Father    Heart disease Father    Diabetes Father    Kidney disease Father    Prostate cancer Father    Stroke Father    Kidney disease Sister    Bipolar disorder Sister    Varicose Veins Brother    Stroke Maternal Grandmother    Anxiety disorder  Daughter    Anxiety disorder Daughter    Cancer Maternal Aunt    Social History   Socioeconomic History   Marital status: Divorced    Spouse name: Not on file   Number of children: Not on file   Years of education: Not on file   Highest education level: Not on file  Occupational History   Not on file  Tobacco Use   Smoking status: Former    Current packs/day: 0.00    Average packs/day: 0.1 packs/day for 30.0 years (3.0 ttl pk-yrs)    Types: Cigarettes    Start date: 66    Quit date: 2022    Years since quitting: 4.0    Passive exposure: Never   Smokeless tobacco: Never  Vaping Use   Vaping status: Every Day   Substances: Nicotine , Flavoring  Substance and Sexual Activity   Alcohol use: No    Alcohol/week: 0.0 standard drinks of alcohol   Drug use: Not Currently    Types: Cocaine, Marijuana    Comment: Last use cocaine & marijuana yrs ago,   Sexual activity: Not Currently    Comment: perimenopausal  Other Topics Concern   Not on file  Social History Narrative   Not on file   Social Drivers of Health   Tobacco Use: Medium Risk (11/14/2024)   Patient History    Smoking Tobacco Use: Former    Smokeless Tobacco  Use: Never    Passive Exposure: Never  Physicist, Medical Strain: Not on file  Food Insecurity: Not on file  Transportation Needs: Not on file  Physical Activity: Not on file  Stress: Not on file  Social Connections: Not on file  Depression (PHQ2-9): Low Risk (11/14/2024)   Depression (PHQ2-9)    PHQ-2 Score: 2  Alcohol Screen: Not on file  Housing: Not on file  Utilities: Not on file  Health Literacy: Not on file   Past Surgical History:  Procedure Laterality Date   CARDIAC CATHETERIZATION  03/21/2015   Procedure: CORONARY STENT INTERVENTION;  Surgeon: Ozell Fell, MD;  Location: [facility] CATH LAB;  Service: Cardiovascular;;  diag bms 2.75 x 12 vision   CARDIAC CATHETERIZATION N/A 01/14/2016   Procedure: Coronary Stent Intervention;  Surgeon: Ozell Fell, MD;  Location: [facility] INVASIVE CV LAB;  Service: Cardiovascular;  Laterality: N/A;   CARDIAC CATHETERIZATION N/A 10/31/2016   Procedure: Left Heart Cath and Coronary Angiography;  Surgeon: Lonni Hanson, MD;  Location: [facility] INVASIVE CV LAB;  Service: Cardiovascular;  Laterality: N/A;   CARDIAC CATHETERIZATION N/A 10/31/2016   Procedure: Intravascular Ultrasound/IVUS;  Surgeon: Lonni Hanson, MD;  Location: [facility] INVASIVE CV LAB;  Service: Cardiovascular;  Laterality: N/A;   CORONARY ARTERY BYPASS GRAFT N/A 11/03/2016   Procedure: CORONARY ARTERY BYPASS GRAFTING (CABG) x  three, using left internal mammary artery and right leg greater saphenous vein harvested endoscopically;  Surgeon: Sudie VEAR Laine, MD;  Location: [facility] OR;  Service: Open Heart Surgery;  Laterality: N/A;   CORONARY ARTERY BYPASS GRAFT N/A 11/03/2016   Procedure: EMERGENCY REDO STERNOTOMY IN ICU, REDO CABG X 1, PLACEMENT OF BILATERAL VAD, PLACEMENT OF LEFT FEMORAL ARTERIAL LINE;  Surgeon: Sudie VEAR Laine, MD;  Location: [facility] OR;  Service: Open Heart Surgery;  Laterality: N/A;   CORONARY STENT PLACEMENT  03/21/2015   first diagonal   ESOPHAGOGASTRODUODENOSCOPY (EGD) WITH PROPOFOL  N/A 06/11/2015   Procedure: ESOPHAGOGASTRODUODENOSCOPY (EGD) WITH PROPOFOL ;  Surgeon: Lamar Bunk, MD;  Location: [facility] ENDOSCOPY;  Service: Endoscopy;  Laterality: N/A;   HEMATOMA EVACUATION Left 11/07/2016   Procedure: EVACUATION HEMATOMA;  Surgeon: Maude Fleeta Ochoa, MD;  Location: [facility] OR;  Service: Thoracic;  Laterality: Left;   LEFT HEART CATHETERIZATION WITH CORONARY ANGIOGRAM N/A 03/21/2015   Procedure: LEFT HEART CATHETERIZATION WITH CORONARY ANGIOGRAM;  Surgeon: Ozell Fell, MD;  Location: [facility] CATH LAB;  Service: Cardiovascular;  Laterality: N/A;   LEFT HEART CATHETERIZATION WITH CORONARY ANGIOGRAM N/A 04/11/2015   Procedure: LEFT HEART CATHETERIZATION WITH CORONARY ANGIOGRAM;  Surgeon: Lonni JONETTA Cash, MD;  Location: [facility] CATH LAB;  Service:  Cardiovascular;  Laterality: N/A;   MULTIPLE TOOTH EXTRACTIONS     NOSE SURGERY  08/21/2023   REMOVAL OF CENTRIMAG VENTRICULAR ASSIST DEVICE N/A 11/07/2016   Procedure: REMOVAL OF CENTRIMAG VENTRICULAR ASSIST DEVICE;  Surgeon: Maude Fleeta Ochoa, MD;  Location: [facility] OR;  Service: Open Heart Surgery;  Laterality: N/A;   STERNAL CLOSURE N/A 11/07/2016   Procedure: STERNAL WASHOUT WITH STERNAL CLOSURE;  Surgeon: Maude Fleeta Ochoa, MD;  Location: [facility] OR;  Service: Thoracic;  Laterality: N/A;   TEE WITHOUT CARDIOVERSION N/A 11/03/2016   Procedure: TRANSESOPHAGEAL ECHOCARDIOGRAM (TEE);  Surgeon: Sudie VEAR Laine, MD;  Location: [facility] OR;  Service: Open Heart Surgery;  Laterality: N/A;   TEE WITHOUT CARDIOVERSION N/A 11/07/2016   Procedure: TRANSESOPHAGEAL ECHOCARDIOGRAM (TEE);  Surgeon: Maude Fleeta Ochoa, MD;  Location: [facility] OR;  Service: Thoracic;  Laterality: N/A;   WISDOM TOOTH EXTRACTION  Past Surgical History:  Procedure Laterality Date   CARDIAC CATHETERIZATION  03/21/2015   Procedure: CORONARY STENT INTERVENTION;  Surgeon: Ozell Fell, MD;  Location: [facility] CATH LAB;  Service: Cardiovascular;;  diag bms 2.75 x 12 vision   CARDIAC CATHETERIZATION N/A 01/14/2016   Procedure: Coronary Stent Intervention;  Surgeon: Ozell Fell, MD;  Location: [facility] INVASIVE CV LAB;  Service: Cardiovascular;  Laterality: N/A;   CARDIAC CATHETERIZATION N/A 10/31/2016   Procedure: Left Heart Cath and Coronary Angiography;  Surgeon: Lonni Hanson, MD;  Location: [facility] INVASIVE CV LAB;  Service: Cardiovascular;  Laterality: N/A;   CARDIAC CATHETERIZATION N/A 10/31/2016   Procedure: Intravascular Ultrasound/IVUS;  Surgeon: Lonni Hanson, MD;  Location: [facility] INVASIVE CV LAB;  Service: Cardiovascular;  Laterality: N/A;   CORONARY ARTERY BYPASS GRAFT N/A 11/03/2016   Procedure: CORONARY ARTERY BYPASS GRAFTING (CABG) x  three, using left internal mammary artery and right leg greater saphenous vein harvested endoscopically;  Surgeon:  Sudie VEAR Laine, MD;  Location: [facility] OR;  Service: Open Heart Surgery;  Laterality: N/A;   CORONARY ARTERY BYPASS GRAFT N/A 11/03/2016   Procedure: EMERGENCY REDO STERNOTOMY IN ICU, REDO CABG X 1, PLACEMENT OF BILATERAL VAD, PLACEMENT OF LEFT FEMORAL ARTERIAL LINE;  Surgeon: Sudie VEAR Laine, MD;  Location: [facility] OR;  Service: Open Heart Surgery;  Laterality: N/A;   CORONARY STENT PLACEMENT  03/21/2015   first diagonal   ESOPHAGOGASTRODUODENOSCOPY (EGD) WITH PROPOFOL  N/A 06/11/2015   Procedure: ESOPHAGOGASTRODUODENOSCOPY (EGD) WITH PROPOFOL ;  Surgeon: Lamar Bunk, MD;  Location: [facility] ENDOSCOPY;  Service: Endoscopy;  Laterality: N/A;   HEMATOMA EVACUATION Left 11/07/2016   Procedure: EVACUATION HEMATOMA;  Surgeon: Maude Fleeta Ochoa, MD;  Location: [facility] OR;  Service: Thoracic;  Laterality: Left;   LEFT HEART CATHETERIZATION WITH CORONARY ANGIOGRAM N/A 03/21/2015   Procedure: LEFT HEART CATHETERIZATION WITH CORONARY ANGIOGRAM;  Surgeon: Ozell Fell, MD;  Location: [facility] CATH LAB;  Service: Cardiovascular;  Laterality: N/A;   LEFT HEART CATHETERIZATION WITH CORONARY ANGIOGRAM N/A 04/11/2015   Procedure: LEFT HEART CATHETERIZATION WITH CORONARY ANGIOGRAM;  Surgeon: Lonni JONETTA Cash, MD;  Location: [facility] CATH LAB;  Service: Cardiovascular;  Laterality: N/A;   MULTIPLE TOOTH EXTRACTIONS     NOSE SURGERY  08/21/2023   REMOVAL OF CENTRIMAG VENTRICULAR ASSIST DEVICE N/A 11/07/2016   Procedure: REMOVAL OF CENTRIMAG VENTRICULAR ASSIST DEVICE;  Surgeon: Maude Fleeta Ochoa, MD;  Location: [facility] OR;  Service: Open Heart Surgery;  Laterality: N/A;   STERNAL CLOSURE N/A 11/07/2016   Procedure: STERNAL WASHOUT WITH STERNAL CLOSURE;  Surgeon: Maude Fleeta Ochoa, MD;  Location: [facility] OR;  Service: Thoracic;  Laterality: N/A;   TEE WITHOUT CARDIOVERSION N/A 11/03/2016   Procedure: TRANSESOPHAGEAL ECHOCARDIOGRAM (TEE);  Surgeon: Sudie VEAR Laine, MD;  Location: [facility] OR;  Service: Open Heart Surgery;  Laterality: N/A;   TEE WITHOUT  CARDIOVERSION N/A 11/07/2016   Procedure: TRANSESOPHAGEAL ECHOCARDIOGRAM (TEE);  Surgeon: Maude Fleeta Ochoa, MD;  Location: [facility] OR;  Service: Thoracic;  Laterality: N/A;   WISDOM TOOTH EXTRACTION     Past Medical History:  Diagnosis Date   ADHD    Anxiety    Bipolar 1 disorder (HCC)    Borderline personality disorder (HCC)    Cardiac arrest (HCC) 11/03/2016   Cardiogenic shock (HCC) 11/03/2016   Chronic pain    Cocaine use    Coronary artery disease    a. 03/2015 NSTEMI/PCI in setting of cocaine use - BMS to diagonal. b. NSTEMI 12/2015 s/o overlapping DES to Cx, residual mild RCA and LAD disease,  75% OM1.   COVID-19    Depression    Dyslipidemia    GERD (gastroesophageal reflux disease)    HLD (hyperlipidemia)    Hypertension    Migraine    MRSA (methicillin resistant staph aureus) culture positive    Osteoarthritis    Polysubstance abuse (HCC)    a. 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  Opioid Risk Score:   Fall Risk Score:  `1  Depression screen [facility] 2/9     11/14/2024    9:23 AM 08/18/2024    3:20 PM 07/19/2024    3:04 PM 06/14/2024   12:53 PM 01/24/2015    3:54 PM  Depression screen PHQ 2/9  Decreased Interest 1 1 1 3 3   Down, Depressed, Hopeless 1 1 1 2 2   PHQ - 2 Score 2 2 2 5 5   Altered sleeping   1 3 3   Tired, decreased energy   1 3 3   Change in appetite    3 1  Feeling bad or failure about yourself    0 1 2  Trouble concentrating   2 3 3   Moving slowly or fidgety/restless   0 3 2  Suicidal thoughts   0 0 1   PHQ-9 Score   6  21  20    Difficult doing work/chores   Somewhat difficult       Data saved with a previous flowsheet row definition    Review of Systems     Objective:   Physical Exam Vitals and nursing note reviewed.  Constitutional:       Appearance: Normal appearance.  Cardiovascular:     Rate and Rhythm: Normal rate and regular rhythm.  Pulmonary:     Effort: Pulmonary effort is normal.     Breath sounds: Normal breath sounds.  Musculoskeletal:     Comments: Normal Muscle Bulk and Muscle Testing Reveals:  Upper Extremities: Full ROM and Muscle Strength 5/5  Thoracic Paraspinal Tenderness: T-1-T-2   Lumbar Paraspinal Tenderness: L-4-L-5 Bilateral Greater Trochanter Tenderness Lower Extremities: Right: Full ROM and Muscle Strength 5/5 Left lower extremity: Decreased ROM and Muscle Strength 5/5 Left Lower extremity Flexion Produces Pain into her left Patella Wearing Knee brace  Arises from table slowly Antalgic  Gait     Skin:    General: Skin is warm and dry.  Neurological:     Mental Status: She is alert and oriented to person, place, and time.  Psychiatric:        Mood and Affect: Mood normal.        Behavior: Behavior normal.           Assessment & Plan:  Lumbar Radiculitis: Continue HEP as Tolerated. Continue current Medication regimen. Continue to Monitor. 12/16/2024 Chronic Pain of Left Knee Pain: Ortho following . 01/.01/2025 Fall at Home: No falls since last visit. Educated on [employer]. She verbalizes understanding. 12/16/2024 Chronic Pain Syndrome: Refilled: Oxycodone  10 mg three times a day as needed for pain #90. We will continue the opioid monitoring program, this consists of regular clinic visits, examinations, urine drug screen, pill counts as well as use of [place]  Controlled Substance Reporting system. A 12 month History has been reviewed on the   Controlled Substance  Reporting System Today.  We will continue the opioid monitoring program, this consists of regular clinic visits, examinations, urine drug screen, pill counts as well as use of [place]  Controlled Substance Reporting system. A 12 month History has been reviewed on the [place]  Controlled  Substance Reporting System on 12/16/2024 She reports no relief with Qutenza  Patches   5. Insomnia: She reports in the past when she was prescribed Trazodone made her drowsy, Nortriptyline couldn't be prescribed due to Serotonin Syndrome. She will discussed with Dr Lorilee.  F/U in 1 month     "

## 2024-12-16 ENCOUNTER — Encounter: Attending: Registered Nurse | Admitting: Registered Nurse

## 2024-12-16 VITALS — BP 137/89 | HR 93 | Ht 62.0 in | Wt 153.6 lb

## 2024-12-16 DIAGNOSIS — Z5181 Encounter for therapeutic drug level monitoring: Secondary | ICD-10-CM | POA: Diagnosis not present

## 2024-12-16 DIAGNOSIS — Z79899 Other long term (current) drug therapy: Secondary | ICD-10-CM | POA: Diagnosis not present

## 2024-12-16 DIAGNOSIS — G894 Chronic pain syndrome: Secondary | ICD-10-CM | POA: Diagnosis not present

## 2024-12-16 DIAGNOSIS — M542 Cervicalgia: Secondary | ICD-10-CM

## 2024-12-16 DIAGNOSIS — M546 Pain in thoracic spine: Secondary | ICD-10-CM | POA: Diagnosis not present

## 2024-12-16 DIAGNOSIS — M5416 Radiculopathy, lumbar region: Secondary | ICD-10-CM | POA: Insufficient documentation

## 2024-12-16 DIAGNOSIS — M25562 Pain in left knee: Secondary | ICD-10-CM | POA: Diagnosis not present

## 2024-12-16 DIAGNOSIS — G8929 Other chronic pain: Secondary | ICD-10-CM | POA: Diagnosis present

## 2024-12-16 DIAGNOSIS — G4709 Other insomnia: Secondary | ICD-10-CM | POA: Diagnosis not present

## 2024-12-16 DIAGNOSIS — M47816 Spondylosis without myelopathy or radiculopathy, lumbar region: Secondary | ICD-10-CM | POA: Insufficient documentation

## 2024-12-16 MED ORDER — OXYCODONE HCL 10 MG PO TABS: 10.0000 mg | ORAL_TABLET | Freq: Four times a day (QID) | ORAL | 0 refills | Status: DC | PRN

## 2024-12-19 ENCOUNTER — Telehealth: Payer: Self-pay | Admitting: Registered Nurse

## 2024-12-19 LAB — TOXASSURE SELECT,+ANTIDEPR,UR

## 2024-12-19 NOTE — Telephone Encounter (Signed)
 UDS results was reviewed.  UDS was reviewed, Alprazolam  noted, PDMP was reviewed.  + Alprazolam , no prescription noted.  Last script in 04/2024, was for clonazepam  Call placed to Ms. Burnard, no answer, left message to return the call.  Will forward the above message to Dr Lorilee and Evlyn Adin PEAK  She will need a warming letter

## 2024-12-20 NOTE — Telephone Encounter (Signed)
 Formal warning letter sent through MyChart.

## 2024-12-21 ENCOUNTER — Telehealth: Payer: Self-pay | Admitting: Pharmacist

## 2024-12-21 NOTE — Telephone Encounter (Signed)
 Received fax from [facility] - infusion scheduled from 17/2026 to 12/29/2024 (patient forgot about appt)  Majd Tissue, PharmD, MPH, BCPS, CPP Clinical Pharmacist

## 2024-12-22 ENCOUNTER — Telehealth (HOSPITAL_BASED_OUTPATIENT_CLINIC_OR_DEPARTMENT_OTHER): Payer: Self-pay

## 2024-12-22 ENCOUNTER — Encounter (HOSPITAL_BASED_OUTPATIENT_CLINIC_OR_DEPARTMENT_OTHER): Payer: Self-pay

## 2024-12-22 NOTE — Telephone Encounter (Signed)
 Left voicemail for patient regarding the need to reschedule due to the provider not being available in the afternoon. A MyChart message was also sent.

## 2024-12-25 ENCOUNTER — Encounter: Payer: Self-pay | Admitting: Registered Nurse

## 2024-12-28 ENCOUNTER — Encounter: Payer: Self-pay | Admitting: Internal Medicine

## 2024-12-28 ENCOUNTER — Ambulatory Visit: Attending: Internal Medicine | Admitting: Internal Medicine

## 2024-12-28 VITALS — BP 120/76 | HR 99 | Temp 98.2°F | Resp 16 | Ht 62.0 in | Wt 152.6 lb

## 2024-12-28 DIAGNOSIS — R110 Nausea: Secondary | ICD-10-CM | POA: Diagnosis present

## 2024-12-28 DIAGNOSIS — M3219 Other organ or system involvement in systemic lupus erythematosus: Secondary | ICD-10-CM | POA: Insufficient documentation

## 2024-12-28 DIAGNOSIS — Z79899 Other long term (current) drug therapy: Secondary | ICD-10-CM | POA: Diagnosis present

## 2024-12-28 DIAGNOSIS — K219 Gastro-esophageal reflux disease without esophagitis: Secondary | ICD-10-CM | POA: Diagnosis present

## 2024-12-28 MED ORDER — ONDANSETRON 4 MG PO TBDP: 4.0000 mg | ORAL_TABLET | Freq: Every day | ORAL | 1 refills | Status: AC | PRN

## 2024-12-28 MED ORDER — HYDROXYCHLOROQUINE SULFATE 200 MG PO TABS: 200.0000 mg | ORAL_TABLET | Freq: Every day | ORAL | 1 refills | Status: AC

## 2024-12-28 MED ORDER — PANTOPRAZOLE SODIUM 40 MG PO TBEC: 40.0000 mg | DELAYED_RELEASE_TABLET | Freq: Every day | ORAL | 1 refills | Status: AC

## 2024-12-30 LAB — COMPREHENSIVE METABOLIC PANEL WITH GFR
AG Ratio: 1.6 (calc) (ref 1.0–2.5)
ALT: 7 U/L (ref 6–29)
AST: 11 U/L (ref 10–35)
Albumin: 4.2 g/dL (ref 3.6–5.1)
Alkaline phosphatase (APISO): 71 U/L (ref 31–125)
BUN: 7 mg/dL (ref 7–25)
CO2: 29 mmol/L (ref 20–32)
Calcium: 8.9 mg/dL (ref 8.6–10.2)
Chloride: 102 mmol/L (ref 98–110)
Creat: 0.69 mg/dL (ref 0.50–0.99)
Globulin: 2.6 g/dL (ref 1.9–3.7)
Glucose, Bld: 160 mg/dL — ABNORMAL HIGH (ref 65–99)
Potassium: 4.1 mmol/L (ref 3.5–5.3)
Sodium: 139 mmol/L (ref 135–146)
Total Bilirubin: 0.3 mg/dL (ref 0.2–1.2)
Total Protein: 6.8 g/dL (ref 6.1–8.1)
eGFR: 107 mL/min/1.73m2

## 2024-12-30 LAB — CBC WITH DIFFERENTIAL/PLATELET
Absolute Lymphocytes: 1172 {cells}/uL (ref 850–3900)
Absolute Monocytes: 285 {cells}/uL (ref 200–950)
Basophils Absolute: 19 {cells}/uL (ref 0–200)
Basophils Relative: 0.3 %
Eosinophils Absolute: 372 {cells}/uL (ref 15–500)
Eosinophils Relative: 6 %
HCT: 38.9 % (ref 35.9–46.0)
Hemoglobin: 12.7 g/dL (ref 11.7–15.5)
MCH: 27.7 pg (ref 27.0–33.0)
MCHC: 32.6 g/dL (ref 31.6–35.4)
MCV: 84.9 fL (ref 81.4–101.7)
MPV: 9.6 fL (ref 7.5–12.5)
Monocytes Relative: 4.6 %
Neutro Abs: 4352 {cells}/uL (ref 1500–7800)
Neutrophils Relative %: 70.2 %
Platelets: 265 Thousand/uL (ref 140–400)
RBC: 4.58 Million/uL (ref 3.80–5.10)
RDW: 12.5 % (ref 11.0–15.0)
Total Lymphocyte: 18.9 %
WBC: 6.2 Thousand/uL (ref 3.8–10.8)

## 2024-12-30 LAB — SEDIMENTATION RATE: Sed Rate: 19 mm/h (ref 0–20)

## 2024-12-30 LAB — C3 AND C4
C3 Complement: 129 mg/dL (ref 83–193)
C4 Complement: 30 mg/dL (ref 15–57)

## 2024-12-30 LAB — ANTI-DNA ANTIBODY, DOUBLE-STRANDED: ds DNA Ab: 1 [IU]/mL

## 2025-01-02 ENCOUNTER — Telehealth: Payer: Self-pay | Admitting: Pharmacist

## 2025-01-02 NOTE — Telephone Encounter (Signed)
 Received fax from [facility] [phone]) [facility] regarding Benlysta  (belimumab ) IV [phone]) infusion that patient received on 12/29/2024. Provider: Dr. Lonni Ester Diagnosis: systemic lupus erythematosus (SLE) Dose: 10mg /kg Labs drawn: Yes; CBC/CMP every 3 months, C3/C4 complement every 3 months, anti-dsDNA antibody every 3 months, ESR every 3 months, and urine protein:creatinine ratio  Patient tolerated infusion without complications.  Changes/concerns since last visit: No  Next Benlysta  (belimumab ) IV ([id number]) infusion scheduled for 02/01/2025  Sherry Pennant, PharmD, MPH, BCPS, CPP Clinical Pharmacist

## 2025-01-16 ENCOUNTER — Encounter: Admitting: Registered Nurse

## 2025-01-18 ENCOUNTER — Telehealth: Payer: Self-pay | Admitting: Registered Nurse

## 2025-01-18 ENCOUNTER — Telehealth: Payer: Self-pay | Admitting: *Deleted

## 2025-01-18 DIAGNOSIS — G8929 Other chronic pain: Secondary | ICD-10-CM

## 2025-01-18 DIAGNOSIS — M47816 Spondylosis without myelopathy or radiculopathy, lumbar region: Secondary | ICD-10-CM

## 2025-01-18 DIAGNOSIS — G894 Chronic pain syndrome: Secondary | ICD-10-CM

## 2025-01-18 MED ORDER — OXYCODONE HCL 10 MG PO TABS: 10.0000 mg | ORAL_TABLET | Freq: Four times a day (QID) | ORAL | 0 refills | Status: AC | PRN
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
## 2025-01-18 NOTE — Telephone Encounter (Signed)
 Yes, prior authorization

## 2025-01-18 NOTE — Telephone Encounter (Signed)
 [place] DEL from [employer] was on call with pt Christina Morrison. Pt went to pick her meds oxycodone  tabs, it was rejected and they need PA urgently to process her meds. The previous PA expired 12/22/24.

## 2025-01-19 ENCOUNTER — Telehealth: Payer: Self-pay

## 2025-01-19 NOTE — Telephone Encounter (Signed)
(  Key: [id number]) PA Case ID #: [id number] Need Help? Call us  at [phone] Status sent iconSent to Plan today Drug oxyCODONE  HCl 10MG  tablets ePA cloud logo Form

## 2025-01-19 NOTE — Telephone Encounter (Signed)
 Approved, see previous message.

## 2025-01-19 NOTE — Telephone Encounter (Signed)
 Outcome Approved today by [employer] 2017 NCPDP Request Reference Number: [id number]. OXYCODONE  TAB 10MG  is approved through 07/19/2025. For further questions, call [employer] at [phone]. Effective Date: 01/19/2025 Authorization Expiration Date: 07/19/2025  Message sent to Ms Roston.

## 2025-02-13 ENCOUNTER — Encounter (HOSPITAL_BASED_OUTPATIENT_CLINIC_OR_DEPARTMENT_OTHER): Admitting: Obstetrics and Gynecology

## 2025-02-14 ENCOUNTER — Encounter: Admitting: Physical Medicine and Rehabilitation

## 2025-03-28 ENCOUNTER — Ambulatory Visit: Admitting: Internal Medicine
